# Patient Record
Sex: Female | Born: 1940
Health system: Southern US, Community
[De-identification: ages and names within clinical notes are randomized; demographics above are authoritative.]

## PROBLEM LIST (undated history)

## (undated) DIAGNOSIS — I700 Atherosclerosis of aorta: Secondary | ICD-10-CM

## (undated) DIAGNOSIS — N10 Acute tubulo-interstitial nephritis: Secondary | ICD-10-CM

## (undated) DIAGNOSIS — D649 Anemia, unspecified: Secondary | ICD-10-CM

## (undated) DIAGNOSIS — R161 Splenomegaly, not elsewhere classified: Secondary | ICD-10-CM

## (undated) DIAGNOSIS — I10 Essential (primary) hypertension: Secondary | ICD-10-CM

## (undated) DIAGNOSIS — Z79899 Other long term (current) drug therapy: Secondary | ICD-10-CM

## (undated) DIAGNOSIS — I493 Ventricular premature depolarization: Secondary | ICD-10-CM

## (undated) DIAGNOSIS — C801 Malignant (primary) neoplasm, unspecified: Secondary | ICD-10-CM

## (undated) DIAGNOSIS — K589 Irritable bowel syndrome without diarrhea: Secondary | ICD-10-CM

## (undated) DIAGNOSIS — I2510 Atherosclerotic heart disease of native coronary artery without angina pectoris: Secondary | ICD-10-CM

## (undated) DIAGNOSIS — I7123 Aneurysm of the descending thoracic aorta, without rupture: Secondary | ICD-10-CM

## (undated) DIAGNOSIS — E559 Vitamin D deficiency, unspecified: Secondary | ICD-10-CM

## (undated) DIAGNOSIS — I4891 Unspecified atrial fibrillation: Secondary | ICD-10-CM

## (undated) DIAGNOSIS — F329 Major depressive disorder, single episode, unspecified: Secondary | ICD-10-CM

## (undated) DIAGNOSIS — F32A Depression, unspecified: Secondary | ICD-10-CM

## (undated) DIAGNOSIS — M8580 Other specified disorders of bone density and structure, unspecified site: Secondary | ICD-10-CM

## (undated) DIAGNOSIS — E785 Hyperlipidemia, unspecified: Secondary | ICD-10-CM

## (undated) DIAGNOSIS — N200 Calculus of kidney: Secondary | ICD-10-CM

## (undated) DIAGNOSIS — M353 Polymyalgia rheumatica: Secondary | ICD-10-CM

## (undated) DIAGNOSIS — E039 Hypothyroidism, unspecified: Secondary | ICD-10-CM

## (undated) DIAGNOSIS — E876 Hypokalemia: Secondary | ICD-10-CM

## (undated) DIAGNOSIS — K760 Fatty (change of) liver, not elsewhere classified: Secondary | ICD-10-CM

## (undated) DIAGNOSIS — K219 Gastro-esophageal reflux disease without esophagitis: Secondary | ICD-10-CM

## (undated) DIAGNOSIS — M316 Other giant cell arteritis: Secondary | ICD-10-CM

## (undated) DIAGNOSIS — Z7901 Long term (current) use of anticoagulants: Secondary | ICD-10-CM

## (undated) DIAGNOSIS — C4490 Unspecified malignant neoplasm of skin, unspecified: Secondary | ICD-10-CM

## (undated) HISTORY — PX: COLONOSCOPY: SHX174

## (undated) HISTORY — DX: Essential (primary) hypertension: I10

## (undated) HISTORY — DX: Major depressive disorder, single episode, unspecified: F32.9

## (undated) HISTORY — DX: Hyperlipidemia, unspecified: E78.5

## (undated) HISTORY — PX: EYE SURGERY: SHX253

## (undated) HISTORY — DX: Gastro-esophageal reflux disease without esophagitis: K21.9

## (undated) HISTORY — PX: CATARACT EXTRACTION: SUR2

## (undated) HISTORY — DX: Depression, unspecified: F32.A

## (undated) HISTORY — PX: TONSILLECTOMY: SUR1361

---
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
## 1968-12-27 HISTORY — PX: WISDOM TOOTH EXTRACTION: SHX21

## 1971-03-28 HISTORY — PX: ABDOMINAL HYSTERECTOMY: SHX81

## 2003-08-14 LAB — HM PAP SMEAR: HM Pap smear: NORMAL

## 2006-09-01 ENCOUNTER — Ambulatory Visit: Payer: Self-pay | Admitting: Family Medicine

## 2006-11-02 ENCOUNTER — Ambulatory Visit: Payer: Self-pay | Admitting: Family Medicine

## 2006-11-08 ENCOUNTER — Ambulatory Visit: Payer: Self-pay | Admitting: Family Medicine

## 2008-02-06 ENCOUNTER — Ambulatory Visit: Payer: Self-pay | Admitting: Ophthalmology

## 2008-09-05 ENCOUNTER — Ambulatory Visit: Payer: Self-pay | Admitting: Family Medicine

## 2009-01-15 LAB — HM DEXA SCAN

## 2009-01-23 ENCOUNTER — Ambulatory Visit: Payer: Self-pay | Admitting: Family Medicine

## 2009-02-04 ENCOUNTER — Ambulatory Visit: Payer: Self-pay | Admitting: Gastroenterology

## 2009-02-04 LAB — HM COLONOSCOPY: HM Colonoscopy: NORMAL

## 2009-02-06 ENCOUNTER — Ambulatory Visit: Payer: Self-pay | Admitting: Family Medicine

## 2009-09-25 ENCOUNTER — Ambulatory Visit: Payer: Self-pay | Admitting: Family Medicine

## 2009-09-25 LAB — HM MAMMOGRAPHY

## 2009-11-25 ENCOUNTER — Ambulatory Visit: Payer: Self-pay | Admitting: Ophthalmology

## 2009-12-09 ENCOUNTER — Ambulatory Visit: Payer: Self-pay | Admitting: Ophthalmology

## 2011-02-09 ENCOUNTER — Ambulatory Visit: Payer: Self-pay | Admitting: Family Medicine

## 2012-03-08 ENCOUNTER — Ambulatory Visit: Payer: Self-pay | Admitting: Family Medicine

## 2012-03-08 DIAGNOSIS — E559 Vitamin D deficiency, unspecified: Secondary | ICD-10-CM | POA: Diagnosis not present

## 2012-03-08 DIAGNOSIS — K2100 Gastro-esophageal reflux disease with esophagitis, without bleeding: Secondary | ICD-10-CM | POA: Diagnosis not present

## 2012-03-08 DIAGNOSIS — M5430 Sciatica, unspecified side: Secondary | ICD-10-CM | POA: Diagnosis not present

## 2012-03-08 DIAGNOSIS — M549 Dorsalgia, unspecified: Secondary | ICD-10-CM | POA: Diagnosis not present

## 2012-03-08 DIAGNOSIS — M5137 Other intervertebral disc degeneration, lumbosacral region: Secondary | ICD-10-CM | POA: Diagnosis not present

## 2012-03-16 ENCOUNTER — Ambulatory Visit: Payer: Self-pay | Admitting: Family Medicine

## 2012-03-16 DIAGNOSIS — I4891 Unspecified atrial fibrillation: Secondary | ICD-10-CM | POA: Diagnosis not present

## 2012-03-16 DIAGNOSIS — M25559 Pain in unspecified hip: Secondary | ICD-10-CM | POA: Diagnosis not present

## 2012-03-16 DIAGNOSIS — M549 Dorsalgia, unspecified: Secondary | ICD-10-CM | POA: Diagnosis not present

## 2012-03-16 DIAGNOSIS — K2100 Gastro-esophageal reflux disease with esophagitis, without bleeding: Secondary | ICD-10-CM | POA: Diagnosis not present

## 2012-03-16 DIAGNOSIS — E559 Vitamin D deficiency, unspecified: Secondary | ICD-10-CM | POA: Diagnosis not present

## 2012-03-29 DIAGNOSIS — R1013 Epigastric pain: Secondary | ICD-10-CM | POA: Diagnosis not present

## 2012-03-29 DIAGNOSIS — I059 Rheumatic mitral valve disease, unspecified: Secondary | ICD-10-CM | POA: Diagnosis not present

## 2012-03-29 DIAGNOSIS — I4891 Unspecified atrial fibrillation: Secondary | ICD-10-CM | POA: Diagnosis not present

## 2012-03-29 DIAGNOSIS — K3189 Other diseases of stomach and duodenum: Secondary | ICD-10-CM | POA: Diagnosis not present

## 2012-04-03 DIAGNOSIS — R5383 Other fatigue: Secondary | ICD-10-CM | POA: Diagnosis not present

## 2012-04-03 DIAGNOSIS — R5381 Other malaise: Secondary | ICD-10-CM | POA: Diagnosis not present

## 2012-04-03 DIAGNOSIS — I4891 Unspecified atrial fibrillation: Secondary | ICD-10-CM | POA: Diagnosis not present

## 2012-04-03 DIAGNOSIS — I059 Rheumatic mitral valve disease, unspecified: Secondary | ICD-10-CM | POA: Diagnosis not present

## 2012-05-04 DIAGNOSIS — K3189 Other diseases of stomach and duodenum: Secondary | ICD-10-CM | POA: Diagnosis not present

## 2012-05-04 DIAGNOSIS — R1013 Epigastric pain: Secondary | ICD-10-CM | POA: Diagnosis not present

## 2012-05-04 DIAGNOSIS — R5383 Other fatigue: Secondary | ICD-10-CM | POA: Diagnosis not present

## 2012-05-04 DIAGNOSIS — I4891 Unspecified atrial fibrillation: Secondary | ICD-10-CM | POA: Diagnosis not present

## 2012-05-04 DIAGNOSIS — R5381 Other malaise: Secondary | ICD-10-CM | POA: Diagnosis not present

## 2012-05-04 DIAGNOSIS — E782 Mixed hyperlipidemia: Secondary | ICD-10-CM | POA: Diagnosis not present

## 2012-06-07 DIAGNOSIS — E782 Mixed hyperlipidemia: Secondary | ICD-10-CM | POA: Diagnosis not present

## 2012-06-07 DIAGNOSIS — I4891 Unspecified atrial fibrillation: Secondary | ICD-10-CM | POA: Diagnosis not present

## 2012-06-07 DIAGNOSIS — I059 Rheumatic mitral valve disease, unspecified: Secondary | ICD-10-CM | POA: Diagnosis not present

## 2012-09-07 DIAGNOSIS — D1801 Hemangioma of skin and subcutaneous tissue: Secondary | ICD-10-CM | POA: Diagnosis not present

## 2012-09-07 DIAGNOSIS — L570 Actinic keratosis: Secondary | ICD-10-CM | POA: Diagnosis not present

## 2012-10-04 DIAGNOSIS — E785 Hyperlipidemia, unspecified: Secondary | ICD-10-CM | POA: Diagnosis not present

## 2012-10-04 DIAGNOSIS — I4891 Unspecified atrial fibrillation: Secondary | ICD-10-CM | POA: Diagnosis not present

## 2012-11-16 DIAGNOSIS — M316 Other giant cell arteritis: Secondary | ICD-10-CM | POA: Diagnosis not present

## 2012-11-16 DIAGNOSIS — R5381 Other malaise: Secondary | ICD-10-CM | POA: Diagnosis not present

## 2012-11-16 DIAGNOSIS — Z23 Encounter for immunization: Secondary | ICD-10-CM | POA: Diagnosis not present

## 2012-11-16 DIAGNOSIS — R5383 Other fatigue: Secondary | ICD-10-CM | POA: Diagnosis not present

## 2012-11-16 DIAGNOSIS — M549 Dorsalgia, unspecified: Secondary | ICD-10-CM | POA: Diagnosis not present

## 2012-11-16 DIAGNOSIS — Z79899 Other long term (current) drug therapy: Secondary | ICD-10-CM | POA: Diagnosis not present

## 2012-11-16 DIAGNOSIS — E559 Vitamin D deficiency, unspecified: Secondary | ICD-10-CM | POA: Diagnosis not present

## 2013-01-18 DIAGNOSIS — G4700 Insomnia, unspecified: Secondary | ICD-10-CM | POA: Diagnosis not present

## 2013-01-18 DIAGNOSIS — F411 Generalized anxiety disorder: Secondary | ICD-10-CM | POA: Diagnosis not present

## 2013-01-18 DIAGNOSIS — F329 Major depressive disorder, single episode, unspecified: Secondary | ICD-10-CM | POA: Diagnosis not present

## 2013-01-18 DIAGNOSIS — E559 Vitamin D deficiency, unspecified: Secondary | ICD-10-CM | POA: Diagnosis not present

## 2013-01-18 DIAGNOSIS — F3289 Other specified depressive episodes: Secondary | ICD-10-CM | POA: Diagnosis not present

## 2013-04-04 DIAGNOSIS — R635 Abnormal weight gain: Secondary | ICD-10-CM | POA: Diagnosis not present

## 2013-04-04 DIAGNOSIS — I4891 Unspecified atrial fibrillation: Secondary | ICD-10-CM | POA: Diagnosis not present

## 2013-06-18 DIAGNOSIS — E039 Hypothyroidism, unspecified: Secondary | ICD-10-CM | POA: Diagnosis not present

## 2013-06-18 DIAGNOSIS — K2100 Gastro-esophageal reflux disease with esophagitis, without bleeding: Secondary | ICD-10-CM | POA: Diagnosis not present

## 2013-06-18 DIAGNOSIS — F3289 Other specified depressive episodes: Secondary | ICD-10-CM | POA: Diagnosis not present

## 2013-06-18 DIAGNOSIS — F329 Major depressive disorder, single episode, unspecified: Secondary | ICD-10-CM | POA: Diagnosis not present

## 2013-06-18 DIAGNOSIS — F411 Generalized anxiety disorder: Secondary | ICD-10-CM | POA: Diagnosis not present

## 2013-07-25 IMAGING — CR DG HIP COMPLETE 2+V*L*
1 series · 2 of 2 positions shown · non-contrast
Comparison: none

REASON FOR EXAM: left hip pain
COMMENTS:

PROCEDURE:     KDR - KDXR HIP LEFT COMPLETE  - March 16, 2012  [DATE]
RESULT:     Comparison:  None

[Series 1: ap · 0.17mm/px · 2 of 2 slices shown]
[im 1/2]
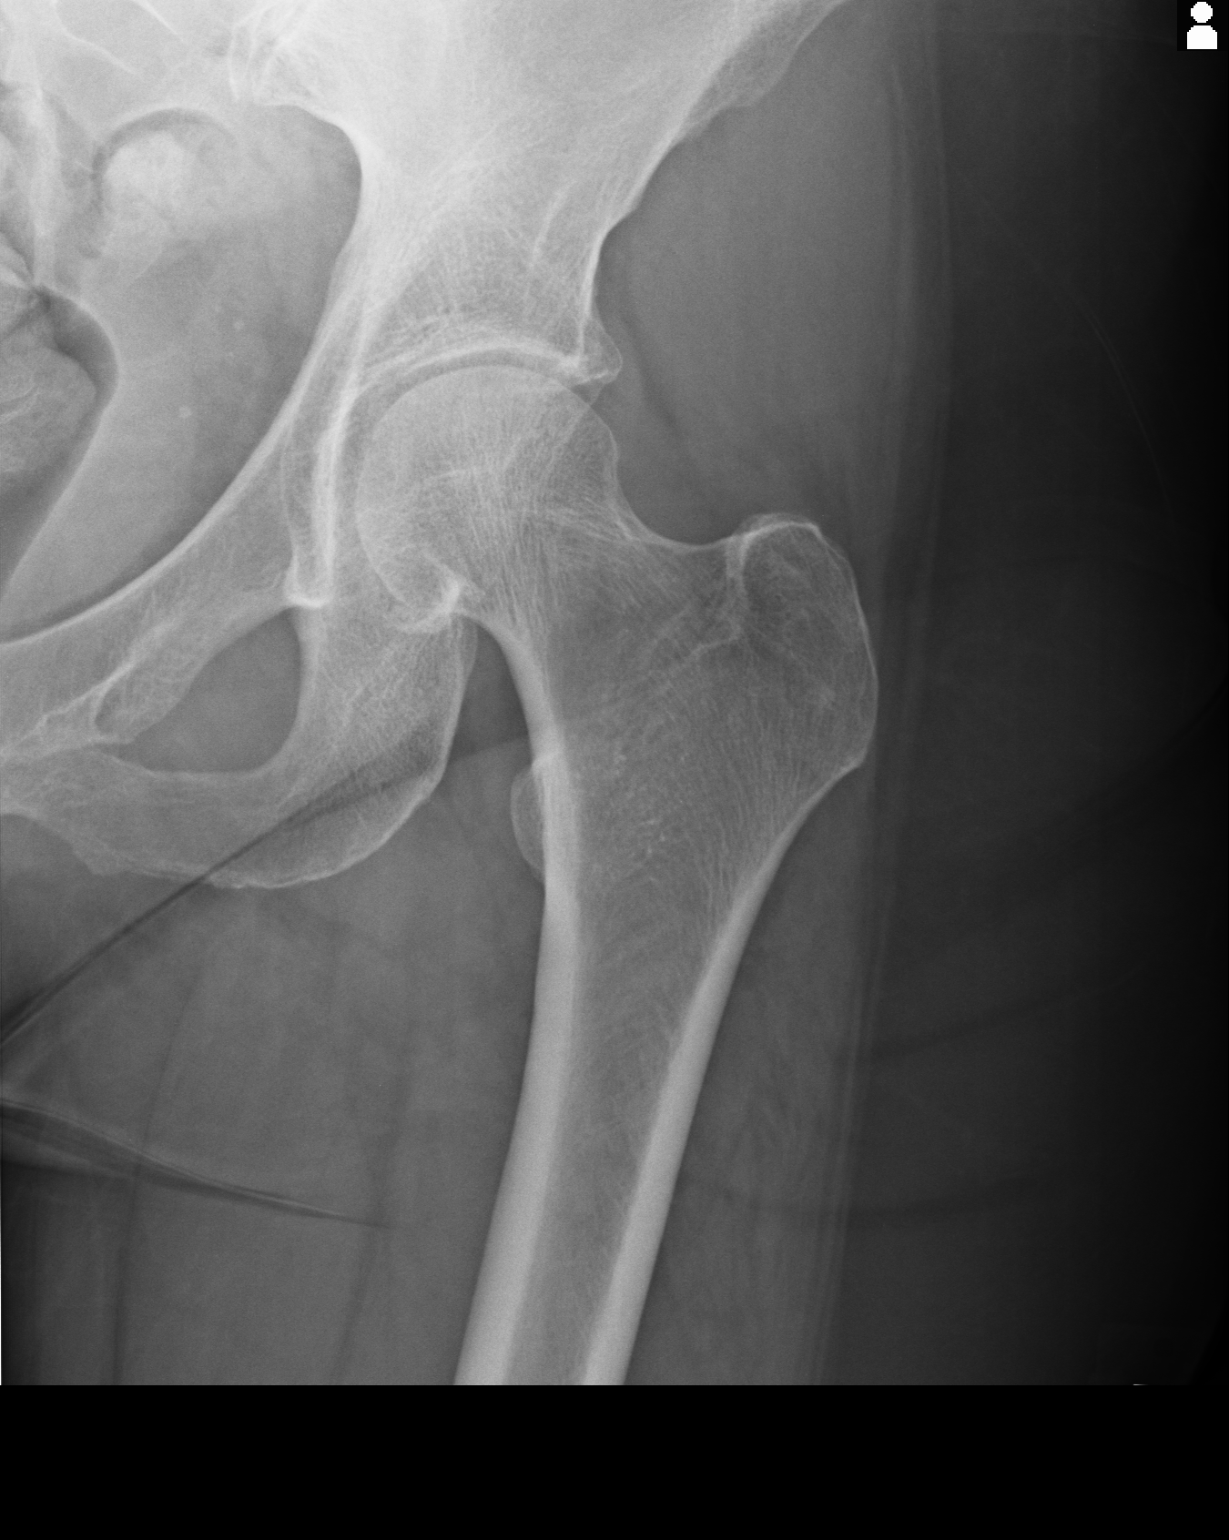
[im 2/2]
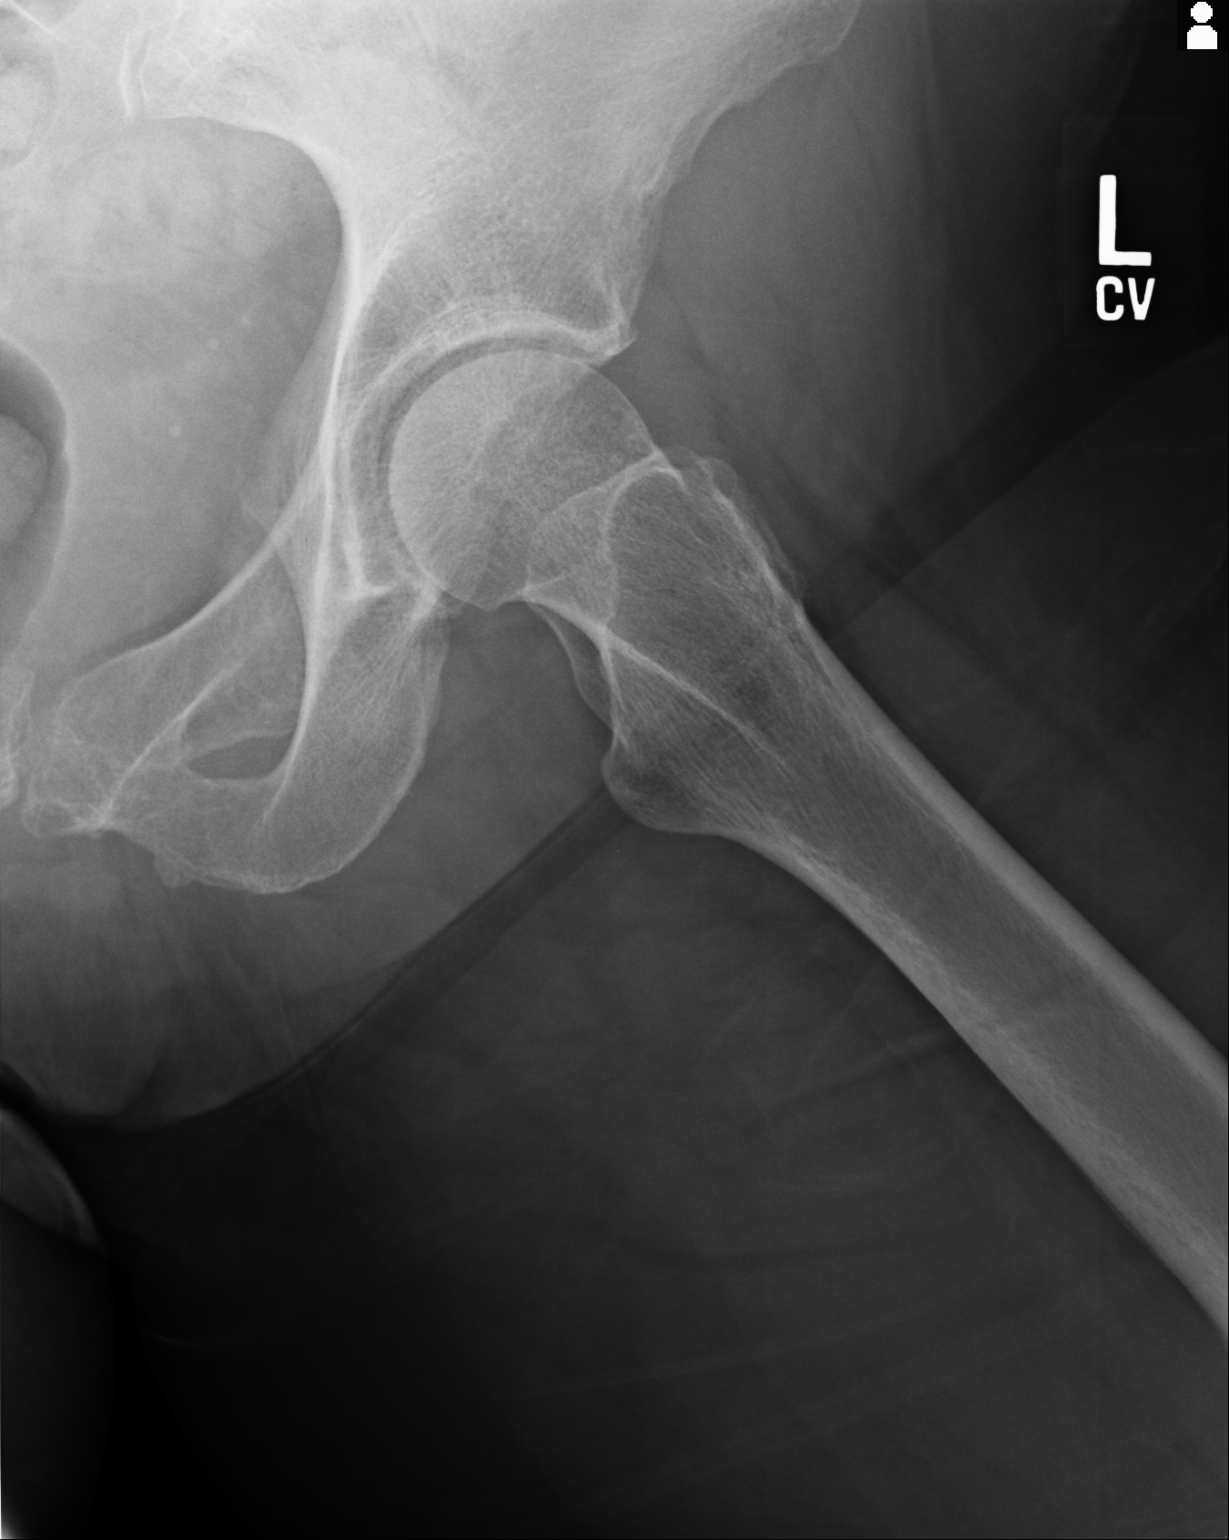

[2 of 2 positions shown; findings below may reference images not displayed]

FINDINGS: AP and frogleg lateral view of the left hip demonstrates no fracture or
dislocation. The joint space is maintained.
IMPRESSION: No acute osseous injury of the left hip.

## 2013-09-07 DIAGNOSIS — Z85828 Personal history of other malignant neoplasm of skin: Secondary | ICD-10-CM | POA: Diagnosis not present

## 2013-09-07 DIAGNOSIS — L570 Actinic keratosis: Secondary | ICD-10-CM | POA: Diagnosis not present

## 2013-09-28 DIAGNOSIS — Z23 Encounter for immunization: Secondary | ICD-10-CM | POA: Diagnosis not present

## 2013-10-30 ENCOUNTER — Ambulatory Visit: Payer: Self-pay | Admitting: Family Medicine

## 2013-10-30 DIAGNOSIS — Z1231 Encounter for screening mammogram for malignant neoplasm of breast: Secondary | ICD-10-CM | POA: Diagnosis not present

## 2013-11-21 DIAGNOSIS — E039 Hypothyroidism, unspecified: Secondary | ICD-10-CM | POA: Diagnosis not present

## 2013-11-21 DIAGNOSIS — Z0000 Encounter for general adult medical examination without abnormal findings: Secondary | ICD-10-CM | POA: Diagnosis not present

## 2013-11-21 DIAGNOSIS — Z1331 Encounter for screening for depression: Secondary | ICD-10-CM | POA: Diagnosis not present

## 2013-11-21 DIAGNOSIS — K2100 Gastro-esophageal reflux disease with esophagitis, without bleeding: Secondary | ICD-10-CM | POA: Diagnosis not present

## 2013-11-21 DIAGNOSIS — F329 Major depressive disorder, single episode, unspecified: Secondary | ICD-10-CM | POA: Diagnosis not present

## 2013-11-21 DIAGNOSIS — Z1339 Encounter for screening examination for other mental health and behavioral disorders: Secondary | ICD-10-CM | POA: Diagnosis not present

## 2013-11-21 DIAGNOSIS — F3289 Other specified depressive episodes: Secondary | ICD-10-CM | POA: Diagnosis not present

## 2014-02-07 DIAGNOSIS — E785 Hyperlipidemia, unspecified: Secondary | ICD-10-CM | POA: Diagnosis not present

## 2014-02-07 DIAGNOSIS — I4891 Unspecified atrial fibrillation: Secondary | ICD-10-CM | POA: Diagnosis not present

## 2014-02-25 DIAGNOSIS — R079 Chest pain, unspecified: Secondary | ICD-10-CM | POA: Diagnosis not present

## 2014-02-25 DIAGNOSIS — I10 Essential (primary) hypertension: Secondary | ICD-10-CM | POA: Diagnosis not present

## 2014-02-25 DIAGNOSIS — F320 Major depressive disorder, single episode, mild: Secondary | ICD-10-CM | POA: Diagnosis not present

## 2014-02-25 DIAGNOSIS — K2100 Gastro-esophageal reflux disease with esophagitis, without bleeding: Secondary | ICD-10-CM | POA: Diagnosis not present

## 2014-02-25 DIAGNOSIS — E039 Hypothyroidism, unspecified: Secondary | ICD-10-CM | POA: Diagnosis not present

## 2014-02-27 DIAGNOSIS — R5381 Other malaise: Secondary | ICD-10-CM | POA: Diagnosis not present

## 2014-02-27 DIAGNOSIS — E785 Hyperlipidemia, unspecified: Secondary | ICD-10-CM | POA: Diagnosis not present

## 2014-02-27 DIAGNOSIS — E559 Vitamin D deficiency, unspecified: Secondary | ICD-10-CM | POA: Diagnosis not present

## 2014-02-27 DIAGNOSIS — E039 Hypothyroidism, unspecified: Secondary | ICD-10-CM | POA: Diagnosis not present

## 2014-02-27 LAB — LIPID PANEL
Cholesterol: 238 mg/dL — AB (ref 0–200)
HDL: 65 mg/dL (ref 35–70)
LDL Cholesterol: 152 mg/dL
Triglycerides: 104 mg/dL (ref 40–160)

## 2014-02-27 LAB — HEPATIC FUNCTION PANEL
ALT: 11 U/L (ref 7–35)
AST: 16 U/L (ref 13–35)

## 2014-02-27 LAB — BASIC METABOLIC PANEL
BUN: 21 mg/dL (ref 4–21)
Creatinine: 1 mg/dL (ref <=1.1)
Glucose: 91 mg/dL
Potassium: 4.7 mmol/L (ref 3.4–5.3)
Sodium: 145 mmol/L (ref 137–147)

## 2014-06-13 DIAGNOSIS — E039 Hypothyroidism, unspecified: Secondary | ICD-10-CM | POA: Diagnosis not present

## 2014-06-13 DIAGNOSIS — I10 Essential (primary) hypertension: Secondary | ICD-10-CM | POA: Diagnosis not present

## 2014-06-13 DIAGNOSIS — R079 Chest pain, unspecified: Secondary | ICD-10-CM | POA: Diagnosis not present

## 2014-06-13 DIAGNOSIS — K2100 Gastro-esophageal reflux disease with esophagitis, without bleeding: Secondary | ICD-10-CM | POA: Diagnosis not present

## 2014-06-13 DIAGNOSIS — N390 Urinary tract infection, site not specified: Secondary | ICD-10-CM | POA: Diagnosis not present

## 2014-08-26 DIAGNOSIS — I4891 Unspecified atrial fibrillation: Secondary | ICD-10-CM | POA: Diagnosis not present

## 2014-08-26 DIAGNOSIS — R5381 Other malaise: Secondary | ICD-10-CM | POA: Diagnosis not present

## 2014-08-26 DIAGNOSIS — E039 Hypothyroidism, unspecified: Secondary | ICD-10-CM | POA: Diagnosis not present

## 2014-08-26 DIAGNOSIS — I10 Essential (primary) hypertension: Secondary | ICD-10-CM | POA: Diagnosis not present

## 2014-08-26 DIAGNOSIS — R5383 Other fatigue: Secondary | ICD-10-CM | POA: Diagnosis not present

## 2014-08-26 DIAGNOSIS — R51 Headache: Secondary | ICD-10-CM | POA: Diagnosis not present

## 2014-08-26 DIAGNOSIS — K2100 Gastro-esophageal reflux disease with esophagitis, without bleeding: Secondary | ICD-10-CM | POA: Diagnosis not present

## 2014-08-26 LAB — CBC AND DIFFERENTIAL
HCT: 41 % (ref 36–46)
Hemoglobin: 13.8 g/dL (ref 12.0–16.0)
Platelets: 188 10*3/uL (ref 150–399)
WBC: 6.4 10^3/mL

## 2014-08-26 LAB — TSH: TSH: 3.14 u[IU]/mL (ref <=5.90)

## 2014-09-06 DIAGNOSIS — C44519 Basal cell carcinoma of skin of other part of trunk: Secondary | ICD-10-CM | POA: Diagnosis not present

## 2014-09-06 DIAGNOSIS — C44611 Basal cell carcinoma of skin of unspecified upper limb, including shoulder: Secondary | ICD-10-CM | POA: Diagnosis not present

## 2014-09-06 DIAGNOSIS — Z85828 Personal history of other malignant neoplasm of skin: Secondary | ICD-10-CM | POA: Diagnosis not present

## 2014-09-06 DIAGNOSIS — D485 Neoplasm of uncertain behavior of skin: Secondary | ICD-10-CM | POA: Diagnosis not present

## 2014-09-06 DIAGNOSIS — C44319 Basal cell carcinoma of skin of other parts of face: Secondary | ICD-10-CM | POA: Diagnosis not present

## 2014-10-01 DIAGNOSIS — Z23 Encounter for immunization: Secondary | ICD-10-CM | POA: Diagnosis not present

## 2014-10-02 DIAGNOSIS — C44619 Basal cell carcinoma of skin of left upper limb, including shoulder: Secondary | ICD-10-CM | POA: Diagnosis not present

## 2014-10-17 DIAGNOSIS — C44519 Basal cell carcinoma of skin of other part of trunk: Secondary | ICD-10-CM | POA: Diagnosis not present

## 2014-11-15 ENCOUNTER — Ambulatory Visit: Payer: Self-pay | Admitting: Family Medicine

## 2014-11-15 DIAGNOSIS — Z1231 Encounter for screening mammogram for malignant neoplasm of breast: Secondary | ICD-10-CM | POA: Diagnosis not present

## 2014-12-05 DIAGNOSIS — C44319 Basal cell carcinoma of skin of other parts of face: Secondary | ICD-10-CM | POA: Diagnosis not present

## 2014-12-25 DIAGNOSIS — I10 Essential (primary) hypertension: Secondary | ICD-10-CM | POA: Diagnosis not present

## 2014-12-25 DIAGNOSIS — I480 Paroxysmal atrial fibrillation: Secondary | ICD-10-CM | POA: Diagnosis not present

## 2015-03-03 ENCOUNTER — Ambulatory Visit: Payer: Self-pay | Admitting: Family Medicine

## 2015-03-03 DIAGNOSIS — R0781 Pleurodynia: Secondary | ICD-10-CM | POA: Diagnosis not present

## 2015-03-03 DIAGNOSIS — E039 Hypothyroidism, unspecified: Secondary | ICD-10-CM | POA: Diagnosis not present

## 2015-03-03 DIAGNOSIS — R0789 Other chest pain: Secondary | ICD-10-CM | POA: Diagnosis not present

## 2015-03-03 DIAGNOSIS — B373 Candidiasis of vulva and vagina: Secondary | ICD-10-CM | POA: Diagnosis not present

## 2015-03-03 DIAGNOSIS — K219 Gastro-esophageal reflux disease without esophagitis: Secondary | ICD-10-CM | POA: Diagnosis not present

## 2015-05-02 DIAGNOSIS — X32XXXA Exposure to sunlight, initial encounter: Secondary | ICD-10-CM | POA: Diagnosis not present

## 2015-05-02 DIAGNOSIS — L821 Other seborrheic keratosis: Secondary | ICD-10-CM | POA: Diagnosis not present

## 2015-05-02 DIAGNOSIS — L570 Actinic keratosis: Secondary | ICD-10-CM | POA: Diagnosis not present

## 2015-05-02 DIAGNOSIS — Z85828 Personal history of other malignant neoplasm of skin: Secondary | ICD-10-CM | POA: Diagnosis not present

## 2015-08-27 DIAGNOSIS — R51 Headache: Secondary | ICD-10-CM

## 2015-08-27 DIAGNOSIS — F329 Major depressive disorder, single episode, unspecified: Secondary | ICD-10-CM | POA: Insufficient documentation

## 2015-08-27 DIAGNOSIS — R519 Headache, unspecified: Secondary | ICD-10-CM | POA: Insufficient documentation

## 2015-08-27 DIAGNOSIS — M316 Other giant cell arteritis: Secondary | ICD-10-CM | POA: Insufficient documentation

## 2015-08-27 DIAGNOSIS — F320 Major depressive disorder, single episode, mild: Secondary | ICD-10-CM | POA: Insufficient documentation

## 2015-08-27 DIAGNOSIS — E559 Vitamin D deficiency, unspecified: Secondary | ICD-10-CM | POA: Insufficient documentation

## 2015-08-27 DIAGNOSIS — E039 Hypothyroidism, unspecified: Secondary | ICD-10-CM | POA: Insufficient documentation

## 2015-08-27 DIAGNOSIS — M353 Polymyalgia rheumatica: Secondary | ICD-10-CM | POA: Insufficient documentation

## 2015-08-27 DIAGNOSIS — K219 Gastro-esophageal reflux disease without esophagitis: Secondary | ICD-10-CM | POA: Insufficient documentation

## 2015-08-27 DIAGNOSIS — R5381 Other malaise: Secondary | ICD-10-CM | POA: Insufficient documentation

## 2015-08-27 DIAGNOSIS — R030 Elevated blood-pressure reading, without diagnosis of hypertension: Secondary | ICD-10-CM

## 2015-08-27 DIAGNOSIS — F32A Depression, unspecified: Secondary | ICD-10-CM | POA: Insufficient documentation

## 2015-08-27 DIAGNOSIS — R5383 Other fatigue: Secondary | ICD-10-CM | POA: Insufficient documentation

## 2015-08-27 DIAGNOSIS — Z79899 Other long term (current) drug therapy: Secondary | ICD-10-CM | POA: Insufficient documentation

## 2015-08-27 DIAGNOSIS — K2100 Gastro-esophageal reflux disease with esophagitis, without bleeding: Secondary | ICD-10-CM | POA: Insufficient documentation

## 2015-08-27 DIAGNOSIS — K589 Irritable bowel syndrome without diarrhea: Secondary | ICD-10-CM | POA: Insufficient documentation

## 2015-08-27 DIAGNOSIS — R0789 Other chest pain: Secondary | ICD-10-CM | POA: Insufficient documentation

## 2015-08-27 DIAGNOSIS — E785 Hyperlipidemia, unspecified: Secondary | ICD-10-CM | POA: Insufficient documentation

## 2015-08-27 DIAGNOSIS — IMO0001 Reserved for inherently not codable concepts without codable children: Secondary | ICD-10-CM | POA: Insufficient documentation

## 2015-08-27 DIAGNOSIS — I4891 Unspecified atrial fibrillation: Secondary | ICD-10-CM | POA: Insufficient documentation

## 2015-09-02 ENCOUNTER — Encounter: Payer: Self-pay | Admitting: Family Medicine

## 2015-09-03 DIAGNOSIS — R002 Palpitations: Secondary | ICD-10-CM | POA: Diagnosis not present

## 2015-09-03 DIAGNOSIS — I4891 Unspecified atrial fibrillation: Secondary | ICD-10-CM | POA: Diagnosis not present

## 2015-09-03 DIAGNOSIS — E782 Mixed hyperlipidemia: Secondary | ICD-10-CM | POA: Diagnosis not present

## 2015-10-04 ENCOUNTER — Ambulatory Visit (INDEPENDENT_AMBULATORY_CARE_PROVIDER_SITE_OTHER): Payer: Medicare Other

## 2015-10-04 DIAGNOSIS — Z23 Encounter for immunization: Secondary | ICD-10-CM

## 2015-10-06 ENCOUNTER — Other Ambulatory Visit: Payer: Self-pay | Admitting: Family Medicine

## 2015-12-04 ENCOUNTER — Other Ambulatory Visit: Payer: Self-pay | Admitting: Family Medicine

## 2016-02-16 ENCOUNTER — Other Ambulatory Visit: Payer: Self-pay | Admitting: Family Medicine

## 2016-07-28 DIAGNOSIS — L570 Actinic keratosis: Secondary | ICD-10-CM | POA: Diagnosis not present

## 2016-07-28 DIAGNOSIS — D485 Neoplasm of uncertain behavior of skin: Secondary | ICD-10-CM | POA: Diagnosis not present

## 2016-07-28 DIAGNOSIS — Z08 Encounter for follow-up examination after completed treatment for malignant neoplasm: Secondary | ICD-10-CM | POA: Diagnosis not present

## 2016-07-28 DIAGNOSIS — X32XXXA Exposure to sunlight, initial encounter: Secondary | ICD-10-CM | POA: Diagnosis not present

## 2016-07-28 DIAGNOSIS — C44212 Basal cell carcinoma of skin of right ear and external auricular canal: Secondary | ICD-10-CM | POA: Diagnosis not present

## 2016-07-28 DIAGNOSIS — Z85828 Personal history of other malignant neoplasm of skin: Secondary | ICD-10-CM | POA: Diagnosis not present

## 2016-09-22 DIAGNOSIS — I480 Paroxysmal atrial fibrillation: Secondary | ICD-10-CM | POA: Diagnosis not present

## 2016-09-22 DIAGNOSIS — I10 Essential (primary) hypertension: Secondary | ICD-10-CM | POA: Diagnosis not present

## 2016-09-22 DIAGNOSIS — K219 Gastro-esophageal reflux disease without esophagitis: Secondary | ICD-10-CM | POA: Diagnosis not present

## 2016-09-22 DIAGNOSIS — E782 Mixed hyperlipidemia: Secondary | ICD-10-CM | POA: Diagnosis not present

## 2016-09-27 DIAGNOSIS — L908 Other atrophic disorders of skin: Secondary | ICD-10-CM | POA: Diagnosis not present

## 2016-09-27 DIAGNOSIS — L814 Other melanin hyperpigmentation: Secondary | ICD-10-CM | POA: Diagnosis not present

## 2016-09-27 DIAGNOSIS — C44311 Basal cell carcinoma of skin of nose: Secondary | ICD-10-CM | POA: Diagnosis not present

## 2016-09-27 DIAGNOSIS — L578 Other skin changes due to chronic exposure to nonionizing radiation: Secondary | ICD-10-CM | POA: Diagnosis not present

## 2016-09-29 ENCOUNTER — Ambulatory Visit (INDEPENDENT_AMBULATORY_CARE_PROVIDER_SITE_OTHER): Payer: Medicare Other | Admitting: Family Medicine

## 2016-09-29 ENCOUNTER — Encounter: Payer: Self-pay | Admitting: Family Medicine

## 2016-09-29 VITALS — BP 124/62 | HR 74 | Temp 97.8°F | Resp 18 | Wt 171.0 lb

## 2016-09-29 DIAGNOSIS — R05 Cough: Secondary | ICD-10-CM | POA: Diagnosis not present

## 2016-09-29 DIAGNOSIS — R059 Cough, unspecified: Secondary | ICD-10-CM

## 2016-09-29 MED ORDER — PROMETHAZINE-DM 6.25-15 MG/5ML PO SYRP: 5.0000 mL | ORAL_SOLUTION | Freq: Four times a day (QID) | ORAL | 0 refills | Status: DC | PRN

## 2016-09-29 MED ORDER — DOXYCYCLINE MONOHYDRATE 100 MG PO CAPS: 100.0000 mg | ORAL_CAPSULE | Freq: Two times a day (BID) | ORAL | 0 refills | Status: DC

## 2016-09-29 NOTE — Progress Notes (Signed)
Tara Marquez  MRN: [id number] DOB: 04/12/1941  Subjective:  HPI  Patient has not felt good since September 28th last week. Symptoms started with sore throat which resolved now, productive cough which is very dry today. She has taking Delsym at bedtime and has been able to sleep at night with this regimen. She is also having some shortness of breath, some joint aches, nausea, runny nose, post nasal drainage, headache and some sinus pressure. No fever. She has not taking anything else for her symptoms.  Patient Active Problem List   Diagnosis Date Noted  . Giant cell arteritis ([place]) 08/27/2015  . A-fib ([place]) 08/27/2015  . Chest wall tenderness 08/27/2015  . Clinical depression 08/27/2015  . Blood pressure elevated 08/27/2015  . Polypharmacy 08/27/2015  . Esophagitis, reflux 08/27/2015  . Fatigue 08/27/2015  . Acid reflux 08/27/2015  . Cephalalgia 08/27/2015  . HLD (hyperlipidemia) 08/27/2015  . Adult hypothyroidism 08/27/2015  . Adaptive colitis 08/27/2015  . Malaise and fatigue 08/27/2015  . Mild major depression ([place]) 08/27/2015  . Polymyalgia rheumatica ([place]) 08/27/2015  . Cranial arteritis ([place]) 08/27/2015  . Avitaminosis D 08/27/2015    Past Medical History:  Diagnosis Date  . Depression    mild major depression  . GERD (gastroesophageal reflux disease)   . Hyperlipidemia     Social History   Social History  . Marital status: Married    Spouse name: N/A  . Number of children: N/A  . Years of education: N/A   Occupational History  . Not on file.   Social History Main Topics  . Smoking status: Never Smoker  . Smokeless tobacco: Never Used  . Alcohol use No  . Drug use: No  . Sexual activity: Not on file   Other Topics Concern  . Not on file   Social History Narrative  . No narrative on file    Outpatient Encounter Prescriptions as of 09/29/2016  Medication Sig Note  . aspirin EC 325 MG tablet Take 1 tablet by mouth daily. 08/27/2015: Per Dr. Nehemiah Massed  Received from: [place]: Take by mouth.  . levothyroxine (SYNTHROID, LEVOTHROID) 50 MCG tablet TAKE 1 TABLET EVERY DAY   . metoprolol tartrate (LOPRESSOR) 25 MG tablet Take 1 tablet by mouth as needed. 08/27/2015: Medication taken as needed.  Received from: [place]: Take by mouth.  . sertraline (ZOLOFT) 100 MG tablet TAKE 1 AND 1/2 TABLETS EVERY DAY   . terconazole (TERAZOL 7) 0.4 % vaginal cream Place vaginally at bedtime. 1(one) Cream every night at bedtime 08/27/2015: Received from: [facility]: Place vaginally.  . [DISCONTINUED] Vitamin D, Cholecalciferol, 1000 UNITS CAPS Take 1 capsule by mouth daily. 08/27/2015: Received from: [facility]: Take by mouth.   No facility-administered encounter medications on file as of 09/29/2016.     Allergies  Allergen Reactions  . Sulfa Antibiotics Hives  . Amoxicillin Rash  . Penicillins Rash    Review of Systems  Constitutional: Positive for malaise/fatigue.  HENT: Positive for congestion.   Respiratory: Positive for cough, sputum production and shortness of breath.   Cardiovascular: Negative.   Gastrointestinal: Positive for nausea.  Musculoskeletal: Positive for joint pain.  Neurological: Positive for headaches.  Endo/Heme/Allergies: Negative.   Psychiatric/Behavioral: Negative.    Objective:  BP 124/62   Pulse 74   Temp 97.8 F (36.6 C)   Resp 18   Wt 171 lb (77.6 kg)   SpO2 98%  Physical Exam  Constitutional: She is oriented to person, place, and time and well-developed, well-nourished, and in no distress.  HENT:  Head: Normocephalic and atraumatic.  Right Ear: External ear normal.  Left Ear: External ear normal.  Nose: Nose normal.  Mouth/Throat: Oropharynx is clear and moist.  Eyes: Conjunctivae are normal. No scleral icterus.  Neck: Neck supple. No thyromegaly present.  Cardiovascular: Normal  rate, regular rhythm, normal heart sounds and intact distal pulses.   No murmur heard. Pulmonary/Chest: Effort normal and breath sounds normal. No respiratory distress. She has no wheezes.  Abdominal: Soft.  Lymphadenopathy:    She has no cervical adenopathy.  Neurological: She is alert and oriented to person, place, and time.  Skin: Skin is warm and dry.  Psychiatric: Mood, memory, affect and judgment normal.    Assessment and Plan :  1. Cough Will provide Promethazine DM cough syrup. Provided RX for Doxy in case symptoms get worse. I think this may be viral. Follow as needed. Increase fluids. 2. URI  HPI, Exam and A&P transcribed under direction and in the presence of Miguel Aschoff, MD. I have done the exam and reviewed the chart and it is accurate to the best of my knowledge. Miguel Aschoff M.D. [facility]

## 2016-10-04 ENCOUNTER — Telehealth: Payer: Self-pay | Admitting: Family Medicine

## 2016-10-04 NOTE — Telephone Encounter (Signed)
LVM to schedule AWV and CPE with Dr. Rosanna Randy 10/17 at 10:30am

## 2016-10-08 ENCOUNTER — Other Ambulatory Visit: Payer: Self-pay | Admitting: Family Medicine

## 2016-10-14 ENCOUNTER — Ambulatory Visit (INDEPENDENT_AMBULATORY_CARE_PROVIDER_SITE_OTHER): Payer: Medicare Other

## 2016-10-14 DIAGNOSIS — Z23 Encounter for immunization: Secondary | ICD-10-CM | POA: Diagnosis not present

## 2016-10-14 NOTE — Addendum Note (Signed)
Addended by: Julieta Bellini on: 10/14/2016 04:27 PM   Modules accepted: Orders

## 2016-10-19 ENCOUNTER — Other Ambulatory Visit: Payer: Self-pay | Admitting: Family Medicine

## 2016-10-20 NOTE — Telephone Encounter (Signed)
Has appointment 12/09/2016. Renaldo Fiddler, CMA

## 2016-12-09 ENCOUNTER — Encounter: Payer: Self-pay | Admitting: Family Medicine

## 2016-12-09 ENCOUNTER — Ambulatory Visit (INDEPENDENT_AMBULATORY_CARE_PROVIDER_SITE_OTHER): Payer: Medicare Other | Admitting: Family Medicine

## 2016-12-09 VITALS — BP 136/60 | HR 72 | Temp 97.5°F | Resp 16 | Ht 64.0 in | Wt 170.0 lb

## 2016-12-09 DIAGNOSIS — E785 Hyperlipidemia, unspecified: Secondary | ICD-10-CM | POA: Diagnosis not present

## 2016-12-09 DIAGNOSIS — N3281 Overactive bladder: Secondary | ICD-10-CM | POA: Diagnosis not present

## 2016-12-09 DIAGNOSIS — E039 Hypothyroidism, unspecified: Secondary | ICD-10-CM | POA: Diagnosis not present

## 2016-12-09 DIAGNOSIS — I4891 Unspecified atrial fibrillation: Secondary | ICD-10-CM | POA: Diagnosis not present

## 2016-12-09 MED ORDER — OXYBUTYNIN CHLORIDE 5 MG PO TABS: 5.0000 mg | ORAL_TABLET | Freq: Two times a day (BID) | ORAL | 12 refills | Status: DC

## 2016-12-09 NOTE — Progress Notes (Signed)
Patient: Tara Marquez, Female    DOB: Oct 18, 1941, 75 y.o.   MRN: [id number] Visit Date: 12/09/2016  Today's Provider: [name], MD   Chief Complaint  Patient presents with  . Annual Exam   Subjective:   Tara Marquez is a 75 y.o. female who presents today for her Subsequent Annual Wellness Visit. She feels well. She reports exercising none. She reports she is sleeping well. Pt ok but she is Stressed as husband has metastatic prostate cancer and has gallbladder surgery scheduled for December 20. Overall everything is okay but she is bothered by the timing of the surgery. She does have chronic urinary issues with mainly urge incontinence.  Immunization History  Administered Date(s) Administered  . Influenza, High Dose Seasonal PF 10/04/2015, 10/14/2016  . Influenza-Unspecified 09/26/2014  . Pneumococcal Conjugate-13 10/14/2016  . Pneumococcal Polysaccharide-23 10/12/2008   11/15/2014 Mammogram 02/04/2009 Colonoscopy-normal, repeat in 10 years 01/15/2009 BMD- 08/14/2003 Pap, S/P hysterectomy  08/26/2014 TSH, CBC, Met C, Lipids  Review of Systems  Constitutional: Negative.   HENT: Negative.   Eyes: Negative.   Respiratory: Negative.   Cardiovascular: Positive for palpitations.  Gastrointestinal: Negative.   Endocrine: Negative.   Genitourinary: Positive for enuresis.  Musculoskeletal: Negative.   Skin: Negative.   Allergic/Immunologic: Negative.   Neurological: Negative.   Hematological: Negative.   Psychiatric/Behavioral: Negative.     Patient Active Problem List   Diagnosis Date Noted  . Giant cell arteritis ([place]) 08/27/2015  . A-fib ([place]) 08/27/2015  . Chest wall tenderness 08/27/2015  . Clinical depression 08/27/2015  . Blood pressure elevated 08/27/2015  . Polypharmacy 08/27/2015  . Esophagitis, reflux 08/27/2015  . Fatigue 08/27/2015  . Acid reflux 08/27/2015  . Cephalalgia 08/27/2015  . HLD (hyperlipidemia) 08/27/2015  . Adult hypothyroidism  08/27/2015  . Adaptive colitis 08/27/2015  . Malaise and fatigue 08/27/2015  . Mild major depression ([place]) 08/27/2015  . Polymyalgia rheumatica ([place]) 08/27/2015  . Cranial arteritis ([place]) 08/27/2015  . Avitaminosis D 08/27/2015    Social History   Social History  . Marital status: Married    Spouse name: N/A  . Number of children: N/A  . Years of education: N/A   Occupational History  . Not on file.   Social History Main Topics  . Smoking status: Never Smoker  . Smokeless tobacco: Never Used  . Alcohol use No  . Drug use: No  . Sexual activity: Not on file   Other Topics Concern  . Not on file   Social History Narrative  . No narrative on file    Past Surgical History:  Procedure Laterality Date  . ABDOMINAL HYSTERECTOMY  03/1971   Status Post Hysterectomy  . APPENDECTOMY  1972  . CATARACT EXTRACTION Left   . TONSILLECTOMY AND ADENOIDECTOMY  1951  . [place]    Her family history includes Cancer in her sister; Congenital heart disease in her son.     Outpatient Encounter Prescriptions as of 12/09/2016  Medication Sig Note  . aspirin EC 325 MG tablet Take 1 tablet by mouth daily. 08/27/2015: Per Dr. Nehemiah Massed Received from: [place]: Take by mouth.  . levothyroxine (SYNTHROID, LEVOTHROID) 50 MCG tablet TAKE 1 TABLET EVERY DAY   . metoprolol tartrate (LOPRESSOR) 25 MG tablet Take 1 tablet by mouth as needed. 08/27/2015: Medication taken as needed.  Received from: [place]: Take by mouth.  . sertraline (ZOLOFT) 100 MG tablet TAKE 1 AND 1/2 TABLETS EVERY  DAY (NEED TO MAKE APPOINTMENT)   . [DISCONTINUED] doxycycline (MONODOX) 100 MG capsule Take 1 capsule (100 mg total) by mouth 2 (two) times daily.   . [DISCONTINUED] promethazine-dextromethorphan (PROMETHAZINE-DM) 6.25-15 MG/5ML syrup TAKE 5 ML 4 TIMES DAILY AS NEEDED FOR COUGH.   . [DISCONTINUED] terconazole (TERAZOL 7) 0.4 %  vaginal cream Place vaginally at bedtime. 1(one) Cream every night at bedtime 08/27/2015: Received from: [facility]: Place vaginally.   No facility-administered encounter medications on file as of 12/09/2016.     Allergies  Allergen Reactions  . Sulfa Antibiotics Hives  . Amoxicillin Rash  . Penicillins Rash    Patient Care Team: Jerrol Banana., MD as PCP - General (Family Medicine)   Objective:   Vitals:  Vitals:   12/09/16 1052  BP: 136/60  Pulse: 72  Resp: 16  Temp: 97.5 F (36.4 C)  TempSrc: Oral  Weight: 170 lb (77.1 kg)  Height: '5\' 4"'$  (1.626 m)    Physical Exam  Constitutional: She is oriented to person, place, and time. She appears well-developed and well-nourished.  HENT:  Head: Normocephalic and atraumatic.  Right Ear: External ear normal.  Left Ear: External ear normal.  Nose: Nose normal.  Mouth/Throat: Oropharynx is clear and moist.  Eyes: Conjunctivae and EOM are normal. Pupils are equal, round, and reactive to light.  Neck: Normal range of motion. Neck supple.  Cardiovascular: Normal rate, regular rhythm, normal heart sounds and intact distal pulses.   Pulmonary/Chest: Effort normal and breath sounds normal.  Abdominal: Soft. Bowel sounds are normal.  Genitourinary:  Genitourinary Comments: Patient declined rectal exam  Musculoskeletal: Normal range of motion.  Neurological: She is alert and oriented to person, place, and time.  Skin: Skin is warm and dry.  Psychiatric: She has a normal mood and affect. Her behavior is normal. Judgment and thought content normal.    Activities of Daily Living In your present state of health, do you have any difficulty performing the following activities: 12/09/2016  Hearing? N  Vision? N  Difficulty concentrating or making decisions? N  Walking or climbing stairs? N  Dressing or bathing? N  Doing errands, shopping? N  Some recent data might be hidden    Fall Risk  Assessment Fall Risk  12/09/2016 12/19/2015  Falls in the past year? Yes Yes  Number falls in past yr: 1 2 or more  Injury with Fall? No No     Depression Screen PHQ 2/9 Scores 12/09/2016  PHQ - 2 Score 1   Current Exercise Habits: The patient does not participate in regular exercise at present    Cognitive Testing - 6-CIT    Year: 0 4 points  Month: 0 3 points  Memorize "Pia Mau, [address], [place]"  Time (within 1 hour:) 0 3 points  Count backwards from 20: 0 2 4 points  Name months of year: 0 2 4 points  Repeat Address: 0 '2 4 6 8 10 '$ points   Total Score: 0/28  Interpretation : Normal (0-7) Abnormal (8-28)    Assessment & Plan:     Annual Wellness Visit  Reviewed patient's Family Medical History Reviewed and updated list of patient's medical providers Assessment of cognitive impairment was done Assessed patient's functional ability Established a written schedule for health screening [place] Completed and Reviewed  Exercise Activities and Dietary recommendations Goals    None      Immunization History  Administered Date(s) Administered  . Influenza, High  Dose Seasonal PF 10/04/2015, 10/14/2016  . Influenza-Unspecified 09/26/2014  . Pneumococcal Conjugate-13 10/14/2016  . Pneumococcal Polysaccharide-23 10/12/2008    Health Maintenance  Topic Date Due  . TETANUS/TDAP  03/03/1961  . ZOSTAVAX  03/03/2002  . MAMMOGRAM  09/26/2011  . COLONOSCOPY  02/04/2019  . INFLUENZA VACCINE  Completed  . DEXA SCAN  Completed  . PNA vac Low Risk Adult  Completed     Discussed health benefits of physical activity, and encouraged her to engage in regular exercise appropriate for her age and condition.    1. Atrial fibrillation, unspecified type ([place])  - CBC with Differential/Platelet - Comprehensive metabolic panel  2. Adult hypothyroidism  - TSH  3. Hyperlipidemia, unspecified hyperlipidemia type  - Lipid Panel With LDL/HDL  Ratio  4. Overactive bladder/OAB  - oxybutynin (DITROPAN) 5 MG tablet; Take 1 tablet (5 mg total) by mouth 2 (two) times daily.  Dispense: 60 tablet; Refill: 12   HPI, Exam and A&P Transcribed under the direction and in the presence of Miguel Aschoff, Brooke Bonito., MD. Electronically Signed: Althea Charon, RMA I have done the exam and reviewed the chart and it is accurate to the best of my knowledge. Development worker, community has been used and  any errors in dictation or transcription are unintentional. Miguel Aschoff M.D. [facility]

## 2016-12-10 LAB — COMPREHENSIVE METABOLIC PANEL
ALT: 19 IU/L (ref 0–32)
AST: 21 IU/L (ref 0–40)
Albumin/Globulin Ratio: 1.7 (ref 1.2–2.2)
Albumin: 4.3 g/dL (ref 3.5–4.8)
Alkaline Phosphatase: 106 IU/L (ref 39–117)
BUN/Creatinine Ratio: 20 (ref 12–28)
BUN: 17 mg/dL (ref 8–27)
Bilirubin Total: 0.6 mg/dL (ref 0.0–1.2)
CO2: 25 mmol/L (ref 18–29)
Calcium: 9.3 mg/dL (ref 8.7–10.3)
Chloride: 102 mmol/L (ref 96–106)
Creatinine, Ser: 0.85 mg/dL (ref 0.57–1.00)
GFR calc Af Amer: 78 mL/min/{1.73_m2} (ref >59)
GFR calc non Af Amer: 68 mL/min/{1.73_m2} (ref >59)
Globulin, Total: 2.6 g/dL (ref 1.5–4.5)
Glucose: 102 mg/dL — ABNORMAL HIGH (ref 65–99)
Potassium: 4.7 mmol/L (ref 3.5–5.2)
Sodium: 142 mmol/L (ref 134–144)
Total Protein: 6.9 g/dL (ref 6.0–8.5)

## 2016-12-10 LAB — CBC WITH DIFFERENTIAL/PLATELET
Basophils Absolute: 0 10*3/uL (ref 0.0–0.2)
Basos: 1 %
EOS (ABSOLUTE): 0.1 10*3/uL (ref 0.0–0.4)
Eos: 2 %
Hematocrit: 42 % (ref 34.0–46.6)
Hemoglobin: 13.6 g/dL (ref 11.1–15.9)
Immature Grans (Abs): 0 10*3/uL (ref 0.0–0.1)
Immature Granulocytes: 0 %
Lymphocytes Absolute: 1.8 10*3/uL (ref 0.7–3.1)
Lymphs: 29 %
MCH: 27.5 pg (ref 26.6–33.0)
MCHC: 32.4 g/dL (ref 31.5–35.7)
MCV: 85 fL (ref 79–97)
Monocytes Absolute: 0.4 10*3/uL (ref 0.1–0.9)
Monocytes: 7 %
Neutrophils Absolute: 3.8 10*3/uL (ref 1.4–7.0)
Neutrophils: 61 %
Platelets: 199 10*3/uL (ref 150–379)
RBC: 4.94 x10E6/uL (ref 3.77–5.28)
RDW: 15.1 % (ref 12.3–15.4)
WBC: 6.1 10*3/uL (ref 3.4–10.8)

## 2016-12-10 LAB — LIPID PANEL WITH LDL/HDL RATIO
Cholesterol, Total: 196 mg/dL (ref 100–199)
HDL: 52 mg/dL (ref >39)
LDL Calculated: 125 mg/dL — ABNORMAL HIGH (ref 0–99)
LDl/HDL Ratio: 2.4 ratio units (ref 0.0–3.2)
Triglycerides: 97 mg/dL (ref 0–149)
VLDL Cholesterol Cal: 19 mg/dL (ref 5–40)

## 2016-12-10 LAB — TSH: TSH: 4.22 u[IU]/mL (ref 0.450–4.500)

## 2016-12-29 ENCOUNTER — Other Ambulatory Visit: Payer: Self-pay | Admitting: Family Medicine

## 2017-01-12 ENCOUNTER — Ambulatory Visit: Payer: Medicare Other | Admitting: Family Medicine

## 2017-04-06 ENCOUNTER — Other Ambulatory Visit: Payer: Self-pay | Admitting: Family Medicine

## 2017-04-06 DIAGNOSIS — Z1231 Encounter for screening mammogram for malignant neoplasm of breast: Secondary | ICD-10-CM

## 2017-05-03 ENCOUNTER — Ambulatory Visit
Admission: RE | Admit: 2017-05-03 | Discharge: 2017-05-03 | Disposition: A | Discharge status: Home / Self Care | Payer: Medicare Other | Source: Ambulatory Visit | Attending: Family Medicine | Admitting: Family Medicine

## 2017-05-03 DIAGNOSIS — Z1231 Encounter for screening mammogram for malignant neoplasm of breast: Secondary | ICD-10-CM | POA: Diagnosis not present

## 2017-05-03 HISTORY — DX: Malignant (primary) neoplasm, unspecified: C80.1

## 2017-07-29 ENCOUNTER — Ambulatory Visit (INDEPENDENT_AMBULATORY_CARE_PROVIDER_SITE_OTHER): Payer: Medicare Other | Admitting: Family Medicine

## 2017-07-29 ENCOUNTER — Encounter: Payer: Self-pay | Admitting: Family Medicine

## 2017-07-29 VITALS — BP 120/60 | HR 68 | Temp 98.0°F | Resp 16 | Wt 170.0 lb

## 2017-07-29 DIAGNOSIS — L237 Allergic contact dermatitis due to plants, except food: Secondary | ICD-10-CM | POA: Diagnosis not present

## 2017-07-29 NOTE — Patient Instructions (Signed)

## 2017-07-29 NOTE — Progress Notes (Signed)
Patient: Tara Marquez Female    DOB: [birth year]   76 y.o.   MRN: [id number] Visit Date: 07/29/2017  Today's Provider: Vernie Murders, PA   Chief Complaint  Patient presents with  . Rash   Subjective:    HPI Pt is here today for a rash on her face. She reports that it started about 3 days ago. It is located on her right side of her face around her eye, on her cheek and on her left cheek. She reports that it itches and burns but is not painful. She thought it may be poison oaks but she does not know where she would have gotten it from. No changes in her medications, detergents, make up or soaps. The rash has not effected her eye sight. She has been using Benadryl cream for the itching and has helped some.     Patient Active Problem List   Diagnosis Date Noted  . Giant cell arteritis ([place]) 08/27/2015  . A-fib ([place]) 08/27/2015  . Chest wall tenderness 08/27/2015  . Clinical depression 08/27/2015  . Blood pressure elevated 08/27/2015  . Polypharmacy 08/27/2015  . Esophagitis, reflux 08/27/2015  . Fatigue 08/27/2015  . Acid reflux 08/27/2015  . Cephalalgia 08/27/2015  . HLD (hyperlipidemia) 08/27/2015  . Adult hypothyroidism 08/27/2015  . Adaptive colitis 08/27/2015  . Malaise and fatigue 08/27/2015  . Mild major depression ([place]) 08/27/2015  . Polymyalgia rheumatica ([place]) 08/27/2015  . Cranial arteritis ([place]) 08/27/2015  . Avitaminosis D 08/27/2015   Past Surgical History:  Procedure Laterality Date  . ABDOMINAL HYSTERECTOMY  03/1971   Status Post Hysterectomy  . APPENDECTOMY  1972  . CATARACT EXTRACTION Left   . TONSILLECTOMY AND ADENOIDECTOMY  1951  . WISDOM TOOTH EXTRACTION  1970   Family History  Problem Relation Age of Onset  . Cancer Sister        Has had Breast Cancer and is in remission  . Breast cancer Sister 62  . Congenital heart disease Son    Allergies  Allergen Reactions  . Sulfa Antibiotics Hives  . Amoxicillin Rash  . Penicillins Rash    Allergies  Allergen Reactions  . Sulfa Antibiotics Hives  . Amoxicillin Rash  . Penicillins Rash     Current Outpatient Prescriptions:  .  aspirin EC 325 MG tablet, Take 1 tablet by mouth daily., Disp: , Rfl:  .  levothyroxine (SYNTHROID, LEVOTHROID) 50 MCG tablet, TAKE 1 TABLET EVERY DAY, Disp: 90 tablet, Rfl: 2 .  metoprolol tartrate (LOPRESSOR) 25 MG tablet, Take 1 tablet by mouth as needed., Disp: , Rfl:  .  oxybutynin (DITROPAN) 5 MG tablet, Take 1 tablet (5 mg total) by mouth 2 (two) times daily., Disp: 60 tablet, Rfl: 12 .  sertraline (ZOLOFT) 100 MG tablet, TAKE 1 AND 1/2 TABLETS EVERY DAY (NEED TO MAKE APPOINTMENT), Disp: 135 tablet, Rfl: 3  Review of Systems  Constitutional: Negative.   HENT: Negative.   Eyes: Negative.   Respiratory: Negative.   Cardiovascular: Negative.   Gastrointestinal: Negative.   Endocrine: Negative.   Genitourinary: Negative.   Musculoskeletal: Negative.   Skin: Positive for rash (itching).  Allergic/Immunologic: Negative.   Neurological: Negative.   Hematological: Negative.   Psychiatric/Behavioral: Negative.     Social History  Substance Use Topics  . Smoking status: Never Smoker  . Smokeless tobacco: Never Used  . Alcohol use No   Objective:   BP 120/60 (BP Location: Left Arm, Patient Position: Sitting, Cuff Size: Normal)  Pulse 68   Temp 98 F (36.7 C) (Oral)   Resp 16   Wt 170 lb (77.1 kg)   BMI 29.18 kg/m  Vitals:   07/29/17 1117  BP: 120/60  Pulse: 68  Resp: 16  Temp: 98 F (36.7 C)  TempSrc: Oral  Weight: 170 lb (77.1 kg)     Physical Exam  Constitutional: She appears well-developed and well-nourished.  HENT:  Head: Normocephalic.  Skin: Rash noted.      Assessment & Plan:     1. Allergic contact dermatitis due to plants, except food Onset of a pruritic erythematous rash on face over the past 3 days. Has been working in her yard. Suspect rhus dermatitis on the right and left cheek with few faint  lesions on the right forehead and left upper eyelid. May use OTC Hydrocortisone 1% with Benadryl Cream prn. Recheck in 5-7 days if no better.        Vernie Murders, PA  [facility]

## 2017-09-08 DIAGNOSIS — I493 Ventricular premature depolarization: Secondary | ICD-10-CM | POA: Diagnosis not present

## 2017-09-08 DIAGNOSIS — E782 Mixed hyperlipidemia: Secondary | ICD-10-CM | POA: Diagnosis not present

## 2017-09-08 DIAGNOSIS — I480 Paroxysmal atrial fibrillation: Secondary | ICD-10-CM | POA: Diagnosis not present

## 2017-09-08 DIAGNOSIS — I10 Essential (primary) hypertension: Secondary | ICD-10-CM | POA: Diagnosis not present

## 2017-10-13 ENCOUNTER — Ambulatory Visit: Payer: Medicare Other

## 2017-10-14 ENCOUNTER — Ambulatory Visit (INDEPENDENT_AMBULATORY_CARE_PROVIDER_SITE_OTHER): Payer: Medicare Other | Admitting: Family Medicine

## 2017-10-14 ENCOUNTER — Encounter: Payer: Self-pay | Admitting: Family Medicine

## 2017-10-14 VITALS — BP 132/70 | HR 70 | Temp 97.8°F | Resp 16 | Wt 172.4 lb

## 2017-10-14 DIAGNOSIS — J208 Acute bronchitis due to other specified organisms: Secondary | ICD-10-CM | POA: Diagnosis not present

## 2017-10-14 MED ORDER — AZITHROMYCIN 250 MG PO TABS: ORAL_TABLET | ORAL | 0 refills | Status: DC

## 2017-10-14 MED ORDER — HYDROCOD POLST-CPM POLST ER 10-8 MG/5ML PO SUER: ORAL | 0 refills | Status: DC

## 2017-10-14 NOTE — Progress Notes (Signed)
Subjective:     Patient ID: Tara Marquez, female   DOB: Oct 20, 1941, 76 y.o.   MRN: [id number]  HPI  Chief Complaint  Patient presents with  . Cough    Patient comes in office today with conerns of cough for one week, patient states that it is gradually getting worse and moving into her chest. Patient states that she has wheezing but is not short of breath, congested or having symptoms such as runny nose, ear pain or sinus pain/pressure.   States she developed transient sore throat but no significant sinus congestion. Reports cough productive of yellowish sputum.   Review of Systems  Constitutional: Negative for chills and fever.       Objective:   Physical Exam  Constitutional: She appears well-developed and well-nourished. No distress.  Ears: T.M's intact without inflammation Throat: tonsils absent; no erythema Neck: no cervical adenopathy Lungs: clear     Assessment:    1. Acute viral bronchitis - chlorpheniramine-HYDROcodone (TUSSIONEX PENNKINETIC ER) 10-8 MG/5ML SUER; 2.5 ml. To 5 ml. Every 12 hours as needed for cough  Dispense: 60 mL; Refill: 0 - azithromycin (ZITHROMAX Z-PAK) 250 MG tablet; 2 pills the first day then one pill daily  Dispense: 6 each; Refill: 0    Plan:    Add expectorant. Start abx if not improving over the next week.

## 2017-10-14 NOTE — Patient Instructions (Signed)
Add an expectorant like Mucinex. Try the lower dose of cough syrup that I prescribed. If sputum not improving over the next week or not feeling better, start the antibiotic.

## 2017-10-27 ENCOUNTER — Ambulatory Visit (INDEPENDENT_AMBULATORY_CARE_PROVIDER_SITE_OTHER): Payer: Medicare Other

## 2017-10-27 DIAGNOSIS — Z23 Encounter for immunization: Secondary | ICD-10-CM

## 2017-11-16 DIAGNOSIS — L821 Other seborrheic keratosis: Secondary | ICD-10-CM | POA: Diagnosis not present

## 2017-11-16 DIAGNOSIS — D1801 Hemangioma of skin and subcutaneous tissue: Secondary | ICD-10-CM | POA: Diagnosis not present

## 2017-11-16 DIAGNOSIS — Z08 Encounter for follow-up examination after completed treatment for malignant neoplasm: Secondary | ICD-10-CM | POA: Diagnosis not present

## 2017-11-16 DIAGNOSIS — D485 Neoplasm of uncertain behavior of skin: Secondary | ICD-10-CM | POA: Diagnosis not present

## 2017-11-16 DIAGNOSIS — C44619 Basal cell carcinoma of skin of left upper limb, including shoulder: Secondary | ICD-10-CM | POA: Diagnosis not present

## 2017-11-16 DIAGNOSIS — Z85828 Personal history of other malignant neoplasm of skin: Secondary | ICD-10-CM | POA: Diagnosis not present

## 2017-11-25 ENCOUNTER — Telehealth: Payer: Self-pay | Admitting: Family Medicine

## 2017-12-06 ENCOUNTER — Other Ambulatory Visit: Payer: Self-pay | Admitting: Family Medicine

## 2017-12-16 DIAGNOSIS — L905 Scar conditions and fibrosis of skin: Secondary | ICD-10-CM | POA: Diagnosis not present

## 2017-12-16 DIAGNOSIS — C44619 Basal cell carcinoma of skin of left upper limb, including shoulder: Secondary | ICD-10-CM | POA: Diagnosis not present

## 2017-12-28 ENCOUNTER — Telehealth: Payer: Self-pay | Admitting: Family Medicine

## 2018-02-07 ENCOUNTER — Other Ambulatory Visit: Payer: Self-pay | Admitting: Family Medicine

## 2018-02-21 ENCOUNTER — Ambulatory Visit (INDEPENDENT_AMBULATORY_CARE_PROVIDER_SITE_OTHER): Payer: Medicare Other

## 2018-02-21 ENCOUNTER — Ambulatory Visit (INDEPENDENT_AMBULATORY_CARE_PROVIDER_SITE_OTHER): Payer: Medicare Other | Admitting: Family Medicine

## 2018-02-21 VITALS — BP 120/62 | HR 60 | Temp 97.5°F | Ht 64.0 in | Wt 172.8 lb

## 2018-02-21 DIAGNOSIS — I10 Essential (primary) hypertension: Secondary | ICD-10-CM | POA: Diagnosis not present

## 2018-02-21 DIAGNOSIS — Z0000 Encounter for general adult medical examination without abnormal findings: Secondary | ICD-10-CM | POA: Diagnosis not present

## 2018-02-21 DIAGNOSIS — E785 Hyperlipidemia, unspecified: Secondary | ICD-10-CM | POA: Diagnosis not present

## 2018-02-21 DIAGNOSIS — Z1211 Encounter for screening for malignant neoplasm of colon: Secondary | ICD-10-CM | POA: Diagnosis not present

## 2018-02-21 DIAGNOSIS — Z6829 Body mass index (BMI) 29.0-29.9, adult: Secondary | ICD-10-CM | POA: Diagnosis not present

## 2018-02-21 DIAGNOSIS — E039 Hypothyroidism, unspecified: Secondary | ICD-10-CM | POA: Diagnosis not present

## 2018-02-21 NOTE — Progress Notes (Signed)
Subjective:   Tara Marquez is a 77 y.o. female who presents for Medicare Annual (Subsequent) preventive examination.  Review of Systems:  N/A  Cardiac Risk Factors include: advanced age (>38men, >59 women);dyslipidemia     Objective:     Vitals: BP 120/62 (BP Location: Left Arm)   Pulse 60   Temp (!) 97.5 F (36.4 C) (Oral)   Ht 5\' 4"  (1.626 m)   Wt 172 lb 12.8 oz (78.4 kg)   BMI 29.66 kg/m   Body mass index is 29.66 kg/m.  Advanced Directives 02/21/2018 12/09/2016  Does Patient Have a Medical Advance Directive? Yes No  Type of Paramedic of [place];Living will -  Copy of [place] in Chart? No - copy requested -    Tobacco Social History   Tobacco Use  Smoking Status Never Smoker  Smokeless Tobacco Never Used     Counseling given: Not Answered   Clinical Intake:  Pre-visit preparation completed: Yes  Pain : No/denies pain Pain Score: 0-No pain     Nutritional Status: BMI 25 -29 Overweight Nutritional Risks: None Diabetes: No  How often do you need to have someone help you when you read instructions, pamphlets, or other written materials from your doctor or pharmacy?: 1 - Never  Interpreter Needed?: No  Information entered by :: [facility], LPN  Past Medical History:  Diagnosis Date  . Cancer ([place])    skin ca  . Depression    mild major depression  . GERD (gastroesophageal reflux disease)   . Hyperlipidemia    Past Surgical History:  Procedure Laterality Date  . ABDOMINAL HYSTERECTOMY  03/1971   Status Post Hysterectomy  . APPENDECTOMY  1972  . CATARACT EXTRACTION Left   . TONSILLECTOMY AND ADENOIDECTOMY  1951  . WISDOM TOOTH EXTRACTION  1970   Family History  Problem Relation Age of Onset  . Cancer Sister        Has had Breast Cancer and is in remission  . Breast cancer Sister 55  . Congenital heart disease Son    Social History   Socioeconomic History  . Marital status: Married   Spouse name: None  . Number of children: 2  . Years of education: None  . Highest education level: Some college, no degree  Social Needs  . Financial resource strain: Not hard at all  . Food insecurity - worry: Never true  . Food insecurity - inability: Never true  . Transportation needs - medical: No  . Transportation needs - non-medical: No  Occupational History  . None  Tobacco Use  . Smoking status: Never Smoker  . Smokeless tobacco: Never Used  Substance and Sexual Activity  . Alcohol use: No  . Drug use: No  . Sexual activity: None  Other Topics Concern  . None  Social History Narrative  . None    Outpatient Encounter Medications as of 02/21/2018  Medication Sig  . aspirin EC 325 MG tablet Take 1 tablet by mouth daily.  Marland Kitchen levothyroxine (SYNTHROID, LEVOTHROID) 50 MCG tablet TAKE 1 TABLET EVERY DAY  . metoprolol tartrate (LOPRESSOR) 25 MG tablet Take 1 tablet by mouth as needed.  . sertraline (ZOLOFT) 100 MG tablet TAKE 1 AND 1/2 TABLETS EVERY DAY (NEED TO MAKE APPOINTMENT)  . oxybutynin (DITROPAN) 5 MG tablet Take 1 tablet (5 mg total) by mouth 2 (two) times daily. (Patient not taking: Reported on 02/21/2018)  . [DISCONTINUED] azithromycin (ZITHROMAX Z-PAK) 250 MG tablet 2 pills  the first day then one pill daily  . [DISCONTINUED] chlorpheniramine-HYDROcodone (TUSSIONEX PENNKINETIC ER) 10-8 MG/5ML SUER 2.5 ml. To 5 ml. Every 12 hours as needed for cough   No facility-administered encounter medications on file as of 02/21/2018.     Activities of Daily Living In your present state of health, do you have any difficulty performing the following activities: 02/21/2018  Hearing? N  Vision? N  Difficulty concentrating or making decisions? N  Walking or climbing stairs? N  Dressing or bathing? N  Doing errands, shopping? N  Preparing Food and eating ? N  Using the Toilet? N  In the past six months, have you accidently leaked urine? Y  Comment Occasionally wears protection.    Do you have problems with loss of bowel control? Y  Comment Occasionally wears protection.  Managing your Medications? N  Managing your Finances? N  Housekeeping or managing your Housekeeping? N  Some recent data might be hidden    Patient Care Team: Jerrol Banana., MD as PCP - General (Family Medicine) Birder Robson, MD as Referring Physician (Ophthalmology) Corey Skains, MD as Consulting Physician (Cardiology) Oneta Rack, MD as Consulting Physician (Dermatology)    Assessment:   This is a routine wellness examination for Tara Marquez.  Exercise Activities and Dietary recommendations Current Exercise Habits: The patient does not participate in regular exercise at present, Exercise limited by: None identified  Goals    . DIET - INCREASE WATER INTAKE     Recommend increasing water intake to 4-6 glasses a day.        Fall Risk Fall Risk  02/21/2018 12/09/2016 12/19/2015  Falls in the past year? Yes Yes Yes  Number falls in past yr: 1 1 2  or more  Comment or 2 - -  Injury with Fall? No No No  Follow up Falls prevention discussed - -   Is the patient's home free of loose throw rugs in walkways, pet beds, electrical cords, etc?   yes      Grab bars in the bathroom? yes      Handrails on the stairs?   yes      Adequate lighting?   yes  Timed Get Up and Go performed: N/A  Depression Screen PHQ 2/9 Scores 02/21/2018 12/09/2016  PHQ - 2 Score 0 1     Cognitive Function: Pt declined screening today.     6CIT Screen 12/09/2016  What Year? 0 points  What month? 0 points  What time? 0 points  Count back from 20 0 points  Months in reverse 0 points  Repeat phrase 0 points  Total Score 0    Immunization History  Administered Date(s) Administered  . Influenza Split 11/16/2012  . Influenza, High Dose Seasonal PF 10/01/2014, 10/04/2015, 10/14/2016, 10/27/2017  . Influenza-Unspecified 09/26/2014  . Pneumococcal Conjugate-13 10/14/2016  . Pneumococcal  Polysaccharide-23 10/12/2008    Qualifies for Shingles Vaccine? Due for Shingles vaccine. Declined my offer to administer today. Education has been provided regarding the importance of this vaccine. Pt has been advised to call her insurance company to determine her out of pocket expense. Advised she may also receive this vaccine at her local pharmacy or Health Dept. Verbalized acceptance and understanding.  Screening Tests Health Maintenance  Topic Date Due  . TETANUS/TDAP  03/03/1961  . COLONOSCOPY  02/04/2019  . INFLUENZA VACCINE  Completed  . DEXA SCAN  Completed  . PNA vac Low Risk Adult  Completed    Cancer Screenings: Lung:  Low Dose CT Chest recommended if Age 68-80 years, 30 pack-year currently smoking OR have quit w/in 15years. Patient does not qualify. Breast:  Up to date on Mammogram? Yes   Up to date of Bone Density/Dexa? Yes Colorectal: Up to date  Additional Screenings:  Hepatitis C Screening: N/A     Plan:  I have personally reviewed and addressed the Medicare Annual Wellness questionnaire and have noted the following in the patient's chart:  A. Medical and social history B. Use of alcohol, tobacco or illicit drugs  C. Current medications and supplements D. Functional ability and status E.  Nutritional status F.  Physical activity G. Advance directives H. List of other physicians I.  Hospitalizations, surgeries, and ER visits in previous 12 months J.  [place] such as hearing and vision if needed, cognitive and depression L. Referrals and appointments - none  In addition, I have reviewed and discussed with patient certain preventive protocols, quality metrics, and best practice recommendations. A written personalized care plan for preventive services as well as general preventive health recommendations were provided to patient.  See attached scanned questionnaire for additional information.   Signed,  Fabio Neighbors, LPN Nurse Health  Advisor   Nurse Recommendations: None.

## 2018-02-21 NOTE — Patient Instructions (Addendum)
Hyperlipidemia AFIB Adult Hypothyroidism OAB   Labs ordered Lipid panel  CBC CMET TSH Cologuard

## 2018-02-21 NOTE — Patient Instructions (Signed)
Tara Marquez , Thank you for taking time to come for your Medicare Wellness Visit. I appreciate your ongoing commitment to your health goals. Please review the following plan we discussed and let me know if I can assist you in the future.   Screening recommendations/referrals: Colonoscopy: Up to date Mammogram: Up to date Bone Density: Up to date Recommended yearly ophthalmology/optometry visit for glaucoma screening and checkup Recommended yearly dental visit for hygiene and checkup  Vaccinations: Influenza vaccine: Up to date Pneumococcal vaccine: Up to date Tdap vaccine: Pt declines today.  Shingles vaccine: Pt declines today.     Advanced directives: Please bring a copy of your POA (Power of Attorney) and/or Living Will to your next appointment.   Conditions/risks identified: Fall risk prevention; Recommend increasing water intake to 4-6 glasses a day.   Next appointment: 10:30 AM today with Dr Rosanna Randy.    Preventive Care 22 Years and Older, Female Preventive care refers to lifestyle choices and visits with your health care provider that can promote health and wellness. What does preventive care include?  A yearly physical exam. This is also called an annual well check.  Dental exams once or twice a year.  Routine eye exams. Ask your health care provider how often you should have your eyes checked.  Personal lifestyle choices, including:  Daily care of your teeth and gums.  Regular physical activity.  Eating a healthy diet.  Avoiding tobacco and drug use.  Limiting alcohol use.  Practicing safe sex.  Taking low-dose aspirin every day.  Taking vitamin and mineral supplements as recommended by your health care provider. What happens during an annual well check? The services and screenings done by your health care provider during your annual well check will depend on your age, overall health, lifestyle risk factors, and family history of disease. Counseling  Your  health care provider may ask you questions about your:  Alcohol use.  Tobacco use.  Drug use.  Emotional well-being.  Home and relationship well-being.  Sexual activity.  Eating habits.  History of falls.  Memory and ability to understand (cognition).  Work and work Statistician.  Reproductive health. Screening  You may have the following tests or measurements:  Height, weight, and BMI.  Blood pressure.  Lipid and cholesterol levels. These may be checked every 5 years, or more frequently if you are over 97 years old.  Skin check.  Lung cancer screening. You may have this screening every year starting at age 80 if you have a 30-pack-year history of smoking and currently smoke or have quit within the past 15 years.  Fecal occult blood test (FOBT) of the stool. You may have this test every year starting at age 1.  Flexible sigmoidoscopy or colonoscopy. You may have a sigmoidoscopy every 5 years or a colonoscopy every 10 years starting at age 23.  Hepatitis C blood test.  Hepatitis B blood test.  Sexually transmitted disease (STD) testing.  Diabetes screening. This is done by checking your blood sugar (glucose) after you have not eaten for a while (fasting). You may have this done every 1-3 years.  Bone density scan. This is done to screen for osteoporosis. You may have this done starting at age 76.  Mammogram. This may be done every 1-2 years. Talk to your health care provider about how often you should have regular mammograms. Talk with your health care provider about your test results, treatment options, and if necessary, the need for more tests. Vaccines  Your health  care provider may recommend certain vaccines, such as:  Influenza vaccine. This is recommended every year.  Tetanus, diphtheria, and acellular pertussis (Tdap, Td) vaccine. You may need a Td booster every 10 years.  Zoster vaccine. You may need this after age 23.  Pneumococcal 13-valent  conjugate (PCV13) vaccine. One dose is recommended after age 79.  Pneumococcal polysaccharide (PPSV23) vaccine. One dose is recommended after age 70. Talk to your health care provider about which screenings and vaccines you need and how often you need them. This information is not intended to replace advice given to you by your health care provider. Make sure you discuss any questions you have with your health care provider. Document Released: 01/09/2016 Document Revised: 09/01/2016 Document Reviewed: 10/14/2015 Elsevier Interactive Patient Education  2017 [place] Prevention in the Home Falls can cause injuries. They can happen to people of all ages. There are many things you can do to make your home safe and to help prevent falls. What can I do on the outside of my home?  Regularly fix the edges of walkways and driveways and fix any cracks.  Remove anything that might make you trip as you walk through a door, such as a raised step or threshold.  Trim any bushes or trees on the path to your home.  Use bright outdoor lighting.  Clear any walking paths of anything that might make someone trip, such as rocks or tools.  Regularly check to see if handrails are loose or broken. Make sure that both sides of any steps have handrails.  Any raised decks and porches should have guardrails on the edges.  Have any leaves, snow, or ice cleared regularly.  Use sand or salt on walking paths during winter.  Clean up any spills in your garage right away. This includes oil or grease spills. What can I do in the bathroom?  Use night lights.  Install grab bars by the toilet and in the tub and shower. Do not use towel bars as grab bars.  Use non-skid mats or decals in the tub or shower.  If you need to sit down in the shower, use a plastic, non-slip stool.  Keep the floor dry. Clean up any water that spills on the floor as soon as it happens.  Remove soap buildup in the tub or  shower regularly.  Attach bath mats securely with double-sided non-slip rug tape.  Do not have throw rugs and other things on the floor that can make you trip. What can I do in the bedroom?  Use night lights.  Make sure that you have a light by your bed that is easy to reach.  Do not use any sheets or blankets that are too big for your bed. They should not hang down onto the floor.  Have a firm chair that has side arms. You can use this for support while you get dressed.  Do not have throw rugs and other things on the floor that can make you trip. What can I do in the kitchen?  Clean up any spills right away.  Avoid walking on wet floors.  Keep items that you use a lot in easy-to-reach places.  If you need to reach something above you, use a strong step stool that has a grab bar.  Keep electrical cords out of the way.  Do not use floor polish or wax that makes floors slippery. If you must use wax, use non-skid floor wax.  Do not have  throw rugs and other things on the floor that can make you trip. What can I do with my stairs?  Do not leave any items on the stairs.  Make sure that there are handrails on both sides of the stairs and use them. Fix handrails that are broken or loose. Make sure that handrails are as long as the stairways.  Check any carpeting to make sure that it is firmly attached to the stairs. Fix any carpet that is loose or worn.  Avoid having throw rugs at the top or bottom of the stairs. If you do have throw rugs, attach them to the floor with carpet tape.  Make sure that you have a light switch at the top of the stairs and the bottom of the stairs. If you do not have them, ask someone to add them for you. What else can I do to help prevent falls?  Wear shoes that:  Do not have high heels.  Have rubber bottoms.  Are comfortable and fit you well.  Are closed at the toe. Do not wear sandals.  If you use a stepladder:  Make sure that it is fully  opened. Do not climb a closed stepladder.  Make sure that both sides of the stepladder are locked into place.  Ask someone to hold it for you, if possible.  Clearly mark and make sure that you can see:  Any grab bars or handrails.  First and last steps.  Where the edge of each step is.  Use tools that help you move around (mobility aids) if they are needed. These include:  Canes.  Walkers.  Scooters.  Crutches.  Turn on the lights when you go into a dark area. Replace any light bulbs as soon as they burn out.  Set up your furniture so you have a clear path. Avoid moving your furniture around.  If any of your floors are uneven, fix them.  If there are any pets around you, be aware of where they are.  Review your medicines with your doctor. Some medicines can make you feel dizzy. This can increase your chance of falling. Ask your doctor what other things that you can do to help prevent falls. This information is not intended to replace advice given to you by your health care provider. Make sure you discuss any questions you have with your health care provider. Document Released: 10/09/2009 Document Revised: 05/20/2016 Document Reviewed: 01/17/2015 Elsevier Interactive Patient Education  2017 [employer].

## 2018-02-21 NOTE — Progress Notes (Signed)
Patient: Tara Marquez Female    DOB: April 24, 1941   77 y.o.   MRN: [id number] Visit Date: 02/21/2018  Today's Provider: [name], MD   No chief complaint on file.  Subjective:   Patient saw McKenzie today at 10:00 am for AWV.  HPI No complaints today.  Lipid/Cholesterol, Follow-up:   Last seen for this 12/09/2016.  Management since that visit includes; labs checked, no changes.  Last Lipid Panel:    Component Value Date/Time   CHOL 196 12/09/2016 1131   TRIG 97 12/09/2016 1131   HDL 52 12/09/2016 1131   LDLCALC 125 (H) 12/09/2016 1131    She reports good compliance with treatment. She is not having side effects. none  Wt Readings from Last 3 Encounters:  02/21/18 172 lb 12.8 oz (78.4 kg)  10/14/17 172 lb 6.4 oz (78.2 kg)  07/29/17 170 lb (77.1 kg)    ----------------------------------------------------------------  Atrial fibrillation, unspecified type ([place]) From 12/09/2016-labs ordered, no changes.   Adult hypothyroidism From 12/09/2016-labs ordered, no changes.   Overactive bladder/OAB From 12/09/2016-given oxybutynin (DITROPAN) 5 MG tablet.    Allergies  Allergen Reactions  . Sulfa Antibiotics Hives  . Amoxicillin Rash  . Penicillins Rash     Current Outpatient Medications:  .  aspirin EC 325 MG tablet, Take 1 tablet by mouth daily., Disp: , Rfl:  .  levothyroxine (SYNTHROID, LEVOTHROID) 50 MCG tablet, TAKE 1 TABLET EVERY DAY, Disp: 90 tablet, Rfl: 0 .  metoprolol tartrate (LOPRESSOR) 25 MG tablet, Take 1 tablet by mouth as needed., Disp: , Rfl:  .  oxybutynin (DITROPAN) 5 MG tablet, Take 1 tablet (5 mg total) by mouth 2 (two) times daily. (Patient not taking: Reported on 02/21/2018), Disp: 60 tablet, Rfl: 12 .  sertraline (ZOLOFT) 100 MG tablet, TAKE 1 AND 1/2 TABLETS EVERY DAY (NEED TO MAKE APPOINTMENT), Disp: 135 tablet, Rfl: 3  Review of Systems  Constitutional: Positive for fatigue.  HENT: Negative.   Eyes: Negative.     Respiratory: Negative.   Cardiovascular: Positive for palpitations.  Gastrointestinal: Negative.   Endocrine: Negative.   Genitourinary: Positive for frequency and genital sores.  Musculoskeletal: Negative.   Skin: Negative.   Allergic/Immunologic: Negative.   Neurological: Negative.   Hematological: Negative.   Psychiatric/Behavioral: Negative.     Social History   Tobacco Use  . Smoking status: Never Smoker  . Smokeless tobacco: Never Used  Substance Use Topics  . Alcohol use: No   Objective:   BP  120/62 (BP Location: Left Arm)     Pulse  60     Temp  97.5 F (36.4 C) Abnormal   (Oral)     Ht  5\' 4"  (1.626 m)     Wt  172 lb 12.8 oz (78.4 kg)      BMI  29.66 kg/m        Physical Exam  Constitutional: She is oriented to person, place, and time. She appears well-developed and well-nourished.  HENT:  Head: Normocephalic and atraumatic.  Right Ear: External ear normal.  Left Ear: External ear normal.  Nose: Nose normal.  Mouth/Throat: Oropharynx is clear and moist.  Eyes: Conjunctivae are normal.  Neck: No thyromegaly present.  Cardiovascular: Normal rate, regular rhythm, normal heart sounds and intact distal pulses.  Pulmonary/Chest: Effort normal and breath sounds normal.  Abdominal: Soft.  Musculoskeletal: She exhibits no edema.  Neurological: She is oriented to person, place, and time.  Skin: Skin is warm and  dry.  Fair complexion.  Psychiatric: She has a normal mood and affect. Her behavior is normal. Thought content normal.        Assessment & Plan:     1. BMI 29.0-29.9,adult   2. Hyperlipidemia, unspecified hyperlipidemia type  - Lipid panel - CBC w/Diff/Platelet - Comprehensive metabolic panel - TSH  3. Adult hypothyroidism  - Lipid panel - CBC w/Diff/Platelet - Comprehensive metabolic panel - TSH  4. Hypertension, unspecified type  - Lipid panel - CBC w/Diff/Platelet - Comprehensive metabolic panel - TSH  5.  Colon cancer screening  - Cologuard  I have done the exam and reviewed the chart and it is accurate to the best of my knowledge. Development worker, community has been used and  any errors in dictation or transcription are unintentional. Miguel Aschoff M.D. [place], MD  [facility]

## 2018-02-22 ENCOUNTER — Telehealth: Payer: Self-pay | Admitting: Emergency Medicine

## 2018-02-22 LAB — CBC WITH DIFFERENTIAL/PLATELET
Basophils Absolute: 0.1 10*3/uL (ref 0.0–0.2)
Basos: 1 %
EOS (ABSOLUTE): 0.1 10*3/uL (ref 0.0–0.4)
Eos: 2 %
Hematocrit: 43.1 % (ref 34.0–46.6)
Hemoglobin: 14.3 g/dL (ref 11.1–15.9)
Immature Grans (Abs): 0 10*3/uL (ref 0.0–0.1)
Immature Granulocytes: 0 %
Lymphocytes Absolute: 2 10*3/uL (ref 0.7–3.1)
Lymphs: 27 %
MCH: 28.3 pg (ref 26.6–33.0)
MCHC: 33.2 g/dL (ref 31.5–35.7)
MCV: 85 fL (ref 79–97)
Monocytes Absolute: 0.5 10*3/uL (ref 0.1–0.9)
Monocytes: 7 %
Neutrophils Absolute: 4.7 10*3/uL (ref 1.4–7.0)
Neutrophils: 63 %
Platelets: 243 10*3/uL (ref 150–379)
RBC: 5.05 x10E6/uL (ref 3.77–5.28)
RDW: 14.1 % (ref 12.3–15.4)
WBC: 7.5 10*3/uL (ref 3.4–10.8)

## 2018-02-22 LAB — COMPREHENSIVE METABOLIC PANEL
ALT: 18 IU/L (ref 0–32)
AST: 24 IU/L (ref 0–40)
Albumin/Globulin Ratio: 1.7 (ref 1.2–2.2)
Albumin: 4.3 g/dL (ref 3.5–4.8)
Alkaline Phosphatase: 99 IU/L (ref 39–117)
BUN/Creatinine Ratio: 15 (ref 12–28)
BUN: 17 mg/dL (ref 8–27)
Bilirubin Total: 0.5 mg/dL (ref 0.0–1.2)
CO2: 22 mmol/L (ref 20–29)
Calcium: 9.4 mg/dL (ref 8.7–10.3)
Chloride: 103 mmol/L (ref 96–106)
Creatinine, Ser: 1.11 mg/dL — ABNORMAL HIGH (ref 0.57–1.00)
GFR calc Af Amer: 56 mL/min/{1.73_m2} — ABNORMAL LOW (ref >59)
GFR calc non Af Amer: 49 mL/min/{1.73_m2} — ABNORMAL LOW (ref >59)
Globulin, Total: 2.6 g/dL (ref 1.5–4.5)
Glucose: 105 mg/dL — ABNORMAL HIGH (ref 65–99)
Potassium: 4.8 mmol/L (ref 3.5–5.2)
Sodium: 142 mmol/L (ref 134–144)
Total Protein: 6.9 g/dL (ref 6.0–8.5)

## 2018-02-22 LAB — TSH: TSH: 4.61 u[IU]/mL — ABNORMAL HIGH (ref 0.450–4.500)

## 2018-02-22 LAB — LIPID PANEL
Chol/HDL Ratio: 3.8 ratio (ref 0.0–4.4)
Cholesterol, Total: 208 mg/dL — ABNORMAL HIGH (ref 100–199)
HDL: 55 mg/dL (ref >39)
LDL Calculated: 129 mg/dL — ABNORMAL HIGH (ref 0–99)
Triglycerides: 122 mg/dL (ref 0–149)
VLDL Cholesterol Cal: 24 mg/dL (ref 5–40)

## 2018-02-22 NOTE — Telephone Encounter (Signed)
LMTCB

## 2018-02-22 NOTE — Telephone Encounter (Signed)
-----   Message from Jerrol Banana., MD sent at 02/22/2018  9:35 AM EST ----- Some decrease in kidney function ,push fluids a little--no NSAIDs,

## 2018-02-23 NOTE — Telephone Encounter (Signed)
Pt advised.

## 2018-02-24 ENCOUNTER — Telehealth: Payer: Self-pay | Admitting: Family Medicine

## 2018-02-24 NOTE — Telephone Encounter (Signed)
Order for cologuard faxed to [facility] °

## 2018-03-06 DIAGNOSIS — Z1211 Encounter for screening for malignant neoplasm of colon: Secondary | ICD-10-CM | POA: Diagnosis not present

## 2018-03-06 NOTE — Telephone Encounter (Signed)
Visit complete.

## 2018-03-16 DIAGNOSIS — H3589 Other specified retinal disorders: Secondary | ICD-10-CM | POA: Diagnosis not present

## 2018-03-16 LAB — COLOGUARD: Cologuard: NEGATIVE

## 2018-07-28 ENCOUNTER — Encounter: Payer: Self-pay | Admitting: Physician Assistant

## 2018-07-28 ENCOUNTER — Ambulatory Visit (INDEPENDENT_AMBULATORY_CARE_PROVIDER_SITE_OTHER): Payer: Medicare Other | Admitting: Physician Assistant

## 2018-07-28 VITALS — BP 122/64 | HR 78 | Temp 97.5°F | Wt 163.0 lb

## 2018-07-28 DIAGNOSIS — N3090 Cystitis, unspecified without hematuria: Secondary | ICD-10-CM

## 2018-07-28 LAB — POCT URINALYSIS DIPSTICK
Bilirubin, UA: NEGATIVE
Glucose, UA: NEGATIVE
Ketones, UA: NEGATIVE
Nitrite, UA: NEGATIVE
Protein, UA: POSITIVE — AB
Spec Grav, UA: 1.015 (ref 1.010–1.025)
Urobilinogen, UA: 0.2 E.U./dL
pH, UA: 6 (ref 5.0–8.0)

## 2018-07-28 MED ORDER — NITROFURANTOIN MONOHYD MACRO 100 MG PO CAPS: 100.0000 mg | ORAL_CAPSULE | Freq: Two times a day (BID) | ORAL | 0 refills | Status: AC

## 2018-07-28 NOTE — Progress Notes (Signed)
[facility]  Chief Complaint  Patient presents with  . Urinary Tract Infection    Subjective:    Patient ID: Tara Marquez, female    DOB: [birth year], 77 y.o.   MRN: [id number]   Urinary Tract Infection: Patient complains of diarrhea, frequency and vomiting She has had symptoms for several days. Patient also complains of back pain, fever and stomach ache. Patient denies vaginal discharge. Patient does have a history of recurrent UTI.  Patient does not have a history of pyelonephritis or other renal issues. Patient denies vaginal discharge and denies new sexual partners. The patient denies recent travel outside of the [place].  Review of Systems  Constitutional: Positive for chills, fever and malaise/fatigue. Negative for diaphoresis and weight loss.  Gastrointestinal: Positive for abdominal pain, diarrhea, nausea and vomiting. Negative for blood in stool, constipation, heartburn and melena.  Genitourinary: Positive for flank pain and frequency. Negative for dysuria, hematuria and urgency.  Musculoskeletal: Positive for back pain.       Objective:   BP 122/64 (BP Location: Right Arm, Patient Position: Sitting, Cuff Size: Normal)   Pulse 78   Temp (!) 97.5 F (36.4 C) (Oral)   Wt 163 lb (73.9 kg)   SpO2 97%   BMI 27.98 kg/m   Patient Active Problem List   Diagnosis Date Noted  . Giant cell arteritis ([place]) 08/27/2015  . A-fib ([place]) 08/27/2015  . Chest wall tenderness 08/27/2015  . Clinical depression 08/27/2015  . Blood pressure elevated 08/27/2015  . Polypharmacy 08/27/2015  . Esophagitis, reflux 08/27/2015  . Fatigue 08/27/2015  . Acid reflux 08/27/2015  . Cephalalgia 08/27/2015  . HLD (hyperlipidemia) 08/27/2015  . Adult hypothyroidism 08/27/2015  . Adaptive colitis 08/27/2015  . Malaise and fatigue 08/27/2015  . Mild major depression ([place]) 08/27/2015  . Polymyalgia rheumatica ([place]) 08/27/2015  . Cranial arteritis ([place])  08/27/2015  . Avitaminosis D 08/27/2015    Outpatient Encounter Medications as of 07/28/2018  Medication Sig Note  . aspirin EC 325 MG tablet Take 1 tablet by mouth daily. 08/27/2015: Per Dr. Nehemiah Massed Received from: [place]: Take by mouth.  . metoprolol tartrate (LOPRESSOR) 25 MG tablet Take 1 tablet by mouth as needed. 08/27/2015: Medication taken as needed.  Received from: [place]: Take by mouth.  . sertraline (ZOLOFT) 100 MG tablet TAKE 1 AND 1/2 TABLETS EVERY DAY (NEED TO MAKE APPOINTMENT)   . [DISCONTINUED] levothyroxine (SYNTHROID, LEVOTHROID) 50 MCG tablet TAKE 1 TABLET EVERY DAY   . [EXPIRED] nitrofurantoin, macrocrystal-monohydrate, (MACROBID) 100 MG capsule Take 1 capsule (100 mg total) by mouth 2 (two) times daily for 5 days.   . [DISCONTINUED] oxybutynin (DITROPAN) 5 MG tablet Take 1 tablet (5 mg total) by mouth 2 (two) times daily. (Patient not taking: Reported on 02/21/2018)    No facility-administered encounter medications on file as of 07/28/2018.     Allergies  Allergen Reactions  . Sulfa Antibiotics Hives  . Amoxicillin Rash  . Penicillins Rash       Physical Exam  Constitutional: She is oriented to person, place, and time. She appears well-developed and well-nourished. No distress.  Cardiovascular: Normal rate and regular rhythm.  Pulmonary/Chest: Effort normal and breath sounds normal.  Abdominal: Soft. Bowel sounds are normal. She exhibits no distension. There is tenderness in the suprapubic area. There is no rebound, no guarding and no CVA tenderness.  Neurological: She is alert and oriented to person, place, and time.  Skin: Skin is warm and dry. She is not diaphoretic.  Psychiatric: She has a normal mood and affect. Her behavior is normal.       Assessment & Plan:   1. Cystitis  Has multiple allergies to abx including sulfa and PCN. Has tolerated macrobid in the past per 2015 encounter in  health data archiver. Her CrCl > 50, will give as below. Worried about risk of tendinopathy with cipro.   - POCT urinalysis dipstick - CULTURE, URINE COMPREHENSIVE - nitrofurantoin, macrocrystal-monohydrate, (MACROBID) 100 MG capsule; Take 1 capsule (100 mg total) by mouth 2 (two) times daily for 5 days.  Dispense: 10 capsule; Refill: 0  Return if symptoms worsen or fail to improve.  The entirety of the information documented in the History of Present Illness, Review of Systems and Physical Exam were personally obtained by me. Portions of this information were initially documented by Ashley Royalty, CMA and reviewed by me for thoroughness and accuracy.     Patient Instructions  Urinary Tract Infection, Adult A urinary tract infection (UTI) is an infection of any part of the urinary tract, which includes the kidneys, ureters, bladder, and urethra. These organs make, store, and get rid of urine in the body. UTI can be a bladder infection (cystitis) or kidney infection (pyelonephritis). What are the causes? This infection may be caused by fungi, viruses, or bacteria. Bacteria are the most common cause of UTIs. This condition can also be caused by repeated incomplete emptying of the bladder during urination. What increases the risk? This condition is more likely to develop if:  You ignore your need to urinate or hold urine for long periods of time.  You do not empty your bladder completely during urination.  You wipe back to front after urinating or having a bowel movement, if you are female.  You are uncircumcised, if you are female.  You are constipated.  You have a urinary catheter that stays in place (indwelling).  You have a weak defense (immune) system.  You have a medical condition that affects your bowels, kidneys, or bladder.  You have diabetes.  You take antibiotic medicines frequently or for long periods of time, and the antibiotics no longer work well against certain types of  infections (antibiotic resistance).  You take medicines that irritate your urinary tract.  You are exposed to chemicals that irritate your urinary tract.  You are female.  What are the signs or symptoms? Symptoms of this condition include:  Fever.  Frequent urination or passing small amounts of urine frequently.  Needing to urinate urgently.  Pain or burning with urination.  Urine that smells bad or unusual.  Cloudy urine.  Pain in the lower abdomen or back.  Trouble urinating.  Blood in the urine.  Vomiting or being less hungry than normal.  Diarrhea or abdominal pain.  Vaginal discharge, if you are female.  How is this diagnosed? This condition is diagnosed with a medical history and physical exam. You will also need to provide a urine sample to test your urine. Other tests may be done, including:  Blood tests.  Sexually transmitted disease (STD) testing.  If you have had more than one UTI, a cystoscopy or imaging studies may be done to determine the cause of the infections. How is this treated? Treatment for this condition often includes a combination of two or more of the following:  Antibiotic medicine.  Other medicines to treat less common causes of UTI.  Over-the-counter medicines to treat pain.  Drinking enough water to stay hydrated.  Follow these instructions at home:  Take over-the-counter and prescription medicines only as told by your health care provider.  If you were prescribed an antibiotic, take it as told by your health care provider. Do not stop taking the antibiotic even if you start to feel better.  Avoid alcohol, caffeine, tea, and carbonated beverages. They can irritate your bladder.  Drink enough fluid to keep your urine clear or pale yellow.  Keep all follow-up visits as told by your health care provider. This is important.  Make sure to: ? Empty your bladder often and completely. Do not hold urine for long periods of  time. ? Empty your bladder before and after sex. ? Wipe from front to back after a bowel movement if you are female. Use each tissue one time when you wipe. Contact a health care provider if:  You have back pain.  You have a fever.  You feel nauseous or vomit.  Your symptoms do not get better after 3 days.  Your symptoms go away and then return. Get help right away if:  You have severe back pain or lower abdominal pain.  You are vomiting and cannot keep down any medicines or water. This information is not intended to replace advice given to you by your health care provider. Make sure you discuss any questions you have with your health care provider. Document Released: 09/22/2005 Document Revised: 05/26/2016 Document Reviewed: 11/03/2015 Elsevier Interactive Patient Education  [employer].

## 2018-07-28 NOTE — Patient Instructions (Signed)

## 2018-07-30 LAB — CULTURE, URINE COMPREHENSIVE

## 2018-07-31 ENCOUNTER — Telehealth: Payer: Self-pay

## 2018-07-31 NOTE — Telephone Encounter (Signed)
-----   Message from Trinna Post, [place] sent at 07/31/2018  3:36 PM EDT ----- UTi showed klebsiella that was sensitive to Macrobid. How is she feeling?

## 2018-07-31 NOTE — Telephone Encounter (Signed)
Advised patient of results.  

## 2018-07-31 NOTE — Telephone Encounter (Signed)
LMTCB 07/31/2018  Thanks,   -Mickel Baas

## 2018-08-21 ENCOUNTER — Ambulatory Visit (INDEPENDENT_AMBULATORY_CARE_PROVIDER_SITE_OTHER): Payer: Medicare Other | Admitting: Family Medicine

## 2018-08-21 VITALS — BP 134/81 | HR 80 | Temp 97.9°F | Resp 16 | Wt 162.0 lb

## 2018-08-21 DIAGNOSIS — E039 Hypothyroidism, unspecified: Secondary | ICD-10-CM | POA: Diagnosis not present

## 2018-08-21 DIAGNOSIS — R399 Unspecified symptoms and signs involving the genitourinary system: Secondary | ICD-10-CM

## 2018-08-21 DIAGNOSIS — N189 Chronic kidney disease, unspecified: Secondary | ICD-10-CM

## 2018-08-21 NOTE — Progress Notes (Signed)
Tara Marquez  MRN: [id number] DOB: 1941-01-29  Subjective:  HPI   The patient is a 77 year old female who presents for 6 month follow up of chronic health.  She was last seen for that in February.  Since that time she was seen and treated on 07/28/18 for a UTI.  She states that she still has symptoms and would like to have it tested again.  Patient Active Problem List   Diagnosis Date Noted  . Giant cell arteritis ([place]) 08/27/2015  . A-fib ([place]) 08/27/2015  . Chest wall tenderness 08/27/2015  . Clinical depression 08/27/2015  . Blood pressure elevated 08/27/2015  . Polypharmacy 08/27/2015  . Esophagitis, reflux 08/27/2015  . Fatigue 08/27/2015  . Acid reflux 08/27/2015  . Cephalalgia 08/27/2015  . HLD (hyperlipidemia) 08/27/2015  . Adult hypothyroidism 08/27/2015  . Adaptive colitis 08/27/2015  . Malaise and fatigue 08/27/2015  . Mild major depression ([place]) 08/27/2015  . Polymyalgia rheumatica ([place]) 08/27/2015  . Cranial arteritis ([place]) 08/27/2015  . Avitaminosis D 08/27/2015    Past Medical History:  Diagnosis Date  . Cancer ([place])    skin ca  . Depression    mild major depression  . GERD (gastroesophageal reflux disease)   . Hyperlipidemia     Social History   Socioeconomic History  . Marital status: Married    Spouse name: Not on file  . Number of children: 2  . Years of education: Not on file  . Highest education level: Some college, no degree  Occupational History  . Not on file  Social Needs  . Financial resource strain: Not hard at all  . Food insecurity:    Worry: Never true    Inability: Never true  . Transportation needs:    Medical: No    Non-medical: No  Tobacco Use  . Smoking status: Never Smoker  . Smokeless tobacco: Never Used  Substance and Sexual Activity  . Alcohol use: No  . Drug use: No  . Sexual activity: Not on file  Lifestyle  . Physical activity:    Days per week: Not on file    Minutes per session: Not on file  . Stress: To  some extent  Relationships  . Social connections:    Talks on phone: Not on file    Gets together: Not on file    Attends religious service: Not on file    Active member of club or organization: Not on file    Attends meetings of clubs or organizations: Not on file    Relationship status: Not on file  . Intimate partner violence:    Fear of current or ex partner: Not on file    Emotionally abused: Not on file    Physically abused: Not on file    Forced sexual activity: Not on file  Other Topics Concern  . Not on file  Social History Narrative  . Not on file    Outpatient Encounter Medications as of 08/21/2018  Medication Sig Note  . aspirin EC 325 MG tablet Take 1 tablet by mouth daily. 08/27/2015: Per Dr. Nehemiah Massed Received from: [place]: Take by mouth.  . levothyroxine (SYNTHROID, LEVOTHROID) 50 MCG tablet TAKE 1 TABLET EVERY DAY   . metoprolol tartrate (LOPRESSOR) 25 MG tablet Take 1 tablet by mouth as needed. 08/27/2015: Medication taken as needed.  Received from: [facility]: Take by mouth.  . sertraline (ZOLOFT) 100 MG tablet TAKE 1 AND 1/2 TABLETS  EVERY DAY (NEED TO MAKE APPOINTMENT)   . [DISCONTINUED] oxybutynin (DITROPAN) 5 MG tablet Take 1 tablet (5 mg total) by mouth 2 (two) times daily. (Patient not taking: Reported on 02/21/2018)    No facility-administered encounter medications on file as of 08/21/2018.     Allergies  Allergen Reactions  . Sulfa Antibiotics Hives  . Amoxicillin Rash  . Penicillins Rash    Review of Systems  Constitutional: Negative for fever and malaise/fatigue.  Respiratory: Negative for cough and shortness of breath.   Cardiovascular: Positive for palpitations (on and off chronically, no change). Negative for chest pain, orthopnea, claudication and leg swelling.  Gastrointestinal: Negative.   Genitourinary: Positive for frequency and urgency. Negative for dysuria, flank pain and  hematuria.  Musculoskeletal: Negative.   Neurological: Negative.   Endo/Heme/Allergies: Negative.   Psychiatric/Behavioral: Negative.     Objective:  BP 134/81 (BP Location: Right Arm, Patient Position: Sitting, Cuff Size: Normal)   Pulse 80   Temp 97.9 F (36.6 C) (Oral)   Resp 16   Wt 162 lb (73.5 kg)   BMI 27.81 kg/m   Physical Exam  Constitutional: She is oriented to person, place, and time and well-developed, well-nourished, and in no distress.  HENT:  Head: Normocephalic and atraumatic.  Eyes: Conjunctivae are normal. No scleral icterus.  Neck: No thyromegaly present.  Cardiovascular: Normal rate, regular rhythm and normal heart sounds.  Pulmonary/Chest: Effort normal.  Abdominal: Soft.  No tenderness or CVAT.  Musculoskeletal: She exhibits no edema.  Neurological: She is alert and oriented to person, place, and time. Gait normal. GCS score is 15.  Skin: Skin is warm and dry.  Psychiatric: Mood, memory, affect and judgment normal.    Assessment and Plan :  1. Urinary tract infection symptoms Increase fluids--try crandberry juice and AZO. - Urine Culture  2. Chronic kidney disease, unspecified CKD stage  - Renal function panel  3. Adult hypothyroidism  - TSH  I have done the exam and reviewed the chart and it is accurate to the best of my knowledge. Development worker, community has been used and  any errors in dictation or transcription are unintentional. Miguel Aschoff M.D. [facility]

## 2018-08-22 LAB — RENAL FUNCTION PANEL
Albumin: 4.1 g/dL (ref 3.5–4.8)
BUN/Creatinine Ratio: 18 (ref 12–28)
BUN: 16 mg/dL (ref 8–27)
CO2: 23 mmol/L (ref 20–29)
Calcium: 9.4 mg/dL (ref 8.7–10.3)
Chloride: 101 mmol/L (ref 96–106)
Creatinine, Ser: 0.91 mg/dL (ref 0.57–1.00)
GFR calc Af Amer: 71 mL/min/{1.73_m2} (ref >59)
GFR calc non Af Amer: 61 mL/min/{1.73_m2} (ref >59)
Glucose: 101 mg/dL — ABNORMAL HIGH (ref 65–99)
Phosphorus: 2.7 mg/dL (ref 2.5–4.5)
Potassium: 4.6 mmol/L (ref 3.5–5.2)
Sodium: 141 mmol/L (ref 134–144)

## 2018-08-22 LAB — TSH: TSH: 6.26 u[IU]/mL — ABNORMAL HIGH (ref 0.450–4.500)

## 2018-08-23 ENCOUNTER — Telehealth: Payer: Self-pay

## 2018-08-23 DIAGNOSIS — E039 Hypothyroidism, unspecified: Secondary | ICD-10-CM

## 2018-08-23 MED ORDER — LEVOTHYROXINE SODIUM 75 MCG PO TABS: 75.0000 ug | ORAL_TABLET | Freq: Every day | ORAL | 5 refills | Status: DC

## 2018-08-23 NOTE — Telephone Encounter (Signed)
Advised patient. Medication was sent into the pharmacy.  

## 2018-08-23 NOTE — Telephone Encounter (Signed)
-----   Message from Jerrol Banana., MD sent at 08/22/2018  8:27 AM EDT ----- Increase synthroid to 75 mcg daily.

## 2018-08-25 LAB — URINE CULTURE

## 2018-08-25 LAB — SPECIMEN STATUS REPORT

## 2018-08-30 ENCOUNTER — Telehealth: Payer: Self-pay

## 2018-08-30 NOTE — Telephone Encounter (Signed)
Left message to call back  

## 2018-08-30 NOTE — Telephone Encounter (Signed)
Patient returning a call to [place]. Call patient on cell phone.

## 2018-08-30 NOTE — Telephone Encounter (Signed)
-----   Message from Jerrol Banana., MD sent at 08/29/2018  4:23 PM EDT ----- UTI--Rx regular dose septra BID for 5 days

## 2018-08-31 DIAGNOSIS — S20461A Insect bite (nonvenomous) of right back wall of thorax, initial encounter: Secondary | ICD-10-CM | POA: Diagnosis not present

## 2018-08-31 DIAGNOSIS — C44519 Basal cell carcinoma of skin of other part of trunk: Secondary | ICD-10-CM | POA: Diagnosis not present

## 2018-08-31 DIAGNOSIS — D485 Neoplasm of uncertain behavior of skin: Secondary | ICD-10-CM | POA: Diagnosis not present

## 2018-08-31 DIAGNOSIS — L570 Actinic keratosis: Secondary | ICD-10-CM | POA: Diagnosis not present

## 2018-08-31 DIAGNOSIS — C44619 Basal cell carcinoma of skin of left upper limb, including shoulder: Secondary | ICD-10-CM | POA: Diagnosis not present

## 2018-08-31 DIAGNOSIS — X32XXXA Exposure to sunlight, initial encounter: Secondary | ICD-10-CM | POA: Diagnosis not present

## 2018-08-31 DIAGNOSIS — Z08 Encounter for follow-up examination after completed treatment for malignant neoplasm: Secondary | ICD-10-CM | POA: Diagnosis not present

## 2018-08-31 DIAGNOSIS — S40861A Insect bite (nonvenomous) of right upper arm, initial encounter: Secondary | ICD-10-CM | POA: Diagnosis not present

## 2018-08-31 DIAGNOSIS — Z85828 Personal history of other malignant neoplasm of skin: Secondary | ICD-10-CM | POA: Diagnosis not present

## 2018-08-31 MED ORDER — CIPROFLOXACIN HCL 250 MG PO TABS: 250.0000 mg | ORAL_TABLET | Freq: Two times a day (BID) | ORAL | 0 refills | Status: DC

## 2018-08-31 NOTE — Telephone Encounter (Signed)
Patient advised as below. Patient is allergic to sulfa antibiotics

## 2018-08-31 NOTE — Telephone Encounter (Signed)
cipro 250 bid for 3 days 

## 2018-09-13 DIAGNOSIS — I493 Ventricular premature depolarization: Secondary | ICD-10-CM | POA: Diagnosis not present

## 2018-09-13 DIAGNOSIS — E782 Mixed hyperlipidemia: Secondary | ICD-10-CM | POA: Diagnosis not present

## 2018-09-13 DIAGNOSIS — I480 Paroxysmal atrial fibrillation: Secondary | ICD-10-CM | POA: Diagnosis not present

## 2018-10-04 ENCOUNTER — Ambulatory Visit (INDEPENDENT_AMBULATORY_CARE_PROVIDER_SITE_OTHER): Payer: Medicare Other

## 2018-10-04 DIAGNOSIS — Z23 Encounter for immunization: Secondary | ICD-10-CM

## 2018-10-11 DIAGNOSIS — C44519 Basal cell carcinoma of skin of other part of trunk: Secondary | ICD-10-CM | POA: Diagnosis not present

## 2018-10-13 DIAGNOSIS — C44619 Basal cell carcinoma of skin of left upper limb, including shoulder: Secondary | ICD-10-CM | POA: Diagnosis not present

## 2018-10-20 ENCOUNTER — Telehealth: Payer: Self-pay | Admitting: Family Medicine

## 2018-10-20 ENCOUNTER — Inpatient Hospital Stay
Admission: EM | Admit: 2018-10-20 | Discharge: 2018-10-22 | DRG: 313 | Disposition: A | Discharge status: Home / Self Care | Payer: Medicare Other | Attending: Internal Medicine | Admitting: Internal Medicine

## 2018-10-20 ENCOUNTER — Other Ambulatory Visit: Payer: Self-pay

## 2018-10-20 ENCOUNTER — Emergency Department: Payer: Medicare Other

## 2018-10-20 DIAGNOSIS — R079 Chest pain, unspecified: Secondary | ICD-10-CM | POA: Diagnosis not present

## 2018-10-20 DIAGNOSIS — R0902 Hypoxemia: Secondary | ICD-10-CM

## 2018-10-20 DIAGNOSIS — Z882 Allergy status to sulfonamides status: Secondary | ICD-10-CM

## 2018-10-20 DIAGNOSIS — Z85828 Personal history of other malignant neoplasm of skin: Secondary | ICD-10-CM

## 2018-10-20 DIAGNOSIS — I480 Paroxysmal atrial fibrillation: Secondary | ICD-10-CM | POA: Diagnosis not present

## 2018-10-20 DIAGNOSIS — E039 Hypothyroidism, unspecified: Secondary | ICD-10-CM | POA: Diagnosis not present

## 2018-10-20 DIAGNOSIS — F320 Major depressive disorder, single episode, mild: Secondary | ICD-10-CM | POA: Diagnosis not present

## 2018-10-20 DIAGNOSIS — Z880 Allergy status to penicillin: Secondary | ICD-10-CM

## 2018-10-20 DIAGNOSIS — Z7982 Long term (current) use of aspirin: Secondary | ICD-10-CM | POA: Diagnosis not present

## 2018-10-20 DIAGNOSIS — I200 Unstable angina: Secondary | ICD-10-CM | POA: Diagnosis not present

## 2018-10-20 DIAGNOSIS — R739 Hyperglycemia, unspecified: Secondary | ICD-10-CM | POA: Diagnosis not present

## 2018-10-20 DIAGNOSIS — K219 Gastro-esophageal reflux disease without esophagitis: Secondary | ICD-10-CM | POA: Diagnosis present

## 2018-10-20 DIAGNOSIS — I4891 Unspecified atrial fibrillation: Secondary | ICD-10-CM | POA: Diagnosis present

## 2018-10-20 DIAGNOSIS — Z79890 Hormone replacement therapy (postmenopausal): Secondary | ICD-10-CM

## 2018-10-20 HISTORY — DX: Unspecified atrial fibrillation: I48.91

## 2018-10-20 LAB — CBC
HCT: 39.6 % (ref 36.0–46.0)
Hemoglobin: 12.9 g/dL (ref 12.0–15.0)
MCH: 27.4 pg (ref 26.0–34.0)
MCHC: 32.6 g/dL (ref 30.0–36.0)
MCV: 84.1 fL (ref 80.0–100.0)
Platelets: 196 10*3/uL (ref 150–400)
RBC: 4.71 MIL/uL (ref 3.87–5.11)
RDW: 14.3 % (ref 11.5–15.5)
WBC: 10.2 10*3/uL (ref 4.0–10.5)
nRBC: 0 % (ref 0.0–0.2)

## 2018-10-20 LAB — HEPATIC FUNCTION PANEL
ALT: 15 U/L (ref 0–44)
AST: 22 U/L (ref 15–41)
Albumin: 4.1 g/dL (ref 3.5–5.0)
Alkaline Phosphatase: 82 U/L (ref 38–126)
Bilirubin, Direct: 0.2 mg/dL (ref 0.0–0.2)
Indirect Bilirubin: 0.6 mg/dL (ref 0.3–0.9)
Total Bilirubin: 0.8 mg/dL (ref 0.3–1.2)
Total Protein: 7 g/dL (ref 6.5–8.1)

## 2018-10-20 LAB — BASIC METABOLIC PANEL
Anion gap: 9 (ref 5–15)
BUN: 18 mg/dL (ref 8–23)
CO2: 23 mmol/L (ref 22–32)
Calcium: 9.3 mg/dL (ref 8.9–10.3)
Chloride: 107 mmol/L (ref 98–111)
Creatinine, Ser: 0.89 mg/dL (ref 0.44–1.00)
GFR calc Af Amer: >60 mL/min (ref >60)
GFR calc non Af Amer: >60 mL/min (ref >60)
Glucose, Bld: 158 mg/dL — ABNORMAL HIGH (ref 70–99)
Potassium: 3.8 mmol/L (ref 3.5–5.1)
Sodium: 139 mmol/L (ref 135–145)

## 2018-10-20 LAB — TROPONIN I
Troponin I: <0.03 ng/mL (ref <0.03)
Troponin I: <0.03 ng/mL (ref <0.03)
Troponin I: <0.03 ng/mL (ref <0.03)

## 2018-10-20 LAB — LIPASE, BLOOD: Lipase: 24 U/L (ref 11–51)

## 2018-10-20 MED ORDER — ONDANSETRON HCL 4 MG/2ML IJ SOLN: 4.0000 mg | Freq: Four times a day (QID) | INTRAMUSCULAR | Status: DC | PRN

## 2018-10-20 MED ORDER — SENNOSIDES-DOCUSATE SODIUM 8.6-50 MG PO TABS: 1.0000 | ORAL_TABLET | Freq: Every evening | ORAL | Status: DC | PRN

## 2018-10-20 MED ORDER — ACETAMINOPHEN 325 MG PO TABS: 650.0000 mg | ORAL_TABLET | ORAL | Status: DC | PRN

## 2018-10-20 MED ORDER — LEVOTHYROXINE SODIUM 75 MCG PO TABS
75.0000 ug | ORAL_TABLET | Freq: Every day | ORAL | Status: DC
  Administered 2018-10-20 – 2018-10-22 (×3): 75 ug via ORAL
  Filled 2018-10-20: qty 1
  Filled 2018-10-20: qty 3
  Filled 2018-10-20: qty 1
  Filled 2018-10-20: qty 3
  Filled 2018-10-20: qty 1
  Filled 2018-10-20: qty 3

## 2018-10-20 MED ORDER — ASPIRIN 81 MG PO CHEW: 81.0000 mg | CHEWABLE_TABLET | Freq: Every day | ORAL | Status: DC

## 2018-10-20 MED ORDER — ATORVASTATIN CALCIUM 20 MG PO TABS
40.0000 mg | ORAL_TABLET | Freq: Every day | ORAL | Status: DC
  Administered 2018-10-20 – 2018-10-21 (×2): 40 mg via ORAL
  Filled 2018-10-20 (×2): qty 2

## 2018-10-20 MED ORDER — ASPIRIN 81 MG PO CHEW
325.0000 mg | CHEWABLE_TABLET | Freq: Every day | ORAL | Status: DC
  Administered 2018-10-20 – 2018-10-22 (×3): 325 mg via ORAL
  Filled 2018-10-20 (×4): qty 5

## 2018-10-20 MED ORDER — ALUM & MAG HYDROXIDE-SIMETH 200-200-20 MG/5ML PO SUSP
30.0000 mL | Freq: Four times a day (QID) | ORAL | Status: DC | PRN
  Administered 2018-10-20: 30 mL via ORAL
  Filled 2018-10-20: qty 30

## 2018-10-20 MED ORDER — NITROGLYCERIN 0.4 MG SL SUBL: 0.4000 mg | SUBLINGUAL_TABLET | SUBLINGUAL | Status: DC | PRN

## 2018-10-20 MED ORDER — MORPHINE SULFATE (PF) 2 MG/ML IV SOLN
2.0000 mg | Freq: Once | INTRAVENOUS | Status: AC
  Administered 2018-10-20: 2 mg via INTRAVENOUS
  Filled 2018-10-20: qty 1

## 2018-10-20 MED ORDER — OXYCODONE-ACETAMINOPHEN 5-325 MG PO TABS
1.0000 | ORAL_TABLET | Freq: Four times a day (QID) | ORAL | Status: DC | PRN
  Administered 2018-10-20 (×2): 1 via ORAL
  Filled 2018-10-20 (×2): qty 1

## 2018-10-20 MED ORDER — ENOXAPARIN SODIUM 40 MG/0.4ML ~~LOC~~ SOLN
40.0000 mg | SUBCUTANEOUS | Status: DC
  Administered 2018-10-20 – 2018-10-22 (×2): 40 mg via SUBCUTANEOUS
  Filled 2018-10-20 (×2): qty 0.4

## 2018-10-20 MED ORDER — ASPIRIN 81 MG PO CHEW
324.0000 mg | CHEWABLE_TABLET | Freq: Once | ORAL | Status: AC
  Administered 2018-10-20: 324 mg via ORAL
  Filled 2018-10-20: qty 4

## 2018-10-20 MED ORDER — BISACODYL 5 MG PO TBEC
5.0000 mg | DELAYED_RELEASE_TABLET | Freq: Every day | ORAL | Status: DC | PRN
  Filled 2018-10-20: qty 1

## 2018-10-20 MED ORDER — MORPHINE SULFATE (PF) 2 MG/ML IV SOLN
2.0000 mg | INTRAVENOUS | Status: DC | PRN
  Administered 2018-10-20: 4 mg via INTRAVENOUS
  Filled 2018-10-20: qty 2

## 2018-10-20 MED ORDER — ONDANSETRON HCL 4 MG/2ML IJ SOLN
4.0000 mg | Freq: Once | INTRAMUSCULAR | Status: AC
  Administered 2018-10-20: 4 mg via INTRAVENOUS
  Filled 2018-10-20: qty 2

## 2018-10-20 MED ORDER — FAMOTIDINE IN NACL 20-0.9 MG/50ML-% IV SOLN
20.0000 mg | Freq: Once | INTRAVENOUS | Status: AC
  Administered 2018-10-20: 20 mg via INTRAVENOUS
  Filled 2018-10-20: qty 50

## 2018-10-20 MED ORDER — NITROGLYCERIN 2 % TD OINT
1.0000 [in_us] | TOPICAL_OINTMENT | Freq: Once | TRANSDERMAL | Status: AC
  Administered 2018-10-20: 1 [in_us] via TOPICAL
  Filled 2018-10-20: qty 1

## 2018-10-20 MED ORDER — NITROGLYCERIN 0.4 MG SL SUBL: 0.4000 mg | SUBLINGUAL_TABLET | SUBLINGUAL | 2 refills | Status: AC | PRN

## 2018-10-20 NOTE — Progress Notes (Signed)
Advanced Care Plan.  Purpose of Encounter: CODE STATUS. Parties in Attendance: Patient and me. Patient's Decisional Capacity: Yes. Medical Story: Tara Marquez  is a 77 y.o. female with a known history of paroxysmal Afib, skin cancer, depression, GERD and hyperlipidemia.  Patient is admitted for chest pain, rule out ACS.  I discussed with patient about her current condition, prognosis and CODE STATUS.  The patient wants to be resuscitated and intubated if she has cardiopulmonary arrest. Plan:  Code Status: Full code. Time spent discussing advance care planning: 17 minutes.

## 2018-10-20 NOTE — ED Provider Notes (Signed)
[facility] Emergency Department Provider Note   ____________________________________________   First MD Initiated Contact with Patient 10/20/18 0354     (approximate)  I have reviewed the triage vital signs and the nursing notes.   HISTORY  Chief Complaint Chest Pain    HPI Tara Marquez is a 77 y.o. female who presents to the ED from home with a chief complaint of chest pain.  Patient was at her grandson's sports game and had just walked down the bleachers when she experienced onset of chest pain approximately 9 PM.  Complains of waxing/waning pain since.  Uncomfortable and unable to sleep.  Describes cramping like sensation in her chest.  Symptoms not associated with diaphoresis, shortness of breath, nausea/vomiting, palpitations or dizziness.  History of atrial fibrillation for which she takes a full-strength aspirin daily.  Denies recent travel or trauma.   Past Medical History:  Diagnosis Date  . A-fib ([place])   . Cancer ([place])    skin ca  . Depression    mild major depression  . GERD (gastroesophageal reflux disease)   . Hyperlipidemia     Patient Active Problem List   Diagnosis Date Noted  . Chest pain 10/20/2018  . Giant cell arteritis ([place]) 08/27/2015  . A-fib ([place]) 08/27/2015  . Chest wall tenderness 08/27/2015  . Clinical depression 08/27/2015  . Blood pressure elevated 08/27/2015  . Polypharmacy 08/27/2015  . Esophagitis, reflux 08/27/2015  . Fatigue 08/27/2015  . Acid reflux 08/27/2015  . Cephalalgia 08/27/2015  . HLD (hyperlipidemia) 08/27/2015  . Adult hypothyroidism 08/27/2015  . Adaptive colitis 08/27/2015  . Malaise and fatigue 08/27/2015  . Mild major depression ([place]) 08/27/2015  . Polymyalgia rheumatica ([place]) 08/27/2015  . Cranial arteritis ([place]) 08/27/2015  . Avitaminosis D 08/27/2015    Past Surgical History:  Procedure Laterality Date  . ABDOMINAL HYSTERECTOMY  03/1971   Status Post Hysterectomy  . CATARACT  EXTRACTION Left   . TONSILLECTOMY AND ADENOIDECTOMY  1951  . [place]    Prior to Admission medications   Medication Sig Start Date End Date Taking? Authorizing Provider  aspirin EC 325 MG tablet Take 1 tablet by mouth daily.   Yes [provider]  levothyroxine (SYNTHROID, LEVOTHROID) 75 MCG tablet Take 1 tablet (75 mcg total) by mouth daily. 08/23/18  Yes Jerrol Banana., MD  metoprolol tartrate (LOPRESSOR) 25 MG tablet Take 1 tablet by mouth as needed.   Yes [provider]  sertraline (ZOLOFT) 100 MG tablet TAKE 1 AND 1/2 TABLETS EVERY DAY (NEED TO MAKE APPOINTMENT) Patient not taking: Reported on 10/20/2018 02/07/18   Jerrol Banana., MD    Allergies Sulfa antibiotics; Amoxicillin; and Penicillins  Family History  Problem Relation Age of Onset  . Cancer Sister        Has had Breast Cancer and is in remission  . Breast cancer Sister 62  . Congenital heart disease Son     Social History Social History   Tobacco Use  . Smoking status: Never Smoker  . Smokeless tobacco: Never Used  Substance Use Topics  . Alcohol use: No  . Drug use: No    Review of Systems  Constitutional: No fever/chills Eyes: No visual changes. ENT: No sore throat. Cardiovascular: Positive for chest pain. Respiratory: Denies shortness of breath. Gastrointestinal: No abdominal pain.  No nausea, no vomiting.  No diarrhea.  No constipation. Genitourinary: Negative for dysuria. Musculoskeletal: Negative for back pain. Skin: Negative for  rash. Neurological: Negative for headaches, focal weakness or numbness.   ____________________________________________   PHYSICAL EXAM:  VITAL SIGNS: ED Triage Vitals  Enc Vitals Group     BP 10/20/18 0204 (!) 154/56     Pulse Rate 10/20/18 0204 68     Resp 10/20/18 0204 18     Temp 10/20/18 0204 98.7 F (37.1 C)     Temp Source 10/20/18 0204 Oral     SpO2 10/20/18 0204 96 %     Weight 10/20/18 0202 162  lb (73.5 kg)     Height 10/20/18 0202 5\' 5"  (1.651 m)     Head Circumference --      Peak Flow --      Pain Score 10/20/18 0202 8     Pain Loc --      Pain Edu? --      Excl. in [place]? --     Constitutional: Alert and oriented. Well appearing and in mild acute distress. Eyes: Conjunctivae are normal. PERRL. EOMI. Head: Atraumatic. Nose: No congestion/rhinnorhea. Mouth/Throat: Mucous membranes are moist.  Oropharynx non-erythematous. Neck: No stridor.   Cardiovascular: Normal rate, regular rhythm. Grossly normal heart sounds.  Good peripheral circulation. Respiratory: Normal respiratory effort.  No retractions. Lungs CTAB. Gastrointestinal: Soft and nontender to light or deep palpation. No distention. No abdominal bruits. No CVA tenderness. Musculoskeletal: No lower extremity tenderness nor edema.  No joint effusions. Neurologic:  Normal speech and language. No gross focal neurologic deficits are appreciated. No gait instability. Skin:  Skin is warm, dry and intact. No rash noted. Psychiatric: Mood and affect are normal. Speech and behavior are normal.  ____________________________________________   LABS (all labs ordered are listed, but only abnormal results are displayed)  Labs Reviewed  BASIC METABOLIC PANEL - Abnormal; Notable for the following components:      Result Value   Glucose, Bld 158 (*)    All other components within normal limits  CBC  TROPONIN I  HEPATIC FUNCTION PANEL  LIPASE, BLOOD  TROPONIN I  TROPONIN I   ____________________________________________  EKG  ED ECG REPORT I, Shamika Pedregon J, the attending physician, personally viewed and interpreted this ECG.   Date: 10/20/2018  EKG Time: 0205  Rate: 71  Rhythm: normal EKG, normal sinus rhythm  Axis: Normal  Intervals:none  ST&T Change: Nonspecific  ____________________________________________  RADIOLOGY  ED MD interpretation: No acute cardiopulmonary process  Official radiology report(s): Dg  Chest 2 View  Result Date: 10/20/2018 CLINICAL DATA:  Medial chest pain EXAM: CHEST - 2 VIEW COMPARISON:  03/03/2015 FINDINGS: Linear left base atelectasis. Right lung clear. Heart is normal size. No effusions or acute bony abnormality. IMPRESSION: Left base atelectasis.  No active disease. Electronically Signed   By: Rolm Baptise M.D.   On: 10/20/2018 02:50    ____________________________________________   PROCEDURES  Procedure(s) performed: None  Procedures  Critical Care performed:   CRITICAL CARE Performed by: Paulette Blanch   Total critical care time: 30 minutes  Critical care time was exclusive of separately billable procedures and treating other patients.  Critical care was necessary to treat or prevent imminent or life-threatening deterioration.  Critical care was time spent personally by me on the following activities: development of treatment plan with patient and/or surrogate as well as nursing, discussions with consultants, evaluation of patient's response to treatment, examination of patient, obtaining history from patient or surrogate, ordering and performing treatments and interventions, ordering and review of laboratory studies, ordering and review of radiographic studies, pulse oximetry  and re-evaluation of patient's condition.  ____________________________________________   INITIAL IMPRESSION / ASSESSMENT AND PLAN / ED COURSE  As part of my medical decision making, I reviewed the following data within the [place] History obtained from family, Nursing notes reviewed and incorporated, Labs reviewed, EKG interpreted, Old chart reviewed, Radiograph reviewed and Notes from prior ED visits   77 year old female with atrial fibrillation on aspirin therapy who presents with chest pain. Differential diagnosis includes, but is not limited to, ACS, aortic dissection, pulmonary embolism, cardiac tamponade, pneumothorax, pneumonia, pericarditis, myocarditis,  GI-related causes including esophagitis/gastritis, and musculoskeletal chest wall pain.      Clinical Course as of Oct 20 646  Fri Oct 20, 2018  0607 Updated patient and spouse on all test results.  Discussed with hospitalist who will evaluate patient in the emergency department for admission.   [JS]    Clinical Course User Index [JS] Paulette Blanch, MD     ____________________________________________   FINAL CLINICAL IMPRESSION(S) / ED DIAGNOSES  Final diagnoses:  Nonspecific chest pain     ED Discharge Orders    None       Note:  This document was prepared using Dragon voice recognition software and may include unintentional dictation errors.    Paulette Blanch, MD 10/20/18 [phone]

## 2018-10-20 NOTE — H&P (Signed)
[facility] [place] NAME: Tara Marquez    MR#:  [id number]  DATE OF BIRTH:  [birth year]  DATE OF ADMISSION:  10/20/2018  PRIMARY CARE PHYSICIAN: Jerrol Banana., MD   REQUESTING/REFERRING PHYSICIAN: Paulette Blanch, MD  CHIEF COMPLAINT:   Chief Complaint  Patient presents with  . Chest Pain    HISTORY OF PRESENT ILLNESS:  Tara Marquez  is a 77 y.o. female with a known history of paroxysmal Afib (ASA325, no AC) p/w CP. AAOx3, excellent historian. Pt tells me she was at her grandson's football game on 10/19/2018 evening. @~2100PM, she states she was descending the bleachers, when she developed midchest tightness, characterized as a squeezing sensation. She denies associated SOB, palpitations, diaphoresis, N/V, LH/LOC. She states she stopped walking and stood still. The pain did not change/improve. She states the pain began to localize to the epigastrium. She thought she might be suffering from indigestion. She  went home and took some Tums, w/o improvement. Pain was persistent throughout the night, though she notes fluctuating severity of pain that does not correlate w/ activity or intake. She ultimately asked her husband to bring her to the ED for evaluation. Pt tells me she sees Dr. Nehemiah Massed of Cardiology annually, last seen 09/13/2018. She has not had this sort of chest pain the past. She has not had a stress test in the past. She tells me she would like to see Dr. Nehemiah Massed or a Cardiologist in the same group while she is hospitalized. She endorses continued chest discomfort at the time of my assessment, but is not in acute distress.  PAST MEDICAL HISTORY:   Past Medical History:  Diagnosis Date  . A-fib ([place])   . Cancer ([place])    skin ca  . Depression    mild major depression  . GERD (gastroesophageal reflux disease)   . Hyperlipidemia     PAST SURGICAL HISTORY:   Past Surgical History:  Procedure Laterality Date  . ABDOMINAL  HYSTERECTOMY  03/1971   Status Post Hysterectomy  . CATARACT EXTRACTION Left   . TONSILLECTOMY AND ADENOIDECTOMY  1951  . WISDOM TOOTH EXTRACTION  1970    SOCIAL HISTORY:   Social History   Tobacco Use  . Smoking status: Never Smoker  . Smokeless tobacco: Never Used  Substance Use Topics  . Alcohol use: No    FAMILY HISTORY:   Family History  Problem Relation Age of Onset  . Cancer Sister        Has had Breast Cancer and is in remission  . Breast cancer Sister 73  . Congenital heart disease Son     DRUG ALLERGIES:   Allergies  Allergen Reactions  . Sulfa Antibiotics Hives  . Amoxicillin Rash  . Penicillins Rash    REVIEW OF SYSTEMS:   Review of Systems  Constitutional: Negative for chills, diaphoresis, fever, malaise/fatigue and weight loss.  HENT: Negative for congestion, ear pain, hearing loss, nosebleeds, sinus pain, sore throat and tinnitus.   Eyes: Negative for blurred vision, double vision and photophobia.  Respiratory: Negative for cough, hemoptysis, sputum production, shortness of breath and wheezing.   Cardiovascular: Positive for chest pain. Negative for palpitations, orthopnea, claudication, leg swelling and PND.  Gastrointestinal: Negative for abdominal pain, blood in stool, constipation, diarrhea, heartburn, melena, nausea and vomiting.  Genitourinary: Negative for dysuria, frequency, hematuria and urgency.  Musculoskeletal: Negative for back pain, joint pain, myalgias and neck pain.  Skin: Negative for itching  and rash.  Neurological: Negative for dizziness, tingling, tremors, sensory change, speech change, focal weakness, seizures, loss of consciousness, weakness and headaches.  Psychiatric/Behavioral: Negative for memory loss. The patient does not have insomnia.    MEDICATIONS AT HOME:   Prior to Admission medications   Medication Sig Start Date End Date Taking? Authorizing Provider  aspirin EC 325 MG tablet Take 1 tablet by mouth daily.   Yes  [provider]  levothyroxine (SYNTHROID, LEVOTHROID) 75 MCG tablet Take 1 tablet (75 mcg total) by mouth daily. 08/23/18  Yes Jerrol Banana., MD  metoprolol tartrate (LOPRESSOR) 25 MG tablet Take 1 tablet by mouth as needed.   Yes [provider]  sertraline (ZOLOFT) 100 MG tablet TAKE 1 AND 1/2 TABLETS EVERY DAY (NEED TO MAKE APPOINTMENT) Patient not taking: Reported on 10/20/2018 02/07/18   Jerrol Banana., MD      VITAL SIGNS:  Blood pressure (!) 126/58, pulse 73, temperature 98.7 F (37.1 C), temperature source Oral, resp. rate 20, height 5\' 5"  (1.651 m), weight 73.5 kg, SpO2 94 %.  PHYSICAL EXAMINATION:  Physical Exam  Constitutional: She is oriented to person, place, and time. She appears well-developed and well-nourished. She is active and cooperative.  Non-toxic appearance. She does not have a sickly appearance. She does not appear ill. No distress. She is not intubated.  HENT:  Head: Normocephalic and atraumatic.  Mouth/Throat: Oropharynx is clear and moist. No oropharyngeal exudate.  Eyes: Conjunctivae, EOM and lids are normal. No scleral icterus.  Neck: Neck supple. No JVD present. No thyromegaly present.  Cardiovascular: Normal rate, regular rhythm, S1 normal and S2 normal.  No extrasystoles are present. Exam reveals no gallop, no S3, no S4, no distant heart sounds and no friction rub.  No murmur heard. Pulmonary/Chest: Effort normal and breath sounds normal. No accessory muscle usage or stridor. No apnea, no tachypnea and no bradypnea. She is not intubated. No respiratory distress. She has no decreased breath sounds. She has no wheezes. She has no rhonchi. She has no rales.  Abdominal: Soft. Bowel sounds are normal. She exhibits no distension. There is no tenderness. There is no rigidity, no rebound and no guarding.  Musculoskeletal: Normal range of motion. She exhibits no edema or tenderness.       Right lower leg: Normal. She exhibits no  tenderness and no edema.       Left lower leg: Normal. She exhibits no tenderness and no edema.  Lymphadenopathy:    She has no cervical adenopathy.  Neurological: She is alert and oriented to person, place, and time. She is not disoriented.  Skin: Skin is warm, dry and intact. No rash noted. She is not diaphoretic. No erythema. No pallor.  Psychiatric: She has a normal mood and affect. Her speech is normal and behavior is normal. Judgment and thought content normal. Her mood appears not anxious. She is not agitated. Cognition and memory are normal.   LABORATORY PANEL:   CBC Recent Labs  Lab 10/20/18 0211  WBC 10.2  HGB 12.9  HCT 39.6  PLT 196   ------------------------------------------------------------------------------------------------------------------  Chemistries  Recent Labs  Lab 10/20/18 0211  NA 139  K 3.8  CL 107  CO2 23  GLUCOSE 158*  BUN 18  CREATININE 0.89  CALCIUM 9.3  AST 22  ALT 15  ALKPHOS 82  BILITOT 0.8   ------------------------------------------------------------------------------------------------------------------  Cardiac Enzymes Recent Labs  Lab 10/20/18 0617  TROPONINI <0.03   ------------------------------------------------------------------------------------------------------------------  RADIOLOGY:  Dg  Chest 2 View  Result Date: 10/20/2018 CLINICAL DATA:  Medial chest pain EXAM: CHEST - 2 VIEW COMPARISON:  03/03/2015 FINDINGS: Linear left base atelectasis. Right lung clear. Heart is normal size. No effusions or acute bony abnormality. IMPRESSION: Left base atelectasis.  No active disease. Electronically Signed   By: Rolm Baptise M.D.   On: 10/20/2018 02:50   IMPRESSION AND PLAN:   A/P: 25F CP. Hyperglycemia. -CP: No Hx ASCVD. No Hx CP. Denies having had cardiodiagnostics (specifically stress testing) in the past. EKG (-) STEMI. Trop-I (-), rpt pending. CXR (-). Tele, cardiac monitoring. ASA, morphine, NTG, O2 PRN. NPO for  non-ambulating pharmacological nuclear stress test. Not on Statin, beta blocker, ACE-I/ARB at home. Cardiology consult at patient's request. -PAF: In sinus. c/w ASA. Not on systemic AC. -c/w home meds. -FEN/GI: NPO for now. -DVT PPx: Lovenox. -Code status: Full code. -Disposition: Observation, < 2 midnights.   All the records are reviewed and case discussed with ED provider. Management plans discussed with the patient, family and they are in agreement.  CODE STATUS: Full code.  TOTAL TIME TAKING CARE OF THIS PATIENT: 75 minutes.    Arta Silence M.D on 10/20/2018 at 9:36 AM  Between 7am to 6pm - Pager - [phone]  After 6pm go to [email or web address] - password [email or web address] [facility]  [facility]  Office  [phone]  CC: Primary care physician; Jerrol Banana., MD   Note: This dictation was prepared with Dragon dictation along with smaller phrase technology. Any transcriptional errors that result from this process are unintentional.

## 2018-10-20 NOTE — Care Management Note (Signed)
Case Management Note  Patient Details  Name: Tara Marquez MRN: [id number] Date of Birth: 11-28-1941  Subjective/Objective:    Patient from home; independent with husband.  Placed in observation for chest pain.  Troponin's negative; stress test was supposed to be done today but has been pushed to tomorrow due to scheduling.  Avoidable day entered.  Presented MOON letter to patient.  She verbalizes understanding.  Signature did not appear, but patient did sign.  Copy given to her.  Independent in all adls, denies issues accessing medical care, obtaining medications or with transportation.  Current with PCP.  No discharge needs identified at present by care manager or members of care team                    Action/Plan:   Expected Discharge Date:  10/20/18               Expected Discharge Plan:  Home/Self Care  In-House Referral:     Discharge planning Services  CM Consult  Post Acute Care Choice:    Choice offered to:     DME Arranged:    DME Agency:     HH Arranged:    [place] Agency:     Status of Service:  Completed, signed off  If discussed at [employer] of Stay Meetings, dates discussed:    Additional Comments:  Elza Rafter, RN 10/20/2018, 3:01 PM

## 2018-10-20 NOTE — ED Notes (Signed)
Patient transported to X-ray 

## 2018-10-20 NOTE — ED Triage Notes (Signed)
Patient c/o medial chest pain rated of 8 of 10 described as cramping. Patient c/o weakness. Patient denies radiation. Patient reports hx of afib.

## 2018-10-20 NOTE — Consult Note (Signed)
[facility] Cardiology Consultation Note  Patient ID: Tara Marquez, MRN: [id number], DOB/AGE: Jul 12, 1941 77 y.o. Admit date: 10/20/2018   Date of Consult: 10/20/2018 Primary Physician: Jerrol Banana., MD Primary Cardiologist: Nehemiah Massed  Chief Complaint:  Chief Complaint  Patient presents with  . Chest Pain   Reason for Consult: Chest pain  HPI: 77 y.o. female with paroxysmal nonvalvular atrial fibrillation essential hypertension mixed hyperlipidemia on appropriate previous medication management including high intensity cholesterol therapy.  Patient has had new onset of substernal chest discomfort radiating into the back with some shortness of breath lasting for several hours until she was seen in the emergency room many hours later.  At that time she had an EKG showing normal sinus rhythm with preventricular contractions.  Troponin has been normal without evidence of myocardial infarction and the patient has had relief of chest pain with morphine and other narcotics.  The patient has had no relief at this time.  Telemetry shows normal sinus rhythm she is hemodynamically stable  Past Medical History:  Diagnosis Date  . A-fib ([place])   . Cancer ([place])    skin ca  . Depression    mild major depression  . GERD (gastroesophageal reflux disease)   . Hyperlipidemia       Surgical History:  Past Surgical History:  Procedure Laterality Date  . ABDOMINAL HYSTERECTOMY  03/1971   Status Post Hysterectomy  . CATARACT EXTRACTION Left   . TONSILLECTOMY AND ADENOIDECTOMY  1951  . WISDOM TOOTH EXTRACTION  1970     Home Meds: Prior to Admission medications   Medication Sig Start Date End Date Taking? Authorizing Provider  aspirin EC 325 MG tablet Take 1 tablet by mouth daily.   Yes [provider]  levothyroxine (SYNTHROID, LEVOTHROID) 75 MCG tablet Take 1 tablet (75 mcg total) by mouth daily. 08/23/18  Yes Jerrol Banana., MD  metoprolol tartrate (LOPRESSOR) 25 MG  tablet Take 1 tablet by mouth as needed.   Yes [provider]  nitroGLYCERIN (NITROSTAT) 0.4 MG SL tablet Place 1 tablet (0.4 mg total) under the tongue every 5 (five) minutes as needed for chest pain. 10/20/18   Demetrios Loll, MD  sertraline (ZOLOFT) 100 MG tablet TAKE 1 AND 1/2 TABLETS EVERY DAY (NEED TO MAKE APPOINTMENT) Patient not taking: Reported on 10/20/2018 02/07/18   Jerrol Banana., MD    Inpatient Medications:  . aspirin  325 mg Oral Daily  . atorvastatin  40 mg Oral q1800  . enoxaparin (LOVENOX) injection  40 mg Subcutaneous Q24H  . levothyroxine  75 mcg Oral Daily     Allergies:  Allergies  Allergen Reactions  . Sulfa Antibiotics Hives  . Amoxicillin Rash  . Penicillins Rash    Social History   Socioeconomic History  . Marital status: Married    Spouse name: Not on file  . Number of children: 2  . Years of education: Not on file  . Highest education level: Some college, no degree  Occupational History  . Not on file  Social Needs  . Financial resource strain: Not hard at all  . Food insecurity:    Worry: Never true    Inability: Never true  . Transportation needs:    Medical: No    Non-medical: No  Tobacco Use  . Smoking status: Never Smoker  . Smokeless tobacco: Never Used  Substance and Sexual Activity  . Alcohol use: No  . Drug use: No  . Sexual activity: Yes  Lifestyle  . Physical activity:    Days per week: Not on file    Minutes per session: Not on file  . Stress: To some extent  Relationships  . Social connections:    Talks on phone: Not on file    Gets together: Not on file    Attends religious service: Not on file    Active member of club or organization: Not on file    Attends meetings of clubs or organizations: Not on file    Relationship status: Not on file  . Intimate partner violence:    Fear of current or ex partner: Not on file    Emotionally abused: Not on file    Physically abused: Not on file    Forced  sexual activity: Not on file  Other Topics Concern  . Not on file  Social History Narrative  . Not on file     Family History  Problem Relation Age of Onset  . Cancer Sister        Has had Breast Cancer and is in remission  . Breast cancer Sister 75  . Congenital heart disease Son      Review of Systems Positive for chest pain Negative for: General:  chills, fever, night sweats or weight changes.  Cardiovascular: PND orthopnea syncope dizziness  Dermatological skin lesions rashes Respiratory: Cough congestion Urologic: Frequent urination urination at night and hematuria Abdominal: negative for nausea, vomiting, diarrhea, bright red blood per rectum, melena, or hematemesis Neurologic: negative for visual changes, and/or hearing changes  All other systems reviewed and are otherwise negative except as noted above.  Labs: Recent Labs    10/20/18 0211 10/20/18 0617 10/20/18 1036  TROPONINI <0.03 <0.03 <0.03   Lab Results  Component Value Date   WBC 10.2 10/20/2018   HGB 12.9 10/20/2018   HCT 39.6 10/20/2018   MCV 84.1 10/20/2018   PLT 196 10/20/2018    Recent Labs  Lab 10/20/18 0211  NA 139  K 3.8  CL 107  CO2 23  BUN 18  CREATININE 0.89  CALCIUM 9.3  PROT 7.0  BILITOT 0.8  ALKPHOS 82  ALT 15  AST 22  GLUCOSE 158*   Lab Results  Component Value Date   CHOL 208 (H) 02/21/2018   HDL 55 02/21/2018   LDLCALC 129 (H) 02/21/2018   TRIG 122 02/21/2018   No results found for: DDIMER  Radiology/Studies:  Dg Chest 2 View  Result Date: 10/20/2018 CLINICAL DATA:  Medial chest pain EXAM: CHEST - 2 VIEW COMPARISON:  03/03/2015 FINDINGS: Linear left base atelectasis. Right lung clear. Heart is normal size. No effusions or acute bony abnormality. IMPRESSION: Left base atelectasis.  No active disease. Electronically Signed   By: Rolm Baptise M.D.   On: 10/20/2018 02:50    EKG: Normal sinus rhythm  Weights: Filed Weights   10/20/18 0202  Weight: 73.5 kg      Physical Exam: Blood pressure (!) 126/58, pulse 73, temperature 98.7 F (37.1 C), temperature source Oral, resp. rate 20, height 5\' 5"  (1.651 m), weight 73.5 kg, SpO2 94 %. Body mass index is 26.96 kg/m. General: Well developed, well nourished, in no acute distress. Head eyes ears nose throat: Normocephalic, atraumatic, sclera non-icteric, no xanthomas, nares are without discharge. No apparent thyromegaly and/or mass  Lungs: Normal respiratory effort.  no wheezes, no rales, no rhonchi.  Heart: RRR with normal S1 S2. no murmur gallop, no rub, PMI is normal size and placement, carotid upstroke normal  without bruit, jugular venous pressure is normal Abdomen: Soft, non-tender, non-distended with normoactive bowel sounds. No hepatomegaly. No rebound/guarding. No obvious abdominal masses. Abdominal aorta is normal size without bruit Extremities: No edema. no cyanosis, no clubbing, no ulcers  Peripheral : 2+ bilateral upper extremity pulses, 2+ bilateral femoral pulses, 2+ bilateral dorsal pedal pulse Neuro: Alert and oriented. No facial asymmetry. No focal deficit. Moves all extremities spontaneously. Musculoskeletal: Normal muscle tone without kyphosis Psych:  Responds to questions appropriately with a normal affect.    Assessment: 77 year old female with essential hypertension mixed hyperlipidemia with atypical chest discomfort without evidence of myocardial infarction EKG changes and/or congestive heart failure  Plan: 1.  Continue high intensity cholesterol therapy 2.  Aspirin for further risk reduction of symptoms 3.  Nitrates if able which may help possibly if caused by cardiovascular disease 4.  Narcotics for probable inflammatory symptoms of chest 5.  Begin ambulation and follow for improvements of symptoms and possible discharged home with follow-up next week for further treatment options no further symptoms occur  Signed, Corey Skains M.D. [facility]  Cardiology 10/20/2018, 4:04 PM

## 2018-10-20 NOTE — Care Management Important Message (Signed)
Important Message  Patient Details  Name: TAJANA CROTTEAU MRN: [id number] Date of Birth: 1941/04/26   Medicare Important Message Given:  Yes    Elza Rafter, RN 10/20/2018, 2:51 PM

## 2018-10-20 NOTE — Progress Notes (Signed)
The patient complains of chest pain in substernal area and epigastric pain. Vital signs and lab reviewed Physical examinations is unremarkable except tenderness in substernal area on palpation. Troponins are normal.  Waiting for stress test. Continue current treatment, follow-up stress test in a.m. and cardiology consult.  I discussed with the patient and RN.  Time spent about 20 minutes.

## 2018-10-20 NOTE — Telephone Encounter (Signed)
Pt is scheduled for hospital f/u on 10/26/18 and being discharged today. Thanks TNP

## 2018-10-21 ENCOUNTER — Inpatient Hospital Stay: Payer: Medicare Other

## 2018-10-21 ENCOUNTER — Observation Stay
Admit: 2018-10-21 | Discharge: 2018-10-21 | Disposition: A | Discharge status: Home / Self Care | Payer: Medicare Other | Attending: Internal Medicine | Admitting: Internal Medicine

## 2018-10-21 DIAGNOSIS — R079 Chest pain, unspecified: Secondary | ICD-10-CM | POA: Diagnosis not present

## 2018-10-21 DIAGNOSIS — Z85828 Personal history of other malignant neoplasm of skin: Secondary | ICD-10-CM | POA: Diagnosis not present

## 2018-10-21 DIAGNOSIS — I480 Paroxysmal atrial fibrillation: Secondary | ICD-10-CM | POA: Diagnosis present

## 2018-10-21 DIAGNOSIS — Z79890 Hormone replacement therapy (postmenopausal): Secondary | ICD-10-CM | POA: Diagnosis not present

## 2018-10-21 DIAGNOSIS — J90 Pleural effusion, not elsewhere classified: Secondary | ICD-10-CM | POA: Diagnosis not present

## 2018-10-21 DIAGNOSIS — Z7982 Long term (current) use of aspirin: Secondary | ICD-10-CM | POA: Diagnosis not present

## 2018-10-21 DIAGNOSIS — F320 Major depressive disorder, single episode, mild: Secondary | ICD-10-CM | POA: Diagnosis present

## 2018-10-21 DIAGNOSIS — Z882 Allergy status to sulfonamides status: Secondary | ICD-10-CM | POA: Diagnosis not present

## 2018-10-21 DIAGNOSIS — I4891 Unspecified atrial fibrillation: Secondary | ICD-10-CM | POA: Diagnosis not present

## 2018-10-21 DIAGNOSIS — Z880 Allergy status to penicillin: Secondary | ICD-10-CM | POA: Diagnosis not present

## 2018-10-21 DIAGNOSIS — E039 Hypothyroidism, unspecified: Secondary | ICD-10-CM | POA: Diagnosis present

## 2018-10-21 DIAGNOSIS — R739 Hyperglycemia, unspecified: Secondary | ICD-10-CM | POA: Diagnosis not present

## 2018-10-21 DIAGNOSIS — K219 Gastro-esophageal reflux disease without esophagitis: Secondary | ICD-10-CM | POA: Diagnosis not present

## 2018-10-21 LAB — NM MYOCAR MULTI W/SPECT W/WALL MOTION / EF
Estimated workload: 1 METS
Exercise duration (min): 1 min
Exercise duration (sec): 1 s
LV dias vol: 21 mL (ref 46–106)
LV sys vol: 7 mL
MPHR: 144 {beats}/min
Peak HR: 179 {beats}/min
Percent HR: 124 %
Rest HR: 144 {beats}/min
SDS: 2
SRS: 4
SSS: 6
TID: 1

## 2018-10-21 LAB — TSH: TSH: 0.622 u[IU]/mL (ref 0.350–4.500)

## 2018-10-21 LAB — ECHOCARDIOGRAM COMPLETE
Height: 65 in
Weight: 2592 oz

## 2018-10-21 LAB — TROPONIN I
Troponin I: <0.03 ng/mL (ref <0.03)
Troponin I: <0.03 ng/mL (ref <0.03)
Troponin I: <0.03 ng/mL (ref <0.03)

## 2018-10-21 MED ORDER — AMIODARONE HCL IN DEXTROSE 360-4.14 MG/200ML-% IV SOLN
30.0000 mg/h | INTRAVENOUS | Status: DC
  Administered 2018-10-21 – 2018-10-22 (×2): 30 mg/h via INTRAVENOUS
  Filled 2018-10-21: qty 200

## 2018-10-21 MED ORDER — AMIODARONE HCL IN DEXTROSE 360-4.14 MG/200ML-% IV SOLN
60.0000 mg/h | INTRAVENOUS | Status: AC
  Administered 2018-10-21 (×2): 60 mg/h via INTRAVENOUS
  Filled 2018-10-21: qty 200

## 2018-10-21 MED ORDER — REGADENOSON 0.4 MG/5ML IV SOLN
0.4000 mg | Freq: Once | INTRAVENOUS | Status: AC
  Administered 2018-10-21: 0.4 mg via INTRAVENOUS

## 2018-10-21 MED ORDER — TECHNETIUM TC 99M TETROFOSMIN IV KIT
10.5800 | PACK | Freq: Once | INTRAVENOUS | Status: AC | PRN
  Administered 2018-10-21: 10.58 via INTRAVENOUS

## 2018-10-21 MED ORDER — TECHNETIUM TC 99M TETROFOSMIN IV KIT
31.4500 | PACK | Freq: Once | INTRAVENOUS | Status: AC | PRN
  Administered 2018-10-21: 31.45 via INTRAVENOUS

## 2018-10-21 MED ORDER — DILTIAZEM HCL-DEXTROSE 100-5 MG/100ML-% IV SOLN (PREMIX)
5.0000 mg/h | INTRAVENOUS | Status: DC
  Administered 2018-10-21: 5 mg/h via INTRAVENOUS
  Filled 2018-10-21: qty 100

## 2018-10-21 NOTE — Progress Notes (Signed)
MD paged at 12:00 noon CCMD called patient is in Afib with heart rate sustained in the 160s.  MD notified and charge nurse.  Phillis Knack, RN

## 2018-10-21 NOTE — Progress Notes (Signed)
Pt HR at this time NSR in the 70's. Amiodarone drip at 16.7 ml/h.

## 2018-10-21 NOTE — Progress Notes (Signed)
Cardizem was ordered by MD; then cardiologist ordered Amiodarone.  Cardizem was stooped and Amiodarone was begun at 33.3 mL/hr as ordered by cardiology. MD said to do as ordered by cardiology.  Heart rate and BP are being monitored.  Phillis Knack, RN

## 2018-10-21 NOTE — Progress Notes (Signed)
[facility] [place] NAME: Tara Marquez    MR#:  [id number]  DATE OF BIRTH:  Dec 04, 1941  SUBJECTIVE:  CHIEF COMPLAINT:   Chief Complaint  Patient presents with  . Chest Pain   Came with chest pain, seen by cardiologist and stress test was done today. After stress test she is noted to have A. fib with RVR.  Patient was asymptomatic.  Blood pressure stable.  REVIEW OF SYSTEMS:  CONSTITUTIONAL: No fever, fatigue or weakness.  EYES: No blurred or double vision.  EARS, NOSE, AND THROAT: No tinnitus or ear pain.  RESPIRATORY: No cough, shortness of breath, wheezing or hemoptysis.  CARDIOVASCULAR: No chest pain, orthopnea, edema.  GASTROINTESTINAL: No nausea, vomiting, diarrhea or abdominal pain.  GENITOURINARY: No dysuria, hematuria.  ENDOCRINE: No polyuria, nocturia,  HEMATOLOGY: No anemia, easy bruising or bleeding SKIN: No rash or lesion. MUSCULOSKELETAL: No joint pain or arthritis.   NEUROLOGIC: No tingling, numbness, weakness.  PSYCHIATRY: No anxiety or depression.   ROS  DRUG ALLERGIES:   Allergies  Allergen Reactions  . Sulfa Antibiotics Hives  . Amoxicillin Rash  . Penicillins Rash    VITALS:  Blood pressure (!) 134/107, pulse (!) 145, temperature 99.8 F (37.7 C), temperature source Oral, resp. rate 16, height 5\' 5"  (1.651 m), weight 73.5 kg, SpO2 95 %.  PHYSICAL EXAMINATION:  GENERAL:  77 y.o.-year-old patient lying in the bed with no acute distress.  EYES: Pupils equal, round, reactive to light and accommodation. No scleral icterus. Extraocular muscles intact.  HEENT: Head atraumatic, normocephalic. Oropharynx and nasopharynx clear.  NECK:  Supple, no jugular venous distention. No thyroid enlargement, no tenderness.  LUNGS: Normal breath sounds bilaterally, no wheezing, rales,rhonchi or crepitation. No use of accessory muscles of respiration.  CARDIOVASCULAR: S1, S2 fast and regular. No murmurs, rubs, or gallops.   ABDOMEN: Soft, nontender, nondistended. Bowel sounds present. No organomegaly or mass.  EXTREMITIES: No pedal edema, cyanosis, or clubbing.  NEUROLOGIC: Cranial nerves II through XII are intact. Muscle strength 5/5 in all extremities. Sensation intact. Gait not checked.  PSYCHIATRIC: The patient is alert and oriented x 3.  SKIN: No obvious rash, lesion, or ulcer.   Physical Exam LABORATORY PANEL:   CBC Recent Labs  Lab 10/20/18 0211  WBC 10.2  HGB 12.9  HCT 39.6  PLT 196   ------------------------------------------------------------------------------------------------------------------  Chemistries  Recent Labs  Lab 10/20/18 0211  NA 139  K 3.8  CL 107  CO2 23  GLUCOSE 158*  BUN 18  CREATININE 0.89  CALCIUM 9.3  AST 22  ALT 15  ALKPHOS 82  BILITOT 0.8   ------------------------------------------------------------------------------------------------------------------  Cardiac Enzymes Recent Labs  Lab 10/20/18 0617 10/20/18 1036  TROPONINI <0.03 <0.03   ------------------------------------------------------------------------------------------------------------------  RADIOLOGY:  Dg Chest 2 View  Result Date: 10/20/2018 CLINICAL DATA:  Medial chest pain EXAM: CHEST - 2 VIEW COMPARISON:  03/03/2015 FINDINGS: Linear left base atelectasis. Right lung clear. Heart is normal size. No effusions or acute bony abnormality. IMPRESSION: Left base atelectasis.  No active disease. Electronically Signed   By: Rolm Baptise M.D.   On: 10/20/2018 02:50    ASSESSMENT AND PLAN:   Active Problems:   Chest pain  *Chest pain Seen by cardiologist and requested to have a stress test which is done. 3 troponins are negative. Continue aspirin, atorvastatin. She can not tolerate beta-blocker due to borderline blood pressure and dizziness. Awaited results of stress test.  *A. fib with RVR I had initially  ordered to start on IV Cardizem drip. Cardiology suggested amiodarone IV  drip, stop Cardizem and start on amiodarone drip. Echocardiogram.  *Hypothyroidism Continue levothyroxine and check for TSH level.    All the records are reviewed and case discussed with Care Management/Social Workerr. Management plans discussed with the patient, family and they are in agreement.  CODE STATUS: Full code.  TOTAL TIME TAKING CARE OF THIS PATIENT: 35 minutes.     POSSIBLE D/C IN 1-2 DAYS, DEPENDING ON CLINICAL CONDITION.   Vaughan Basta M.D on 10/21/2018   Between 7am to 6pm - Pager - [phone]  After 6pm go to [email or web address] - password [email or web address] [place]  Office  [phone]  CC: Primary care physician; Jerrol Banana., MD  Note: This dictation was prepared with Dragon dictation along with smaller phrase technology. Any transcriptional errors that result from this process are unintentional.

## 2018-10-21 NOTE — Progress Notes (Signed)
SUBJECTIVE: Patient was admitted yesterday with chest pain and was in sinus rhythm.  Today he is not having chest pain but during [place] patient was in atrial fibrillation with rapid ventricular response rate about 146 bpm.   Vitals:   10/20/18 1649 10/20/18 2102 10/21/18 0520 10/21/18 1306  BP: (!) 129/52 117/77 (!) 146/69 (!) 134/107  Pulse: 74 79 90 (!) 145  Resp:   16   Temp: 99 F (37.2 C) 99.1 F (37.3 C) 99.8 F (37.7 C)   TempSrc: Oral Oral Oral   SpO2: 92% 92% (!) 88% 95%  Weight:      Height:        Intake/Output Summary (Last 24 hours) at 10/21/2018 1307 Last data filed at 10/20/2018 1816 Gross per 24 hour  Intake 240 ml  Output -  Net 240 ml    LABS: Basic Metabolic Panel: Recent Labs    10/20/18 0211  NA 139  K 3.8  CL 107  CO2 23  GLUCOSE 158*  BUN 18  CREATININE 0.89  CALCIUM 9.3   Liver Function Tests: Recent Labs    10/20/18 0211  AST 22  ALT 15  ALKPHOS 82  BILITOT 0.8  PROT 7.0  ALBUMIN 4.1   Recent Labs    10/20/18 0211  LIPASE 24   CBC: Recent Labs    10/20/18 0211  WBC 10.2  HGB 12.9  HCT 39.6  MCV 84.1  PLT 196   Cardiac Enzymes: Recent Labs    10/20/18 0211 10/20/18 0617 10/20/18 1036  TROPONINI <0.03 <0.03 <0.03   BNP: Invalid input(s): POCBNP D-Dimer: No results for input(s): DDIMER in the last 72 hours. Hemoglobin A1C: No results for input(s): HGBA1C in the last 72 hours. Fasting Lipid Panel: No results for input(s): CHOL, HDL, LDLCALC, TRIG, CHOLHDL, LDLDIRECT in the last 72 hours. Thyroid Function Tests: No results for input(s): TSH, T4TOTAL, T3FREE, THYROIDAB in the last 72 hours.  Invalid input(s): FREET3 Anemia Panel: No results for input(s): VITAMINB12, FOLATE, FERRITIN, TIBC, IRON, RETICCTPCT in the last 72 hours.   PHYSICAL EXAM General: Well developed, well nourished, in no acute distress HEENT:  Normocephalic and atramatic Neck:  No JVD.  Lungs: Clear bilaterally to  auscultation and percussion. Heart: HRRR . Normal S1 and S2 without gallops or murmurs.  Abdomen: Bowel sounds are positive, abdomen soft and non-tender  Msk:  Back normal, normal gait. Normal strength and tone for age. Extremities: No clubbing, cyanosis or edema.   Neuro: Alert and oriented X 3. Psych:  Good affect, responds appropriately  TELEMETRY: Atrial fibrillation with rapid ventricular response rate  ASSESSMENT AND PLAN: Chest pain with new onset atrial fibrillation with rapid ventricular response rate.  It appears patient was in sinus rhythm yesterday when she was admitted with chest pain.  During Lexiscan Myoview was found to be in atrial fibrillation.  Patient underwent Lexiscan Myoview and results are pending.  Echocardiogram is also pending.  But due to new onset atrial fibrillation will start amiodarone drip.  Patient has problem taking metoprolol as feels dizzy.  Active Problems:   Chest pain    KHAN,SHAUKAT A, MD, [facility] 10/21/2018 1:07 PM

## 2018-10-21 NOTE — Care Management Obs Status (Signed)
[place] NOTIFICATION   Patient Details  Name: Tara Marquez MRN: [id number] Date of Birth: May 23, 1941   Medicare Observation Status Notification Given:  Yes  Given on 10/25 by Geoffry Paradise, she charted IM given instead of MOON, MOON at bedside.  Latanya Maudlin, RN 10/21/2018, 2:04 PM

## 2018-10-21 NOTE — Consult Note (Signed)
  Amiodarone Drug - Drug Interaction Consult Note  Recommendations:  Amiodarone is metabolized by the cytochrome P450 system and therefore has the potential to cause many drug interactions. Amiodarone has an average plasma half-life of 50 days (range 20 to 100 days).   There is potential for drug interactions to occur several weeks or months after stopping treatment and the onset of drug interactions may be slow after initiating amiodarone.   [x]  Statins: Increased risk of myopathy. Simvastatin- restrict dose to 20mg  daily. Other statins: counsel patients to report any muscle pain or weakness immediately.  []  Beta blockers: increased risk of bradycardia, AV block and myocardial depression. Sotalol - avoid concomitant use.  Thank You,  Forrest Moron, PharmD 10/21/2018 1:35 PM

## 2018-10-22 ENCOUNTER — Inpatient Hospital Stay: Payer: Medicare Other

## 2018-10-22 LAB — CBC
HCT: 36.8 % (ref 36.0–46.0)
Hemoglobin: 12.2 g/dL (ref 12.0–15.0)
MCH: 27.9 pg (ref 26.0–34.0)
MCHC: 33.2 g/dL (ref 30.0–36.0)
MCV: 84 fL (ref 80.0–100.0)
Platelets: 156 K/uL (ref 150–400)
RBC: 4.38 MIL/uL (ref 3.87–5.11)
RDW: 14.3 % (ref 11.5–15.5)
WBC: 8.3 K/uL (ref 4.0–10.5)
nRBC: 0 % (ref 0.0–0.2)

## 2018-10-22 MED ORDER — AMIODARONE HCL 200 MG PO TABS
400.0000 mg | ORAL_TABLET | Freq: Two times a day (BID) | ORAL | Status: DC
  Administered 2018-10-22: 400 mg via ORAL
  Filled 2018-10-22: qty 2

## 2018-10-22 MED ORDER — ATORVASTATIN CALCIUM 40 MG PO TABS: 40.0000 mg | ORAL_TABLET | Freq: Every day | ORAL | 0 refills | Status: AC

## 2018-10-22 MED ORDER — AMIODARONE HCL 200 MG PO TABS: 200.0000 mg | ORAL_TABLET | Freq: Two times a day (BID) | ORAL | 0 refills | Status: DC

## 2018-10-22 MED ORDER — AMIODARONE HCL 400 MG PO TABS: 400.0000 mg | ORAL_TABLET | Freq: Two times a day (BID) | ORAL | 0 refills | Status: DC

## 2018-10-22 MED ORDER — RIVAROXABAN 20 MG PO TABS
20.0000 mg | ORAL_TABLET | Freq: Every day | ORAL | Status: DC
  Administered 2018-10-22: 20 mg via ORAL
  Filled 2018-10-22: qty 1

## 2018-10-22 MED ORDER — RIVAROXABAN 20 MG PO TABS: 20.0000 mg | ORAL_TABLET | Freq: Every day | ORAL | 0 refills | Status: DC

## 2018-10-22 NOTE — Discharge Summary (Signed)
[facility] [place] NAME: Tara Marquez    MR#:  [id number]  DATE OF BIRTH:  December 12, 1941  DATE OF ADMISSION:  10/20/2018 ADMITTING PHYSICIAN: Arta Silence, MD  DATE OF DISCHARGE: 10/22/2018   PRIMARY CARE PHYSICIAN: Jerrol Banana., MD    ADMISSION DIAGNOSIS:  Nonspecific chest pain [R07.9]  DISCHARGE DIAGNOSIS:  Active Problems:   A-fib (HCC)   Chest pain   SECONDARY DIAGNOSIS:   Past Medical History:  Diagnosis Date  . A-fib ([place])   . Cancer ([place])    skin ca  . Depression    mild major depression  . GERD (gastroesophageal reflux disease)   . Hyperlipidemia     HOSPITAL COURSE:    *Chest pain Seen by cardiologist and requested to have a stress test which is done- negative. 3 troponins are negative. Continue  atorvastatin. She can not tolerate beta-blocker due to borderline blood pressure and dizziness. Advised to follow in cardio clinic in next week, no pain now.  *A. fib with [facility] Cardiology suggested amiodarone IV drip, next day - she is converted to sinus rhythm- switched to oral amiodarone- d/c with amio 400 mg bid dose and then change to 200 mg BID as per cardio, follow in clinic in 1 week. Echocardiogram done, TSH normal.  *Hypothyroidism Continue levothyroxine and checked for TSH level.  * She had atelactesis and some effusion on left lung, no complains of cough/ SOB/ fever. No further needs, advised to use incentive spirometer.  Discharge today.   DISCHARGE CONDITIONS:   Stable.  CONSULTS OBTAINED:  Treatment Team:  Arta Silence, MD Corey Skains, MD Dionisio David, MD  DRUG ALLERGIES:   Allergies  Allergen Reactions  . Sulfa Antibiotics Hives  . Amoxicillin Rash  . Penicillins Rash    DISCHARGE MEDICATIONS:   Allergies as of 10/22/2018      Reactions   Sulfa Antibiotics Hives   Amoxicillin Rash   Penicillins Rash      Medication List    STOP  taking these medications   aspirin EC 325 MG tablet     TAKE these medications   amiodarone 400 MG tablet Commonly known as:  PACERONE Take 1 tablet (400 mg total) by mouth 2 (two) times daily for 3 days.   amiodarone 200 MG tablet Commonly known as:  PACERONE Take 1 tablet (200 mg total) by mouth 2 (two) times daily. Start taking on:  10/26/2018   atorvastatin 40 MG tablet Commonly known as:  LIPITOR Take 1 tablet (40 mg total) by mouth daily at 6 PM.   levothyroxine 75 MCG tablet Commonly known as:  SYNTHROID, LEVOTHROID Take 1 tablet (75 mcg total) by mouth daily.   metoprolol tartrate 25 MG tablet Commonly known as:  LOPRESSOR Take 1 tablet by mouth as needed.   nitroGLYCERIN 0.4 MG SL tablet Commonly known as:  NITROSTAT Place 1 tablet (0.4 mg total) under the tongue every 5 (five) minutes as needed for chest pain.   rivaroxaban 20 MG Tabs tablet Commonly known as:  XARELTO Take 1 tablet (20 mg total) by mouth daily with supper.   sertraline 100 MG tablet Commonly known as:  ZOLOFT TAKE 1 AND 1/2 TABLETS EVERY DAY (NEED TO MAKE APPOINTMENT)        DISCHARGE INSTRUCTIONS:    Follow with cardio clinic in 1 week.  If you experience worsening of your admission symptoms, develop shortness of breath, life threatening emergency, suicidal or  homicidal thoughts you must seek medical attention immediately by calling 911 or calling your MD immediately  if symptoms less severe.  You Must read complete instructions/literature along with all the possible adverse reactions/side effects for all the Medicines you take and that have been prescribed to you. Take any new Medicines after you have completely understood and accept all the possible adverse reactions/side effects.   Please note  You were cared for by a hospitalist during your hospital stay. If you have any questions about your discharge medications or the care you received while you were in the hospital after you are  discharged, you can call the unit and asked to speak with the hospitalist on call if the hospitalist that took care of you is not available. Once you are discharged, your primary care physician will handle any further medical issues. Please note that NO REFILLS for any discharge medications will be authorized once you are discharged, as it is imperative that you return to your primary care physician (or establish a relationship with a primary care physician if you do not have one) for your aftercare needs so that they can reassess your need for medications and monitor your lab values.    Today   CHIEF COMPLAINT:   Chief Complaint  Patient presents with  . Chest Pain    HISTORY OF PRESENT ILLNESS:  Tara Marquez  is a 77 y.o. female with a known history of paroxysmal Afib (ASA325, no AC) p/w CP. AAOx3, excellent historian. Pt tells me she was at her grandson's football game on 10/19/2018 evening. @~2100PM, she states she was descending the bleachers, when she developed midchest tightness, characterized as a squeezing sensation. She denies associated SOB, palpitations, diaphoresis, N/V, LH/LOC. She states she stopped walking and stood still. The pain did not change/improve. She states the pain began to localize to the epigastrium. She thought she might be suffering from indigestion. She  went home and took some Tums, w/o improvement. Pain was persistent throughout the night, though she notes fluctuating severity of pain that does not correlate w/ activity or intake. She ultimately asked her husband to bring her to the ED for evaluation. Pt tells me she sees Dr. Nehemiah Massed of Cardiology annually, last seen 09/13/2018. She has not had this sort of chest pain the past. She has not had a stress test in the past. She tells me she would like to see Dr. Nehemiah Massed or a Cardiologist in the same group while she is hospitalized. She endorses continued chest discomfort at the time of my assessment, but is not in acute  distress.   VITAL SIGNS:  Blood pressure (!) 121/53, pulse 73, temperature 98.6 F (37 C), resp. rate 18, height 5\' 5"  (1.651 m), weight 73.5 kg, SpO2 96 %.  I/O:    Intake/Output Summary (Last 24 hours) at 10/22/2018 1247 Last data filed at 10/22/2018 1032 Gross per 24 hour  Intake 1200 ml  Output 800 ml  Net 400 ml    PHYSICAL EXAMINATION:  GENERAL:  77 y.o.-year-old patient lying in the bed with no acute distress.  EYES: Pupils equal, round, reactive to light and accommodation. No scleral icterus. Extraocular muscles intact.  HEENT: Head atraumatic, normocephalic. Oropharynx and nasopharynx clear.  NECK:  Supple, no jugular venous distention. No thyroid enlargement, no tenderness.  LUNGS: decreased breath sound on left lower side, no wheezing, rales,rhonchi or crepitation. No use of accessory muscles of respiration.  CARDIOVASCULAR: S1, S2 normal. No murmurs, rubs, or gallops.  ABDOMEN: Soft,  non-tender, non-distended. Bowel sounds present. No organomegaly or mass.  EXTREMITIES: No pedal edema, cyanosis, or clubbing.  NEUROLOGIC: Cranial nerves II through XII are intact. Muscle strength 5/5 in all extremities. Sensation intact. Gait not checked.  PSYCHIATRIC: The patient is alert and oriented x 3.  SKIN: No obvious rash, lesion, or ulcer.   DATA REVIEW:   CBC Recent Labs  Lab 10/22/18 1136  WBC 8.3  HGB 12.2  HCT 36.8  PLT 156    Chemistries  Recent Labs  Lab 10/20/18 0211  NA 139  K 3.8  CL 107  CO2 23  GLUCOSE 158*  BUN 18  CREATININE 0.89  CALCIUM 9.3  AST 22  ALT 15  ALKPHOS 82  BILITOT 0.8    Cardiac Enzymes Recent Labs  Lab 10/21/18 2010  TROPONINI <0.03    Microbiology Results  Results for orders placed or performed in visit on 08/21/18  Urine Culture     Status: Abnormal   Collection Time: 08/21/18 12:00 AM  Result Value Ref Range Status   Urine Culture, Routine Final report (A)  Final   Organism ID, Bacteria Klebsiella pneumoniae  (A)  Final    Comment: Greater than 100,000 colony forming units per mL Cefazolin <=4 ug/mL Cefazolin with an MIC <=16 predicts susceptibility to the oral agents cefaclor, cefdinir, cefpodoxime, cefprozil, cefuroxime, cephalexin, and loracarbef when used for therapy of uncomplicated urinary tract infections due to E. coli, Klebsiella pneumoniae, and Proteus mirabilis.    Antimicrobial Susceptibility Comment  Final    Comment:       ** S = Susceptible; I = Intermediate; R = Resistant **                    P = Positive; N = Negative             MICS are expressed in micrograms per mL    Antibiotic                 RSLT#1    RSLT#2    RSLT#3    RSLT#4 Amoxicillin/Clavulanic Acid    S Ampicillin                     R Cefepime                       S Ceftriaxone                    S Cefuroxime                     S Ciprofloxacin                  S Ertapenem                      S Gentamicin                     S Imipenem                       S Levofloxacin                   S Meropenem                      S Nitrofurantoin  I Piperacillin/Tazobactam        S Tetracycline                   S Tobramycin                     S Trimethoprim/Sulfa             S     RADIOLOGY:  Dg Chest 2 View  Result Date: 10/22/2018 CLINICAL DATA:  Hypoxia. Atrial fibrillation. EXAM: CHEST - 2 VIEW COMPARISON:  10/20/2018. FINDINGS: Normal sized heart. Minimal pleural fluid or atelectasis at the posterior right lung base. Increased patchy and linear density at the left lung base with an interval small to moderate-sized left pleural effusion. Minimal scoliosis. Right humeral neck bone island without significant change. The lungs remain mildly hyperexpanded with mild diffuse peribronchial thickening. IMPRESSION: 1. Interval small to moderate-sized left pleural effusion. 2. Increased left basilar atelectasis and possible pneumonia. 3. Minimal pleural fluid or atelectasis at the posterior right  lung base. 4. Stable mild changes of COPD and chronic bronchitis. Electronically Signed   By: Claudie Revering M.D.   On: 10/22/2018 09:38   Nm Myocar Multi W/spect W/wall Motion / Ef  Result Date: 10/21/2018  The study is normal.  This is a low risk study.  The left ventricular ejection fraction is normal (55-65%).     EKG:   Orders placed or performed during the hospital encounter of 10/20/18  . ED EKG within 10 minutes  . ED EKG within 10 minutes  . EKG 12-Lead  . EKG 12-Lead      Management plans discussed with the patient, family and they are in agreement.  CODE STATUS:     Code Status Orders  (From admission, onward)         Start     Ordered   10/20/18 0801  Full code  Continuous     10/20/18 0801        Code Status History    This patient has a current code status but no historical code status.    Advance Directive Documentation     Most Recent Value  Type of Advance Directive  Healthcare Power of Attorney, Living will  Pre-existing out of facility DNR order (yellow form or pink MOST form)  -  "MOST" Form in Place?  -      TOTAL TIME TAKING CARE OF THIS PATIENT: 35 minutes.    Vaughan Basta M.D on 10/22/2018 at 12:47 PM  Between 7am to 6pm - Pager - [phone]  After 6pm go to [email or web address] - password [email or web address]  Office  [phone]  CC: Primary care physician; Jerrol Banana., MD   Note: This dictation was prepared with Dragon dictation along with smaller phrase technology. Any transcriptional errors that result from this process are unintentional.

## 2018-10-22 NOTE — Progress Notes (Signed)
Patient will leave in a private vehicle with her husband.  RN removed her IV.  Phillis Knack, RN

## 2018-10-22 NOTE — Progress Notes (Signed)
SUBJECTIVE: Patient had an episode of chest pain this morning   Vitals:   10/21/18 1535 10/21/18 1723 10/21/18 2020 10/22/18 0602  BP: 136/68 (!) 138/94 (!) 111/54 (!) 121/53  Pulse: (!) 130 (!) 128 72 73  Resp: 17   18  Temp: 99 F (37.2 C)  98.2 F (36.8 C) 98.6 F (37 C)  TempSrc: Oral  Oral   SpO2: 94% 95% 94% 96%  Weight:      Height:        Intake/Output Summary (Last 24 hours) at 10/22/2018 1141 Last data filed at 10/22/2018 1032 Gross per 24 hour  Intake 1200 ml  Output 800 ml  Net 400 ml    LABS: Basic Metabolic Panel: Recent Labs    10/20/18 0211  NA 139  K 3.8  CL 107  CO2 23  GLUCOSE 158*  BUN 18  CREATININE 0.89  CALCIUM 9.3   Liver Function Tests: Recent Labs    10/20/18 0211  AST 22  ALT 15  ALKPHOS 82  BILITOT 0.8  PROT 7.0  ALBUMIN 4.1   Recent Labs    10/20/18 0211  LIPASE 24   CBC: Recent Labs    10/20/18 0211  WBC 10.2  HGB 12.9  HCT 39.6  MCV 84.1  PLT 196   Cardiac Enzymes: Recent Labs    10/21/18 1327 10/21/18 1629 10/21/18 2010  TROPONINI <0.03 <0.03 <0.03   BNP: Invalid input(s): POCBNP D-Dimer: No results for input(s): DDIMER in the last 72 hours. Hemoglobin A1C: No results for input(s): HGBA1C in the last 72 hours. Fasting Lipid Panel: No results for input(s): CHOL, HDL, LDLCALC, TRIG, CHOLHDL, LDLDIRECT in the last 72 hours. Thyroid Function Tests: Recent Labs    10/21/18 1629  TSH 0.622   Anemia Panel: No results for input(s): VITAMINB12, FOLATE, FERRITIN, TIBC, IRON, RETICCTPCT in the last 72 hours.   PHYSICAL EXAM General: Well developed, well nourished, in no acute distress HEENT:  Normocephalic and atramatic Neck:  No JVD.  Lungs: Clear bilaterally to auscultation and percussion. Heart: HRRR . Normal S1 and S2 without gallops or murmurs.  Abdomen: Bowel sounds are positive, abdomen soft and non-tender  Msk:  Back normal, normal gait. Normal strength and tone for age. Extremities: No  clubbing, cyanosis or edema.   Neuro: Alert and oriented X 3. Psych:  Good affect, responds appropriately  TELEMETRY: Sinus rhythm  ASSESSMENT AND PLAN: Atypical chest pain most likely due to paroxysmal atrial fibrillation with rapid ventricular response rate.  Prior to admission patient probably had atrial fibrillation and took metoprolol and by the time she came to the emergency room she was in sinus rhythm.  During stress test patient was in atrial fibrillation with ventricular rate 146.  IV amiodarone was started and right now is in sinus rhythm.  She recalls that she was taking metoprolol periodically for paroxysmal atrial fibrillation which was first diagnosed about 5 to 6 years ago.  She was having problem with metoprolol added as it would lead to dizziness and hypotension.  Advise discontinuation of metoprolol and start p.o. amiodarone 400 p.o. twice daily for 3 days and then switch to 400 daily for 3 days and then 200 mg daily.  Advise starting the patient on Xarelto and discontinue Lovenox and aspirin.  Patient stress test was normal with no evidence of ischemia and normal left ventricular systolic function.  Reassured the patient and husband and they can be discharged at the end of the day with follow-up next  week with Dr.Kowalski.  I talked to the patient and she will make an appointment at [facility] cardiology tomorrow.  Active Problems:   A-fib [facility])   Chest pain    Tara Marquez A, MD, [facility] 10/22/2018 11:41 AM

## 2018-10-22 NOTE — Consult Note (Signed)
ANTICOAGULATION CONSULT NOTE - Initial Consult  Pharmacy Consult for Xarelto Dosing  Indication: atrial fibrillation  Allergies  Allergen Reactions  . Sulfa Antibiotics Hives  . Amoxicillin Rash  . Penicillins Rash   Patient Measurements: Height: 5\' 5"  (165.1 cm) Weight: 162 lb (73.5 kg) IBW/kg (Calculated) : 57  Vital Signs: Temp: 98.6 F (37 C) (10/27 0602) BP: 121/53 (10/27 0602) Pulse Rate: 73 (10/27 0602)  Labs: Recent Labs    10/20/18 0211  10/21/18 1327 10/21/18 1629 10/21/18 2010 10/22/18 1136  HGB 12.9  --   --   --   --  12.2  HCT 39.6  --   --   --   --  36.8  PLT 196  --   --   --   --  156  CREATININE 0.89  --   --   --   --   --   TROPONINI <0.03   < > <0.03 <0.03 <0.03  --    < > = values in this interval not displayed.    Estimated Creatinine Clearance: 54 mL/min (by C-G formula based on SCr of 0.89 mg/dL).   Medical History: Past Medical History:  Diagnosis Date  . A-fib ([place])   . Cancer ([place])    skin ca  . Depression    mild major depression  . GERD (gastroesophageal reflux disease)   . Hyperlipidemia    Assessment: Pharmacy consulted for Xarelto dosing in 77 yo female admitted with A. Fib.    Plan:  Start Xarelto 20mg  PO daily with supper (CrCl greater than 50 mL/min). Will give first dose now.  Pernell Dupre, PharmD, BCPS Clinical Pharmacist 10/22/2018 12:06 PM

## 2018-10-23 NOTE — Telephone Encounter (Signed)
McKenzie, I didn't see your note so I just wanted to make sure you knew about this one. Thanks

## 2018-10-23 NOTE — Telephone Encounter (Signed)
Transition Care Management Follow-up Telephone Call  Date of discharge and from where: [facility] on 10/22/18.  How have you been since you were released from the hospital? Still having some chest pain but it is not as bad as when she was in the hospital. Pt states the pain level is about a 3 or 4. Pt is still weak and has had some nausea but declines fever, SOB or v/d.  Any questions or concerns? No   Items Reviewed:  Did the pt receive and understand the discharge instructions provided? Yes   Medications obtained and verified? No, pt to review at [place] apt.  Any new allergies since your discharge? No   Dietary orders reviewed? Yes  Do you have support at home? Yes   Other (ie: DME, Home Health, etc) N/A  Functional Questionnaire: (I = Independent and D = Dependent)  Bathing/Dressing- I   Meal Prep- Has assistance with cooking.   Eating- I  Maintaining continence- I  Transferring/Ambulation- I  Managing Meds- I   Follow up appointments reviewed:    PCP Hospital f/u appt confirmed? Yes  Scheduled to see Dr Rosanna Randy on 10/26/18 @ 10:40 AM.  [facility] f/u appt confirmed? Yes    Are transportation arrangements needed? No   If their condition worsens, is the pt aware to call  their PCP or go to the ED? Yes  Was the patient provided with contact information for the PCP's office or ED? Yes  Was the pt encouraged to call back with questions or concerns? Yes

## 2018-10-26 ENCOUNTER — Ambulatory Visit (INDEPENDENT_AMBULATORY_CARE_PROVIDER_SITE_OTHER): Payer: Medicare Other | Admitting: Family Medicine

## 2018-10-26 VITALS — BP 142/70 | HR 65 | Temp 97.6°F | Wt 161.0 lb

## 2018-10-26 DIAGNOSIS — K2100 Gastro-esophageal reflux disease with esophagitis, without bleeding: Secondary | ICD-10-CM

## 2018-10-26 DIAGNOSIS — R079 Chest pain, unspecified: Secondary | ICD-10-CM | POA: Diagnosis not present

## 2018-10-26 DIAGNOSIS — R1013 Epigastric pain: Secondary | ICD-10-CM | POA: Diagnosis not present

## 2018-10-26 DIAGNOSIS — I4891 Unspecified atrial fibrillation: Secondary | ICD-10-CM

## 2018-10-26 MED ORDER — PANTOPRAZOLE SODIUM 40 MG PO TBEC: 40.0000 mg | DELAYED_RELEASE_TABLET | Freq: Every day | ORAL | 3 refills | Status: DC

## 2018-10-26 NOTE — Progress Notes (Signed)
Tara Marquez  MRN: [id number] DOB: [birth year]  Subjective:  HPI  The patient is a 77 year old female who presents for follow up of hospitalization.  She was admitted to [facility] on 10/20/18 with the diagnosis of non specific chest pain.  She was discharged on 10/22/18 with the diagnosis of Atrial Fibrillation and chest pain.   Her discharge instructions included that she discontinue Aspirin 325 mg.  While in the hospital she was started on Atorvastatin 40 mg daily and Xarelto 20 mg daily.  She was instructed to continue these after discharge.  She was also given NTS SL to use if needed. Patient was seen by Dr Nehemiah Massed, Cardiology and is to be followed by him tomorrow. The patient reports that she continues to have some chest pain since the hospitalization but it is much improved from when she went in.  She does complain of being very weak and wanted to know what her activity level should be.  The patient takes Metoprolol as needed and in the hospital notes it was mentioned that she did not tolerate this due to borderline BP and dizziness.  The patient is not currently taking it but would like clarification if she needs to be taking it.   Patient Active Problem List   Diagnosis Date Noted  . Chest pain 10/20/2018  . Giant cell arteritis ([place]) 08/27/2015  . A-fib ([place]) 08/27/2015  . Chest wall tenderness 08/27/2015  . Clinical depression 08/27/2015  . Blood pressure elevated 08/27/2015  . Polypharmacy 08/27/2015  . Esophagitis, reflux 08/27/2015  . Fatigue 08/27/2015  . Acid reflux 08/27/2015  . Cephalalgia 08/27/2015  . HLD (hyperlipidemia) 08/27/2015  . Adult hypothyroidism 08/27/2015  . Adaptive colitis 08/27/2015  . Malaise and fatigue 08/27/2015  . Mild major depression ([place]) 08/27/2015  . Polymyalgia rheumatica ([place]) 08/27/2015  . Cranial arteritis ([place]) 08/27/2015  . Avitaminosis D 08/27/2015    Past Medical History:  Diagnosis Date  . A-fib ([place])   . Cancer ([place])    skin  ca  . Depression    mild major depression  . GERD (gastroesophageal reflux disease)   . Hyperlipidemia     Social History   Socioeconomic History  . Marital status: Married    Spouse name: Not on file  . Number of children: 2  . Years of education: Not on file  . Highest education level: Some college, no degree  Occupational History  . Not on file  Social Needs  . Financial resource strain: Not hard at all  . Food insecurity:    Worry: Never true    Inability: Never true  . Transportation needs:    Medical: No    Non-medical: No  Tobacco Use  . Smoking status: Never Smoker  . Smokeless tobacco: Never Used  Substance and Sexual Activity  . Alcohol use: No  . Drug use: No  . Sexual activity: Yes  Lifestyle  . Physical activity:    Days per week: Not on file    Minutes per session: Not on file  . Stress: To some extent  Relationships  . Social connections:    Talks on phone: Not on file    Gets together: Not on file    Attends religious service: Not on file    Active member of club or organization: Not on file    Attends meetings of clubs or organizations: Not on file    Relationship status: Not on file  . Intimate partner violence:  Fear of current or ex partner: Not on file    Emotionally abused: Not on file    Physically abused: Not on file    Forced sexual activity: Not on file  Other Topics Concern  . Not on file  Social History Narrative  . Not on file    Outpatient Encounter Medications as of 10/26/2018  Medication Sig Note  . amiodarone (PACERONE) 200 MG tablet Take 1 tablet (200 mg total) by mouth 2 (two) times daily.   Marland Kitchen atorvastatin (LIPITOR) 40 MG tablet Take 1 tablet (40 mg total) by mouth daily at 6 PM.   . levothyroxine (SYNTHROID, LEVOTHROID) 75 MCG tablet Take 1 tablet (75 mcg total) by mouth daily.   . nitroGLYCERIN (NITROSTAT) 0.4 MG SL tablet Place 1 tablet (0.4 mg total) under the tongue every 5 (five) minutes as needed for chest pain.    . rivaroxaban (XARELTO) 20 MG TABS tablet Take 1 tablet (20 mg total) by mouth daily with supper.   . sertraline (ZOLOFT) 100 MG tablet TAKE 1 AND 1/2 TABLETS EVERY DAY (NEED TO MAKE APPOINTMENT) 10/20/2018: No recent fills. Per Pharmacy record pt needs an appt for refills.   Marland Kitchen amiodarone (PACERONE) 400 MG tablet Take 1 tablet (400 mg total) by mouth 2 (two) times daily for 3 days.   . metoprolol tartrate (LOPRESSOR) 25 MG tablet Take 1 tablet by mouth as needed.   . pantoprazole (PROTONIX) 40 MG tablet Take 1 tablet (40 mg total) by mouth daily.    No facility-administered encounter medications on file as of 10/26/2018.     Allergies  Allergen Reactions  . Sulfa Antibiotics Hives  . Amoxicillin Rash  . Penicillins Rash    Review of Systems  Constitutional: Positive for malaise/fatigue. Negative for fever.  Eyes: Negative.   Respiratory: Positive for shortness of breath. Negative for cough and wheezing.   Cardiovascular: Positive for chest pain. Negative for palpitations, orthopnea, claudication and leg swelling.  Gastrointestinal: Negative.   Skin: Negative.   Neurological: Positive for dizziness (when sitting to standing).  Endo/Heme/Allergies: Negative.   Psychiatric/Behavioral: Negative.     Objective:  BP (!) 142/70 (BP Location: Right Arm, Patient Position: Sitting, Cuff Size: Normal)   Pulse 65   Temp 97.6 F (36.4 C) (Oral)   Wt 161 lb (73 kg)   SpO2 94%   BMI 26.79 kg/m   Physical Exam  Constitutional: She is oriented to person, place, and time and well-developed, well-nourished, and in no distress.  HENT:  Head: Normocephalic and atraumatic.  Eyes: Conjunctivae are normal.  Neck: No thyromegaly present.  Cardiovascular: Normal rate, regular rhythm and normal heart sounds.  Pulmonary/Chest: Effort normal and breath sounds normal.  Abdominal: Soft.  Musculoskeletal: She exhibits no edema.  Neurological: She is alert and oriented to person, place, and time.  Gait normal. GCS score is 15.  Skin: Skin is warm and dry.  Psychiatric: Mood, memory, affect and judgment normal.    Assessment and Plan :     1. Epigastric pain  - pantoprazole (PROTONIX) 40 MG tablet; Take 1 tablet (40 mg total) by mouth daily.  Dispense: 30 tablet; Refill: 3  2. Chest pain, unspecified type Cardiology appt tomorrow.  3. Atrial fibrillation, unspecified type ([place]) On Xarelto.  4. Esophagitis, reflux 5.HLD  I have done the exam and reviewed the chart and it is accurate to the best of my knowledge. Development worker, community has been used and  any errors in dictation or transcription are  unintentional. Miguel Aschoff M.D. [facility]

## 2018-10-30 ENCOUNTER — Other Ambulatory Visit: Payer: Self-pay | Admitting: *Deleted

## 2018-10-30 NOTE — Patient Outreach (Signed)
[facility]) Care Management  10/30/2018  Tara Marquez 11/17/1941 [id number]   EMMI-general discharge ([facility]-non specific chest pain-atrial fibrillation and Chest pain ) RED ON EMMI ALERT Day # 4  Date: Friday 10/27/18 1610  Red Alert Reason: sad/hopeless/empty? yes   Outreach attempt # 1 Patient is able to verify HIPAA [facility] Care Management RN reviewed and addressed red alert with patient Tara Marquez states the answer to the EMMI question is incorrect She denies feeling sad/hopeless/empty. CM discussed [facility] SW services availability to assist [\prn She has reported some chest pain, dizziness but has followed up with her MD. She was encouraged to call the MD to report these s/s prn, [facility] Cm or 24 hr nurse line CM discussed checking her BP prior to taking medication, holding medicine and calling MD if VS abnormal She voiced appreciation and understanding    Social: Lives with husband. Independent with ADLs/iADLs Has transportation to medical appointments   Conditions: giant cell arteritis, a fib, reflux esophagitis, hypothyroidism depression, HLD, chest pain    Medications:denies concerns with taking medications as prescribed, affording medications, side effects of medications and questions about medications   Appointments: Saw Dr Rosanna Randy, primary MD  f/u with primary 11/15/18 1520 3 week f/u    Advance Directives: She has a POA ans living will   Consent: [facility] RN CM reviewed [facility] services with patient. Patient gave verbal consent for services. Advised patient that there will be further automated EMMI- post discharge calls to assess how the patient is doing following the recent hospitalization Advised the patient that another call may be received from a nurse if any of their responses were abnormal. Patient voiced understanding and was appreciative of f/u call.   Plan: [facility] RN CM will close case at this time as patient has been assessed and no needs identified.   [facility]  RN CM sent a successful outreach letter as discussed with [facility] brochure enclosed for review  Pt encouraged to return a call to [facility] RN CM prn   L. Lavina Hamman, RN, BSN, [place] Coordinator Office number [phone] Mobile number [phone]  Main [facility] number [phone] Fax number [phone]

## 2018-10-31 DIAGNOSIS — I480 Paroxysmal atrial fibrillation: Secondary | ICD-10-CM | POA: Diagnosis not present

## 2018-10-31 DIAGNOSIS — I10 Essential (primary) hypertension: Secondary | ICD-10-CM | POA: Diagnosis not present

## 2018-10-31 DIAGNOSIS — E782 Mixed hyperlipidemia: Secondary | ICD-10-CM | POA: Diagnosis not present

## 2018-11-02 ENCOUNTER — Telehealth: Payer: Self-pay

## 2018-11-02 NOTE — Telephone Encounter (Signed)
Patient requesting advise. Patient reports that pantoprazole 40 mg has not helped with stomach pain. Patient wants to know if medication can be changed.

## 2018-11-02 NOTE — Telephone Encounter (Signed)
Can we change or add to this?

## 2018-11-09 NOTE — Telephone Encounter (Signed)
Add carafate 1 gram before meals and at bedtime.

## 2018-11-13 ENCOUNTER — Other Ambulatory Visit: Payer: Self-pay

## 2018-11-13 MED ORDER — SUCRALFATE 1 G PO TABS: 1.0000 g | ORAL_TABLET | Freq: Three times a day (TID) | ORAL | 12 refills | Status: DC

## 2018-11-13 NOTE — Telephone Encounter (Signed)
Pt advised and Rx sent to pharmacy

## 2018-11-15 ENCOUNTER — Ambulatory Visit (INDEPENDENT_AMBULATORY_CARE_PROVIDER_SITE_OTHER): Payer: Medicare Other | Admitting: Family Medicine

## 2018-11-15 ENCOUNTER — Encounter: Payer: Self-pay | Admitting: Family Medicine

## 2018-11-15 VITALS — BP 120/64 | HR 64 | Temp 98.2°F | Resp 16 | Wt 163.0 lb

## 2018-11-15 DIAGNOSIS — R1084 Generalized abdominal pain: Secondary | ICD-10-CM

## 2018-11-15 DIAGNOSIS — R1013 Epigastric pain: Secondary | ICD-10-CM

## 2018-11-15 NOTE — Progress Notes (Signed)
Patient: Tara Marquez Female    DOB: Nov 15, 1941   77 y.o.   MRN: [id number] Visit Date: 11/15/2018  Today's Provider: [name], MD   Chief Complaint  Patient presents with  . Follow-up  . Abdominal Pain   Subjective:    HPI Patient comes in today for a follow up. She was last seen in the office 3 weeks ago c/o stomach pain. She was advised to start pantoprazole 40mg  daily. On 11/02/18, patient called the office saying that the pantoprazole has not helped with her pain. We added Carafate 1GM before meals and at bedtime. Patient reports that she has not started the medication yet.  The pain is not associated with food.  She has no associated symptoms.  It does not radiate.  He has had no weight loss.S  Allergies  Allergen Reactions  . Sulfa Antibiotics Hives  . Amoxicillin Rash  . Penicillins Rash     Current Outpatient Medications:  .  amiodarone (PACERONE) 200 MG tablet, Take 1 tablet (200 mg total) by mouth 2 (two) times daily., Disp: 50 tablet, Rfl: 0 .  amiodarone (PACERONE) 400 MG tablet, Take 1 tablet (400 mg total) by mouth 2 (two) times daily for 3 days., Disp: 6 tablet, Rfl: 0 .  atorvastatin (LIPITOR) 40 MG tablet, Take 1 tablet (40 mg total) by mouth daily at 6 PM., Disp: 30 tablet, Rfl: 0 .  levothyroxine (SYNTHROID, LEVOTHROID) 75 MCG tablet, Take 1 tablet (75 mcg total) by mouth daily., Disp: 30 tablet, Rfl: 5 .  metoprolol tartrate (LOPRESSOR) 25 MG tablet, Take 1 tablet by mouth as needed., Disp: , Rfl:  .  nitroGLYCERIN (NITROSTAT) 0.4 MG SL tablet, Place 1 tablet (0.4 mg total) under the tongue every 5 (five) minutes as needed for chest pain., Disp: 30 tablet, Rfl: 2 .  pantoprazole (PROTONIX) 40 MG tablet, Take 1 tablet (40 mg total) by mouth daily., Disp: 30 tablet, Rfl: 3 .  rivaroxaban (XARELTO) 20 MG TABS tablet, Take 1 tablet (20 mg total) by mouth daily with supper., Disp: 30 tablet, Rfl: 0 .  sertraline (ZOLOFT) 100 MG tablet, TAKE 1 AND  1/2 TABLETS EVERY DAY (NEED TO MAKE APPOINTMENT), Disp: 135 tablet, Rfl: 3 .  sucralfate (CARAFATE) 1 g tablet, Take 1 tablet (1 g total) by mouth 4 (four) times daily -  with meals and at bedtime., Disp: 120 tablet, Rfl: 12  Review of Systems  Constitutional: Negative for activity change, appetite change, chills, diaphoresis, fatigue, fever and unexpected weight change.  HENT: Negative.   Eyes: Negative.   Respiratory: Negative.   Cardiovascular: Negative.   Gastrointestinal: Positive for abdominal pain. Negative for abdominal distention, blood in stool, constipation, diarrhea, nausea, rectal pain and vomiting.  Endocrine: Negative.   Genitourinary: Negative for difficulty urinating, flank pain, frequency and hematuria.  Allergic/Immunologic: Negative.   Neurological: Negative for dizziness, light-headedness and headaches.  Psychiatric/Behavioral: Negative.     Social History   Tobacco Use  . Smoking status: Never Smoker  . Smokeless tobacco: Never Used  Substance Use Topics  . Alcohol use: No   Objective:   BP 120/64 (BP Location: Left Arm, Patient Position: Sitting, Cuff Size: Normal)   Pulse 64   Temp 98.2 F (36.8 C)   Resp 16   Wt 163 lb (73.9 kg)   BMI 27.12 kg/m  Vitals:   11/15/18 1543  BP: 120/64  Pulse: 64  Resp: 16  Temp: 98.2 F (36.8 C)  Weight: 163 lb (73.9 kg)     Physical Exam  Constitutional: She is oriented to person, place, and time. She appears well-developed and well-nourished.  HENT:  Head: Normocephalic and atraumatic.  Eyes: Pupils are equal, round, and reactive to light. No scleral icterus.  Cardiovascular: Normal rate, regular rhythm and normal heart sounds.  Pulmonary/Chest: Effort normal and breath sounds normal.  Abdominal: Soft. There is tenderness in the epigastric area.  Mild  epigastric tenderness  Neurological: She is alert and oriented to person, place, and time.  Skin: Skin is warm and dry.  Psychiatric: She has a normal  mood and affect. Her behavior is normal.        Assessment & Plan:     1. Epigastric pain Check lab work for H. pylori.  Get ultrasound.  Any GI referral is imaging I do not think is indicated at this time.  I have her start the Carafate that is been prescribed. RTC  3 weeks. - CBC with Differential/Platelet - Comprehensive metabolic panel - Lipase - H Pylori, IGM, IGG, IGA AB  2. Generalized abdominal pain  - US Abdomen Complete; Future      I have done the exam and reviewed the above chart and it is accurate to the best of my knowledge. Development worker, community has been used in this note in any air is in the dictation or transcription are unintentional.  [name], MD  [place]

## 2018-11-17 DIAGNOSIS — E782 Mixed hyperlipidemia: Secondary | ICD-10-CM | POA: Diagnosis not present

## 2018-11-17 LAB — LIPASE: Lipase: 12 U/L — ABNORMAL LOW (ref 14–85)

## 2018-11-17 LAB — COMPREHENSIVE METABOLIC PANEL
ALT: 18 IU/L (ref 0–32)
AST: 19 IU/L (ref 0–40)
Albumin/Globulin Ratio: 1.6 (ref 1.2–2.2)
Albumin: 3.9 g/dL (ref 3.5–4.8)
Alkaline Phosphatase: 114 IU/L (ref 39–117)
BUN/Creatinine Ratio: 12 (ref 12–28)
BUN: 15 mg/dL (ref 8–27)
Bilirubin Total: 0.5 mg/dL (ref 0.0–1.2)
CO2: 23 mmol/L (ref 20–29)
Calcium: 9.2 mg/dL (ref 8.7–10.3)
Chloride: 103 mmol/L (ref 96–106)
Creatinine, Ser: 1.21 mg/dL — ABNORMAL HIGH (ref 0.57–1.00)
GFR calc Af Amer: 50 mL/min/{1.73_m2} — ABNORMAL LOW (ref >59)
GFR calc non Af Amer: 44 mL/min/{1.73_m2} — ABNORMAL LOW (ref >59)
Globulin, Total: 2.5 g/dL (ref 1.5–4.5)
Glucose: 110 mg/dL — ABNORMAL HIGH (ref 65–99)
Potassium: 4 mmol/L (ref 3.5–5.2)
Sodium: 143 mmol/L (ref 134–144)
Total Protein: 6.4 g/dL (ref 6.0–8.5)

## 2018-11-17 LAB — CBC WITH DIFFERENTIAL/PLATELET
Basophils Absolute: 0.1 10*3/uL (ref 0.0–0.2)
Basos: 1 %
EOS (ABSOLUTE): 0.1 10*3/uL (ref 0.0–0.4)
Eos: 1 %
Hematocrit: 37.4 % (ref 34.0–46.6)
Hemoglobin: 12.3 g/dL (ref 11.1–15.9)
Immature Grans (Abs): 0 10*3/uL (ref 0.0–0.1)
Immature Granulocytes: 0 %
Lymphocytes Absolute: 2 10*3/uL (ref 0.7–3.1)
Lymphs: 21 %
MCH: 26.9 pg (ref 26.6–33.0)
MCHC: 32.9 g/dL (ref 31.5–35.7)
MCV: 82 fL (ref 79–97)
Monocytes Absolute: 0.6 10*3/uL (ref 0.1–0.9)
Monocytes: 6 %
Neutrophils Absolute: 6.6 10*3/uL (ref 1.4–7.0)
Neutrophils: 71 %
Platelets: 270 10*3/uL (ref 150–450)
RBC: 4.58 x10E6/uL (ref 3.77–5.28)
RDW: 13.6 % (ref 12.3–15.4)
WBC: 9.5 10*3/uL (ref 3.4–10.8)

## 2018-11-17 LAB — H PYLORI, IGM, IGG, IGA AB
H pylori, IgM Abs: <9 units (ref 0.0–8.9)
H. pylori, IgA Abs: <9 units (ref 0.0–8.9)
H. pylori, IgG AbS: 0.28 Index Value (ref 0.00–0.79)

## 2018-11-20 ENCOUNTER — Telehealth: Payer: Self-pay | Admitting: *Deleted

## 2018-11-20 NOTE — Telephone Encounter (Signed)
No answer and no vm

## 2018-11-20 NOTE — Telephone Encounter (Signed)
-----   Message from Jerrol Banana., MD sent at 11/20/2018  2:18 PM EST ----- Labs okay except for some mild decrease in kidney function.  Increase fluids and will check renal panel on next visit

## 2018-11-21 ENCOUNTER — Ambulatory Visit
Admission: RE | Admit: 2018-11-21 | Discharge: 2018-11-21 | Disposition: A | Discharge status: Home / Self Care | Payer: Medicare Other | Source: Ambulatory Visit | Attending: Family Medicine | Admitting: Family Medicine

## 2018-11-21 DIAGNOSIS — K760 Fatty (change of) liver, not elsewhere classified: Secondary | ICD-10-CM | POA: Insufficient documentation

## 2018-11-21 DIAGNOSIS — R1084 Generalized abdominal pain: Secondary | ICD-10-CM | POA: Diagnosis present

## 2018-11-21 DIAGNOSIS — R143 Flatulence: Secondary | ICD-10-CM | POA: Diagnosis not present

## 2018-11-22 NOTE — Telephone Encounter (Signed)
Patient was notified of results. Expressed understanding.  

## 2018-12-06 ENCOUNTER — Ambulatory Visit (INDEPENDENT_AMBULATORY_CARE_PROVIDER_SITE_OTHER): Payer: Medicare Other | Admitting: Family Medicine

## 2018-12-06 ENCOUNTER — Encounter: Payer: Self-pay | Admitting: Family Medicine

## 2018-12-06 VITALS — BP 138/76 | HR 72 | Temp 97.7°F | Resp 16 | Wt 162.0 lb

## 2018-12-06 DIAGNOSIS — K2100 Gastro-esophageal reflux disease with esophagitis, without bleeding: Secondary | ICD-10-CM

## 2018-12-06 DIAGNOSIS — N289 Disorder of kidney and ureter, unspecified: Secondary | ICD-10-CM | POA: Diagnosis not present

## 2018-12-06 DIAGNOSIS — K589 Irritable bowel syndrome without diarrhea: Secondary | ICD-10-CM

## 2018-12-06 DIAGNOSIS — I4891 Unspecified atrial fibrillation: Secondary | ICD-10-CM

## 2018-12-06 DIAGNOSIS — R1013 Epigastric pain: Secondary | ICD-10-CM | POA: Diagnosis not present

## 2018-12-06 NOTE — Progress Notes (Signed)
Patient: Tara Marquez Female    DOB: 1941/11/03   77 y.o.   MRN: [id number] Visit Date: 12/06/2018  Today's Provider: [name], MD   Chief Complaint  Patient presents with  . Abdominal Pain   Subjective:    HPI Follow Up  Patient returns to clinic today to follow up for Epigastric pain. Patient was last seen in office on 11/15/18 and was advised to continue Carafate, abdominal ultrasound was ordered. Lab for H.Pylori had came back negative and all other screening labs were stable, abdominal ultrasound that was performed was also negative. Patient reports that abdominal pain has improved since starting Carafate but states that she has had adverse reaction to medication causing her to vomit.  On further questioning there is no adverse reaction.  Seems the pill either got caught or she got gagged. Overall she says she is feeling better.  Atrial fibrillation is stable.  Denies any anorexia or significant weight loss. A. fib is covered by cardiology.  Is fairly stable. She does have chronic anxiety which is also stable.  Allergies  Allergen Reactions  . Sulfa Antibiotics Hives  . Amoxicillin Rash  . Penicillins Rash     Current Outpatient Medications:  .  amiodarone (PACERONE) 200 MG tablet, Take 1 tablet (200 mg total) by mouth 2 (two) times daily., Disp: 50 tablet, Rfl: 0 .  atorvastatin (LIPITOR) 40 MG tablet, Take 1 tablet (40 mg total) by mouth daily at 6 PM., Disp: 30 tablet, Rfl: 0 .  levothyroxine (SYNTHROID, LEVOTHROID) 75 MCG tablet, Take 1 tablet (75 mcg total) by mouth daily., Disp: 30 tablet, Rfl: 5 .  metoprolol tartrate (LOPRESSOR) 25 MG tablet, Take 1 tablet by mouth as needed., Disp: , Rfl:  .  nitroGLYCERIN (NITROSTAT) 0.4 MG SL tablet, Place 1 tablet (0.4 mg total) under the tongue every 5 (five) minutes as needed for chest pain., Disp: 30 tablet, Rfl: 2 .  pantoprazole (PROTONIX) 40 MG tablet, Take 1 tablet (40 mg total) by mouth daily., Disp: 30  tablet, Rfl: 3 .  rivaroxaban (XARELTO) 20 MG TABS tablet, Take 1 tablet (20 mg total) by mouth daily with supper., Disp: 30 tablet, Rfl: 0 .  sertraline (ZOLOFT) 100 MG tablet, TAKE 1 AND 1/2 TABLETS EVERY DAY (NEED TO MAKE APPOINTMENT), Disp: 135 tablet, Rfl: 3 .  sucralfate (CARAFATE) 1 g tablet, Take 1 tablet (1 g total) by mouth 4 (four) times daily -  with meals and at bedtime., Disp: 120 tablet, Rfl: 12  Review of Systems  Constitutional: Negative.   HENT: Negative.   Eyes: Negative.   Respiratory: Negative.   Cardiovascular: Negative.   Gastrointestinal: Positive for abdominal pain, nausea and vomiting. Negative for abdominal distention, anal bleeding, blood in stool, constipation, diarrhea and rectal pain.  Endocrine: Negative.   Genitourinary: Negative.   Musculoskeletal: Negative.   Skin: Negative.   Allergic/Immunologic: Negative.   Neurological: Negative.   Hematological: Negative.   Psychiatric/Behavioral: Negative.     Social History   Tobacco Use  . Smoking status: Never Smoker  . Smokeless tobacco: Never Used  Substance Use Topics  . Alcohol use: No   Objective:   BP 138/76   Pulse 72   Temp 97.7 F (36.5 C) (Oral)   Resp 16   Wt 162 lb (73.5 kg)   BMI 26.96 kg/m  Vitals:   12/06/18 1113  BP: 138/76  Pulse: 72  Resp: 16  Temp: 97.7 F (36.5 C)  TempSrc: Oral  Weight: 162 lb (73.5 kg)     Physical Exam Constitutional:      Appearance: She is well-developed.  HENT:     Head: Normocephalic and atraumatic.  Eyes:     General: No scleral icterus.    Pupils: Pupils are equal, round, and reactive to light.  Cardiovascular:     Rate and Rhythm: Normal rate and regular rhythm.     Heart sounds: Normal heart sounds.  Pulmonary:     Effort: Pulmonary effort is normal.     Breath sounds: Normal breath sounds.  Abdominal:     Palpations: Abdomen is soft.     Tenderness: There is abdominal tenderness in the epigastric area.     Comments: Mild   epigastric tenderness  Lymphadenopathy:     Cervical: No cervical adenopathy.  Skin:    General: Skin is warm and dry.  Neurological:     Mental Status: She is alert and oriented to person, place, and time.  Psychiatric:        Mood and Affect: Mood normal.        Behavior: Behavior normal.        Thought Content: Thought content normal.        Judgment: Judgment normal.         Assessment & Plan:     1. Epigastric pain Patient states it is improved some and she would like to continue the Carafate.  She has not lost any weight.  Continue this and follow-up in 1 to 2 months.  Referral if it worsens or if she loses weight.  2. Abnormal kidney function Try to avoid nephrotoxic drugs.  Hydrate. - Renal Function Panel  3. Atrial fibrillation, unspecified type [facility]) Cardiology.  4. Esophagitis, reflux Given, may need GI referral.  5. Adaptive colitis 6.HLD      Patient seen and examined by Dr. Miguel Aschoff, note scribed by Jennings Books, [place], MD  [facility]

## 2018-12-07 LAB — RENAL FUNCTION PANEL
Albumin: 4.2 g/dL (ref 3.5–4.8)
BUN/Creatinine Ratio: 16 (ref 12–28)
BUN: 16 mg/dL (ref 8–27)
CO2: 23 mmol/L (ref 20–29)
Calcium: 9.1 mg/dL (ref 8.7–10.3)
Chloride: 103 mmol/L (ref 96–106)
Creatinine, Ser: 1 mg/dL (ref 0.57–1.00)
GFR calc Af Amer: 63 mL/min/{1.73_m2} (ref >59)
GFR calc non Af Amer: 55 mL/min/{1.73_m2} — ABNORMAL LOW (ref >59)
Glucose: 92 mg/dL (ref 65–99)
Phosphorus: 3.1 mg/dL (ref 2.5–4.5)
Potassium: 4 mmol/L (ref 3.5–5.2)
Sodium: 139 mmol/L (ref 134–144)

## 2018-12-08 ENCOUNTER — Telehealth: Payer: Self-pay

## 2018-12-08 NOTE — Telephone Encounter (Signed)
-----   Message from Jerrol Banana., MD sent at 12/08/2018  8:53 AM EST ----- Labs improving

## 2018-12-08 NOTE — Telephone Encounter (Signed)
Pt advised.   Thanks,   -Arneisha Kincannon  

## 2019-01-01 DIAGNOSIS — I10 Essential (primary) hypertension: Secondary | ICD-10-CM | POA: Diagnosis not present

## 2019-01-01 DIAGNOSIS — E782 Mixed hyperlipidemia: Secondary | ICD-10-CM | POA: Diagnosis not present

## 2019-01-01 DIAGNOSIS — I480 Paroxysmal atrial fibrillation: Secondary | ICD-10-CM | POA: Diagnosis not present

## 2019-01-11 ENCOUNTER — Encounter: Payer: Self-pay | Admitting: Physician Assistant

## 2019-01-11 ENCOUNTER — Ambulatory Visit (INDEPENDENT_AMBULATORY_CARE_PROVIDER_SITE_OTHER): Payer: Medicare Other | Admitting: Physician Assistant

## 2019-01-11 VITALS — BP 147/77 | HR 70 | Temp 97.7°F | Resp 16 | Wt 161.0 lb

## 2019-01-11 DIAGNOSIS — R399 Unspecified symptoms and signs involving the genitourinary system: Secondary | ICD-10-CM | POA: Diagnosis not present

## 2019-01-11 DIAGNOSIS — F320 Major depressive disorder, single episode, mild: Secondary | ICD-10-CM

## 2019-01-11 LAB — POCT URINALYSIS DIPSTICK
Bilirubin, UA: NEGATIVE
Glucose, UA: NEGATIVE
Ketones, UA: NEGATIVE
Nitrite, UA: NEGATIVE
Protein, UA: NEGATIVE
Spec Grav, UA: 1.015 (ref 1.010–1.025)
Urobilinogen, UA: 0.2 E.U./dL
pH, UA: 6 (ref 5.0–8.0)

## 2019-01-11 MED ORDER — DOXYCYCLINE HYCLATE 100 MG PO TABS: 100.0000 mg | ORAL_TABLET | Freq: Two times a day (BID) | ORAL | 0 refills | Status: AC

## 2019-01-11 MED ORDER — SERTRALINE HCL 100 MG PO TABS: ORAL_TABLET | ORAL | 0 refills | Status: DC

## 2019-01-11 NOTE — Patient Instructions (Signed)

## 2019-01-11 NOTE — Progress Notes (Signed)
Patient: Tara Marquez Female    DOB: Nov 16, 1941   78 y.o.   MRN: [id number] Visit Date: 01/16/2019  Today's Provider: Trinna Post, PA-C   Chief Complaint  Patient presents with  . Urinary Tract Infection   Subjective:     HPI Urinary Tract Infection: Patient complains of burning with urination and suprapubic pressure She has had symptoms for 4 days. Patient also complains of stomach ache. Patient denies back pain and fever. Patient does not have a history of recurrent UTI.  Patient does not have a history of pyelonephritis.   Allergies  Allergen Reactions  . Sulfa Antibiotics Hives  . Amoxicillin Rash  . Penicillins Rash     Current Outpatient Medications:  .  amiodarone (PACERONE) 200 MG tablet, Take 1 tablet (200 mg total) by mouth 2 (two) times daily., Disp: 50 tablet, Rfl: 0 .  atorvastatin (LIPITOR) 40 MG tablet, Take 1 tablet (40 mg total) by mouth daily at 6 PM., Disp: 30 tablet, Rfl: 0 .  levothyroxine (SYNTHROID, LEVOTHROID) 75 MCG tablet, Take 1 tablet (75 mcg total) by mouth daily., Disp: 30 tablet, Rfl: 5 .  metoprolol tartrate (LOPRESSOR) 25 MG tablet, Take 1 tablet by mouth as needed., Disp: , Rfl:  .  nitroGLYCERIN (NITROSTAT) 0.4 MG SL tablet, Place 1 tablet (0.4 mg total) under the tongue every 5 (five) minutes as needed for chest pain., Disp: 30 tablet, Rfl: 2 .  pantoprazole (PROTONIX) 40 MG tablet, Take 1 tablet (40 mg total) by mouth daily., Disp: 30 tablet, Rfl: 3 .  rivaroxaban (XARELTO) 20 MG TABS tablet, Take 1 tablet (20 mg total) by mouth daily with supper., Disp: 30 tablet, Rfl: 0 .  sertraline (ZOLOFT) 100 MG tablet, TAKE 1 AND 1/2 TABLETS EVERY DAY (NEED TO MAKE APPOINTMENT), Disp: 135 tablet, Rfl: 0 .  sucralfate (CARAFATE) 1 g tablet, Take 1 tablet (1 g total) by mouth 4 (four) times daily -  with meals and at bedtime., Disp: 120 tablet, Rfl: 12 .  doxycycline (VIBRA-TABS) 100 MG tablet, Take 1 tablet (100 mg total) by mouth 2 (two) times  daily for 7 days., Disp: 14 tablet, Rfl: 0  Review of Systems  Constitutional: Negative.   Genitourinary: Positive for dysuria and pelvic pain.    Social History   Tobacco Use  . Smoking status: Never Smoker  . Smokeless tobacco: Never Used  Substance Use Topics  . Alcohol use: No      Objective:   BP (!) 147/77 (BP Location: Left Arm, Patient Position: Sitting, Cuff Size: Normal)   Pulse 70   Temp 97.7 F (36.5 C) (Oral)   Resp 16   Wt 161 lb (73 kg)   BMI 26.79 kg/m  Vitals:   01/11/19 1200  BP: (!) 147/77  Pulse: 70  Resp: 16  Temp: 97.7 F (36.5 C)  TempSrc: Oral  Weight: 161 lb (73 kg)     Physical Exam Constitutional:      General: She is not in acute distress.    Appearance: She is well-developed. She is not diaphoretic.  Cardiovascular:     Rate and Rhythm: Normal rate and regular rhythm.  Pulmonary:     Effort: Pulmonary effort is normal.     Breath sounds: Normal breath sounds.  Abdominal:     General: Bowel sounds are normal. There is no distension.     Palpations: Abdomen is soft.     Tenderness: There is abdominal tenderness in  the suprapubic area. There is no guarding or rebound.  Skin:    General: Skin is warm and dry.  Neurological:     Mental Status: She is alert and oriented to person, place, and time.  Psychiatric:        Behavior: Behavior normal.         Assessment & Plan    1. Urinary tract infection symptoms  Treat empirically and adjust pending culture.   - POCT urinalysis dipstick - doxycycline (VIBRA-TABS) 100 MG tablet; Take 1 tablet (100 mg total) by mouth 2 (two) times daily for 7 days.  Dispense: 14 tablet; Refill: 0 - Urine Culture  2. Mild major depression ([place])  Filled per patient request as she is running out. Emphasized importance of routine follow up with Dr. Rosanna Randy for this issue.   - sertraline (ZOLOFT) 100 MG tablet; TAKE 1 AND 1/2 TABLETS EVERY DAY (NEED TO MAKE APPOINTMENT)  Dispense: 135 tablet;  Refill: 0  Return if symptoms worsen or fail to improve.  The entirety of the information documented in the History of Present Illness, Review of Systems and Physical Exam were personally obtained by me. Portions of this information were initially documented by Lynford Humphrey, CMA and reviewed by me for thoroughness and accuracy.       Trinna Post, PA-C  [facility]

## 2019-01-13 LAB — URINE CULTURE

## 2019-01-15 ENCOUNTER — Telehealth: Payer: Self-pay

## 2019-01-15 NOTE — Telephone Encounter (Signed)
Patient advised as below.  

## 2019-01-15 NOTE — Telephone Encounter (Signed)
-----   Message from Trinna Post, [place] sent at 01/15/2019  3:52 PM EST ----- Urine culture showed e coli sensitive to doxycycline. Please continue medication.

## 2019-01-29 ENCOUNTER — Telehealth: Payer: Self-pay | Admitting: Family Medicine

## 2019-01-29 DIAGNOSIS — R1013 Epigastric pain: Secondary | ICD-10-CM

## 2019-01-29 MED ORDER — PANTOPRAZOLE SODIUM 40 MG PO TBEC: 40.0000 mg | DELAYED_RELEASE_TABLET | Freq: Every day | ORAL | 3 refills | Status: DC

## 2019-01-29 NOTE — Telephone Encounter (Signed)
Pt needs refill on Pantoprazole 40 mg  [place]  Thank s [employer]

## 2019-02-15 ENCOUNTER — Telehealth: Payer: Self-pay | Admitting: Family Medicine

## 2019-02-15 NOTE — Telephone Encounter (Signed)
I left a message asking the pt to call and schedule her AWV-S with McKenzie. If the patient calls back, please schedule her AWV-S on or after 02/22/2019 (last AWV 02/21/18). VDM (DD)

## 2019-03-07 DIAGNOSIS — I10 Essential (primary) hypertension: Secondary | ICD-10-CM | POA: Diagnosis not present

## 2019-03-07 DIAGNOSIS — I480 Paroxysmal atrial fibrillation: Secondary | ICD-10-CM | POA: Diagnosis not present

## 2019-03-07 DIAGNOSIS — E782 Mixed hyperlipidemia: Secondary | ICD-10-CM | POA: Diagnosis not present

## 2019-03-08 ENCOUNTER — Other Ambulatory Visit: Payer: Self-pay

## 2019-03-08 ENCOUNTER — Ambulatory Visit (INDEPENDENT_AMBULATORY_CARE_PROVIDER_SITE_OTHER): Payer: Medicare Other | Admitting: Family Medicine

## 2019-03-08 ENCOUNTER — Encounter: Payer: Self-pay | Admitting: Family Medicine

## 2019-03-08 VITALS — BP 148/76 | HR 63 | Temp 97.3°F | Ht 65.0 in | Wt 155.8 lb

## 2019-03-08 DIAGNOSIS — R350 Frequency of micturition: Secondary | ICD-10-CM | POA: Diagnosis not present

## 2019-03-08 DIAGNOSIS — K2100 Gastro-esophageal reflux disease with esophagitis, without bleeding: Secondary | ICD-10-CM

## 2019-03-08 DIAGNOSIS — R319 Hematuria, unspecified: Secondary | ICD-10-CM

## 2019-03-08 DIAGNOSIS — F320 Major depressive disorder, single episode, mild: Secondary | ICD-10-CM | POA: Diagnosis not present

## 2019-03-08 DIAGNOSIS — I4891 Unspecified atrial fibrillation: Secondary | ICD-10-CM

## 2019-03-08 DIAGNOSIS — I10 Essential (primary) hypertension: Secondary | ICD-10-CM | POA: Diagnosis not present

## 2019-03-08 DIAGNOSIS — N390 Urinary tract infection, site not specified: Secondary | ICD-10-CM

## 2019-03-08 LAB — POCT URINALYSIS DIPSTICK
Glucose, UA: NEGATIVE
Nitrite, UA: NEGATIVE
Protein, UA: POSITIVE — AB
Spec Grav, UA: 1.015 (ref 1.010–1.025)
Urobilinogen, UA: 0.2 E.U./dL
pH, UA: 5 (ref 5.0–8.0)

## 2019-03-08 NOTE — Progress Notes (Signed)
Patient: Tara Marquez Female    DOB: 1941/12/24   78 y.o.   MRN: [id number] Visit Date: 03/08/2019  Today's Provider: [name], MD   Chief Complaint  Patient presents with  . Abdominal Pain    2 month fup  . urine frequency    reports she feels pressure and abdominal pain and frequency   Subjective:     HPI   Follow up for 2 month fup Afib and Epigastric pain  The patient was last seen for this 2 months ago. Changes made at last visit include continue carafate  She reports good compliance with treatment. She feels that condition is Improved. She is not having side effects.   Pt also reports urine frequency and pressure in abdomen.  She thinks she has a UTI.  It is of note that she thinks the nausea got a little bit better when she cut her amiodarone dose in half per cardiology request.  ------------------------------------------------------------------------------------   Allergies  Allergen Reactions  . Sulfa Antibiotics Hives  . Amoxicillin Rash  . Penicillins Rash     Current Outpatient Medications:  .  amiodarone (PACERONE) 200 MG tablet, Take 1 tablet (200 mg total) by mouth 2 (two) times daily., Disp: 50 tablet, Rfl: 0 .  atorvastatin (LIPITOR) 40 MG tablet, Take 1 tablet (40 mg total) by mouth daily at 6 PM., Disp: 30 tablet, Rfl: 0 .  levothyroxine (SYNTHROID, LEVOTHROID) 75 MCG tablet, Take 1 tablet (75 mcg total) by mouth daily., Disp: 30 tablet, Rfl: 5 .  metoprolol tartrate (LOPRESSOR) 25 MG tablet, Take 1 tablet by mouth as needed., Disp: , Rfl:  .  nitroGLYCERIN (NITROSTAT) 0.4 MG SL tablet, Place 1 tablet (0.4 mg total) under the tongue every 5 (five) minutes as needed for chest pain., Disp: 30 tablet, Rfl: 2 .  pantoprazole (PROTONIX) 40 MG tablet, Take 1 tablet (40 mg total) by mouth daily., Disp: 30 tablet, Rfl: 3 .  rivaroxaban (XARELTO) 20 MG TABS tablet, Take 1 tablet (20 mg total) by mouth daily with supper., Disp: 30 tablet,  Rfl: 0 .  sertraline (ZOLOFT) 100 MG tablet, TAKE 1 AND 1/2 TABLETS EVERY DAY (NEED TO MAKE APPOINTMENT), Disp: 135 tablet, Rfl: 0 .  sucralfate (CARAFATE) 1 g tablet, Take 1 tablet (1 g total) by mouth 4 (four) times daily -  with meals and at bedtime., Disp: 120 tablet, Rfl: 12  Review of Systems  Constitutional: Positive for appetite change and unexpected weight change.       Mild weight loss which seems to have stabilized per patient  HENT: Negative.   Eyes: Negative.   Respiratory: Negative.   Cardiovascular: Negative.   Gastrointestinal: Positive for nausea.  Endocrine: Negative.   Genitourinary: Positive for dysuria and urgency.  Musculoskeletal: Negative.   Allergic/Immunologic: Negative.   Neurological: Negative.   Hematological: Negative.   Psychiatric/Behavioral: Negative.     Social History   Tobacco Use  . Smoking status: Never Smoker  . Smokeless tobacco: Never Used  Substance Use Topics  . Alcohol use: No      Objective:   BP (!) 148/76 (BP Location: Right Arm, Patient Position: Sitting, Cuff Size: Normal)   Pulse 63   Temp (!) 97.3 F (36.3 C) (Oral)   Ht 5\' 5"  (1.651 m)   Wt 155 lb 12.8 oz (70.7 kg)   SpO2 96%   BMI 25.93 kg/m  Vitals:   03/08/19 1203  BP: (!) 148/76  Pulse: 63  Temp: (!) 97.3 F (36.3 C)  TempSrc: Oral  SpO2: 96%  Weight: 155 lb 12.8 oz (70.7 kg)  Height: 5\' 5"  (1.651 m)     Physical Exam Constitutional:      Appearance: She is well-developed.  HENT:     Head: Normocephalic and atraumatic.  Eyes:     General: No scleral icterus.    Pupils: Pupils are equal, round, and reactive to light.  Cardiovascular:     Rate and Rhythm: Normal rate and regular rhythm.     Heart sounds: Normal heart sounds.  Pulmonary:     Effort: Pulmonary effort is normal.     Breath sounds: Normal breath sounds.  Abdominal:     Palpations: Abdomen is soft.  Skin:    General: Skin is warm and dry.  Neurological:     Mental Status: She is  alert and oriented to person, place, and time.  Psychiatric:        Behavior: Behavior normal.         Assessment & Plan    1. Frequency of urination  - POCT Urinalysis Dipstick--positive dipstick.  Sent for culture.  2. Urinary tract infection with hematuria, site unspecified  - Urine Culture  3. Benign essential HTN Fair control blood pressure.  See her back in a couple of months and hopefully she feels better.  4. Atrial fibrillation, unspecified type ([place]) On Xarelto.  5. Esophagitis, reflux Clinically improved.  6. Mild major depression ([place]) On sertraline.  In partial remission.     Tristin Vandeusen Cranford Mon, MD  [facility]

## 2019-03-10 LAB — SPECIMEN STATUS REPORT

## 2019-03-10 LAB — URINE CULTURE

## 2019-03-13 ENCOUNTER — Telehealth: Payer: Self-pay | Admitting: Family Medicine

## 2019-03-13 NOTE — Telephone Encounter (Signed)
Please review

## 2019-03-13 NOTE — Telephone Encounter (Signed)
Patient wants results of Urine culture from last week.

## 2019-03-14 ENCOUNTER — Other Ambulatory Visit: Payer: Self-pay | Admitting: Family Medicine

## 2019-03-14 DIAGNOSIS — R350 Frequency of micturition: Secondary | ICD-10-CM

## 2019-03-14 MED ORDER — NITROFURANTOIN MONOHYD MACRO 100 MG PO CAPS: 100.0000 mg | ORAL_CAPSULE | Freq: Two times a day (BID) | ORAL | 0 refills | Status: AC

## 2019-04-20 ENCOUNTER — Other Ambulatory Visit: Payer: Self-pay

## 2019-04-20 DIAGNOSIS — E039 Hypothyroidism, unspecified: Secondary | ICD-10-CM

## 2019-04-20 MED ORDER — LEVOTHYROXINE SODIUM 75 MCG PO TABS: 75.0000 ug | ORAL_TABLET | Freq: Every day | ORAL | 1 refills | Status: DC

## 2019-05-07 DIAGNOSIS — I493 Ventricular premature depolarization: Secondary | ICD-10-CM | POA: Diagnosis not present

## 2019-05-07 DIAGNOSIS — I480 Paroxysmal atrial fibrillation: Secondary | ICD-10-CM | POA: Diagnosis not present

## 2019-05-07 DIAGNOSIS — E782 Mixed hyperlipidemia: Secondary | ICD-10-CM | POA: Diagnosis not present

## 2019-05-07 DIAGNOSIS — I10 Essential (primary) hypertension: Secondary | ICD-10-CM | POA: Diagnosis not present

## 2019-05-10 ENCOUNTER — Encounter: Payer: Self-pay | Admitting: Family Medicine

## 2019-05-10 ENCOUNTER — Ambulatory Visit (INDEPENDENT_AMBULATORY_CARE_PROVIDER_SITE_OTHER): Payer: Medicare Other | Admitting: Family Medicine

## 2019-05-10 ENCOUNTER — Other Ambulatory Visit: Payer: Self-pay

## 2019-05-10 VITALS — BP 132/74 | HR 64 | Temp 98.6°F | Resp 16 | Ht 65.0 in | Wt 154.0 lb

## 2019-05-10 DIAGNOSIS — R197 Diarrhea, unspecified: Secondary | ICD-10-CM

## 2019-05-10 DIAGNOSIS — E039 Hypothyroidism, unspecified: Secondary | ICD-10-CM | POA: Diagnosis not present

## 2019-05-10 DIAGNOSIS — I4891 Unspecified atrial fibrillation: Secondary | ICD-10-CM

## 2019-05-10 DIAGNOSIS — I10 Essential (primary) hypertension: Secondary | ICD-10-CM | POA: Diagnosis not present

## 2019-05-10 DIAGNOSIS — K2970 Gastritis, unspecified, without bleeding: Secondary | ICD-10-CM

## 2019-05-10 NOTE — Progress Notes (Signed)
Patient: Tara Marquez Female    DOB: 11-18-1941   78 y.o.   MRN: [id number] Visit Date: 05/10/2019  Today's Provider: [name], MD   Chief Complaint  Patient presents with  . Hypertension  . Dizziness   Subjective:     HPI  Patient comes in today for a follow up. She was last seen in the office 2 months ago. She was seen for a UTI, but her blood pressure was elevated. She reports that she is feeling much better today.  BP Readings from Last 3 Encounters:  05/10/19 132/74  03/08/19 (!) 148/76  01/11/19 (!) 147/77   Patient reports that she is also having off and on symptoms of vertigo. She reports that this has been ongoing for about 3 months. She does not have symptoms every day. She reports that symptoms only lasts a few mins then subside once she starts her day. She normally experiences this in the mornings.   Allergies  Allergen Reactions  . Sulfa Antibiotics Hives  . Amoxicillin Rash  . Penicillins Rash     Current Outpatient Medications:  .  amiodarone (PACERONE) 200 MG tablet, Take 1 tablet (200 mg total) by mouth 2 (two) times daily., Disp: 50 tablet, Rfl: 0 .  atorvastatin (LIPITOR) 40 MG tablet, Take 1 tablet (40 mg total) by mouth daily at 6 PM., Disp: 30 tablet, Rfl: 0 .  levothyroxine (SYNTHROID) 75 MCG tablet, Take 1 tablet (75 mcg total) by mouth daily., Disp: 90 tablet, Rfl: 1 .  metoprolol tartrate (LOPRESSOR) 25 MG tablet, Take 1 tablet by mouth as needed., Disp: , Rfl:  .  nitroGLYCERIN (NITROSTAT) 0.4 MG SL tablet, Place 1 tablet (0.4 mg total) under the tongue every 5 (five) minutes as needed for chest pain., Disp: 30 tablet, Rfl: 2 .  pantoprazole (PROTONIX) 40 MG tablet, Take 1 tablet (40 mg total) by mouth daily., Disp: 30 tablet, Rfl: 3 .  rivaroxaban (XARELTO) 20 MG TABS tablet, Take 1 tablet (20 mg total) by mouth daily with supper., Disp: 30 tablet, Rfl: 0 .  sertraline (ZOLOFT) 100 MG tablet, TAKE 1 AND 1/2 TABLETS EVERY DAY (NEED  TO MAKE APPOINTMENT), Disp: 135 tablet, Rfl: 0 .  sucralfate (CARAFATE) 1 g tablet, Take 1 tablet (1 g total) by mouth 4 (four) times daily -  with meals and at bedtime., Disp: 120 tablet, Rfl: 12  Review of Systems  Constitutional: Negative for activity change, appetite change, chills, diaphoresis, fatigue, fever and unexpected weight change.  HENT: Negative.   Eyes: Negative.   Respiratory: Negative for cough and shortness of breath.   Cardiovascular: Negative for chest pain, palpitations and leg swelling.  Endocrine: Negative.   Allergic/Immunologic: Negative for environmental allergies.  Neurological: Positive for dizziness. Negative for tremors, syncope, weakness, light-headedness and headaches.  Psychiatric/Behavioral: Negative for agitation, self-injury, sleep disturbance and suicidal ideas. The patient is not nervous/anxious.     Social History   Tobacco Use  . Smoking status: Never Smoker  . Smokeless tobacco: Never Used  Substance Use Topics  . Alcohol use: No      Objective:   BP 132/74 (BP Location: Left Arm, Patient Position: Sitting, Cuff Size: Normal)   Pulse 64   Temp 98.6 F (37 C)   Resp 16   Ht 5\' 5"  (1.651 m)   Wt 154 lb (69.9 kg)   SpO2 98%   BMI 25.63 kg/m  Vitals:   05/10/19 1022  BP: 132/74  Pulse: 64  Resp: 16  Temp: 98.6 F (37 C)  SpO2: 98%  Weight: 154 lb (69.9 kg)  Height: 5\' 5"  (1.651 m)     Physical Exam Vitals signs reviewed.  Constitutional:      Appearance: She is well-developed.  HENT:     Head: Normocephalic and atraumatic.  Eyes:     General: No scleral icterus.    Pupils: Pupils are equal, round, and reactive to light.  Cardiovascular:     Rate and Rhythm: Normal rate and regular rhythm.     Heart sounds: Normal heart sounds.  Pulmonary:     Effort: Pulmonary effort is normal.     Breath sounds: Normal breath sounds.  Abdominal:     Palpations: Abdomen is soft.     Tenderness: There is no abdominal tenderness.   Skin:    General: Skin is warm and dry.  Neurological:     Mental Status: She is alert and oriented to person, place, and time.  Psychiatric:        Mood and Affect: Mood normal.        Behavior: Behavior normal.        Thought Content: Thought content normal.        Judgment: Judgment normal.         Assessment & Plan    1. Gastritis without bleeding, unspecified chronicity, unspecified gastritis type Improving. - CBC with Differential/Platelet  2. Diarrhea, unspecified type Try probiotic.  3. Adult hypothyroidism  - TSH  4. Atrial fibrillation, unspecified type ([place])   5. Benign essential HTN On Toprol. - Comprehensive metabolic panel     [name], MD  [facility]

## 2019-05-10 NOTE — Patient Instructions (Signed)
Try over the counter Probiotics to help with stomach issues.

## 2019-05-11 ENCOUNTER — Telehealth: Payer: Self-pay

## 2019-05-11 LAB — CBC WITH DIFFERENTIAL/PLATELET
Basophils Absolute: 0.1 10*3/uL (ref 0.0–0.2)
Basos: 1 %
EOS (ABSOLUTE): 0.1 10*3/uL (ref 0.0–0.4)
Eos: 1 %
Hematocrit: 37.3 % (ref 34.0–46.6)
Hemoglobin: 12.1 g/dL (ref 11.1–15.9)
Immature Grans (Abs): 0 10*3/uL (ref 0.0–0.1)
Immature Granulocytes: 0 %
Lymphocytes Absolute: 1.2 10*3/uL (ref 0.7–3.1)
Lymphs: 18 %
MCH: 26.8 pg (ref 26.6–33.0)
MCHC: 32.4 g/dL (ref 31.5–35.7)
MCV: 83 fL (ref 79–97)
Monocytes Absolute: 0.4 10*3/uL (ref 0.1–0.9)
Monocytes: 7 %
Neutrophils Absolute: 4.8 10*3/uL (ref 1.4–7.0)
Neutrophils: 73 %
Platelets: 190 10*3/uL (ref 150–450)
RBC: 4.51 x10E6/uL (ref 3.77–5.28)
RDW: 14.5 % (ref 11.7–15.4)
WBC: 6.5 10*3/uL (ref 3.4–10.8)

## 2019-05-11 LAB — COMPREHENSIVE METABOLIC PANEL
ALT: 10 IU/L (ref 0–32)
AST: 16 IU/L (ref 0–40)
Albumin/Globulin Ratio: 1.8 (ref 1.2–2.2)
Albumin: 4.2 g/dL (ref 3.7–4.7)
Alkaline Phosphatase: 112 IU/L (ref 39–117)
BUN/Creatinine Ratio: 18 (ref 12–28)
BUN: 17 mg/dL (ref 8–27)
Bilirubin Total: 0.6 mg/dL (ref 0.0–1.2)
CO2: 22 mmol/L (ref 20–29)
Calcium: 9.2 mg/dL (ref 8.7–10.3)
Chloride: 104 mmol/L (ref 96–106)
Creatinine, Ser: 0.97 mg/dL (ref 0.57–1.00)
GFR calc Af Amer: 65 mL/min/{1.73_m2} (ref >59)
GFR calc non Af Amer: 57 mL/min/{1.73_m2} — ABNORMAL LOW (ref >59)
Globulin, Total: 2.3 g/dL (ref 1.5–4.5)
Glucose: 96 mg/dL (ref 65–99)
Potassium: 4.6 mmol/L (ref 3.5–5.2)
Sodium: 142 mmol/L (ref 134–144)
Total Protein: 6.5 g/dL (ref 6.0–8.5)

## 2019-05-11 LAB — TSH: TSH: 3.51 u[IU]/mL (ref 0.450–4.500)

## 2019-05-11 NOTE — Telephone Encounter (Signed)
-----   Message from Jerrol Banana., MD sent at 05/11/2019 10:02 AM EDT ----- Labs good

## 2019-05-11 NOTE — Telephone Encounter (Signed)
Patient notified of lab results

## 2019-06-01 ENCOUNTER — Other Ambulatory Visit: Payer: Self-pay

## 2019-06-01 DIAGNOSIS — R1013 Epigastric pain: Secondary | ICD-10-CM

## 2019-06-01 DIAGNOSIS — F320 Major depressive disorder, single episode, mild: Secondary | ICD-10-CM

## 2019-06-01 MED ORDER — SERTRALINE HCL 100 MG PO TABS: ORAL_TABLET | ORAL | 0 refills | Status: DC

## 2019-06-01 MED ORDER — PANTOPRAZOLE SODIUM 40 MG PO TBEC: 40.0000 mg | DELAYED_RELEASE_TABLET | Freq: Every day | ORAL | 3 refills | Status: DC

## 2019-06-01 NOTE — Telephone Encounter (Signed)
Patient called requesting refills on Pantoprazole and Setraline

## 2019-08-10 DIAGNOSIS — D2262 Melanocytic nevi of left upper limb, including shoulder: Secondary | ICD-10-CM | POA: Diagnosis not present

## 2019-08-10 DIAGNOSIS — Z85828 Personal history of other malignant neoplasm of skin: Secondary | ICD-10-CM | POA: Diagnosis not present

## 2019-08-10 DIAGNOSIS — S50861A Insect bite (nonvenomous) of right forearm, initial encounter: Secondary | ICD-10-CM | POA: Diagnosis not present

## 2019-08-10 DIAGNOSIS — X32XXXA Exposure to sunlight, initial encounter: Secondary | ICD-10-CM | POA: Diagnosis not present

## 2019-08-10 DIAGNOSIS — D2261 Melanocytic nevi of right upper limb, including shoulder: Secondary | ICD-10-CM | POA: Diagnosis not present

## 2019-08-10 DIAGNOSIS — S50862A Insect bite (nonvenomous) of left forearm, initial encounter: Secondary | ICD-10-CM | POA: Diagnosis not present

## 2019-08-10 DIAGNOSIS — S80862A Insect bite (nonvenomous), left lower leg, initial encounter: Secondary | ICD-10-CM | POA: Diagnosis not present

## 2019-08-10 DIAGNOSIS — S80861A Insect bite (nonvenomous), right lower leg, initial encounter: Secondary | ICD-10-CM | POA: Diagnosis not present

## 2019-08-10 DIAGNOSIS — D2271 Melanocytic nevi of right lower limb, including hip: Secondary | ICD-10-CM | POA: Diagnosis not present

## 2019-08-10 DIAGNOSIS — L570 Actinic keratosis: Secondary | ICD-10-CM | POA: Diagnosis not present

## 2019-09-05 DIAGNOSIS — I493 Ventricular premature depolarization: Secondary | ICD-10-CM | POA: Diagnosis not present

## 2019-09-05 DIAGNOSIS — I10 Essential (primary) hypertension: Secondary | ICD-10-CM | POA: Diagnosis not present

## 2019-09-05 DIAGNOSIS — E782 Mixed hyperlipidemia: Secondary | ICD-10-CM | POA: Diagnosis not present

## 2019-09-05 DIAGNOSIS — I480 Paroxysmal atrial fibrillation: Secondary | ICD-10-CM | POA: Diagnosis not present

## 2019-09-26 ENCOUNTER — Other Ambulatory Visit: Payer: Self-pay

## 2019-09-26 ENCOUNTER — Encounter: Payer: Self-pay | Admitting: Physician Assistant

## 2019-09-26 ENCOUNTER — Ambulatory Visit (INDEPENDENT_AMBULATORY_CARE_PROVIDER_SITE_OTHER): Payer: Medicare Other | Admitting: Physician Assistant

## 2019-09-26 VITALS — Wt 155.0 lb

## 2019-09-26 DIAGNOSIS — R059 Cough, unspecified: Secondary | ICD-10-CM

## 2019-09-26 DIAGNOSIS — R05 Cough: Secondary | ICD-10-CM

## 2019-09-26 DIAGNOSIS — Z20822 Contact with and (suspected) exposure to covid-19: Secondary | ICD-10-CM

## 2019-09-26 NOTE — Progress Notes (Signed)
Patient: Tara Marquez Female    DOB: 1941-11-06   78 y.o.   MRN: [id number] Visit Date: 09/26/2019  Today's Provider: Trinna Post, PA-C   Chief Complaint  Patient presents with  . Sore Throat   Subjective:    Virtual Visit via Telephone Note  I connected with [name] on 09/26/19 at 10:40 AM EDT by telephone and verified that I am speaking with the correct person using two identifiers.   I discussed the limitations, risks, security and privacy concerns of performing an evaluation and management service by telephone and the availability of in person appointments. I also discussed with the patient that there may be a patient responsible charge related to this service. The patient expressed understanding and agreed to proceed.  Patient location: home Provider location: [place] office  Persons involved in the visit: patient, provider   HPI   Upper Respiratory Infection: Patient complains of symptoms of a URI. Symptoms include runny nose. Onset of symptoms was 3 days ago, unchanged since that time. She also c/o sore throat for the past 3 days .  She is drinking plenty of fluids. Evaluation to date: none. Treatment to date: none. Denies fevers, chest pain, SOB, confusion, syncope.     Allergies  Allergen Reactions  . Sulfa Antibiotics Hives  . Amoxicillin Rash  . Penicillins Rash     Current Outpatient Medications:  .  amiodarone (PACERONE) 200 MG tablet, Take 1 tablet (200 mg total) by mouth 2 (two) times daily. (Patient taking differently: Take 100 mg by mouth daily. ), Disp: 50 tablet, Rfl: 0 .  atorvastatin (LIPITOR) 40 MG tablet, Take 1 tablet (40 mg total) by mouth daily at 6 PM., Disp: 30 tablet, Rfl: 0 .  levothyroxine (SYNTHROID) 75 MCG tablet, Take 1 tablet (75 mcg total) by mouth daily., Disp: 90 tablet, Rfl: 1 .  metoprolol tartrate (LOPRESSOR) 25 MG tablet, Take 1 tablet by mouth as needed., Disp: , Rfl:  .  nitroGLYCERIN  (NITROSTAT) 0.4 MG SL tablet, Place 1 tablet (0.4 mg total) under the tongue every 5 (five) minutes as needed for chest pain., Disp: 30 tablet, Rfl: 2 .  pantoprazole (PROTONIX) 40 MG tablet, Take 1 tablet (40 mg total) by mouth daily., Disp: 30 tablet, Rfl: 3 .  rivaroxaban (XARELTO) 20 MG TABS tablet, Take 1 tablet (20 mg total) by mouth daily with supper., Disp: 30 tablet, Rfl: 0 .  sertraline (ZOLOFT) 100 MG tablet, TAKE 1 AND 1/2 TABLETS EVERY DAY (NEED TO MAKE APPOINTMENT), Disp: 135 tablet, Rfl: 0 .  sucralfate (CARAFATE) 1 g tablet, Take 1 tablet (1 g total) by mouth 4 (four) times daily -  with meals and at bedtime., Disp: 120 tablet, Rfl: 12  Review of Systems  Constitutional: Negative.   HENT: Positive for rhinorrhea and sore throat.   Respiratory: Positive for cough.     Social History   Tobacco Use  . Smoking status: Never Smoker  . Smokeless tobacco: Never Used  Substance Use Topics  . Alcohol use: No      Objective:   Wt 155 lb (70.3 kg)   BMI 25.79 kg/m  Vitals:   09/26/19 1023  Weight: 155 lb (70.3 kg)  Body mass index is 25.79 kg/m.   Physical Exam   No results found for any visits on 09/26/19.     Assessment & Plan    1. Cough  Will test for COVID, explained process and  isolation guidelines. May take mucinex and 2nd gen antihistamine for nasal congestion.   - Novel Coronavirus, NAA ([facility])  The entirety of the information documented in the History of Present Illness, Review of Systems and Physical Exam were personally obtained by me. Portions of this information were initially documented by Lynford Humphrey, CMA and reviewed by me for thoroughness and accuracy.   F/u PRN       Trinna Post, PA-C  [facility]

## 2019-09-26 NOTE — Patient Instructions (Signed)

## 2019-09-27 LAB — NOVEL CORONAVIRUS, NAA: SARS-CoV-2, NAA: NOT DETECTED

## 2019-10-03 ENCOUNTER — Ambulatory Visit: Payer: Self-pay

## 2019-10-17 ENCOUNTER — Ambulatory Visit (INDEPENDENT_AMBULATORY_CARE_PROVIDER_SITE_OTHER): Payer: Medicare Other

## 2019-10-17 ENCOUNTER — Other Ambulatory Visit: Payer: Self-pay

## 2019-10-17 DIAGNOSIS — Z23 Encounter for immunization: Secondary | ICD-10-CM

## 2019-10-18 ENCOUNTER — Other Ambulatory Visit: Payer: Self-pay | Admitting: Family Medicine

## 2019-10-18 DIAGNOSIS — F320 Major depressive disorder, single episode, mild: Secondary | ICD-10-CM

## 2019-10-18 DIAGNOSIS — R1013 Epigastric pain: Secondary | ICD-10-CM

## 2019-11-13 NOTE — Progress Notes (Signed)
Patient: Tara Marquez Female    DOB: [birth year]   78 y.o.   MRN: [id number] Visit Date: 11/14/2019  Today's Provider: [name], MD   Chief Complaint  Patient presents with  . Follow-up  . Hypertension  . Hypothyroidism  . Atrial Fibrillation   Subjective:     HPI  Overall patient has been doing fairly well recently. Gastritis without bleeding, unspecified chronicity, unspecified gastritis type From 05/10/2019-Improving. Labs good.  Diarrhea, unspecified type From 05/10/2019-Try probiotic.  Adult hypothyroidism From 05/10/2019-labs good.  Atrial fibrillation, unspecified type ([place]) From 05/10/2019-labs good.  Benign essential HTN From 05/10/2019-labs good. On Toprol.    Allergies  Allergen Reactions  . Sulfa Antibiotics Hives  . Amoxicillin Rash  . Penicillins Rash     Current Outpatient Medications:  .  amiodarone (PACERONE) 200 MG tablet, Take 1 tablet (200 mg total) by mouth 2 (two) times daily. (Patient taking differently: Take 100 mg by mouth daily. ), Disp: 50 tablet, Rfl: 0 .  atorvastatin (LIPITOR) 40 MG tablet, Take 1 tablet (40 mg total) by mouth daily at 6 PM., Disp: 30 tablet, Rfl: 0 .  levothyroxine (SYNTHROID) 75 MCG tablet, Take 1 tablet (75 mcg total) by mouth daily., Disp: 90 tablet, Rfl: 1 .  metoprolol tartrate (LOPRESSOR) 25 MG tablet, Take 1 tablet by mouth as needed., Disp: , Rfl:  .  nitroGLYCERIN (NITROSTAT) 0.4 MG SL tablet, Place 1 tablet (0.4 mg total) under the tongue every 5 (five) minutes as needed for chest pain., Disp: 30 tablet, Rfl: 2 .  pantoprazole (PROTONIX) 40 MG tablet, Take 1 tablet by mouth once daily, Disp: 90 tablet, Rfl: 0 .  rivaroxaban (XARELTO) 20 MG TABS tablet, Take 1 tablet (20 mg total) by mouth daily with supper., Disp: 30 tablet, Rfl: 0 .  sertraline (ZOLOFT) 100 MG tablet, TAKE 1 & 1/2 (ONE & ONE-HALF) TABLETS BY MOUTH ONCE DAILY NEED  TO  MAKE  APPOINTMENT, Disp: 135 tablet, Rfl: 3 .   sucralfate (CARAFATE) 1 g tablet, Take 1 tablet (1 g total) by mouth 4 (four) times daily -  with meals and at bedtime. (Patient not taking: Reported on 11/14/2019), Disp: 120 tablet, Rfl: 12  Review of Systems  Constitutional: Negative for appetite change, chills, fatigue and fever.  Eyes: Negative.   Respiratory: Negative for chest tightness and shortness of breath.   Cardiovascular: Negative for chest pain and palpitations.  Gastrointestinal: Negative for abdominal pain, nausea and vomiting.  Endocrine: Negative.   Allergic/Immunologic: Negative.   Neurological: Negative for dizziness and weakness.  Psychiatric/Behavioral: Negative.     Social History   Tobacco Use  . Smoking status: Never Smoker  . Smokeless tobacco: Never Used  Substance Use Topics  . Alcohol use: No      Objective:   BP 130/60 (BP Location: Right Arm, Patient Position: Sitting, Cuff Size: Large)   Pulse 69   Temp (!) 97.2 F (36.2 C) (Other (Comment))   Resp 18   Ht 5\' 5"  (1.651 m)   Wt 158 lb (71.7 kg)   SpO2 96%   BMI 26.29 kg/m  Vitals:   11/14/19 1148  BP: 130/60  Pulse: 69  Resp: 18  Temp: (!) 97.2 F (36.2 C)  TempSrc: Other (Comment)  SpO2: 96%  Weight: 158 lb (71.7 kg)  Height: 5\' 5"  (1.651 m)  Body mass index is 26.29 kg/m.   Physical Exam Vitals signs reviewed.  Constitutional:  Appearance: She is well-developed.  HENT:     Head: Normocephalic and atraumatic.  Eyes:     General: No scleral icterus.    Pupils: Pupils are equal, round, and reactive to light.  Cardiovascular:     Rate and Rhythm: Normal rate and regular rhythm.     Heart sounds: Normal heart sounds.  Pulmonary:     Effort: Pulmonary effort is normal.     Breath sounds: Normal breath sounds.  Abdominal:     Palpations: Abdomen is soft.     Tenderness: There is no abdominal tenderness.  Skin:    General: Skin is warm and dry.  Neurological:     Mental Status: She is alert and oriented to person,  place, and time.  Psychiatric:        Mood and Affect: Mood normal.        Behavior: Behavior normal.        Thought Content: Thought content normal.        Judgment: Judgment normal.      No results found for any visits on 11/14/19.     Assessment & Plan    1. Adult hypothyroidism  - CBC w/Diff/Platelet - Comprehensive Metabolic Panel (CMET) - Lipid panel - TSH  2. Benign essential HTN  - CBC w/Diff/Platelet - Comprehensive Metabolic Panel (CMET) - Lipid panel - TSH  3. Atrial fibrillation, unspecified type ([facility]) On Xarelto and amiodarone - CBC w/Diff/Platelet - Comprehensive Metabolic Panel (CMET) - Lipid panel - TSH  4. Hyperlipidemia, unspecified hyperlipidemia type  - CBC w/Diff/Platelet - Comprehensive Metabolic Panel (CMET) - Lipid panel - TSH 5.Gastritis Resolved.  Follow up in 6 months for CPE.     Richard Cranford Mon, MD  [facility]

## 2019-11-14 ENCOUNTER — Ambulatory Visit (INDEPENDENT_AMBULATORY_CARE_PROVIDER_SITE_OTHER): Payer: Medicare Other | Admitting: Family Medicine

## 2019-11-14 ENCOUNTER — Encounter: Payer: Self-pay | Admitting: Family Medicine

## 2019-11-14 ENCOUNTER — Other Ambulatory Visit: Payer: Self-pay

## 2019-11-14 VITALS — BP 130/60 | HR 69 | Temp 97.2°F | Resp 18 | Ht 65.0 in | Wt 158.0 lb

## 2019-11-14 DIAGNOSIS — E785 Hyperlipidemia, unspecified: Secondary | ICD-10-CM

## 2019-11-14 DIAGNOSIS — I10 Essential (primary) hypertension: Secondary | ICD-10-CM

## 2019-11-14 DIAGNOSIS — K2970 Gastritis, unspecified, without bleeding: Secondary | ICD-10-CM | POA: Diagnosis not present

## 2019-11-14 DIAGNOSIS — I4891 Unspecified atrial fibrillation: Secondary | ICD-10-CM

## 2019-11-14 DIAGNOSIS — E039 Hypothyroidism, unspecified: Secondary | ICD-10-CM

## 2019-11-15 DIAGNOSIS — E039 Hypothyroidism, unspecified: Secondary | ICD-10-CM | POA: Diagnosis not present

## 2019-11-15 DIAGNOSIS — E785 Hyperlipidemia, unspecified: Secondary | ICD-10-CM | POA: Diagnosis not present

## 2019-11-15 DIAGNOSIS — I10 Essential (primary) hypertension: Secondary | ICD-10-CM | POA: Diagnosis not present

## 2019-11-15 DIAGNOSIS — I4891 Unspecified atrial fibrillation: Secondary | ICD-10-CM | POA: Diagnosis not present

## 2019-11-16 LAB — CBC WITH DIFFERENTIAL/PLATELET
Basophils Absolute: 0.1 10*3/uL (ref 0.0–0.2)
Basos: 1 %
EOS (ABSOLUTE): 0.1 10*3/uL (ref 0.0–0.4)
Eos: 1 %
Hematocrit: 39.1 % (ref 34.0–46.6)
Hemoglobin: 13.1 g/dL (ref 11.1–15.9)
Immature Grans (Abs): 0 10*3/uL (ref 0.0–0.1)
Immature Granulocytes: 0 %
Lymphocytes Absolute: 1.6 10*3/uL (ref 0.7–3.1)
Lymphs: 25 %
MCH: 27.6 pg (ref 26.6–33.0)
MCHC: 33.5 g/dL (ref 31.5–35.7)
MCV: 82 fL (ref 79–97)
Monocytes Absolute: 0.4 10*3/uL (ref 0.1–0.9)
Monocytes: 7 %
Neutrophils Absolute: 4.2 10*3/uL (ref 1.4–7.0)
Neutrophils: 66 %
Platelets: 190 10*3/uL (ref 150–450)
RBC: 4.75 x10E6/uL (ref 3.77–5.28)
RDW: 13.7 % (ref 11.7–15.4)
WBC: 6.4 10*3/uL (ref 3.4–10.8)

## 2019-11-16 LAB — LIPID PANEL
Chol/HDL Ratio: 2.6 ratio (ref 0.0–4.4)
Cholesterol, Total: 152 mg/dL (ref 100–199)
HDL: 58 mg/dL (ref >39)
LDL Chol Calc (NIH): 78 mg/dL (ref 0–99)
Triglycerides: 84 mg/dL (ref 0–149)
VLDL Cholesterol Cal: 16 mg/dL (ref 5–40)

## 2019-11-16 LAB — COMPREHENSIVE METABOLIC PANEL
ALT: 11 IU/L (ref 0–32)
AST: 20 IU/L (ref 0–40)
Albumin/Globulin Ratio: 2 (ref 1.2–2.2)
Albumin: 4.5 g/dL (ref 3.7–4.7)
Alkaline Phosphatase: 109 IU/L (ref 39–117)
BUN/Creatinine Ratio: 22 (ref 12–28)
BUN: 22 mg/dL (ref 8–27)
Bilirubin Total: 0.6 mg/dL (ref 0.0–1.2)
CO2: 23 mmol/L (ref 20–29)
Calcium: 9.3 mg/dL (ref 8.7–10.3)
Chloride: 101 mmol/L (ref 96–106)
Creatinine, Ser: 1 mg/dL (ref 0.57–1.00)
GFR calc Af Amer: 63 mL/min/{1.73_m2} (ref >59)
GFR calc non Af Amer: 54 mL/min/{1.73_m2} — ABNORMAL LOW (ref >59)
Globulin, Total: 2.3 g/dL (ref 1.5–4.5)
Glucose: 92 mg/dL (ref 65–99)
Potassium: 4.1 mmol/L (ref 3.5–5.2)
Sodium: 141 mmol/L (ref 134–144)
Total Protein: 6.8 g/dL (ref 6.0–8.5)

## 2019-11-16 LAB — TSH: TSH: 4.01 u[IU]/mL (ref 0.450–4.500)

## 2019-11-26 ENCOUNTER — Telehealth: Payer: Self-pay

## 2019-11-26 NOTE — Telephone Encounter (Signed)
Copied from [place] [id number]. Topic: General - Inquiry >> Nov 26, 2019  9:26 AM Sheran Luz wrote: Patient would like to know if Dr. Rosanna Randy would be willing to work patient in today for possible UTI. She declined appointment with another provider. Requesting to see Dr. Rosanna Randy today. Please advise. >> Nov 26, 2019  2:54 PM Scherrie Gerlach wrote: Pt calling to advise if Dr Rosanna Randy will work her in. Pt requesting call back asap.  Pt has frequency and pain w/ urination. Dull ache all the time when she goes.

## 2019-11-26 NOTE — Telephone Encounter (Signed)
Per Dr. Rosanna Randy, pt was scheduled for tomorrow.

## 2019-11-27 ENCOUNTER — Encounter: Payer: Self-pay | Admitting: Family Medicine

## 2019-11-27 ENCOUNTER — Other Ambulatory Visit: Payer: Self-pay

## 2019-11-27 ENCOUNTER — Ambulatory Visit (INDEPENDENT_AMBULATORY_CARE_PROVIDER_SITE_OTHER): Payer: Medicare Other | Admitting: Family Medicine

## 2019-11-27 VITALS — BP 144/84 | HR 73 | Temp 96.9°F | Resp 16 | Ht 65.0 in | Wt 162.2 lb

## 2019-11-27 DIAGNOSIS — N390 Urinary tract infection, site not specified: Secondary | ICD-10-CM | POA: Diagnosis not present

## 2019-11-27 DIAGNOSIS — R300 Dysuria: Secondary | ICD-10-CM

## 2019-11-27 LAB — POCT URINALYSIS DIPSTICK
Bilirubin, UA: NEGATIVE
Glucose, UA: NEGATIVE
Ketones, UA: NEGATIVE
Nitrite, UA: NEGATIVE
Protein, UA: NEGATIVE
Spec Grav, UA: 1.02 (ref 1.010–1.025)
Urobilinogen, UA: 0.2 E.U./dL
pH, UA: 6 (ref 5.0–8.0)

## 2019-11-27 MED ORDER — NITROFURANTOIN MONOHYD MACRO 100 MG PO CAPS: 100.0000 mg | ORAL_CAPSULE | Freq: Two times a day (BID) | ORAL | 1 refills | Status: AC

## 2019-11-27 NOTE — Progress Notes (Signed)
Patient: Tara Marquez Female    DOB: 1941/01/05   78 y.o.   MRN: [id number] Visit Date: 11/27/2019  Today's Provider: [name], MD   Chief Complaint  Patient presents with  . Urinary Tract Infection   Subjective:     Urinary Tract Infection  This is a new problem. The current episode started in the past 7 days. The problem occurs intermittently. The quality of the pain is described as burning and aching. The pain is at a severity of 6/10. The pain is mild. There has been no fever. Associated symptoms include flank pain, frequency and urgency. Pertinent negatives include no chills, hematuria, nausea or vomiting. She has tried NSAIDs for the symptoms. The treatment provided mild relief.  She had taken AZO and it relieved some of the symptoms.  Her symptoms are basically resolved as of this morning.  Allergies  Allergen Reactions  . Sulfa Antibiotics Hives  . Amoxicillin Rash  . Penicillins Rash     Current Outpatient Medications:  .  amiodarone (PACERONE) 200 MG tablet, Take 1 tablet (200 mg total) by mouth 2 (two) times daily. (Patient taking differently: Take 100 mg by mouth daily. ), Disp: 50 tablet, Rfl: 0 .  atorvastatin (LIPITOR) 40 MG tablet, Take 1 tablet (40 mg total) by mouth daily at 6 PM., Disp: 30 tablet, Rfl: 0 .  levothyroxine (SYNTHROID) 75 MCG tablet, Take 1 tablet (75 mcg total) by mouth daily., Disp: 90 tablet, Rfl: 1 .  metoprolol tartrate (LOPRESSOR) 25 MG tablet, Take 1 tablet by mouth as needed., Disp: , Rfl:  .  nitroGLYCERIN (NITROSTAT) 0.4 MG SL tablet, Place 1 tablet (0.4 mg total) under the tongue every 5 (five) minutes as needed for chest pain., Disp: 30 tablet, Rfl: 2 .  pantoprazole (PROTONIX) 40 MG tablet, Take 1 tablet by mouth once daily, Disp: 90 tablet, Rfl: 0 .  rivaroxaban (XARELTO) 20 MG TABS tablet, Take 1 tablet (20 mg total) by mouth daily with supper., Disp: 30 tablet, Rfl: 0 .  sertraline (ZOLOFT) 100 MG tablet, TAKE 1 & 1/2  (ONE & ONE-HALF) TABLETS BY MOUTH ONCE DAILY NEED  TO  MAKE  APPOINTMENT, Disp: 135 tablet, Rfl: 3  Review of Systems  Constitutional: Negative for appetite change, chills, fatigue and fever.  HENT: Negative.   Eyes: Negative.   Respiratory: Negative for apnea, cough, choking, chest tightness, shortness of breath, wheezing and stridor.   Cardiovascular: Negative for chest pain, palpitations and leg swelling.  Gastrointestinal: Negative for abdominal distention, abdominal pain, anal bleeding, blood in stool, constipation, diarrhea, nausea, rectal pain and vomiting.  Endocrine: Negative.   Genitourinary: Positive for dysuria, flank pain, frequency, pelvic pain and urgency. Negative for decreased urine volume, difficulty urinating, dyspareunia, enuresis, genital sores, hematuria, menstrual problem, vaginal bleeding, vaginal discharge and vaginal pain.  Musculoskeletal: Negative for arthralgias, back pain, gait problem, joint swelling, myalgias, neck pain and neck stiffness.  Skin: Negative.   Allergic/Immunologic: Negative.   Neurological: Negative for dizziness, tremors, seizures, syncope, facial asymmetry, speech difficulty, weakness, light-headedness, numbness and headaches.  Hematological: Negative.   Psychiatric/Behavioral: Negative.     Social History   Tobacco Use  . Smoking status: Never Smoker  . Smokeless tobacco: Never Used  Substance Use Topics  . Alcohol use: No      Objective:   BP (!) 144/84   Pulse 73   Temp (!) 96.9 F (36.1 C) (Temporal)   Resp 16   Ht  5\' 5"  (1.651 m)   Wt 162 lb 3.2 oz (73.6 kg)   BMI 26.99 kg/m  Vitals:   11/27/19 1354  BP: (!) 144/84  Pulse: 73  Resp: 16  Temp: (!) 96.9 F (36.1 C)  TempSrc: Temporal  Weight: 162 lb 3.2 oz (73.6 kg)  Height: 5\' 5"  (1.651 m)  Body mass index is 26.99 kg/m.   Physical Exam Vitals signs reviewed.  Constitutional:      Appearance: She is well-developed.  HENT:     Head: Normocephalic and  atraumatic.  Eyes:     General: No scleral icterus.    Pupils: Pupils are equal, round, and reactive to light.  Cardiovascular:     Rate and Rhythm: Normal rate and regular rhythm.     Heart sounds: Normal heart sounds.  Pulmonary:     Effort: Pulmonary effort is normal.     Breath sounds: Normal breath sounds.  Abdominal:     Palpations: Abdomen is soft.     Comments: No CVAT or abdominal tenderness  Skin:    General: Skin is warm and dry.  Neurological:     Mental Status: She is alert and oriented to person, place, and time.  Psychiatric:        Behavior: Behavior normal.      Results for orders placed or performed in visit on 11/27/19  POCT Urinalysis Dipstick  Result Value Ref Range   Color, UA Yellow    Clarity, UA Cloudy    Glucose, UA Negative Negative   Bilirubin, UA Negative    Ketones, UA Negative    Spec Grav, UA 1.020 1.010 - 1.025   Blood, UA Trace    pH, UA 6.0 5.0 - 8.0   Protein, UA Negative Negative   Urobilinogen, UA 0.2 0.2 or 1.0 E.U./dL   Nitrite, UA Negative    Leukocytes, UA Moderate (2+) (A) Negative   Appearance     Odor         Assessment & Plan    1. Dysuria Treat for 3 days. - POCT Urinalysis Dipstick - nitrofurantoin, macrocrystal-monohydrate, (MACROBID) 100 MG capsule; Take 1 capsule (100 mg total) by mouth 2 (two) times daily for 6 days.  Dispense: 12 capsule; Refill: 1  2. Urinary tract infection without hematuria, site unspecified I think this is resolved clinically..  Use of fluids ,cranberry juice and Azo - Urine Culture - nitrofurantoin, macrocrystal-monohydrate, (MACROBID) 100 MG capsule; Take 1 capsule (100 mg total) by mouth 2 (two) times daily for 6 days.  Dispense: 12 capsule; Refill: [place], MD  [facility]

## 2019-11-29 ENCOUNTER — Ambulatory Visit: Payer: Self-pay | Admitting: Family Medicine

## 2019-11-29 LAB — URINE CULTURE

## 2019-12-05 ENCOUNTER — Other Ambulatory Visit: Payer: Self-pay | Admitting: Family Medicine

## 2019-12-05 DIAGNOSIS — E039 Hypothyroidism, unspecified: Secondary | ICD-10-CM

## 2019-12-05 NOTE — Telephone Encounter (Signed)
Also, pharmacist wants to know if it is ok to switch patient's Levothyroxine to the new manufacture that the pharmacy is now using.

## 2019-12-05 NOTE — Telephone Encounter (Signed)
Requested Prescriptions  Pending Prescriptions Disp Refills  . levothyroxine (SYNTHROID) 75 MCG tablet [Pharmacy Med Name: Levothyroxine Sodium 75 MCG Oral Tablet] 90 tablet 0    Sig: Take 1 tablet by mouth once daily     Endocrinology:  Hypothyroid Agents Failed - 12/05/2019 10:36 AM      Failed - TSH needs to be rechecked within 3 months after an abnormal result. Refill until TSH is due.      Passed - TSH in normal range and within 360 days    TSH  Date Value Ref Range Status  11/15/2019 4.010 0.450 - 4.500 uIU/mL Final         Passed - Valid encounter within last 12 months    Recent Outpatient Visits          1 week ago Urinary tract infection without hematuria, site unspecified   [facility] Jerrol Banana., MD   3 weeks ago Benign essential HTN   [facility] Jerrol Banana., MD   2 months ago Cough   [place], [place]   6 months ago Gastritis without bleeding, unspecified chronicity, unspecified gastritis type   [facility] Jerrol Banana., MD   9 months ago Frequency of urination   [facility] Jerrol Banana., MD

## 2019-12-10 NOTE — Telephone Encounter (Signed)
Left detailed message giving verbal ok.

## 2019-12-10 NOTE — Telephone Encounter (Signed)
yes

## 2020-01-01 ENCOUNTER — Telehealth: Payer: Self-pay | Admitting: Family Medicine

## 2020-01-01 NOTE — Telephone Encounter (Signed)
yes

## 2020-01-01 NOTE — Telephone Encounter (Signed)
Pt states she can go to [employer] tomorrow from 7-4 to get a COVID vaccine, drive through.  Pt wants to know if PCP would say that is okay for her to do.

## 2020-01-01 NOTE — Telephone Encounter (Signed)
Patient advised as below.  

## 2020-01-03 ENCOUNTER — Other Ambulatory Visit: Payer: Self-pay | Admitting: Family Medicine

## 2020-01-03 DIAGNOSIS — E039 Hypothyroidism, unspecified: Secondary | ICD-10-CM

## 2020-01-06 ENCOUNTER — Ambulatory Visit: Payer: Medicare Other | Attending: Internal Medicine

## 2020-01-06 DIAGNOSIS — Z23 Encounter for immunization: Secondary | ICD-10-CM | POA: Diagnosis not present

## 2020-01-06 NOTE — Progress Notes (Signed)
   Covid-19 Vaccination Clinic  Name:  Tara Marquez    MRN: [id number] DOB: 1941/11/18  01/06/2020  [name] was observed post Covid-19 immunization for 30 minutes based on pre-vaccination screening without incidence. She was provided with Vaccine Information Sheet and instruction to access the V-Safe system.   [name] was instructed to call 911 with any severe reactions post vaccine: [place] Kitchen Difficulty breathing  . Swelling of your face and throat  . A fast heartbeat  . A bad rash all over your body  . Dizziness and weakness    Immunizations Administered    Name Date Dose VIS Date Route   Pfizer COVID-19 Vaccine 01/06/2020  1:54 PM 0.3 mL 12/07/2019 Intramuscular   Manufacturer: [employer], [employer]   Lot: [id number]   [place]: [id number]

## 2020-01-24 ENCOUNTER — Ambulatory Visit: Payer: Medicare Other

## 2020-01-26 ENCOUNTER — Ambulatory Visit: Payer: Medicare Other | Attending: Internal Medicine

## 2020-01-26 DIAGNOSIS — Z23 Encounter for immunization: Secondary | ICD-10-CM | POA: Insufficient documentation

## 2020-01-26 NOTE — Progress Notes (Signed)
   Covid-19 Vaccination Clinic  Name:  Tara Marquez    MRN: [id number] DOB: 1941-04-26  01/26/2020  Tara Marquez was observed post Covid-19 immunization for 15 minutes without incidence. She was provided with Vaccine Information Sheet and instruction to access the V-Safe system.   Tara Marquez was instructed to call 911 with any severe reactions post vaccine: Marland Kitchen Difficulty breathing  . Swelling of your face and throat  . A fast heartbeat  . A bad rash all over your body  . Dizziness and weakness    Immunizations Administered    Name Date Dose VIS Date Route   Pfizer COVID-19 Vaccine 01/26/2020  9:48 AM 0.3 mL 12/07/2019 Intramuscular   Manufacturer: [place]   Lot: [id number]   [place]: [id number]

## 2020-02-25 DIAGNOSIS — H2640 Unspecified secondary cataract: Secondary | ICD-10-CM | POA: Diagnosis not present

## 2020-02-25 DIAGNOSIS — H43813 Vitreous degeneration, bilateral: Secondary | ICD-10-CM | POA: Diagnosis not present

## 2020-02-28 IMAGING — CR DG CHEST 2V
2 series · 2 of 2 positions shown · non-contrast
Comparison: 03/03/2015

CLINICAL DATA: Medial chest pain

EXAM:
CHEST - 2 VIEW

[chest pa]
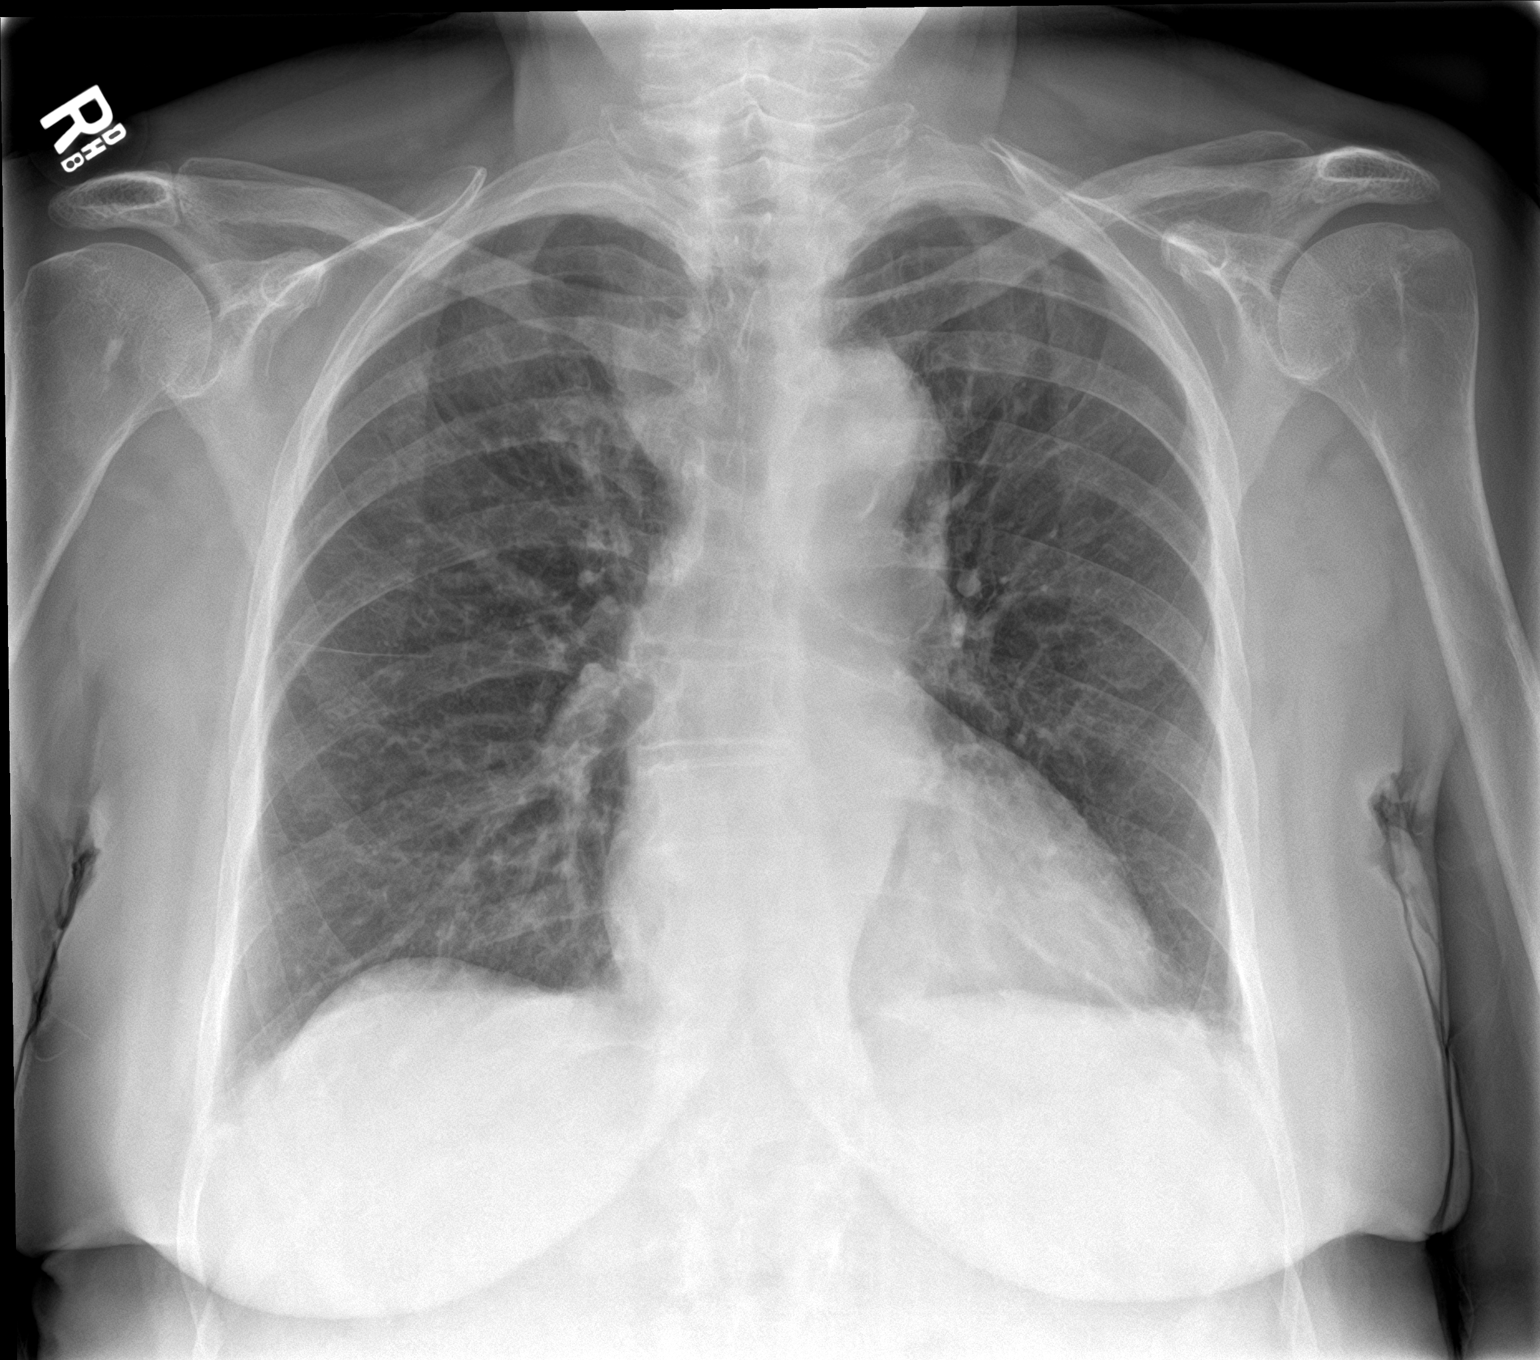

[chest lat]
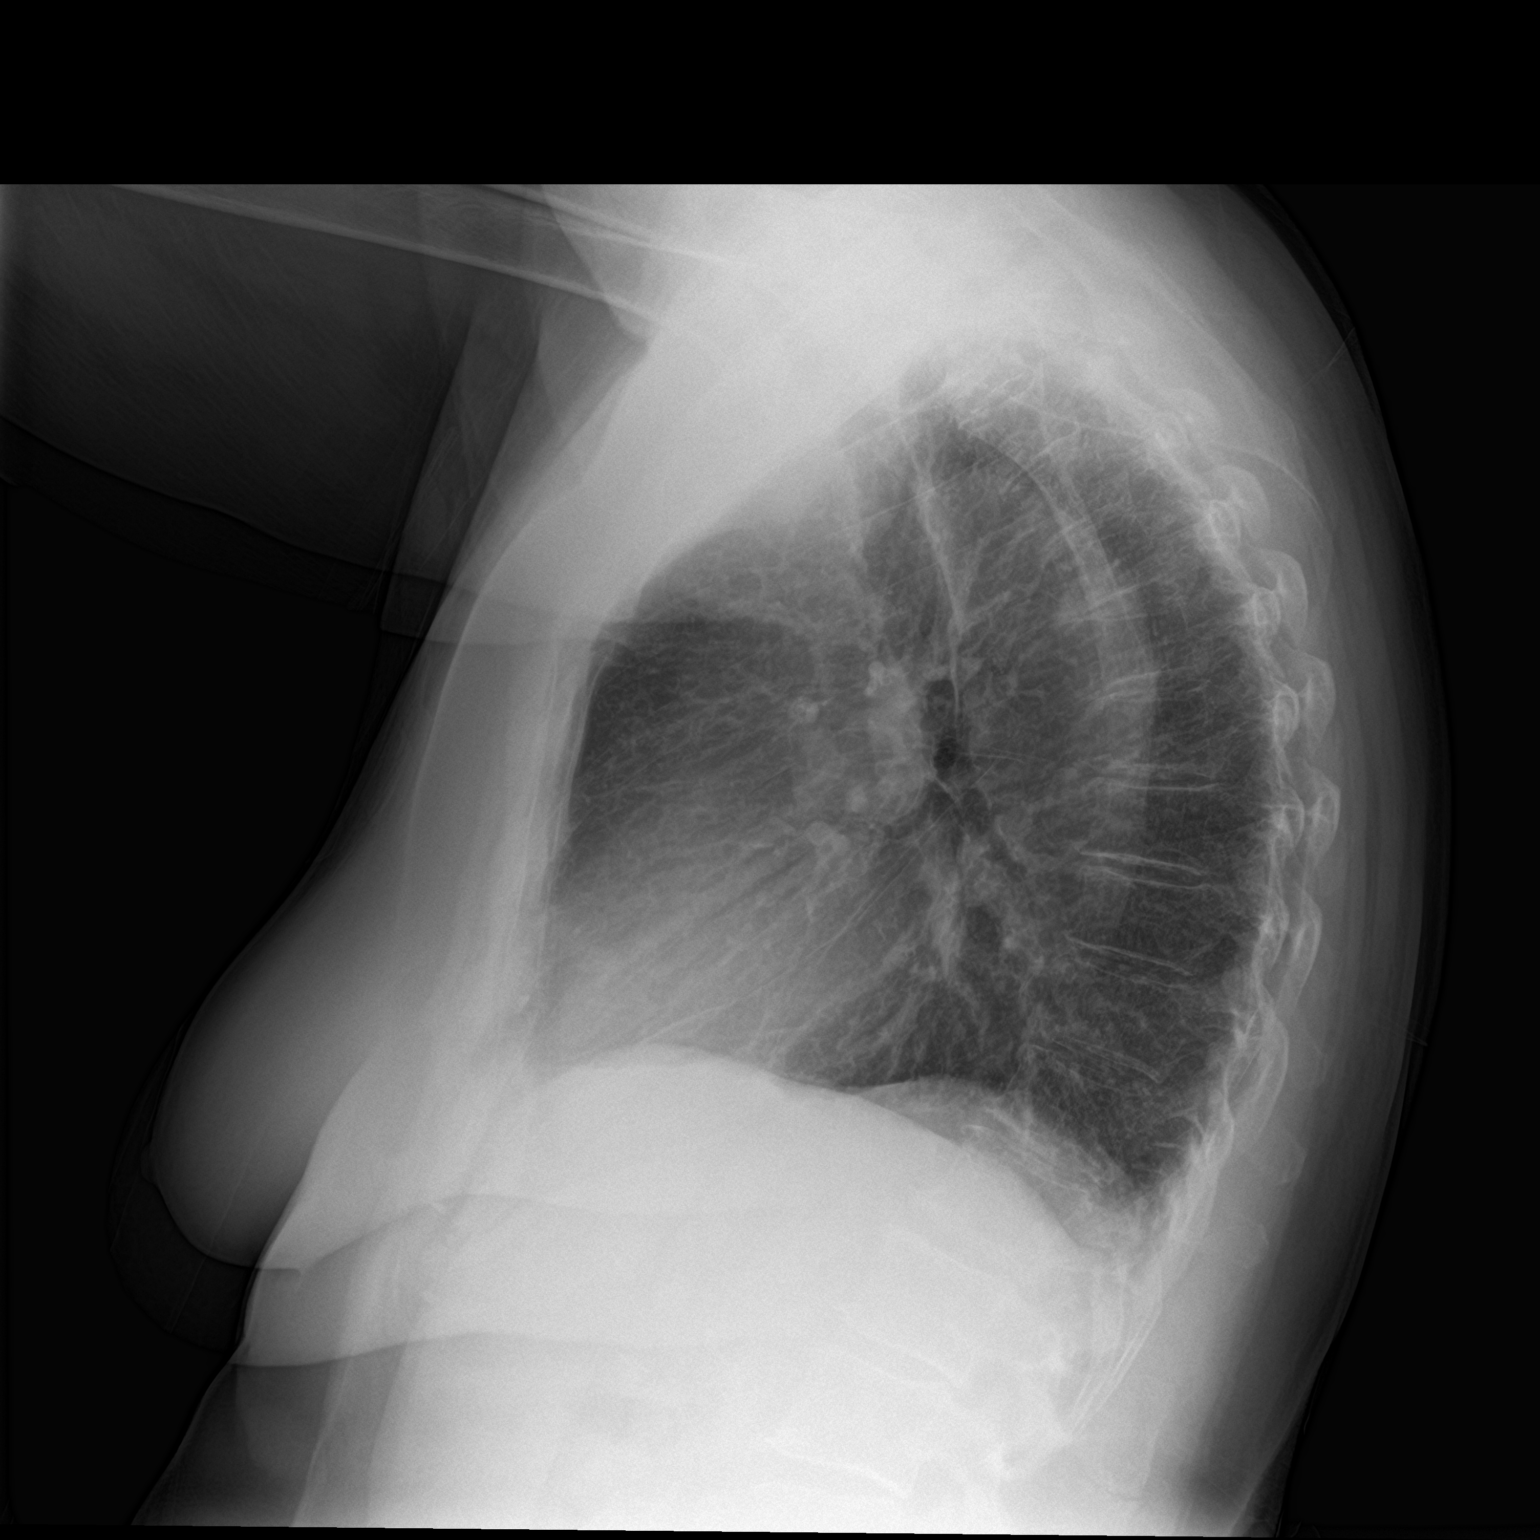

[2 of 2 positions shown; findings below may reference images not displayed]

FINDINGS: Linear left base atelectasis. Right lung clear. Heart is normal
size. No effusions or acute bony abnormality.
IMPRESSION: Left base atelectasis.  No active disease.

## 2020-04-02 DIAGNOSIS — I10 Essential (primary) hypertension: Secondary | ICD-10-CM | POA: Diagnosis not present

## 2020-04-02 DIAGNOSIS — I493 Ventricular premature depolarization: Secondary | ICD-10-CM | POA: Diagnosis not present

## 2020-04-02 DIAGNOSIS — E782 Mixed hyperlipidemia: Secondary | ICD-10-CM | POA: Diagnosis not present

## 2020-04-02 DIAGNOSIS — I480 Paroxysmal atrial fibrillation: Secondary | ICD-10-CM | POA: Diagnosis not present

## 2020-05-09 NOTE — Progress Notes (Signed)
Trena Platt Cummings,acting as a scribe for [name], MD.,have documented all relevant documentation on the behalf of [name], MD,as directed by  [name], MD while in the presence of [name], MD. Established patient visit   Patient: Tara Marquez   DOB: 1941/10/23   79 y.o. Female  MRN: [id number] Visit Date: 05/13/2020  Today's healthcare provider: [name], MD   Chief Complaint  Patient presents with  . Follow-up   Subjective    HPI Patient had AWV with [facility] today at 9:00 am.  In recent years or so has no energy and was asleep all the time.  She is stressed out taking care of her husband and her grandsons. She is not suicidal or homicidal. Hypertension, follow-up  BP Readings from Last 3 Encounters:  05/13/20 138/62  05/13/20 138/62  11/27/19 (!) 144/84   Wt Readings from Last 3 Encounters:  05/13/20 167 lb 6.4 oz (75.9 kg)  05/13/20 167 lb 6.4 oz (75.9 kg)  11/27/19 162 lb 3.2 oz (73.6 kg)     She was last seen for hypertension 6 months ago.  BP at that visit was 130/68. Management since that visit includes; controlled. She reports excellent compliance with treatment. She is not having side effects.  She is not exercising. She is adherent to low salt diet.   Outside blood pressures are not checking.  She does not smoke.  Use of agents associated with hypertension: none.   --------------------------------------------------------------------------------------------------- Lipid/Cholesterol, follow-up  Last Lipid Panel: Lab Results  Component Value Date   CHOL 152 11/15/2019   LDLCALC 78 11/15/2019   HDL 58 11/15/2019   TRIG 84 11/15/2019    She was last seen for this 6 months ago.  Management since that visit includes; labs checked showing-within normal limits. She reports excellent compliance with treatment. She is not having side effects.  She is following a Regular diet. Current exercise: yard  work  Last metabolic panel Lab Results  Component Value Date   GLUCOSE 92 11/15/2019   NA 141 11/15/2019   K 4.1 11/15/2019   BUN 22 11/15/2019   CREATININE 1.00 11/15/2019   GFRNONAA 54 (L) 11/15/2019   GFRAA 63 11/15/2019   CALCIUM 9.3 11/15/2019   AST 20 11/15/2019   ALT 11 11/15/2019   The 10-year ASCVD risk score Mikey Bussing DC Jr., et al., 2013) is: 32.5%  ---------------------------------------------------------------------------------------------------  Adult hypothyroidism From 11/14/2019-labs checked showing-within normal limits.  Atrial fibrillation, unspecified type ([place]) From 11/14/2019-On Xarelto and amiodarone.   Social History   Tobacco Use  . Smoking status: Never Smoker  . Smokeless tobacco: Never Used  Substance Use Topics  . Alcohol use: No  . Drug use: No       Medications: Outpatient Medications Prior to Visit  Medication Sig  . amiodarone (PACERONE) 200 MG tablet Take 1 tablet (200 mg total) by mouth 2 (two) times daily. (Patient taking differently: Take 100 mg by mouth daily. )  . atorvastatin (LIPITOR) 40 MG tablet Take 1 tablet (40 mg total) by mouth daily at 6 PM.  . EUTHYROX 75 MCG tablet Take 1 tablet by mouth once daily  . metoprolol tartrate (LOPRESSOR) 25 MG tablet Take 1 tablet by mouth as needed.  . nitroGLYCERIN (NITROSTAT) 0.4 MG SL tablet Place 1 tablet (0.4 mg total) under the tongue every 5 (five) minutes as needed for chest pain.  . pantoprazole (PROTONIX) 40 MG tablet Take 1 tablet by mouth  once daily  . rivaroxaban (XARELTO) 20 MG TABS tablet Take 1 tablet (20 mg total) by mouth daily with supper.  . sertraline (ZOLOFT) 100 MG tablet TAKE 1 & 1/2 (ONE & ONE-HALF) TABLETS BY MOUTH ONCE DAILY NEED  TO  MAKE  APPOINTMENT   No facility-administered medications prior to visit.    Review of Systems  Constitutional: Positive for fatigue.  HENT: Positive for rhinorrhea.   Eyes: Positive for discharge.  Respiratory: Negative.    Cardiovascular: Positive for palpitations.  Gastrointestinal: Positive for diarrhea.  Endocrine: Positive for cold intolerance.  Genitourinary: Negative.   Musculoskeletal: Positive for arthralgias.  Skin: Negative.   Allergic/Immunologic: Negative.   Neurological: Negative.   Hematological: Negative.   Psychiatric/Behavioral: The patient is nervous/anxious.        Objective    BP 138/62 (BP Location: Right Arm, Patient Position: Sitting, Cuff Size: Normal)   Pulse 70   Temp 97.8 F (36.6 C) (Oral)   Ht 5\' 5"  (1.651 m)   Wt 167 lb 6.4 oz (75.9 kg)   BMI 27.86 kg/m     Physical Exam Vitals reviewed.  Constitutional:      Appearance: She is well-developed.  HENT:     Head: Normocephalic and atraumatic.  Eyes:     General: No scleral icterus.    Pupils: Pupils are equal, round, and reactive to light.  Cardiovascular:     Rate and Rhythm: Normal rate and regular rhythm.     Heart sounds: Normal heart sounds.  Pulmonary:     Effort: Pulmonary effort is normal.     Breath sounds: Normal breath sounds.  Abdominal:     Palpations: Abdomen is soft.     Tenderness: There is no abdominal tenderness.  Musculoskeletal:     Right lower leg: No edema.     Left lower leg: No edema.  Skin:    General: Skin is warm and dry.  Neurological:     General: No focal deficit present.     Mental Status: She is alert and oriented to person, place, and time.  Psychiatric:        Mood and Affect: Mood normal.        Behavior: Behavior normal.        Thought Content: Thought content normal.        Judgment: Judgment normal.     Cologuard was negative on 03/06/2018  No results found for any visits on 05/13/20.  Assessment & Plan     1. Benign essential HTN Good control. - TSH - Lipid panel - CBC with Differential/Platelet - Comprehensive metabolic panel - POCT urinalysis dipstick  2. Hyperlipidemia, unspecified hyperlipidemia type  - TSH - Lipid panel - CBC with  Differential/Platelet - Comprehensive metabolic panel - POCT urinalysis dipstick  3. Adult hypothyroidism Check TSH in face of fatigue - TSH - Lipid panel - CBC with Differential/Platelet - Comprehensive metabolic panel - POCT urinalysis dipstick  4. Leukocytes in urine Asymptomatic bacteriuria - Urine Culture  5. Polymyalgia rheumatica (HCC) History of remote PMR.  Other than fatigue no other symptoms.  6. Fatigue due to depression   7. Reactive depression Start Wellbutrin XR 150 daily.  Consider counseling.  Return to clinic 1 month.   No follow-ups on file.      I, [name], MD, have reviewed all documentation for this visit. The documentation on 05/17/20 for the exam, diagnosis, procedures, and orders are all accurate and complete.    Lasondra Hodgkins Cranford Mon,  MD  [facility] [phone] (phone) [phone] (fax)  [place]

## 2020-05-12 NOTE — Progress Notes (Signed)
Subjective:   Tara Marquez is a 79 y.o. female who presents for Medicare Annual (Subsequent) preventive examination.  Review of Systems:  N/A  Cardiac Risk Factors include: advanced age (>89men, >47 women);dyslipidemia;hypertension     Objective:     Vitals: BP 138/62 (BP Location: Right Arm)   Pulse 70   Temp 97.8 F (36.6 C) (Oral)   Ht 5\' 5"  (1.651 m)   Wt 167 lb 6.4 oz (75.9 kg)   BMI 27.86 kg/m   Body mass index is 27.86 kg/m.  Advanced Directives 05/13/2020 10/30/2018 10/20/2018 10/20/2018 02/21/2018 12/09/2016  Does Patient Have a Medical Advance Directive? Yes Yes Yes Yes Yes No  Type of Paramedic of [place];Living will [place];Living will [place];Living will Healthcare Power of [place];Living will -  Does patient want to make changes to medical advance directive? - - No - Patient declined No - Patient declined - -  Copy of [place] in Chart? No - copy requested No - copy requested No - copy requested No - copy requested No - copy requested -    Tobacco Social History   Tobacco Use  Smoking Status Never Smoker  Smokeless Tobacco Never Used     Counseling given: Not Answered   Clinical Intake:  Pre-visit preparation completed: Yes  Pain : No/denies pain Pain Score: 0-No pain     Nutritional Status: BMI 25 -29 Overweight Nutritional Risks: Nausea/ vomitting/ diarrhea(Diarrhea sometimes due to IBS.) Diabetes: No  How often do you need to have someone help you when you read instructions, pamphlets, or other written materials from your doctor or pharmacy?: 1 - Never  Interpreter Needed?: No  Information entered by :: [facility], LPN  Past Medical History:  Diagnosis Date  . A-fib ([place])   . Cancer ([place])    skin ca  . Depression    mild major depression  . GERD (gastroesophageal reflux disease)   . Hyperlipidemia   . Hypertension      Past Surgical History:  Procedure Laterality Date  . ABDOMINAL HYSTERECTOMY  03/1971   Status Post Hysterectomy  . CATARACT EXTRACTION Left   . TONSILLECTOMY AND ADENOIDECTOMY  1951  . WISDOM TOOTH EXTRACTION  1970   Family History  Problem Relation Age of Onset  . Cancer Sister        Has had Breast Cancer and is in remission  . Breast cancer Sister 65  . Congenital heart disease Son    Social History   Socioeconomic History  . Marital status: Married    Spouse name: Not on file  . Number of children: 2  . Years of education: Not on file  . Highest education level: Some college, no degree  Occupational History  . Occupation: retired  Tobacco Use  . Smoking status: Never Smoker  . Smokeless tobacco: Never Used  Substance and Sexual Activity  . Alcohol use: No  . Drug use: No  . Sexual activity: Yes  Other Topics Concern  . Not on file  Social History Narrative  . Not on file   Social Determinants of Health   Financial Resource Strain: Low Risk   . Difficulty of Paying Living Expenses: Not hard at all  Food Insecurity: No Food Insecurity  . Worried About Charity fundraiser in the Last Year: Never true  . Ran Out of Food in the Last Year: Never true  Transportation Needs: No Transportation  Needs  . Lack of Transportation (Medical): No  . Lack of Transportation (Non-Medical): No  Physical Activity: Inactive  . Days of Exercise per Week: 0 days  . Minutes of Exercise per Session: 0 min  Stress: Stress Concern Present  . Feeling of Stress : Rather much  Social Connections: Not Isolated  . Frequency of Communication with Friends and Family: More than three times a week  . Frequency of Social Gatherings with Friends and Family: More than three times a week  . Attends Religious Services: More than 4 times per year  . Active Member of Clubs or Organizations: Yes  . Attends Archivist Meetings: More than 4 times per year  . Marital Status: Married     Outpatient Encounter Medications as of 05/13/2020  Medication Sig  . amiodarone (PACERONE) 200 MG tablet Take 1 tablet (200 mg total) by mouth 2 (two) times daily. (Patient taking differently: Take 100 mg by mouth daily. )  . atorvastatin (LIPITOR) 40 MG tablet Take 1 tablet (40 mg total) by mouth daily at 6 PM.  . EUTHYROX 75 MCG tablet Take 1 tablet by mouth once daily  . metoprolol tartrate (LOPRESSOR) 25 MG tablet Take 1 tablet by mouth as needed.  . nitroGLYCERIN (NITROSTAT) 0.4 MG SL tablet Place 1 tablet (0.4 mg total) under the tongue every 5 (five) minutes as needed for chest pain.  . pantoprazole (PROTONIX) 40 MG tablet Take 1 tablet by mouth once daily  . rivaroxaban (XARELTO) 20 MG TABS tablet Take 1 tablet (20 mg total) by mouth daily with supper.  . sertraline (ZOLOFT) 100 MG tablet TAKE 1 & 1/2 (ONE & ONE-HALF) TABLETS BY MOUTH ONCE DAILY NEED  TO  MAKE  APPOINTMENT   No facility-administered encounter medications on file as of 05/13/2020.    Activities of Daily Living In your present state of health, do you have any difficulty performing the following activities: 05/13/2020 11/27/2019  Hearing? N N  Vision? Y N  Comment Possible detached retina in left eye. Currently being evaluated by Dr George Ina @ [facility]. -  Difficulty concentrating or making decisions? N N  Walking or climbing stairs? N N  Dressing or bathing? N N  Doing errands, shopping? N N  Preparing Food and eating ? N -  Using the Toilet? N -  In the past six months, have you accidently leaked urine? Y -  Comment Occasionally, wears protection daily. -  Do you have problems with loss of bowel control? N -  Managing your Medications? N -  Managing your Finances? N -  Housekeeping or managing your Housekeeping? N -  Some recent data might be hidden    Patient Care Team: Jerrol Banana., MD as PCP - General (Family Medicine) Birder Robson, MD as Referring Physician (Ophthalmology) Corey Skains, MD as Consulting Physician (Cardiology) Oneta Rack, MD as Consulting Physician (Dermatology)    Assessment:   This is a routine wellness examination for Tara Marquez.  Exercise Activities and Dietary recommendations Current Exercise Habits: The patient does not participate in regular exercise at present, Exercise limited by: None identified  Goals    . DIET - INCREASE WATER INTAKE     Recommend increasing water intake to 4-6 glasses a day.        Fall Risk: Fall Risk  05/13/2020 11/27/2019 03/08/2019 02/21/2018 12/09/2016  Falls in the past year? 0 0 0 Yes Yes  Number falls in past yr: 0 0 - 1 1  Comment - - - or 2 -  Injury with Fall? 0 0 - No No  Follow up - - - Falls prevention discussed -    FALL RISK PREVENTION PERTAINING TO THE HOME:  Any stairs in or around the home? Yes  If so, are there any without handrails? No   Home free of loose throw rugs in walkways, pet beds, electrical cords, etc? Yes  Adequate lighting in your home to reduce risk of falls? Yes   ASSISTIVE DEVICES UTILIZED TO PREVENT FALLS:  Life alert? No  Use of a cane, walker or w/c? No  Grab bars in the bathroom? Yes  Shower chair or bench in shower? No  Elevated toilet seat or a handicapped toilet? Yes    TIMED UP AND GO:  Was the test performed? No .    Depression Screen PHQ 2/9 Scores 05/13/2020 11/27/2019 03/08/2019 10/30/2018  PHQ - 2 Score 4 0 0 0  PHQ- 9 Score 12 0 - -     Cognitive Function     6CIT Screen 05/13/2020 12/09/2016  What Year? 0 points 0 points  What month? 0 points 0 points  What time? 0 points 0 points  Count back from 20 0 points 0 points  Months in reverse 0 points 0 points  Repeat phrase 4 points 0 points  Total Score 4 0    Immunization History  Administered Date(s) Administered  . Fluad Quad(high Dose 65+) 10/17/2019  . Influenza Split 11/16/2012  . Influenza, High Dose Seasonal PF 10/01/2014, 10/04/2015, 10/14/2016, 10/27/2017, 10/04/2018  .  Influenza-Unspecified 09/26/2014  . PFIZER SARS-COV-2 Vaccination 01/06/2020, 01/26/2020  . Pneumococcal Conjugate-13 10/14/2016  . Pneumococcal Polysaccharide-23 10/12/2008    Qualifies for Shingles Vaccine? Yes . Due for Shingrix. Pt has been advised to call insurance company to determine out of pocket expense. Advised may also receive vaccine at local pharmacy or Health Dept. Verbalized acceptance and understanding.  Tdap: Although this vaccine is not a covered service during a Wellness Exam, does the patient still wish to receive this vaccine today?  No . Advised may receive this vaccine at local pharmacy or Health Dept. Aware to provide a copy of the vaccination record if obtained from local pharmacy or Health Dept. Verbalized acceptance and understanding.  Flu Vaccine: Up to date  Pneumococcal Vaccine: Completed series  Screening Tests Health Maintenance  Topic Date Due  . TETANUS/TDAP  05/13/2021 (Originally 03/03/1960)  . INFLUENZA VACCINE  07/27/2020  . DEXA SCAN  Completed  . COVID-19 Vaccine  Completed  . PNA vac Low Risk Adult  Completed    Cancer Screenings:  Colorectal Screening: No longer required.   Mammogram: No longer required.   Bone Density: Completed 01/15/09. Previous DEXA scan was normal. No repeat needed unless advised by a physician.  Lung Cancer Screening: (Low Dose CT Chest recommended if Age 1-80 years, 30 pack-year currently smoking OR have quit w/in 15years.) does not qualify.   Additional Screening:  Vision Screening: Recommended annual ophthalmology exams for early detection of glaucoma and other disorders of the eye.  Dental Screening: Recommended annual dental exams for proper oral hygiene  Community Resource Referral:  CRR required this visit? No     Plan:  I have personally reviewed and addressed the Medicare Annual Wellness questionnaire and have noted the following in the patient's chart:  A. Medical and social history B. Use of  alcohol, tobacco or illicit drugs  C. Current medications and supplements D. Functional ability and status E.  Nutritional status F.  Physical activity G. Advance directives H. List of other physicians I.  Hospitalizations, surgeries, and ER visits in previous 12 months J.  [place] such as hearing and vision if needed, cognitive and depression L. Referrals and appointments   In addition, I have reviewed and discussed with patient certain preventive protocols, quality metrics, and best practice recommendations. A written personalized care plan for preventive services as well as general preventive health recommendations were provided to patient. Nurse Health Advisor  Signed,    Chantia Amalfitano Carter, [place]  [id number] Nurse Health Advisor   Nurse Notes: None.

## 2020-05-13 ENCOUNTER — Ambulatory Visit (INDEPENDENT_AMBULATORY_CARE_PROVIDER_SITE_OTHER): Payer: Medicare Other

## 2020-05-13 ENCOUNTER — Ambulatory Visit (INDEPENDENT_AMBULATORY_CARE_PROVIDER_SITE_OTHER): Payer: Medicare Other | Admitting: Family Medicine

## 2020-05-13 ENCOUNTER — Other Ambulatory Visit: Payer: Self-pay

## 2020-05-13 ENCOUNTER — Encounter: Payer: Self-pay | Admitting: Family Medicine

## 2020-05-13 VITALS — BP 138/62 | HR 70 | Temp 97.8°F | Ht 65.0 in | Wt 167.4 lb

## 2020-05-13 DIAGNOSIS — M353 Polymyalgia rheumatica: Secondary | ICD-10-CM | POA: Diagnosis not present

## 2020-05-13 DIAGNOSIS — R82998 Other abnormal findings in urine: Secondary | ICD-10-CM

## 2020-05-13 DIAGNOSIS — Z0000 Encounter for general adult medical examination without abnormal findings: Secondary | ICD-10-CM

## 2020-05-13 DIAGNOSIS — E039 Hypothyroidism, unspecified: Secondary | ICD-10-CM

## 2020-05-13 DIAGNOSIS — F329 Major depressive disorder, single episode, unspecified: Secondary | ICD-10-CM | POA: Diagnosis not present

## 2020-05-13 DIAGNOSIS — E785 Hyperlipidemia, unspecified: Secondary | ICD-10-CM | POA: Diagnosis not present

## 2020-05-13 DIAGNOSIS — R5383 Other fatigue: Secondary | ICD-10-CM | POA: Diagnosis not present

## 2020-05-13 DIAGNOSIS — I10 Essential (primary) hypertension: Secondary | ICD-10-CM

## 2020-05-13 DIAGNOSIS — F32A Depression, unspecified: Secondary | ICD-10-CM

## 2020-05-13 LAB — POCT URINALYSIS DIPSTICK
Bilirubin, UA: NEGATIVE
Blood, UA: NEGATIVE
Glucose, UA: NEGATIVE
Ketones, UA: NEGATIVE
Nitrite, UA: NEGATIVE
Protein, UA: NEGATIVE
Spec Grav, UA: 1.015 (ref 1.010–1.025)
Urobilinogen, UA: 0.2 E.U./dL
pH, UA: 6 (ref 5.0–8.0)

## 2020-05-13 NOTE — Patient Instructions (Signed)
Tara Marquez , Thank you for taking time to come for your Medicare Wellness Visit. I appreciate your ongoing commitment to your health goals. Please review the following plan we discussed and let me know if I can assist you in the future.   Screening recommendations/referrals: Colonoscopy: No longer required.  Mammogram: No longer required.  Bone Density: Up to date. Previous DEXA scan was normal. No repeat needed unless advised by a physician. Recommended yearly ophthalmology/optometry visit for glaucoma screening and checkup Recommended yearly dental visit for hygiene and checkup  Vaccinations: Influenza vaccine: Up to date Pneumococcal vaccine: Completed series Tdap vaccine: Pt declines today.  Shingles vaccine: Pt declines today.     Advanced directives: Please bring a copy of your POA (Power of Attorney) and/or Living Will to your next appointment.   Conditions/risks identified: Recommend increasing water intake to 6-8 8 oz glasses a day.   Next appointment: 9:40 AM today with Dr Rosanna Randy. Declined scheduling an AWV or CPE for 2022 at this time.    Preventive Care 78 Years and Older, Female Preventive care refers to lifestyle choices and visits with your health care provider that can promote health and wellness. What does preventive care include?  A yearly physical exam. This is also called an annual well check.  Dental exams once or twice a year.  Routine eye exams. Ask your health care provider how often you should have your eyes checked.  Personal lifestyle choices, including:  Daily care of your teeth and gums.  Regular physical activity.  Eating a healthy diet.  Avoiding tobacco and drug use.  Limiting alcohol use.  Practicing safe sex.  Taking low-dose aspirin every day.  Taking vitamin and mineral supplements as recommended by your health care provider. What happens during an annual well check? The services and screenings done by your health care provider  during your annual well check will depend on your age, overall health, lifestyle risk factors, and family history of disease. Counseling  Your health care provider may ask you questions about your:  Alcohol use.  Tobacco use.  Drug use.  Emotional well-being.  Home and relationship well-being.  Sexual activity.  Eating habits.  History of falls.  Memory and ability to understand (cognition).  Work and work Statistician.  Reproductive health. Screening  You may have the following tests or measurements:  Height, weight, and BMI.  Blood pressure.  Lipid and cholesterol levels. These may be checked every 5 years, or more frequently if you are over 78 years old.  Skin check.  Lung cancer screening. You may have this screening every year starting at age 78 if you have a 30-pack-year history of smoking and currently smoke or have quit within the past 15 years.  Fecal occult blood test (FOBT) of the stool. You may have this test every year starting at age 78.  Flexible sigmoidoscopy or colonoscopy. You may have a sigmoidoscopy every 5 years or a colonoscopy every 10 years starting at age 78.  Hepatitis C blood test.  Hepatitis B blood test.  Sexually transmitted disease (STD) testing.  Diabetes screening. This is done by checking your blood sugar (glucose) after you have not eaten for a while (fasting). You may have this done every 1-3 years.  Bone density scan. This is done to screen for osteoporosis. You may have this done starting at age 78.  Mammogram. This may be done every 1-2 years. Talk to your health care provider about how often you should have regular mammograms.  Talk with your health care provider about your test results, treatment options, and if necessary, the need for more tests. Vaccines  Your health care provider may recommend certain vaccines, such as:  Influenza vaccine. This is recommended every year.  Tetanus, diphtheria, and acellular pertussis  (Tdap, Td) vaccine. You may need a Td booster every 10 years.  Zoster vaccine. You may need this after age 78.  Pneumococcal 13-valent conjugate (PCV13) vaccine. One dose is recommended after age 78.  Pneumococcal polysaccharide (PPSV23) vaccine. One dose is recommended after age 2. Talk to your health care provider about which screenings and vaccines you need and how often you need them. This information is not intended to replace advice given to you by your health care provider. Make sure you discuss any questions you have with your health care provider. Document Released: 01/09/2016 Document Revised: 09/01/2016 Document Reviewed: 10/14/2015 Elsevier Interactive Patient Education  2017 [place] Prevention in the Home Falls can cause injuries. They can happen to people of all ages. There are many things you can do to make your home safe and to help prevent falls. What can I do on the outside of my home?  Regularly fix the edges of walkways and driveways and fix any cracks.  Remove anything that might make you trip as you walk through a door, such as a raised step or threshold.  Trim any bushes or trees on the path to your home.  Use bright outdoor lighting.  Clear any walking paths of anything that might make someone trip, such as rocks or tools.  Regularly check to see if handrails are loose or broken. Make sure that both sides of any steps have handrails.  Any raised decks and porches should have guardrails on the edges.  Have any leaves, snow, or ice cleared regularly.  Use sand or salt on walking paths during winter.  Clean up any spills in your garage right away. This includes oil or grease spills. What can I do in the bathroom?  Use night lights.  Install grab bars by the toilet and in the tub and shower. Do not use towel bars as grab bars.  Use non-skid mats or decals in the tub or shower.  If you need to sit down in the shower, use a plastic, non-slip  stool.  Keep the floor dry. Clean up any water that spills on the floor as soon as it happens.  Remove soap buildup in the tub or shower regularly.  Attach bath mats securely with double-sided non-slip rug tape.  Do not have throw rugs and other things on the floor that can make you trip. What can I do in the bedroom?  Use night lights.  Make sure that you have a light by your bed that is easy to reach.  Do not use any sheets or blankets that are too big for your bed. They should not hang down onto the floor.  Have a firm chair that has side arms. You can use this for support while you get dressed.  Do not have throw rugs and other things on the floor that can make you trip. What can I do in the kitchen?  Clean up any spills right away.  Avoid walking on wet floors.  Keep items that you use a lot in easy-to-reach places.  If you need to reach something above you, use a strong step stool that has a grab bar.  Keep electrical cords out of the way.  Do not use floor polish or wax that makes floors slippery. If you must use wax, use non-skid floor wax.  Do not have throw rugs and other things on the floor that can make you trip. What can I do with my stairs?  Do not leave any items on the stairs.  Make sure that there are handrails on both sides of the stairs and use them. Fix handrails that are broken or loose. Make sure that handrails are as long as the stairways.  Check any carpeting to make sure that it is firmly attached to the stairs. Fix any carpet that is loose or worn.  Avoid having throw rugs at the top or bottom of the stairs. If you do have throw rugs, attach them to the floor with carpet tape.  Make sure that you have a light switch at the top of the stairs and the bottom of the stairs. If you do not have them, ask someone to add them for you. What else can I do to help prevent falls?  Wear shoes that:  Do not have high heels.  Have rubber bottoms.  Are  comfortable and fit you well.  Are closed at the toe. Do not wear sandals.  If you use a stepladder:  Make sure that it is fully opened. Do not climb a closed stepladder.  Make sure that both sides of the stepladder are locked into place.  Ask someone to hold it for you, if possible.  Clearly mark and make sure that you can see:  Any grab bars or handrails.  First and last steps.  Where the edge of each step is.  Use tools that help you move around (mobility aids) if they are needed. These include:  Canes.  Walkers.  Scooters.  Crutches.  Turn on the lights when you go into a dark area. Replace any light bulbs as soon as they burn out.  Set up your furniture so you have a clear path. Avoid moving your furniture around.  If any of your floors are uneven, fix them.  If there are any pets around you, be aware of where they are.  Review your medicines with your doctor. Some medicines can make you feel dizzy. This can increase your chance of falling. Ask your doctor what other things that you can do to help prevent falls. This information is not intended to replace advice given to you by your health care provider. Make sure you discuss any questions you have with your health care provider. Document Released: 10/09/2009 Document Revised: 05/20/2016 Document Reviewed: 01/17/2015 Elsevier Interactive Patient Education  2017 [employer].

## 2020-05-15 ENCOUNTER — Telehealth: Payer: Self-pay

## 2020-05-15 DIAGNOSIS — F329 Major depressive disorder, single episode, unspecified: Secondary | ICD-10-CM

## 2020-05-15 LAB — URINE CULTURE

## 2020-05-15 NOTE — Telephone Encounter (Signed)
Patient advised.

## 2020-05-15 NOTE — Telephone Encounter (Signed)
Please advise 

## 2020-05-15 NOTE — Telephone Encounter (Signed)
-----   Message from Jerrol Banana., MD sent at 05/15/2020  1:51 PM EDT ----- Urine is normal.

## 2020-05-15 NOTE — Telephone Encounter (Signed)
Copied from [place] [id number]. Topic: General - Inquiry >> May 15, 2020  8:53 AM Scherrie Gerlach wrote: Reason for CRM: pt states after her visit on 5/18, Dr Rosanna Randy was to start her on a new medication for depression.  She keeps checking the pharmacy, and nothing has been called in.  Please advise.

## 2020-05-16 DIAGNOSIS — E039 Hypothyroidism, unspecified: Secondary | ICD-10-CM | POA: Diagnosis not present

## 2020-05-16 DIAGNOSIS — E785 Hyperlipidemia, unspecified: Secondary | ICD-10-CM | POA: Diagnosis not present

## 2020-05-16 DIAGNOSIS — I10 Essential (primary) hypertension: Secondary | ICD-10-CM | POA: Diagnosis not present

## 2020-05-17 LAB — COMPREHENSIVE METABOLIC PANEL
ALT: 13 IU/L (ref 0–32)
AST: 22 IU/L (ref 0–40)
Albumin/Globulin Ratio: 2 (ref 1.2–2.2)
Albumin: 4.3 g/dL (ref 3.7–4.7)
Alkaline Phosphatase: 108 IU/L (ref 48–121)
BUN/Creatinine Ratio: 19 (ref 12–28)
BUN: 19 mg/dL (ref 8–27)
Bilirubin Total: 0.4 mg/dL (ref 0.0–1.2)
CO2: 23 mmol/L (ref 20–29)
Calcium: 9.3 mg/dL (ref 8.7–10.3)
Chloride: 103 mmol/L (ref 96–106)
Creatinine, Ser: 0.98 mg/dL (ref 0.57–1.00)
GFR calc Af Amer: 64 mL/min/{1.73_m2} (ref >59)
GFR calc non Af Amer: 55 mL/min/{1.73_m2} — ABNORMAL LOW (ref >59)
Globulin, Total: 2.1 g/dL (ref 1.5–4.5)
Glucose: 87 mg/dL (ref 65–99)
Potassium: 4.2 mmol/L (ref 3.5–5.2)
Sodium: 139 mmol/L (ref 134–144)
Total Protein: 6.4 g/dL (ref 6.0–8.5)

## 2020-05-17 LAB — CBC WITH DIFFERENTIAL/PLATELET
Basophils Absolute: 0.1 10*3/uL (ref 0.0–0.2)
Basos: 1 %
EOS (ABSOLUTE): 0.1 10*3/uL (ref 0.0–0.4)
Eos: 2 %
Hematocrit: 39.7 % (ref 34.0–46.6)
Hemoglobin: 12.6 g/dL (ref 11.1–15.9)
Immature Grans (Abs): 0 10*3/uL (ref 0.0–0.1)
Immature Granulocytes: 0 %
Lymphocytes Absolute: 1.4 10*3/uL (ref 0.7–3.1)
Lymphs: 21 %
MCH: 26.6 pg (ref 26.6–33.0)
MCHC: 31.7 g/dL (ref 31.5–35.7)
MCV: 84 fL (ref 79–97)
Monocytes Absolute: 0.4 10*3/uL (ref 0.1–0.9)
Monocytes: 7 %
Neutrophils Absolute: 4.5 10*3/uL (ref 1.4–7.0)
Neutrophils: 69 %
Platelets: 197 10*3/uL (ref 150–450)
RBC: 4.73 x10E6/uL (ref 3.77–5.28)
RDW: 13.8 % (ref 11.7–15.4)
WBC: 6.6 10*3/uL (ref 3.4–10.8)

## 2020-05-17 LAB — LIPID PANEL
Chol/HDL Ratio: 2.4 ratio (ref 0.0–4.4)
Cholesterol, Total: 136 mg/dL (ref 100–199)
HDL: 56 mg/dL (ref >39)
LDL Chol Calc (NIH): 65 mg/dL (ref 0–99)
Triglycerides: 75 mg/dL (ref 0–149)
VLDL Cholesterol Cal: 15 mg/dL (ref 5–40)

## 2020-05-17 LAB — TSH: TSH: 3.63 u[IU]/mL (ref 0.450–4.500)

## 2020-05-20 MED ORDER — BUPROPION HCL ER (XL) 150 MG PO TB24: 150.0000 mg | ORAL_TABLET | Freq: Every day | ORAL | 11 refills | Status: DC

## 2020-05-21 ENCOUNTER — Telehealth: Payer: Self-pay

## 2020-05-21 NOTE — Telephone Encounter (Signed)
Patient advised.

## 2020-05-21 NOTE — Telephone Encounter (Signed)
-----   Message from Jerrol Banana., MD sent at 05/20/2020  9:37 AM EDT ----- Labs stable and in normal range.

## 2020-06-10 NOTE — Progress Notes (Deleted)
     Established patient visit   Patient: Tara Marquez   DOB: 14-Mar-1941   79 y.o. Female  MRN: [id number] Visit Date: 06/12/2020  Today's healthcare provider: [name], MD   No chief complaint on file.  Subjective    HPI ***  {Show patient history (optional):23778::" "}   Medications: Outpatient Medications Prior to Visit  Medication Sig  . amiodarone (PACERONE) 200 MG tablet Take 1 tablet (200 mg total) by mouth 2 (two) times daily. (Patient taking differently: Take 100 mg by mouth daily. )  . atorvastatin (LIPITOR) 40 MG tablet Take 1 tablet (40 mg total) by mouth daily at 6 PM.  . buPROPion (WELLBUTRIN XL) 150 MG 24 hr tablet Take 1 tablet (150 mg total) by mouth daily.  Arna Medici 75 MCG tablet Take 1 tablet by mouth once daily  . metoprolol tartrate (LOPRESSOR) 25 MG tablet Take 1 tablet by mouth as needed.  . nitroGLYCERIN (NITROSTAT) 0.4 MG SL tablet Place 1 tablet (0.4 mg total) under the tongue every 5 (five) minutes as needed for chest pain.  . pantoprazole (PROTONIX) 40 MG tablet Take 1 tablet by mouth once daily  . rivaroxaban (XARELTO) 20 MG TABS tablet Take 1 tablet (20 mg total) by mouth daily with supper.  . sertraline (ZOLOFT) 100 MG tablet TAKE 1 & 1/2 (ONE & ONE-HALF) TABLETS BY MOUTH ONCE DAILY NEED  TO  MAKE  APPOINTMENT   No facility-administered medications prior to visit.    Review of Systems  Constitutional: Negative for appetite change, chills, fatigue and fever.  Respiratory: Negative for chest tightness and shortness of breath.   Cardiovascular: Negative for chest pain and palpitations.  Gastrointestinal: Negative for abdominal pain, nausea and vomiting.  Neurological: Negative for dizziness and weakness.    {Heme  Chem  Endocrine  Serology  Results Review (optional):23779::" "}  Objective    There were no vitals taken for this visit. {Show previous vital signs (optional):23777::" "}  Physical Exam  ***  No results found  for any visits on 06/12/20.  Assessment & Plan     ***  No follow-ups on file.      {provider attestation***:1}   [name], MD  [facility] [phone] (phone) [phone] (fax)  [place]

## 2020-06-12 ENCOUNTER — Ambulatory Visit: Payer: Self-pay | Admitting: Family Medicine

## 2020-06-25 NOTE — Progress Notes (Signed)
Established patient visit  I,[name],acting as a scribe for [name], MD.,have documented all relevant documentation on the behalf of [name], MD,as directed by  [name], MD while in the presence of [name], MD.   Patient: Tara Marquez   DOB: 1941-01-03   79 y.o. Female  MRN: [id number] Visit Date: 07/07/2020  Today's healthcare provider: [name], MD   Chief Complaint  Patient presents with  . Depression  . Follow-up   Subjective    HPI  Patient feels marginally better with her depression but feels like she is coping a bit better with day-to-day stuff.  PHQ-9 is gone from 12-11.  She is worried what will happen to her grandsons after she and her husband are gone. Depression, Follow-up  She  was last seen for this 1 months ago. Changes made at last visit include starting Wellbutrin XR 150 MG Daily.   She reports good compliance with treatment. She is not having side effects. none  She reports good tolerance of treatment. Current symptoms include: patient states she is doing well on new medication. She feels she is Improved since last visit.  Depression screen [facility] 2/9 07/07/2020 05/13/2020 11/27/2019  Decreased Interest 1 1 0  Down, Depressed, Hopeless 2 3 0  PHQ - 2 Score 3 4 0  Altered sleeping 2 2 0  Tired, decreased energy 3 3 0  Change in appetite 1 0 0  Feeling bad or failure about yourself  1 0 0  Trouble concentrating 1 2 0  Moving slowly or fidgety/restless 0 1 0  Suicidal thoughts 0 0 0  PHQ-9 Score 11 12 0  Difficult doing work/chores Somewhat difficult Somewhat difficult Not difficult at all    --------------------------------------------------------------------       Medications: Outpatient Medications Prior to Visit  Medication Sig  . amiodarone (PACERONE) 200 MG tablet Take 1 tablet (200 mg total) by mouth 2 (two) times daily. (Patient taking differently: Take 100 mg by mouth daily. )  .  atorvastatin (LIPITOR) 40 MG tablet Take 1 tablet (40 mg total) by mouth daily at 6 PM.  . buPROPion (WELLBUTRIN XL) 150 MG 24 hr tablet Take 1 tablet (150 mg total) by mouth daily.  Arna Medici 75 MCG tablet Take 1 tablet by mouth once daily  . metoprolol tartrate (LOPRESSOR) 25 MG tablet Take 1 tablet by mouth as needed.  . nitroGLYCERIN (NITROSTAT) 0.4 MG SL tablet Place 1 tablet (0.4 mg total) under the tongue every 5 (five) minutes as needed for chest pain.  . pantoprazole (PROTONIX) 40 MG tablet Take 1 tablet by mouth once daily  . rivaroxaban (XARELTO) 20 MG TABS tablet Take 1 tablet (20 mg total) by mouth daily with supper.  . sertraline (ZOLOFT) 100 MG tablet TAKE 1 & 1/2 (ONE & ONE-HALF) TABLETS BY MOUTH ONCE DAILY NEED  TO  MAKE  APPOINTMENT   No facility-administered medications prior to visit.    Review of Systems  Constitutional: Negative for appetite change, chills, fatigue and fever.  Respiratory: Negative for chest tightness and shortness of breath.   Cardiovascular: Negative for chest pain and palpitations.  Gastrointestinal: Negative for abdominal pain, nausea and vomiting.  Endocrine: Negative.   Skin: Negative.   Allergic/Immunologic: Negative.   Neurological: Negative for dizziness and weakness.  Psychiatric/Behavioral: Negative for suicidal ideas.       Objective    BP (!) 145/80 (BP Location: Right Arm, Patient Position: Sitting,  Cuff Size: Large)   Pulse 72   Temp (!) 97.5 F (36.4 C) (Other (Comment))   Resp 16   Ht 5\' 5"  (1.651 m)   Wt 166 lb (75.3 kg)   SpO2 96%   BMI 27.62 kg/m  BP Readings from Last 3 Encounters:  07/07/20 (!) 145/80  05/13/20 138/62  05/13/20 138/62   Wt Readings from Last 3 Encounters:  07/07/20 166 lb (75.3 kg)  05/13/20 167 lb 6.4 oz (75.9 kg)  05/13/20 167 lb 6.4 oz (75.9 kg)      Depression screen [facility] 2/9 07/07/2020 05/13/2020 11/27/2019  Decreased Interest 1 1 0  Down, Depressed, Hopeless 2 3 0  PHQ - 2 Score 3 4 0   Altered sleeping 2 2 0  Tired, decreased energy 3 3 0  Change in appetite 1 0 0  Feeling bad or failure about yourself  1 0 0  Trouble concentrating 1 2 0  Moving slowly or fidgety/restless 0 1 0  Suicidal thoughts 0 0 0  PHQ-9 Score 11 12 0  Difficult doing work/chores Somewhat difficult Somewhat difficult Not difficult at all    Physical Exam Vitals reviewed.  Constitutional:      Appearance: She is well-developed.  HENT:     Head: Normocephalic and atraumatic.  Eyes:     General: No scleral icterus.    Conjunctiva/sclera: Conjunctivae normal.  Cardiovascular:     Rate and Rhythm: Normal rate and regular rhythm.     Heart sounds: Normal heart sounds.  Pulmonary:     Effort: Pulmonary effort is normal.     Breath sounds: Normal breath sounds.  Abdominal:     Palpations: Abdomen is soft.     Tenderness: There is no abdominal tenderness.  Musculoskeletal:     Right lower leg: No edema.     Left lower leg: No edema.  Skin:    General: Skin is warm and dry.     Comments: Very fair skinned  Neurological:     General: No focal deficit present.     Mental Status: She is alert and oriented to person, place, and time.  Psychiatric:        Mood and Affect: Mood normal.        Behavior: Behavior normal.        Thought Content: Thought content normal.        Judgment: Judgment normal.       No results found for any visits on 07/07/20.  Assessment & Plan     1. Mild major depression (HCC) Continue bupropion and sertraline.  Consider referring for counseling.  Patient declines today.  2. Benign essential HTN Controlled on metoprolol  3. Atrial fibrillation, unspecified type ([place]) On Xarelto  4. Gastroesophageal reflux disease without esophagitis On pantoprazole  5. Adult hypothyroidism   6. Avitaminosis D    Return in 3 months (on 10/09/2020).      I, [name], MD, have reviewed all documentation for this visit. The documentation on 07/08/20 for  the exam, diagnosis, procedures, and orders are all accurate and complete.    Augustine Leverette Cranford Mon, MD  [facility] [phone] (phone) [phone] (fax)  [place]

## 2020-07-07 ENCOUNTER — Ambulatory Visit (INDEPENDENT_AMBULATORY_CARE_PROVIDER_SITE_OTHER): Payer: Medicare Other | Admitting: Family Medicine

## 2020-07-07 ENCOUNTER — Other Ambulatory Visit: Payer: Self-pay

## 2020-07-07 ENCOUNTER — Encounter: Payer: Self-pay | Admitting: Family Medicine

## 2020-07-07 VITALS — BP 145/80 | HR 72 | Temp 97.5°F | Resp 16 | Ht 65.0 in | Wt 166.0 lb

## 2020-07-07 DIAGNOSIS — I4891 Unspecified atrial fibrillation: Secondary | ICD-10-CM

## 2020-07-07 DIAGNOSIS — I10 Essential (primary) hypertension: Secondary | ICD-10-CM | POA: Diagnosis not present

## 2020-07-07 DIAGNOSIS — K219 Gastro-esophageal reflux disease without esophagitis: Secondary | ICD-10-CM | POA: Diagnosis not present

## 2020-07-07 DIAGNOSIS — F320 Major depressive disorder, single episode, mild: Secondary | ICD-10-CM

## 2020-07-07 DIAGNOSIS — E039 Hypothyroidism, unspecified: Secondary | ICD-10-CM

## 2020-07-07 DIAGNOSIS — E559 Vitamin D deficiency, unspecified: Secondary | ICD-10-CM

## 2020-07-29 ENCOUNTER — Other Ambulatory Visit: Payer: Self-pay | Admitting: Family Medicine

## 2020-07-29 NOTE — Telephone Encounter (Signed)
[facility] called and spoke to Tovey, [employer] about the refill request. She says they have refills and she will go ahead and refill.

## 2020-07-29 NOTE — Telephone Encounter (Signed)
Medication Refill - Medication:  buPROPion (WELLBUTRIN XL) 150 MG 24 hr tablet [[id number]]     Preferred Pharmacy (with phone number or street name):  [place], [place] - [place]  [address] [place] [address]  Phone: [phone] Fax: [phone]  Hours: Not open 24 hours   Pt is requesting a 90 day supply instead of 30 day supply please advise      Agent: Please be advised that RX refills may take up to 3 business days. We ask that you follow-up with your pharmacy.

## 2020-08-12 ENCOUNTER — Ambulatory Visit: Payer: Medicare Other | Admitting: Family Medicine

## 2020-08-13 DIAGNOSIS — D225 Melanocytic nevi of trunk: Secondary | ICD-10-CM | POA: Diagnosis not present

## 2020-08-13 DIAGNOSIS — Z85828 Personal history of other malignant neoplasm of skin: Secondary | ICD-10-CM | POA: Diagnosis not present

## 2020-08-13 DIAGNOSIS — D2271 Melanocytic nevi of right lower limb, including hip: Secondary | ICD-10-CM | POA: Diagnosis not present

## 2020-08-13 DIAGNOSIS — D2262 Melanocytic nevi of left upper limb, including shoulder: Secondary | ICD-10-CM | POA: Diagnosis not present

## 2020-08-13 DIAGNOSIS — D2261 Melanocytic nevi of right upper limb, including shoulder: Secondary | ICD-10-CM | POA: Diagnosis not present

## 2020-08-18 ENCOUNTER — Ambulatory Visit: Payer: Medicare Other | Attending: Internal Medicine

## 2020-08-18 DIAGNOSIS — H2640 Unspecified secondary cataract: Secondary | ICD-10-CM | POA: Diagnosis not present

## 2020-08-18 DIAGNOSIS — Z23 Encounter for immunization: Secondary | ICD-10-CM

## 2020-08-18 NOTE — Progress Notes (Signed)
   Covid-19 Vaccination Clinic  Name:  Tara Marquez    MRN: [id number] DOB: 10/07/1941  08/18/2020  [name] was observed post Covid-19 immunization for 15 minutes without incident. She was provided with Vaccine Information Sheet and instruction to access the V-Safe system.   [name] was instructed to call 911 with any severe reactions post vaccine: Marland Kitchen Difficulty breathing  . Swelling of face and throat  . A fast heartbeat  . A bad rash all over body  . Dizziness and weakness

## 2020-09-15 ENCOUNTER — Other Ambulatory Visit: Payer: Self-pay | Admitting: Family Medicine

## 2020-09-15 DIAGNOSIS — E039 Hypothyroidism, unspecified: Secondary | ICD-10-CM

## 2020-09-26 DIAGNOSIS — E782 Mixed hyperlipidemia: Secondary | ICD-10-CM | POA: Diagnosis not present

## 2020-09-26 DIAGNOSIS — I480 Paroxysmal atrial fibrillation: Secondary | ICD-10-CM | POA: Diagnosis not present

## 2020-09-26 DIAGNOSIS — I10 Essential (primary) hypertension: Secondary | ICD-10-CM | POA: Diagnosis not present

## 2020-10-09 ENCOUNTER — Other Ambulatory Visit: Payer: Self-pay

## 2020-10-09 ENCOUNTER — Ambulatory Visit (INDEPENDENT_AMBULATORY_CARE_PROVIDER_SITE_OTHER): Payer: Medicare Other | Admitting: Family Medicine

## 2020-10-09 ENCOUNTER — Encounter: Payer: Self-pay | Admitting: Family Medicine

## 2020-10-09 VITALS — BP 148/59 | HR 68 | Ht 65.0 in | Wt 167.6 lb

## 2020-10-09 DIAGNOSIS — I10 Essential (primary) hypertension: Secondary | ICD-10-CM | POA: Diagnosis not present

## 2020-10-09 DIAGNOSIS — F329 Major depressive disorder, single episode, unspecified: Secondary | ICD-10-CM | POA: Diagnosis not present

## 2020-10-09 DIAGNOSIS — Z23 Encounter for immunization: Secondary | ICD-10-CM | POA: Diagnosis not present

## 2020-10-09 DIAGNOSIS — I4891 Unspecified atrial fibrillation: Secondary | ICD-10-CM | POA: Diagnosis not present

## 2020-10-09 DIAGNOSIS — E785 Hyperlipidemia, unspecified: Secondary | ICD-10-CM

## 2020-10-09 NOTE — Progress Notes (Signed)
Established patient visit   Patient: Tara Marquez   DOB: 1941-11-08   79 y.o. Female  MRN: [id number] Visit Date: 10/09/2020  Today's healthcare provider: [name], MD   Chief Complaint  Patient presents with  . Depression  . Follow-up   Subjective    HPI  Patient overall is stable and feels a little better. Hypertension, follow-up  BP Readings from Last 3 Encounters:  10/09/20 (!) 148/59  07/07/20 (!) 145/80  05/13/20 138/62   Wt Readings from Last 3 Encounters:  10/09/20 167 lb 9.6 oz (76 kg)  07/07/20 166 lb (75.3 kg)  05/13/20 167 lb 6.4 oz (75.9 kg)     She was last seen for hypertension 3 months ago.  BP at that visit was 145/80.  Management since that visit includes; Controlled on metoprolol She reports good compliance with treatment. She is not having side effects.  She is not exercising. She is adherent to low salt diet.   Outside blood pressures are .  She does not smoke.  Use of agents associated with hypertension: none.   --------------------------------------------------------------------  Mild major depression ([place]) From 07/07/2020-Continue bupropion and sertraline.  Consider referring for counseling.  Patient declines today.       Medications: Outpatient Medications Prior to Visit  Medication Sig  . amiodarone (PACERONE) 200 MG tablet Take 1 tablet (200 mg total) by mouth 2 (two) times daily. (Patient taking differently: Take 100 mg by mouth daily. )  . atorvastatin (LIPITOR) 40 MG tablet Take 1 tablet (40 mg total) by mouth daily at 6 PM.  . buPROPion (WELLBUTRIN XL) 150 MG 24 hr tablet Take 1 tablet (150 mg total) by mouth daily.  Marland Kitchen levothyroxine (SYNTHROID) 75 MCG tablet Take 1 tablet by mouth once daily  . metoprolol tartrate (LOPRESSOR) 25 MG tablet Take 1 tablet by mouth as needed.  . nitroGLYCERIN (NITROSTAT) 0.4 MG SL tablet Place 1 tablet (0.4 mg total) under the tongue every 5 (five) minutes as needed for chest pain.    . pantoprazole (PROTONIX) 40 MG tablet Take 1 tablet by mouth once daily  . rivaroxaban (XARELTO) 20 MG TABS tablet Take 1 tablet (20 mg total) by mouth daily with supper.  . sertraline (ZOLOFT) 100 MG tablet TAKE 1 & 1/2 (ONE & ONE-HALF) TABLETS BY MOUTH ONCE DAILY NEED  TO  MAKE  APPOINTMENT   No facility-administered medications prior to visit.    Review of Systems  Constitutional: Negative for appetite change, chills, fatigue and fever.  Respiratory: Negative for chest tightness and shortness of breath.   Cardiovascular: Negative for chest pain and palpitations.  Gastrointestinal: Negative for abdominal pain, nausea and vomiting.  Neurological: Negative for dizziness and weakness.       Objective    BP (!) 148/59 (BP Location: Right Arm, Patient Position: Sitting, Cuff Size: Large)   Pulse 68   Ht 5\' 5"  (1.651 m)   Wt 167 lb 9.6 oz (76 kg)   SpO2 97%   BMI 27.89 kg/m  BP Readings from Last 3 Encounters:  10/09/20 (!) 148/59  07/07/20 (!) 145/80  05/13/20 138/62   Wt Readings from Last 3 Encounters:  10/09/20 167 lb 9.6 oz (76 kg)  07/07/20 166 lb (75.3 kg)  05/13/20 167 lb 6.4 oz (75.9 kg)      Physical Exam Vitals reviewed.  Constitutional:      Appearance: She is well-developed.  HENT:     Head: Normocephalic and atraumatic.  Eyes:  General: No scleral icterus.    Conjunctiva/sclera: Conjunctivae normal.  Cardiovascular:     Rate and Rhythm: Normal rate and regular rhythm.     Heart sounds: Normal heart sounds.  Pulmonary:     Effort: Pulmonary effort is normal.     Breath sounds: Normal breath sounds.  Abdominal:     Palpations: Abdomen is soft.     Tenderness: There is no abdominal tenderness.  Musculoskeletal:     Right lower leg: No edema.     Left lower leg: No edema.  Skin:    General: Skin is warm and dry.     Comments: Very fair skinned  Neurological:     General: No focal deficit present.     Mental Status: She is alert and oriented  to person, place, and time.  Psychiatric:        Mood and Affect: Mood normal.        Behavior: Behavior normal.        Thought Content: Thought content normal.        Judgment: Judgment normal.       No results found for any visits on 10/09/20.  Assessment & Plan     1. Need for influenza vaccination  - Flu Vaccine QUAD High Dose(Fluad)  2. Benign essential HTN Controlled  3. Atrial fibrillation, unspecified type ([place]) On Xarelto  4. Hyperlipidemia, unspecified hyperlipidemia type   5. Reactive depression On sertraline ann bupropion   No follow-ups on file.         Dorance Spink Cranford Mon, MD  [facility] [phone] (phone) [phone] (fax)  [place]

## 2020-11-28 ENCOUNTER — Encounter: Payer: Self-pay | Admitting: Adult Health

## 2020-11-28 ENCOUNTER — Ambulatory Visit (INDEPENDENT_AMBULATORY_CARE_PROVIDER_SITE_OTHER): Payer: Medicare Other | Admitting: Adult Health

## 2020-11-28 DIAGNOSIS — H6690 Otitis media, unspecified, unspecified ear: Secondary | ICD-10-CM

## 2020-11-28 DIAGNOSIS — J019 Acute sinusitis, unspecified: Secondary | ICD-10-CM | POA: Diagnosis not present

## 2020-11-28 MED ORDER — DOXYCYCLINE HYCLATE 100 MG PO TABS: 100.0000 mg | ORAL_TABLET | Freq: Two times a day (BID) | ORAL | 0 refills | Status: DC

## 2020-11-28 MED ORDER — NEOMYCIN-POLYMYXIN-HC 3.5-10000-1 OT SOLN: 3.0000 [drp] | Freq: Four times a day (QID) | OTIC | 0 refills | Status: DC

## 2020-11-28 NOTE — Progress Notes (Signed)
Virtual telephone visit    Virtual Visit via Telephone Note   This visit type was conducted due to national recommendations for restrictions regarding the COVID-19 Pandemic (e.g. social distancing) in an effort to limit this patient's exposure and mitigate transmission in our community. Due to her co-morbid illnesses, this patient is at least at moderate risk for complications without adequate follow up. This format is felt to be most appropriate for this patient at this time. The patient did not have access to video technology or had technical difficulties with video requiring transitioning to audio format only (telephone). Physical exam was limited to content and character of the telephone converstion.    Parties involved in visit as below:    Patient location: at  Home  Provider location: Provider: Provider's office at  [facility], [place].     I discussed the limitations of evaluation and management by telemedicine and the availability of in person appointments. The patient expressed understanding and agreed to proceed.   Visit Date: 11/28/2020  Today's healthcare provider: Marcille Buffy, FNP   No chief complaint on file.  Subjective    Sinus Problem This is a new problem. The current episode started 1 to 4 weeks ago. There has been no fever. Associated symptoms include congestion, coughing, ear pain (left side), sinus pressure and sneezing. Pertinent negatives include no chills, diaphoresis, headaches, hoarse voice, neck pain, shortness of breath, sore throat or swollen glands. Past treatments include nothing.    She started has ear pain left side, onset was over three weeks.   She reports she had a broken tooth, and she was on an antibiotic for it and after she had tooth pulled she felt better, but 1- 2 weeks later started with sinus symptoms, was clear nasal discharge for almost 3 weeks and now has turned green.   She has some sinus  pressure. Dental generalized pain.   Patient  denies any fever, body aches,chills, rash, chest pain, shortness of breath, nausea, vomiting, or diarrhea.    Denies dizziness, lightheadedness, pre syncopal or syncopal episodes.      Medications: Outpatient Medications Prior to Visit  Medication Sig  . amiodarone (PACERONE) 200 MG tablet Take 1 tablet (200 mg total) by mouth 2 (two) times daily. (Patient taking differently: Take 100 mg by mouth daily. )  . atorvastatin (LIPITOR) 40 MG tablet Take 1 tablet (40 mg total) by mouth daily at 6 PM.  . buPROPion (WELLBUTRIN XL) 150 MG 24 hr tablet Take 1 tablet (150 mg total) by mouth daily.  Marland Kitchen levothyroxine (SYNTHROID) 75 MCG tablet Take 1 tablet by mouth once daily  . metoprolol tartrate (LOPRESSOR) 25 MG tablet Take 1 tablet by mouth as needed.  . nitroGLYCERIN (NITROSTAT) 0.4 MG SL tablet Place 1 tablet (0.4 mg total) under the tongue every 5 (five) minutes as needed for chest pain.  . pantoprazole (PROTONIX) 40 MG tablet Take 1 tablet by mouth once daily  . rivaroxaban (XARELTO) 20 MG TABS tablet Take 1 tablet (20 mg total) by mouth daily with supper.  . sertraline (ZOLOFT) 100 MG tablet TAKE 1 & 1/2 (ONE & ONE-HALF) TABLETS BY MOUTH ONCE DAILY NEED  TO  MAKE  APPOINTMENT   No facility-administered medications prior to visit.    Review of Systems  Constitutional: Negative for chills and diaphoresis.  HENT: Positive for congestion, ear pain (left side), rhinorrhea, sinus pressure and sneezing. Negative for hoarse voice and sore throat.   Respiratory: Positive  for cough. Negative for shortness of breath.   Musculoskeletal: Negative for neck pain.  Neurological: Negative for headaches.      Objective    There were no vitals taken for this visit.   No vitals    Assessment & Plan    Acute otitis media, unspecified otitis media type  Acute non-recurrent sinusitis, unspecified location    Meds ordered this encounter  Medications   . doxycycline (VIBRA-TABS) 100 MG tablet    Sig: Take 1 tablet (100 mg total) by mouth 2 (two) times daily.    Dispense:  20 tablet    Refill:  0  . neomycin-polymyxin-hydrocortisone (CORTISPORIN) OTIC solution    Sig: Place 3 drops into the left ear 4 (four) times daily.    Dispense:  10 mL    Refill:  0    Allergies  Allergen Reactions  . Sulfa Antibiotics Hives  . Amoxicillin Rash  . Penicillins Rash   Choose Dixycyline due to beingon amiodrone and her muliple drug allergies. Azithromycin and amiodarone interaction. No follow-ups on file.    I discussed the assessment and treatment plan with the patient. The patient was provided an opportunity to ask questions and all were answered. The patient agreed with the plan and demonstrated an understanding of the instructions.   The patient was advised to call back or seek an in-person evaluation if the symptoms worsen or if the condition fails to improve as anticipated.  I provided 29 minutes of non-face-to-face time during this encounter.  Red Flags discussed. The patient was given clear instructions to go to ER or return to medical center if any red flags develop, symptoms do not improve, worsen or new problems develop. They verbalized understanding.  I discussed the limitations of evaluation and management by telemedicine and the availability of in person appointments. The patient expressed understanding and agreed to proceed.  Marcille Buffy, [place] [phone] (phone) [phone] (fax)  [place]

## 2020-11-30 DIAGNOSIS — J019 Acute sinusitis, unspecified: Secondary | ICD-10-CM | POA: Insufficient documentation

## 2020-11-30 DIAGNOSIS — H6690 Otitis media, unspecified, unspecified ear: Secondary | ICD-10-CM | POA: Insufficient documentation

## 2020-11-30 NOTE — Patient Instructions (Signed)
Sinusitis, Adult Sinusitis is soreness and swelling (inflammation) of your sinuses. Sinuses are hollow spaces in the bones around your face. They are located:  Around your eyes.  In the middle of your forehead.  Behind your nose.  In your cheekbones. Your sinuses and nasal passages are lined with a fluid called mucus. Mucus drains out of your sinuses. Swelling can trap mucus in your sinuses. This lets germs (bacteria, virus, or fungus) grow, which leads to infection. Most of the time, this condition is caused by a virus. What are the causes? This condition is caused by:  Allergies.  Asthma.  Germs.  Things that block your nose or sinuses.  Growths in the nose (nasal polyps).  Chemicals or irritants in the air.  Fungus (rare). What increases the risk? You are more likely to develop this condition if:  You have a weak body defense system (immune system).  You do a lot of swimming or diving.  You use nasal sprays too much.  You smoke. What are the signs or symptoms? The main symptoms of this condition are pain and a feeling of pressure around the sinuses. Other symptoms include:  Stuffy nose (congestion).  Runny nose (drainage).  Swelling and warmth in the sinuses.  Headache.  Toothache.  A cough that may get worse at night.  Mucus that collects in the throat or the back of the nose (postnasal drip).  Being unable to smell and taste.  Being very tired (fatigue).  A fever.  Sore throat.  Bad breath. How is this diagnosed? This condition is diagnosed based on:  Your symptoms.  Your medical history.  A physical exam.  Tests to find out if your condition is short-term (acute) or long-term (chronic). Your doctor may: ? Check your nose for growths (polyps). ? Check your sinuses using a tool that has a light (endoscope). ? Check for allergies or germs. ? Do imaging tests, such as an MRI or CT scan. How is this treated? Treatment for this condition  depends on the cause and whether it is short-term or long-term.  If caused by a virus, your symptoms should go away on their own within 10 days. You may be given medicines to relieve symptoms. They include: ? Medicines that shrink swollen tissue in the nose. ? Medicines that treat allergies (antihistamines). ? A spray that treats swelling of the nostrils. ? Rinses that help get rid of thick mucus in your nose (nasal saline washes).  If caused by bacteria, your doctor may wait to see if you will get better without treatment. You may be given antibiotic medicine if you have: ? A very bad infection. ? A weak body defense system.  If caused by growths in the nose, you may need to have surgery. Follow these instructions at home: Medicines  Take, use, or apply over-the-counter and prescription medicines only as told by your doctor. These may include nasal sprays.  If you were prescribed an antibiotic medicine, take it as told by your doctor. Do not stop taking the antibiotic even if you start to feel better. Hydrate and humidify   Drink enough water to keep your pee (urine) pale yellow.  Use a cool mist humidifier to keep the humidity level in your home above 50%.  Breathe in steam for 10-15 minutes, 3-4 times a day, or as told by your doctor. You can do this in the bathroom while a hot shower is running.  Try not to spend time in cool or dry air.   Rest  Rest as much as you can.  Sleep with your head raised (elevated).  Make sure you get enough sleep each night. General instructions   Put a warm, moist washcloth on your face 3-4 times a day, or as often as told by your doctor. This will help with discomfort.  Wash your hands often with soap and water. If there is no soap and water, use hand sanitizer.  Do not smoke. Avoid being around people who are smoking (secondhand smoke).  Keep all follow-up visits as told by your doctor. This is important. Contact a doctor if:  You  have a fever.  Your symptoms get worse.  Your symptoms do not get better within 10 days. Get help right away if:  You have a very bad headache.  You cannot stop throwing up (vomiting).  You have very bad pain or swelling around your face or eyes.  You have trouble seeing.  You feel confused.  Your neck is stiff.  You have trouble breathing. Summary  Sinusitis is swelling of your sinuses. Sinuses are hollow spaces in the bones around your face.  This condition is caused by tissues in your nose that become inflamed or swollen. This traps germs. These can lead to infection.  If you were prescribed an antibiotic medicine, take it as told by your doctor. Do not stop taking it even if you start to feel better.  Keep all follow-up visits as told by your doctor. This is important. This information is not intended to replace advice given to you by your health care provider. Make sure you discuss any questions you have with your health care provider. Document Revised: 05/15/2018 Document Reviewed: 05/15/2018 Elsevier Patient Education  [place], Adult An earache, or ear pain, can be caused by many things, including:  An infection.  Ear wax buildup.  Ear pressure.  Something in the ear that should not be there (foreign body).  A sore throat.  Tooth problems.  Jaw problems. Treatment of the earache will depend on the cause. If the cause is not clear or cannot be determined, you may need to watch your symptoms until your earache goes away or until a cause is found. Follow these instructions at home: Medicines  Take or apply over-the-counter and prescription medicines only as told by your health care provider.  If you were prescribed an antibiotic medicine, use it as told by your health care provider. Do not stop using the antibiotic even if you start to feel better.  Do not put anything in your ear other than medicine that is prescribed by your health  care provider. Managing pain If directed, apply heat to the affected area as often as told by your health care provider. Use the heat source that your health care provider recommends, such as a moist heat pack or a heating pad.  Place a towel between your skin and the heat source.  Leave the heat on for 20-30 minutes.  Remove the heat if your skin turns bright red. This is especially important if you are unable to feel pain, heat, or cold. You may have a greater risk of getting burned. If directed, put ice on the affected area as often as told by your health care provider. To do this:      Put ice in a plastic bag.  Place a towel between your skin and the bag.  Leave the ice on for 20 minutes, 2-3 times a day. General instructions  Pay  attention to any changes in your symptoms.  Try resting in an upright position instead of lying down. This may help to reduce pressure in your ear and relieve pain.  Chew gum if it helps to relieve your ear pain.  Treat any allergies as told by your health care provider.  Drink enough fluid to keep your urine pale yellow.  It is up to you to get the results of any tests that were done. Ask your health care provider, or the department that is doing the tests, when your results will be ready.  Keep all follow-up visits as told by your health care provider. This is important. Contact a health care provider if:  Your pain does not improve within 2 days.  Your earache gets worse.  You have new symptoms.  You have a fever. Get help right away if you:  Have a severe headache.  Have a stiff neck.  Have trouble swallowing.  Have redness or swelling behind your ear.  Have fluid or blood coming from your ear.  Have hearing loss.  Feel dizzy. Summary  An earache, or ear pain, can be caused by many things.  Treatment of the earache will depend on the cause. Follow recommendations from your health care provider to treat your ear pain.   If the cause is not clear or cannot be determined, you may need to watch your symptoms until your earache goes away or until a cause is found.  Keep all follow-up visits as told by your health care provider. This is important. This information is not intended to replace advice given to you by your health care provider. Make sure you discuss any questions you have with your health care provider. Document Revised: 07/21/2019 Document Reviewed: 07/21/2019 Elsevier Patient Education  [place].

## 2020-12-10 ENCOUNTER — Ambulatory Visit (INDEPENDENT_AMBULATORY_CARE_PROVIDER_SITE_OTHER): Payer: Medicare Other | Admitting: Family Medicine

## 2020-12-10 DIAGNOSIS — J301 Allergic rhinitis due to pollen: Secondary | ICD-10-CM

## 2020-12-10 NOTE — Progress Notes (Signed)
Virtual telephone visit    Virtual Visit via Telephone Note   This visit type was conducted due to national recommendations for restrictions regarding the COVID-19 Pandemic (e.g. social distancing) in an effort to limit this patient's exposure and mitigate transmission in our community. Due to her co-morbid illnesses, this patient is at least at moderate risk for complications without adequate follow up. This format is felt to be most appropriate for this patient at this time. The patient did not have access to video technology or had technical difficulties with video requiring transitioning to audio format only (telephone). Physical exam was limited to content and character of the telephone converstion.    Patient location: Home Provider location: Office  I discussed the limitations of evaluation and management by telemedicine and the availability of in person appointments. The patient expressed understanding and agreed to proceed.   Visit Date: 12/10/2020  Today's healthcare provider: [name], MD   Chief Complaint  Patient presents with  . Sinusitis   Subjective    Sinusitis This is a new problem. The problem is unchanged. There has been no fever. Associated symptoms include congestion, coughing, ear pain (Left ear pain), headaches and sinus pressure. Pertinent negatives include no chills, diaphoresis, shortness of breath or sore throat. Past treatments include antibiotics. The treatment provided mild relief.    Patient states she was treated for possible sinus infection but continues to have the symptoms of rhinorrhea and sneezing.  No pain no fever no congestion no pain in her teeth.  No significant nasal discharge.  She does have some postnasal drainage which causes her cough some during the night.     Patient Active Problem List   Diagnosis Date Noted  . Acute non-recurrent sinusitis 11/30/2020  . Acute otitis media 11/30/2020  . Benign essential HTN  10/31/2018  . Frequent PVCs 09/08/2017  . A-fib ([place]) 08/27/2015  . Clinical depression 08/27/2015  . Blood pressure elevated 08/27/2015  . Polypharmacy 08/27/2015  . Esophagitis, reflux 08/27/2015  . Fatigue 08/27/2015  . Acid reflux 08/27/2015  . HLD (hyperlipidemia) 08/27/2015  . Adult hypothyroidism 08/27/2015  . Adaptive colitis 08/27/2015  . Malaise and fatigue 08/27/2015  . Mild major depression ([place]) 08/27/2015  . Polymyalgia rheumatica ([place]) 08/27/2015  . Cranial arteritis ([place]) 08/27/2015  . Avitaminosis D 08/27/2015   Past Medical History:  Diagnosis Date  . A-fib ([place])   . Cancer ([place])    skin ca  . Depression    mild major depression  . GERD (gastroesophageal reflux disease)   . Hyperlipidemia   . Hypertension    Social History   Tobacco Use  . Smoking status: Never Smoker  . Smokeless tobacco: Never Used  Vaping Use  . Vaping Use: Never used  Substance Use Topics  . Alcohol use: No  . Drug use: No   Allergies  Allergen Reactions  . Sulfa Antibiotics Hives  . Amoxicillin Rash  . Penicillins Rash    Medications: Outpatient Medications Prior to Visit  Medication Sig  . amiodarone (PACERONE) 200 MG tablet Take 1 tablet (200 mg total) by mouth 2 (two) times daily. (Patient taking differently: Take 100 mg by mouth daily.)  . atorvastatin (LIPITOR) 40 MG tablet Take 1 tablet (40 mg total) by mouth daily at 6 PM.  . buPROPion (WELLBUTRIN XL) 150 MG 24 hr tablet Take 1 tablet (150 mg total) by mouth daily.  Marland Kitchen levothyroxine (SYNTHROID) 75 MCG tablet Take 1 tablet by mouth once daily  .  metoprolol tartrate (LOPRESSOR) 25 MG tablet Take 1 tablet by mouth as needed.  . neomycin-polymyxin-hydrocortisone (CORTISPORIN) OTIC solution Place 3 drops into the left ear 4 (four) times daily.  . nitroGLYCERIN (NITROSTAT) 0.4 MG SL tablet Place 1 tablet (0.4 mg total) under the tongue every 5 (five) minutes as needed for chest pain.  . pantoprazole (PROTONIX) 40 MG  tablet Take 1 tablet by mouth once daily  . rivaroxaban (XARELTO) 20 MG TABS tablet Take 1 tablet (20 mg total) by mouth daily with supper.  . sertraline (ZOLOFT) 100 MG tablet TAKE 1 & 1/2 (ONE & ONE-HALF) TABLETS BY MOUTH ONCE DAILY NEED  TO  MAKE  APPOINTMENT  . doxycycline (VIBRA-TABS) 100 MG tablet Take 1 tablet (100 mg total) by mouth 2 (two) times daily. (Patient not taking: Reported on 12/10/2020)   No facility-administered medications prior to visit.    Review of Systems  Constitutional: Positive for fatigue. Negative for activity change, appetite change, chills, diaphoresis, fever and unexpected weight change.  HENT: Positive for congestion, ear pain (Left ear pain), postnasal drip, rhinorrhea, sinus pressure and sinus pain. Negative for ear discharge, hearing loss, sore throat and trouble swallowing.   Eyes: Negative.   Respiratory: Positive for cough and wheezing. Negative for apnea, choking, chest tightness, shortness of breath and stridor.   Cardiovascular: Negative.   Gastrointestinal: Positive for nausea. Negative for abdominal distention, abdominal pain, anal bleeding, blood in stool, constipation, diarrhea, rectal pain and vomiting.  Neurological: Positive for headaches. Negative for dizziness and light-headedness.      Objective    There were no vitals taken for this visit.  Patient is able to answer questions and talk in full sentences without any obvious respiratory distress.   Assessment & Plan     1. Seasonal allergic rhinitis due to pollen I fully believe this is allergies and not sinus infection.  At this time will treat with Zyrtec twice a day and Flonase nasal spray.  She will follow-up if she worsens or does not improve.  We will have her take Robitussin-DM at night for the cough.  No further intervention at this time.   No follow-ups on file.    I discussed the assessment and treatment plan with the patient. The patient was provided an opportunity to  ask questions and all were answered. The patient agreed with the plan and demonstrated an understanding of the instructions.   The patient was advised to call back or seek an in-person evaluation if the symptoms worsen or if the condition fails to improve as anticipated.  I provided 10 minutes of non-face-to-face time during this encounter.    Jacquan Savas Cranford Mon, MD [facility] [phone] (phone) [phone] (fax)  [place]

## 2021-01-26 ENCOUNTER — Other Ambulatory Visit: Payer: Self-pay | Admitting: Family Medicine

## 2021-01-26 DIAGNOSIS — E039 Hypothyroidism, unspecified: Secondary | ICD-10-CM

## 2021-01-26 MED ORDER — LEVOTHYROXINE SODIUM 75 MCG PO TABS: 75.0000 ug | ORAL_TABLET | Freq: Every day | ORAL | 0 refills | Status: DC

## 2021-01-26 NOTE — Telephone Encounter (Signed)
Medication Refill - Medication: Levothyroxine 90 tablets  Has the patient contacted their pharmacy? No. (Agent: If no, request that the patient contact the pharmacy for the refill.) (Agent: If yes, when and what did the pharmacy advise?)  Preferred Pharmacy (with phone number or street name): [facility]  Agent: Please be advised that RX refills may take up to 3 business days. We ask that you follow-up with your pharmacy.

## 2021-02-06 NOTE — Progress Notes (Signed)
I,Tara Marquez,acting as a scribe for Tara Durie, MD.,have documented all relevant documentation on the behalf of Tara Durie, MD,as directed by  Tara Durie, MD while in the presence of Tara Durie, MD.   Established patient visit   Patient: Tara Marquez   DOB: 10-04-1941   80 y.o. Female  MRN: [id number] Visit Date: 02/09/2021  Today's healthcare provider: [name], MD   Chief Complaint  Patient presents with  . Follow-up  . Depression   Subjective    HPI  Patient has couple of issues today.  She complains of recent urinary frequency of some dysuria. She recently slipped on black ice and fell but is recovered nicely from this.  Even before she slipped on ice she feels like her balance is a little bit off.  It is a very gradual thing and not dramatic.  She has no dizziness, syncope, presyncope.   Hypertension, follow-up  BP Readings from Last 3 Encounters:  02/09/21 (!) 149/76  10/09/20 (!) 148/59  07/07/20 (!) 145/80   Wt Readings from Last 3 Encounters:  02/09/21 171 lb (77.6 kg)  10/09/20 167 lb 9.6 oz (76 kg)  07/07/20 166 lb (75.3 kg)     She was last seen for hypertension 4 months ago.  BP at that visit was 148/59. Management since that visit includes; controlled. She reports good compliance with treatment. She is not having side effects. none She is not exercising. She is not adherent to low salt diet.   Outside blood pressures are not checking.  She does not smoke.  Use of agents associated with hypertension: none.   --------------------------------------------------------------------  Depression, Follow-up  She  was last seen for this 4 months ago. Changes made at last visit include; On sertraline and bupropion.   She reports good compliance with treatment. She is not having side effects. none  She reports good tolerance of treatment. Current symptoms include: depressed mood and fatigue She feels she is  Improved since last visit.  Depression screen [facility] 2/9 02/09/2021 07/07/2020 05/13/2020  Decreased Interest 1 1 1   Down, Depressed, Hopeless 1 2 3   PHQ - 2 Score 2 3 4   Altered sleeping 0 2 2  Tired, decreased energy 2 3 3   Change in appetite 2 1 0  Feeling bad or failure about yourself  0 1 0  Trouble concentrating 0 1 2  Moving slowly or fidgety/restless 0 0 1  Suicidal thoughts 0 0 0  PHQ-9 Score 6 11 12   Difficult doing work/chores Not difficult at all Somewhat difficult Somewhat difficult    --------------------------------------------------------------------  Atrial fibrillation, unspecified type ([place]) From 10/09/2020-On Xarelto.        Medications: Outpatient Medications Prior to Visit  Medication Sig  . amiodarone (PACERONE) 200 MG tablet Take 1 tablet (200 mg total) by mouth 2 (two) times daily. (Patient taking differently: Take 100 mg by mouth daily.)  . atorvastatin (LIPITOR) 40 MG tablet Take 1 tablet (40 mg total) by mouth daily at 6 PM.  . buPROPion (WELLBUTRIN XL) 150 MG 24 hr tablet Take 1 tablet (150 mg total) by mouth daily.  Marland Kitchen levothyroxine (SYNTHROID) 75 MCG tablet Take 1 tablet (75 mcg total) by mouth daily.  . metoprolol tartrate (LOPRESSOR) 25 MG tablet Take 1 tablet by mouth as needed.  . nitroGLYCERIN (NITROSTAT) 0.4 MG SL tablet Place 1 tablet (0.4 mg total) under the tongue every 5 (five) minutes as needed for chest pain.  Marland Kitchen  pantoprazole (PROTONIX) 40 MG tablet Take 1 tablet by mouth once daily  . rivaroxaban (XARELTO) 20 MG TABS tablet Take 1 tablet (20 mg total) by mouth daily with supper.  . sertraline (ZOLOFT) 100 MG tablet TAKE 1 & 1/2 (ONE & ONE-HALF) TABLETS BY MOUTH ONCE DAILY NEED  TO  MAKE  APPOINTMENT  . doxycycline (VIBRA-TABS) 100 MG tablet Take 1 tablet (100 mg total) by mouth 2 (two) times daily. (Patient not taking: No sig reported)  . neomycin-polymyxin-hydrocortisone (CORTISPORIN) OTIC solution Place 3 drops into the left ear 4 (four)  times daily. (Patient not taking: Reported on 02/09/2021)   No facility-administered medications prior to visit.    Review of Systems  Constitutional: Negative for appetite change, chills, fatigue and fever.  Respiratory: Negative for chest tightness and shortness of breath.   Cardiovascular: Negative for chest pain and palpitations.  Gastrointestinal: Negative for abdominal pain, nausea and vomiting.  Neurological: Negative for dizziness and weakness.        Objective    BP (!) 149/76 (BP Location: Right Arm, Patient Position: Sitting, Cuff Size: Large)   Pulse 72   Temp 98.2 F (36.8 C) (Oral)   Resp 16   Ht 5\' 5"  (1.651 m)   Wt 171 lb (77.6 kg)   SpO2 99%   BMI 28.46 kg/m  BP Readings from Last 3 Encounters:  02/09/21 (!) 149/76  10/09/20 (!) 148/59  07/07/20 (!) 145/80   Wt Readings from Last 3 Encounters:  02/09/21 171 lb (77.6 kg)  10/09/20 167 lb 9.6 oz (76 kg)  07/07/20 166 lb (75.3 kg)       Physical Exam Vitals reviewed.  Constitutional:      Appearance: She is well-developed.  HENT:     Head: Normocephalic and atraumatic.  Eyes:     General: No scleral icterus.    Conjunctiva/sclera: Conjunctivae normal.  Cardiovascular:     Rate and Rhythm: Normal rate and regular rhythm.     Heart sounds: Normal heart sounds.  Pulmonary:     Effort: Pulmonary effort is normal.     Breath sounds: Normal breath sounds.  Abdominal:     Palpations: Abdomen is soft.     Tenderness: There is no abdominal tenderness.  Musculoskeletal:     Right lower leg: No edema.     Left lower leg: No edema.  Skin:    General: Skin is warm and dry.     Comments: Very fair skinned  Neurological:     General: No focal deficit present.     Mental Status: She is alert and oriented to person, place, and time.     Comments: Gait is normal.  Number abnormal.  Grossly nonfocal exam  Psychiatric:        Mood and Affect: Mood normal.        Behavior: Behavior normal.        Thought  Content: Thought content normal.        Judgment: Judgment normal.       No results found for any visits on 02/09/21.  Assessment & Plan     1. Benign essential HTN Good control.  Check blood pressure at home and bring in readings  2. Mild major depression (HCC) Niccoli stable.  Do PHQ-9 on next visit.  Patient states she feels better  3. Dysuria Treat as UTI with symptoms. - POCT urinalysis dipstick - CULTURE, URINE COMPREHENSIVE - nitrofurantoin, macrocrystal-monohydrate, (MACROBID) 100 MG capsule; Take 1 capsule (100 mg total)  by mouth 2 (two) times daily.  Dispense: 10 capsule; Refill: 0  4. Unsteady gait Exam today.  We discussed aging and getting rid of throw rugs in the house.    No follow-ups on file.      I, Tara Durie, MD, have reviewed all documentation for this visit. The documentation on 02/13/21 for the exam, diagnosis, procedures, and orders are all accurate and complete.    Cheyenna Pankowski Cranford Mon, MD  [facility] [phone] (phone) [phone] (fax)  [place]

## 2021-02-09 ENCOUNTER — Ambulatory Visit (INDEPENDENT_AMBULATORY_CARE_PROVIDER_SITE_OTHER): Payer: Medicare Other | Admitting: Family Medicine

## 2021-02-09 ENCOUNTER — Other Ambulatory Visit: Payer: Self-pay

## 2021-02-09 ENCOUNTER — Encounter: Payer: Self-pay | Admitting: Family Medicine

## 2021-02-09 VITALS — BP 149/76 | HR 72 | Temp 98.2°F | Resp 16 | Ht 65.0 in | Wt 171.0 lb

## 2021-02-09 DIAGNOSIS — F320 Major depressive disorder, single episode, mild: Secondary | ICD-10-CM

## 2021-02-09 DIAGNOSIS — R2681 Unsteadiness on feet: Secondary | ICD-10-CM | POA: Diagnosis not present

## 2021-02-09 DIAGNOSIS — R300 Dysuria: Secondary | ICD-10-CM

## 2021-02-09 DIAGNOSIS — I10 Essential (primary) hypertension: Secondary | ICD-10-CM

## 2021-02-09 LAB — POCT URINALYSIS DIPSTICK
Bilirubin, UA: NEGATIVE
Glucose, UA: NEGATIVE
Ketones, UA: NEGATIVE
Nitrite, UA: NEGATIVE
Protein, UA: NEGATIVE
Spec Grav, UA: 1.01 (ref 1.010–1.025)
Urobilinogen, UA: 0.2 E.U./dL
pH, UA: 6 (ref 5.0–8.0)

## 2021-02-09 MED ORDER — NITROFURANTOIN MONOHYD MACRO 100 MG PO CAPS: 100.0000 mg | ORAL_CAPSULE | Freq: Two times a day (BID) | ORAL | 0 refills | Status: DC

## 2021-02-12 LAB — CULTURE, URINE COMPREHENSIVE

## 2021-02-16 DIAGNOSIS — H43813 Vitreous degeneration, bilateral: Secondary | ICD-10-CM | POA: Diagnosis not present

## 2021-03-20 DIAGNOSIS — D485 Neoplasm of uncertain behavior of skin: Secondary | ICD-10-CM | POA: Diagnosis not present

## 2021-03-20 DIAGNOSIS — C44729 Squamous cell carcinoma of skin of left lower limb, including hip: Secondary | ICD-10-CM | POA: Diagnosis not present

## 2021-03-31 ENCOUNTER — Other Ambulatory Visit: Payer: Self-pay | Admitting: Family Medicine

## 2021-03-31 DIAGNOSIS — R9431 Abnormal electrocardiogram [ECG] [EKG]: Secondary | ICD-10-CM | POA: Insufficient documentation

## 2021-03-31 DIAGNOSIS — E782 Mixed hyperlipidemia: Secondary | ICD-10-CM | POA: Diagnosis not present

## 2021-03-31 DIAGNOSIS — I480 Paroxysmal atrial fibrillation: Secondary | ICD-10-CM | POA: Diagnosis not present

## 2021-03-31 DIAGNOSIS — R0602 Shortness of breath: Secondary | ICD-10-CM | POA: Diagnosis not present

## 2021-03-31 DIAGNOSIS — I10 Essential (primary) hypertension: Secondary | ICD-10-CM | POA: Diagnosis not present

## 2021-03-31 DIAGNOSIS — F320 Major depressive disorder, single episode, mild: Secondary | ICD-10-CM

## 2021-03-31 NOTE — Telephone Encounter (Signed)
  Notes to clinic:  this script has expired Review for continued use and refill     Requested Prescriptions  Pending Prescriptions Disp Refills   sertraline (ZOLOFT) 100 MG tablet [Pharmacy Med Name: Sertraline HCl 100 MG Oral Tablet] 135 tablet 0    Sig: TAKE 1 & 1/2 (ONE & ONE-HALF) TABLETS BY MOUTH ONCE DAILY NEED  TO  MAKE  AN  APPOINTMNET      Psychiatry:  Antidepressants - SSRI Passed - 03/31/2021  2:21 PM      Passed - Completed PHQ-2 or PHQ-9 in the last 360 days      Passed - Valid encounter within last 6 months    Recent Outpatient Visits           1 month ago Benign essential HTN   [facility] Jerrol Banana., MD   3 months ago Seasonal allergic rhinitis due to pollen   [facility] Rosanna Randy, Retia Passe., MD   4 months ago Acute otitis media, unspecified otitis media type   [facility] Flinchum, Kelby Aline, FNP   5 months ago Need for influenza vaccination   [facility] Jerrol Banana., MD   8 months ago Mild major depression [facility])   [facility] Jerrol Banana., MD       Future Appointments             In 2 months Jerrol Banana., MD [facility], [place]

## 2021-04-29 DIAGNOSIS — C44729 Squamous cell carcinoma of skin of left lower limb, including hip: Secondary | ICD-10-CM | POA: Diagnosis not present

## 2021-05-27 DIAGNOSIS — C44729 Squamous cell carcinoma of skin of left lower limb, including hip: Secondary | ICD-10-CM | POA: Diagnosis not present

## 2021-05-28 DIAGNOSIS — I480 Paroxysmal atrial fibrillation: Secondary | ICD-10-CM | POA: Diagnosis not present

## 2021-05-28 DIAGNOSIS — R0602 Shortness of breath: Secondary | ICD-10-CM | POA: Diagnosis not present

## 2021-06-09 ENCOUNTER — Encounter: Payer: Self-pay | Admitting: Family Medicine

## 2021-06-09 ENCOUNTER — Other Ambulatory Visit: Payer: Self-pay

## 2021-06-09 ENCOUNTER — Ambulatory Visit (INDEPENDENT_AMBULATORY_CARE_PROVIDER_SITE_OTHER): Payer: Medicare Other | Admitting: Family Medicine

## 2021-06-09 VITALS — BP 138/81 | HR 69 | Temp 97.2°F | Resp 18 | Wt 164.0 lb

## 2021-06-09 DIAGNOSIS — E039 Hypothyroidism, unspecified: Secondary | ICD-10-CM | POA: Diagnosis not present

## 2021-06-09 DIAGNOSIS — I10 Essential (primary) hypertension: Secondary | ICD-10-CM | POA: Diagnosis not present

## 2021-06-09 DIAGNOSIS — E785 Hyperlipidemia, unspecified: Secondary | ICD-10-CM | POA: Diagnosis not present

## 2021-06-09 DIAGNOSIS — I4891 Unspecified atrial fibrillation: Secondary | ICD-10-CM | POA: Diagnosis not present

## 2021-06-09 NOTE — Progress Notes (Signed)
Established patient visit   I,Roshena L Chambers,acting as a scribe for [name], MD.,have documented all relevant documentation on the behalf of [name], MD,as directed by  [name], MD while in the presence of [name], MD.    Patient: Tara Marquez   DOB: 1941/03/18   80 y.o. Female  MRN: [id number] Visit Date: 06/09/2021  Today's healthcare provider: [name], MD   Chief Complaint  Patient presents with   Hypertension   Depression   Subjective    HPI  Overall patient is feeling well and is taking her medications.  She has no complaints today.  Hypertension, follow-up  BP Readings from Last 3 Encounters:  06/09/21 138/81  02/09/21 (!) 149/76  10/09/20 (!) 148/59   Wt Readings from Last 3 Encounters:  06/09/21 164 lb (74.4 kg)  02/09/21 171 lb (77.6 kg)  10/09/20 167 lb 9.6 oz (76 kg)     She was last seen for hypertension 4 months ago.  BP at that visit was 149/76. Management since that visit includes continue same medication.  She reports good compliance with treatment. She is not having side effects.  She is following a Regular diet. She is exercising (yard work) She does not smoke.  Use of agents associated with hypertension: none.   Outside blood pressures are not checked. Symptoms: No chest pain No chest pressure  No palpitations No syncope  No dyspnea No orthopnea  No paroxysmal nocturnal dyspnea No lower extremity edema   Pertinent labs: Lab Results  Component Value Date   CHOL 136 05/16/2020   HDL 56 05/16/2020   LDLCALC 65 05/16/2020   TRIG 75 05/16/2020   CHOLHDL 2.4 05/16/2020   Lab Results  Component Value Date   NA 139 05/16/2020   K 4.2 05/16/2020   CREATININE 0.98 05/16/2020   GFRNONAA 55 (L) 05/16/2020   GFRAA 64 05/16/2020   GLUCOSE 87 05/16/2020     The 10-year ASCVD risk score Mikey Bussing DC Jr., et al., 2013) is: 36%    ---------------------------------------------------------------------------------------------------   Depression, Follow-up  She  was last seen for this 4 months ago. Changes made at last visit include none. Would plan to do PHQ-9 on next visit.   She reports good compliance with treatment. She  is not having side effects.   She reports good tolerance of treatment. She feels she is Unchanged since last visit.  Depression screen [facility] 2/9 06/09/2021 02/09/2021 07/07/2020  Decreased Interest 1 1 1   Down, Depressed, Hopeless 1 1 2   PHQ - 2 Score 2 2 3   Altered sleeping 0 0 2  Tired, decreased energy 2 2 3   Change in appetite 0 2 1  Feeling bad or failure about yourself  0 0 1  Trouble concentrating 0 0 1  Moving slowly or fidgety/restless 0 0 0  Suicidal thoughts 0 0 0  PHQ-9 Score 4 6 11   Difficult doing work/chores Not difficult at all Not difficult at all Somewhat difficult    -----------------------------------------------------------------------------------------      Medications: Outpatient Medications Prior to Visit  Medication Sig   amiodarone (PACERONE) 200 MG tablet Take 1 tablet (200 mg total) by mouth 2 (two) times daily. (Patient taking differently: Take 100 mg by mouth daily.)   atorvastatin (LIPITOR) 40 MG tablet Take 1 tablet (40 mg total) by mouth daily at 6 PM.   buPROPion (WELLBUTRIN XL) 150 MG 24 hr tablet Take 1 tablet (150 mg  total) by mouth daily.   levothyroxine (SYNTHROID) 75 MCG tablet Take 1 tablet (75 mcg total) by mouth daily.   metoprolol tartrate (LOPRESSOR) 25 MG tablet Take 1 tablet by mouth as needed.   nitrofurantoin, macrocrystal-monohydrate, (MACROBID) 100 MG capsule Take 1 capsule (100 mg total) by mouth 2 (two) times daily.   nitroGLYCERIN (NITROSTAT) 0.4 MG SL tablet Place 1 tablet (0.4 mg total) under the tongue every 5 (five) minutes as needed for chest pain.   pantoprazole (PROTONIX) 40 MG tablet Take 1 tablet by mouth once daily    rivaroxaban (XARELTO) 20 MG TABS tablet Take 1 tablet (20 mg total) by mouth daily with supper.   sertraline (ZOLOFT) 100 MG tablet TAKE 1 & 1/2 (ONE & ONE-HALF) TABLETS BY MOUTH ONCE DAILY NEED  TO  MAKE  AN  APPOINTMNET   [DISCONTINUED] doxycycline (VIBRA-TABS) 100 MG tablet Take 1 tablet (100 mg total) by mouth 2 (two) times daily. (Patient not taking: No sig reported)   [DISCONTINUED] neomycin-polymyxin-hydrocortisone (CORTISPORIN) OTIC solution Place 3 drops into the left ear 4 (four) times daily. (Patient not taking: No sig reported)   No facility-administered medications prior to visit.    Review of Systems  Constitutional:  Negative for appetite change, chills, fatigue and fever.  Respiratory:  Negative for chest tightness and shortness of breath.   Cardiovascular:  Positive for palpitations (unchanged since last visit). Negative for chest pain.  Gastrointestinal:  Negative for abdominal pain, nausea and vomiting.  Neurological:  Negative for dizziness and weakness.       Objective    BP 138/81 (BP Location: Left Arm)   Pulse 69   Temp (!) 97.2 F (36.2 C) (Temporal)   Resp 18   Wt 164 lb (74.4 kg)   BMI 27.29 kg/m  BP Readings from Last 3 Encounters:  06/09/21 138/81  02/09/21 (!) 149/76  10/09/20 (!) 148/59   Wt Readings from Last 3 Encounters:  06/09/21 164 lb (74.4 kg)  02/09/21 171 lb (77.6 kg)  10/09/20 167 lb 9.6 oz (76 kg)       Physical Exam Vitals reviewed.  Constitutional:      Appearance: She is well-developed.  HENT:     Head: Normocephalic and atraumatic.  Eyes:     General: No scleral icterus.    Conjunctiva/sclera: Conjunctivae normal.  Cardiovascular:     Rate and Rhythm: Normal rate and regular rhythm.     Heart sounds: Normal heart sounds.  Pulmonary:     Effort: Pulmonary effort is normal.     Breath sounds: Normal breath sounds.  Abdominal:     Palpations: Abdomen is soft.     Tenderness: There is no abdominal tenderness.   Musculoskeletal:     Right lower leg: No edema.     Left lower leg: No edema.  Skin:    General: Skin is warm and dry.     Comments: Very fair skinned  Neurological:     General: No focal deficit present.     Mental Status: She is alert and oriented to person, place, and time.     Comments: Gait is normal.  Number abnormal.  Grossly nonfocal exam  Psychiatric:        Mood and Affect: Mood normal.        Behavior: Behavior normal.        Thought Content: Thought content normal.        Judgment: Judgment normal.      No results found for  any visits on 06/09/21.  Assessment & Plan     1. Benign essential HTN Controlled on metoprolol - CBC with Differential/Platelet - Comprehensive Metabolic Panel (CMET)  2. Hyperlipidemia, unspecified hyperlipidemia type On Lipitor 40 - CBC with Differential/Platelet - Comprehensive Metabolic Panel (CMET) - Lipid panel  3. Adult hypothyroidism Check TSH - CBC with Differential/Platelet - Comprehensive Metabolic Panel (CMET) - TSH  4. Atrial fibrillation, unspecified type ([place]) On amiodarone and Xarelto in addition to metoprolol - CBC with Differential/Platelet - Comprehensive Metabolic Panel (CMET)   Return in about 6 months (around 12/09/2021).      I, [name], MD, have reviewed all documentation for this visit. The documentation on 06/12/21 for the exam, diagnosis, procedures, and orders are all accurate and complete.    Desta Bujak Cranford Mon, MD  [facility] [phone] (phone) [phone] (fax)  [place]

## 2021-06-11 ENCOUNTER — Telehealth: Payer: Self-pay

## 2021-06-11 DIAGNOSIS — E782 Mixed hyperlipidemia: Secondary | ICD-10-CM | POA: Diagnosis not present

## 2021-06-11 DIAGNOSIS — E785 Hyperlipidemia, unspecified: Secondary | ICD-10-CM | POA: Diagnosis not present

## 2021-06-11 DIAGNOSIS — I493 Ventricular premature depolarization: Secondary | ICD-10-CM | POA: Diagnosis not present

## 2021-06-11 DIAGNOSIS — I480 Paroxysmal atrial fibrillation: Secondary | ICD-10-CM | POA: Diagnosis not present

## 2021-06-11 DIAGNOSIS — I10 Essential (primary) hypertension: Secondary | ICD-10-CM | POA: Diagnosis not present

## 2021-06-11 DIAGNOSIS — I4891 Unspecified atrial fibrillation: Secondary | ICD-10-CM | POA: Diagnosis not present

## 2021-06-11 DIAGNOSIS — E039 Hypothyroidism, unspecified: Secondary | ICD-10-CM | POA: Diagnosis not present

## 2021-06-11 NOTE — Telephone Encounter (Signed)
Please advise? Patient's last on was 06/09/2021.

## 2021-06-11 NOTE — Telephone Encounter (Signed)
Patient came by asking if it was time for her to have another Cologard test done.   Looks like her last one was  02/2018 per Dana and its done every 3 years.   Can you order one for her please?

## 2021-06-12 ENCOUNTER — Other Ambulatory Visit: Payer: Self-pay | Admitting: *Deleted

## 2021-06-12 DIAGNOSIS — Z1211 Encounter for screening for malignant neoplasm of colon: Secondary | ICD-10-CM

## 2021-06-12 LAB — COMPREHENSIVE METABOLIC PANEL
ALT: 13 IU/L (ref 0–32)
AST: 17 IU/L (ref 0–40)
Albumin/Globulin Ratio: 1.8 (ref 1.2–2.2)
Albumin: 4.1 g/dL (ref 3.7–4.7)
Alkaline Phosphatase: 117 IU/L (ref 44–121)
BUN/Creatinine Ratio: 24 (ref 12–28)
BUN: 22 mg/dL (ref 8–27)
Bilirubin Total: 0.4 mg/dL (ref 0.0–1.2)
CO2: 20 mmol/L (ref 20–29)
Calcium: 9.1 mg/dL (ref 8.7–10.3)
Chloride: 103 mmol/L (ref 96–106)
Creatinine, Ser: 0.91 mg/dL (ref 0.57–1.00)
Globulin, Total: 2.3 g/dL (ref 1.5–4.5)
Glucose: 94 mg/dL (ref 65–99)
Potassium: 4.4 mmol/L (ref 3.5–5.2)
Sodium: 142 mmol/L (ref 134–144)
Total Protein: 6.4 g/dL (ref 6.0–8.5)
eGFR: 64 mL/min/{1.73_m2} (ref >59)

## 2021-06-12 LAB — CBC WITH DIFFERENTIAL/PLATELET
Basophils Absolute: 0.1 10*3/uL (ref 0.0–0.2)
Basos: 1 %
EOS (ABSOLUTE): 0.2 10*3/uL (ref 0.0–0.4)
Eos: 2 %
Hematocrit: 36.3 % (ref 34.0–46.6)
Hemoglobin: 11.5 g/dL (ref 11.1–15.9)
Immature Grans (Abs): 0 10*3/uL (ref 0.0–0.1)
Immature Granulocytes: 0 %
Lymphocytes Absolute: 1.4 10*3/uL (ref 0.7–3.1)
Lymphs: 21 %
MCH: 25.4 pg — ABNORMAL LOW (ref 26.6–33.0)
MCHC: 31.7 g/dL (ref 31.5–35.7)
MCV: 80 fL (ref 79–97)
Monocytes Absolute: 0.5 10*3/uL (ref 0.1–0.9)
Monocytes: 8 %
Neutrophils Absolute: 4.6 10*3/uL (ref 1.4–7.0)
Neutrophils: 68 %
Platelets: 179 10*3/uL (ref 150–450)
RBC: 4.52 x10E6/uL (ref 3.77–5.28)
RDW: 14.4 % (ref 11.7–15.4)
WBC: 6.8 10*3/uL (ref 3.4–10.8)

## 2021-06-12 LAB — LIPID PANEL
Chol/HDL Ratio: 2.7 ratio (ref 0.0–4.4)
Cholesterol, Total: 135 mg/dL (ref 100–199)
HDL: 50 mg/dL (ref >39)
LDL Chol Calc (NIH): 67 mg/dL (ref 0–99)
Triglycerides: 93 mg/dL (ref 0–149)
VLDL Cholesterol Cal: 18 mg/dL (ref 5–40)

## 2021-06-12 LAB — TSH: TSH: 5.69 u[IU]/mL — ABNORMAL HIGH (ref 0.450–4.500)

## 2021-06-12 NOTE — Telephone Encounter (Signed)
Cologuard was ordered. LMOVM advising patient.

## 2021-06-24 DIAGNOSIS — C44729 Squamous cell carcinoma of skin of left lower limb, including hip: Secondary | ICD-10-CM | POA: Diagnosis not present

## 2021-06-24 DIAGNOSIS — L309 Dermatitis, unspecified: Secondary | ICD-10-CM | POA: Diagnosis not present

## 2021-06-30 ENCOUNTER — Other Ambulatory Visit: Payer: Self-pay | Admitting: Family Medicine

## 2021-06-30 DIAGNOSIS — E039 Hypothyroidism, unspecified: Secondary | ICD-10-CM

## 2021-06-30 NOTE — Telephone Encounter (Signed)
   Notes to clinic:  script last filled on 01/26/2021 Review for continued use   Requested Prescriptions  Pending Prescriptions Disp Refills   EUTHYROX 75 MCG tablet [Pharmacy Med Name: Euthyrox 75 MCG Oral Tablet] 90 tablet 0    Sig: Take 1 tablet by mouth once daily      Endocrinology:  Hypothyroid Agents Failed - 06/30/2021  1:52 PM      Failed - TSH needs to be rechecked within 3 months after an abnormal result. Refill until TSH is due.      Failed - TSH in normal range and within 360 days    TSH  Date Value Ref Range Status  06/11/2021 5.690 (H) 0.450 - 4.500 uIU/mL Final          Passed - Valid encounter within last 12 months    Recent Outpatient Visits           3 weeks ago Benign essential HTN   [facility] Jerrol Banana., MD   4 months ago Benign essential HTN   [facility] Jerrol Banana., MD   6 months ago Seasonal allergic rhinitis due to pollen   [facility] Rosanna Randy, Retia Passe., MD   7 months ago Acute otitis media, unspecified otitis media type   [facility] Flinchum, Kelby Aline, FNP   8 months ago Need for influenza vaccination   [facility] Jerrol Banana., MD       Future Appointments             In 5 months Jerrol Banana., MD [facility] [place]

## 2021-07-21 ENCOUNTER — Other Ambulatory Visit: Payer: Self-pay | Admitting: Family Medicine

## 2021-07-21 DIAGNOSIS — F320 Major depressive disorder, single episode, mild: Secondary | ICD-10-CM

## 2021-08-12 DIAGNOSIS — Z85828 Personal history of other malignant neoplasm of skin: Secondary | ICD-10-CM | POA: Diagnosis not present

## 2021-08-12 DIAGNOSIS — D2262 Melanocytic nevi of left upper limb, including shoulder: Secondary | ICD-10-CM | POA: Diagnosis not present

## 2021-08-12 DIAGNOSIS — D485 Neoplasm of uncertain behavior of skin: Secondary | ICD-10-CM | POA: Diagnosis not present

## 2021-08-12 DIAGNOSIS — C44729 Squamous cell carcinoma of skin of left lower limb, including hip: Secondary | ICD-10-CM | POA: Diagnosis not present

## 2021-08-12 DIAGNOSIS — C44722 Squamous cell carcinoma of skin of right lower limb, including hip: Secondary | ICD-10-CM | POA: Diagnosis not present

## 2021-08-12 DIAGNOSIS — D2271 Melanocytic nevi of right lower limb, including hip: Secondary | ICD-10-CM | POA: Diagnosis not present

## 2021-08-12 DIAGNOSIS — L84 Corns and callosities: Secondary | ICD-10-CM | POA: Diagnosis not present

## 2021-08-12 DIAGNOSIS — D2261 Melanocytic nevi of right upper limb, including shoulder: Secondary | ICD-10-CM | POA: Diagnosis not present

## 2021-08-12 DIAGNOSIS — L821 Other seborrheic keratosis: Secondary | ICD-10-CM | POA: Diagnosis not present

## 2021-09-10 DIAGNOSIS — C44722 Squamous cell carcinoma of skin of right lower limb, including hip: Secondary | ICD-10-CM | POA: Diagnosis not present

## 2021-09-10 DIAGNOSIS — C44729 Squamous cell carcinoma of skin of left lower limb, including hip: Secondary | ICD-10-CM | POA: Diagnosis not present

## 2021-09-23 ENCOUNTER — Ambulatory Visit (INDEPENDENT_AMBULATORY_CARE_PROVIDER_SITE_OTHER): Payer: Medicare Other | Admitting: Family Medicine

## 2021-09-23 ENCOUNTER — Ambulatory Visit
Admission: RE | Admit: 2021-09-23 | Discharge: 2021-09-23 | Disposition: A | Discharge status: Home / Self Care | Payer: Medicare Other | Attending: Family Medicine | Admitting: Family Medicine

## 2021-09-23 ENCOUNTER — Ambulatory Visit
Admission: RE | Admit: 2021-09-23 | Discharge: 2021-09-23 | Disposition: A | Discharge status: Home / Self Care | Payer: Medicare Other | Source: Ambulatory Visit | Attending: Family Medicine | Admitting: Family Medicine

## 2021-09-23 ENCOUNTER — Other Ambulatory Visit: Payer: Self-pay

## 2021-09-23 DIAGNOSIS — M25511 Pain in right shoulder: Secondary | ICD-10-CM | POA: Insufficient documentation

## 2021-09-23 DIAGNOSIS — M19012 Primary osteoarthritis, left shoulder: Secondary | ICD-10-CM | POA: Diagnosis not present

## 2021-09-23 DIAGNOSIS — M25512 Pain in left shoulder: Secondary | ICD-10-CM

## 2021-09-23 DIAGNOSIS — M19011 Primary osteoarthritis, right shoulder: Secondary | ICD-10-CM | POA: Diagnosis not present

## 2021-09-23 DIAGNOSIS — R5383 Other fatigue: Secondary | ICD-10-CM | POA: Diagnosis not present

## 2021-09-23 DIAGNOSIS — M791 Myalgia, unspecified site: Secondary | ICD-10-CM | POA: Diagnosis not present

## 2021-09-23 MED ORDER — PREDNISONE 20 MG PO TABS
20.0000 mg | ORAL_TABLET | Freq: Every day | ORAL | 0 refills | Status: AC
Start: 2021-09-23 — End: 2021-09-28

## 2021-09-23 NOTE — Progress Notes (Signed)
Virtual telephone visit    Virtual Visit via Telephone Note   This visit type was conducted due to national recommendations for restrictions regarding the COVID-19 Pandemic (e.g. social distancing) in an effort to limit this patient's exposure and mitigate transmission in our community. Due to her co-morbid illnesses, this patient is at least at moderate risk for complications without adequate follow up. This format is felt to be most appropriate for this patient at this time. The patient did not have access to video technology or had technical difficulties with video requiring transitioning to audio format only (telephone). Physical exam was limited to content and character of the telephone converstion.    Patient location: Home Provider location: Office  I discussed the limitations of evaluation and management by telemedicine and the availability of in person appointments. The patient expressed understanding and agreed to proceed.   Visit Date: 09/23/2021  Today's healthcare provider: [name], MD   No chief complaint on file.  Subjective    HPI  This is a phone visit for this patient but should have an office visit.  She complains with onset of right shoulder discomfort and stiffness followed by left shoulder pain followed by low back pain.  This started fairly suddenly a couple of weeks ago.  No known trauma no falls.    Medications: Outpatient Medications Prior to Visit  Medication Sig   amiodarone (PACERONE) 200 MG tablet Take 1 tablet (200 mg total) by mouth 2 (two) times daily. (Patient taking differently: Take 100 mg by mouth daily.)   atorvastatin (LIPITOR) 40 MG tablet Take 1 tablet (40 mg total) by mouth daily at 6 PM.   buPROPion (WELLBUTRIN XL) 150 MG 24 hr tablet Take 1 tablet (150 mg total) by mouth daily.   EUTHYROX 75 MCG tablet Take 1 tablet by mouth once daily   metoprolol tartrate (LOPRESSOR) 25 MG tablet Take 1 tablet by mouth as needed.    nitrofurantoin, macrocrystal-monohydrate, (MACROBID) 100 MG capsule Take 1 capsule (100 mg total) by mouth 2 (two) times daily.   nitroGLYCERIN (NITROSTAT) 0.4 MG SL tablet Place 1 tablet (0.4 mg total) under the tongue every 5 (five) minutes as needed for chest pain.   pantoprazole (PROTONIX) 40 MG tablet Take 1 tablet by mouth once daily   rivaroxaban (XARELTO) 20 MG TABS tablet Take 1 tablet (20 mg total) by mouth daily with supper.   sertraline (ZOLOFT) 100 MG tablet TAKE 1 & 1/2 (ONE & ONE-HALF) TABLETS BY MOUTH ONCE DAILY (NEED  TO  SCHEDULE  AN  APPOINTMENT)   No facility-administered medications prior to visit.    Review of Systems     Objective    There were no vitals taken for this visit. BP Readings from Last 3 Encounters:  06/09/21 138/81  02/09/21 (!) 149/76  10/09/20 (!) 148/59   Wt Readings from Last 3 Encounters:  06/09/21 164 lb (74.4 kg)  02/09/21 171 lb (77.6 kg)  10/09/20 167 lb 9.6 oz (76 kg)        Assessment & Plan     1. Acute pain of both shoulders Consider PMR as diagnosis.  May need orthopedic referral. - predniSONE (DELTASONE) 20 MG tablet; Take 1 tablet (20 mg total) by mouth daily with breakfast for 5 days.  Dispense: 5 tablet; Refill: 0 - DG Shoulder Left; Future - DG Shoulder Right; Future  2. Myalgia Work-up for PMR - Sedimentation rate - Antinuclear Antib (ANA) - C-reactive protein - CK (Creatine  Kinase)  3. Fatigue, unspecified type  - CBC with Differential/Platelet   No follow-ups on file.    I discussed the assessment and treatment plan with the patient. The patient was provided an opportunity to ask questions and all were answered. The patient agreed with the plan and demonstrated an understanding of the instructions.   The patient was advised to call back or seek an in-person evaluation if the symptoms worsen or if the condition fails to improve as anticipated.  I provided 12 minutes of non-face-to-face time during this  encounter.  I, [name], MD, have reviewed all documentation for this visit. The documentation on 09/25/21 for the exam, diagnosis, procedures, and orders are all accurate and complete.   Ihan Pat Cranford Mon, MD [facility] [phone] (phone) [phone] (fax)  [place]

## 2021-09-24 LAB — CBC WITH DIFFERENTIAL/PLATELET
Basophils Absolute: 0.1 10*3/uL (ref 0.0–0.2)
Basos: 1 %
EOS (ABSOLUTE): 0.1 10*3/uL (ref 0.0–0.4)
Eos: 1 %
Hematocrit: 35.2 % (ref 34.0–46.6)
Hemoglobin: 11.4 g/dL (ref 11.1–15.9)
Immature Grans (Abs): 0 10*3/uL (ref 0.0–0.1)
Immature Granulocytes: 0 %
Lymphocytes Absolute: 1.3 10*3/uL (ref 0.7–3.1)
Lymphs: 20 %
MCH: 24.9 pg — ABNORMAL LOW (ref 26.6–33.0)
MCHC: 32.4 g/dL (ref 31.5–35.7)
MCV: 77 fL — ABNORMAL LOW (ref 79–97)
Monocytes Absolute: 0.4 10*3/uL (ref 0.1–0.9)
Monocytes: 6 %
Neutrophils Absolute: 4.6 10*3/uL (ref 1.4–7.0)
Neutrophils: 72 %
Platelets: 179 10*3/uL (ref 150–450)
RBC: 4.58 x10E6/uL (ref 3.77–5.28)
RDW: 15.2 % (ref 11.7–15.4)
WBC: 6.4 10*3/uL (ref 3.4–10.8)

## 2021-09-24 LAB — C-REACTIVE PROTEIN: CRP: 4 mg/L (ref 0–10)

## 2021-09-24 LAB — ANA: Anti Nuclear Antibody (ANA): NEGATIVE

## 2021-09-24 LAB — SEDIMENTATION RATE: Sed Rate: 11 mm/hr (ref 0–40)

## 2021-09-24 LAB — CK: Total CK: 53 U/L (ref 32–182)

## 2021-09-28 DIAGNOSIS — Z1211 Encounter for screening for malignant neoplasm of colon: Secondary | ICD-10-CM | POA: Diagnosis not present

## 2021-10-07 ENCOUNTER — Telehealth: Payer: Self-pay

## 2021-10-07 NOTE — Telephone Encounter (Signed)
Copied from [place] [id number]. Topic: General - Other >> Oct 06, 2021  9:53 AM Alanda Slim E wrote: Reason for CRM: Paulette from [employer] [facility] called to make sure office received fax of abnormal cologuard result/ please advise

## 2021-10-08 ENCOUNTER — Other Ambulatory Visit: Payer: Self-pay | Admitting: *Deleted

## 2021-10-08 NOTE — Telephone Encounter (Signed)
LMOVM for pt to return call. Okay for pec triage to advise patient of results.

## 2021-10-12 ENCOUNTER — Other Ambulatory Visit: Payer: Self-pay | Admitting: *Deleted

## 2021-10-12 ENCOUNTER — Telehealth: Payer: Self-pay | Admitting: *Deleted

## 2021-10-12 DIAGNOSIS — R195 Other fecal abnormalities: Secondary | ICD-10-CM

## 2021-10-12 NOTE — Telephone Encounter (Signed)
Patient with positive Cologuard results on 09/28/21.Routing to physician for any additional information/advice for patient. She denies any issues with stools/abdomen at this time.

## 2021-10-12 NOTE — Telephone Encounter (Signed)
Referral ordered

## 2021-10-22 DIAGNOSIS — C44729 Squamous cell carcinoma of skin of left lower limb, including hip: Secondary | ICD-10-CM | POA: Diagnosis not present

## 2021-10-22 DIAGNOSIS — L309 Dermatitis, unspecified: Secondary | ICD-10-CM | POA: Diagnosis not present

## 2021-10-22 DIAGNOSIS — C44722 Squamous cell carcinoma of skin of right lower limb, including hip: Secondary | ICD-10-CM | POA: Diagnosis not present

## 2021-10-29 ENCOUNTER — Ambulatory Visit: Payer: Self-pay | Admitting: *Deleted

## 2021-10-29 NOTE — Telephone Encounter (Signed)
Pt has productive cough, she is hoarse, and concerned she may have covid.  She took home test, it was negative.  She wants to see Dr Rosanna Randy. No fever.  No sore throat.   Attempted to call patient- left message for patient to call office.

## 2021-10-29 NOTE — Telephone Encounter (Signed)
Second attempt to reach patient- left message to call office °

## 2021-10-29 NOTE — Telephone Encounter (Signed)
Pt called in from VM and states that she is got a bad cough that's productive with yellow thick mucus. Symptoms started 2 days ago. She is real hoarse and has a runny nose but denies all other symptoms. She reports taking benadryl at night but that was all. Pt took home COVID test that was negative yesterday. Pt requests to Dr. Rosanna Randy in the office or virtual, willing to see other providers if Rosanna Randy doesn't have anything available.Due to no availability with Rosanna Randy or other providers in a timely manner, advised that I will sent this note over to the office and someone can give her a call with an appt, in the mean time she could do a virtual UC visit or Mychart Evisit. Pt stated she may do an Evisit if nothing was available for an appt in the office. Care advice given and pt verbalized understanding.   Reason for Disposition  Cough with cold symptoms (e.g., runny nose, postnasal drip, throat clearing)  Answer Assessment - Initial Assessment Questions 1. ONSET: "When did the cough begin?"      2 days ago  2. SEVERITY: "How bad is the cough today?"      Mild to moderate  3. SPUTUM: "Describe the color of your sputum" (none, dry cough; clear, white, yellow, green)     Yellow thick 4. HEMOPTYSIS: "Are you coughing up any blood?" If so ask: "How much?" (flecks, streaks, tablespoons, etc.)     No 5. DIFFICULTY BREATHING: "Are you having difficulty breathing?" If Yes, ask: "How bad is it?" (e.g., mild, moderate, severe)    - MILD: No SOB at rest, mild SOB with walking, speaks normally in sentences, can lie down, no retractions, pulse < 100.    - MODERATE: SOB at rest, SOB with minimal exertion and prefers to sit, cannot lie down flat, speaks in phrases, mild retractions, audible wheezing, pulse 100-120.    - SEVERE: Very SOB at rest, speaks in single words, struggling to breathe, sitting hunched forward, retractions, pulse > 120      No 6. FEVER: "Do you have a fever?" If Yes, ask: "What is your  temperature, how was it measured, and when did it start?"     No 7. CARDIAC HISTORY: "Do you have any history of heart disease?" (e.g., heart attack, congestive heart failure)      Afib 8. LUNG HISTORY: "Do you have any history of lung disease?"  (e.g., pulmonary embolus, asthma, emphysema)     No 9. PE RISK FACTORS: "Do you have a history of blood clots?" (or: recent major surgery, recent prolonged travel, bedridden)     No 10. OTHER SYMPTOMS: "Do you have any other symptoms?" (e.g., runny nose, wheezing, chest pain)       Runny nose 11. PREGNANCY: "Is there any chance you are pregnant?" "When was your last menstrual period?"       No 12. TRAVEL: "Have you traveled out of the country in the last month?" (e.g., travel history, exposures)       No  Protocols used: Cough - Acute Productive-A-AH

## 2021-10-29 NOTE — Telephone Encounter (Signed)
Returned call to patient. No answer and no vm. 

## 2021-10-30 NOTE — Telephone Encounter (Signed)
Returned call to patient. No answer and no vm. 

## 2021-11-02 ENCOUNTER — Ambulatory Visit: Payer: Self-pay | Admitting: *Deleted

## 2021-11-02 NOTE — Telephone Encounter (Signed)
Second attempt to reach patient- left message to call office °

## 2021-11-02 NOTE — Telephone Encounter (Signed)
Pt is calling because she has sx that include a bad cough. Offered pt appt on Thursday. Pt declined. Stated that someone needs to listen to her lungs. Pt states she has occasional SOB but she has Afibb. Please advise   Attempted to call patient- no answer and mailbox is full.

## 2021-11-02 NOTE — Telephone Encounter (Signed)
Attempted to reach pt. Left VM to call back.

## 2021-11-02 NOTE — Telephone Encounter (Signed)
Patient returned call . Patient was already triaged today . Patient reports she called in regarding "bad cough". Patient reports she took at home covid test and it is negative. Reviewed with patient My Chart appt is scheduled for 11/03/21. Patient verbalized understanding and will continue to take OTC medications for her symptoms .

## 2021-11-02 NOTE — Telephone Encounter (Signed)
Pt  states is calling back per message. Pt has Virtual Appt for tomorrow, secured with earlier call. Pt questioning if she should be seen in office. Reports Productive cough for yellowish phlegm. SOB with coughing. "Coughing spells, awake off and on during night." Is taking Mucinex. Denies fever.States "HArd to tell if wheezing, my husband says I am."  Pt asking if she should be seen in office, no availability. Assured pt NT would route to practice for PCPs review and final disposition. Advised UC, ED for worsening symptoms. Pt verbalizes understanding.   Please advise: call during lunch hour. [phone]     Reason for Disposition  [1] Continuous (nonstop) coughing interferes with work or school AND [2] no improvement using cough treatment per Care Advice  Answer Assessment - Initial Assessment Questions 1. ONSET: "When did the cough begin?"      LAst Tues or Wednesday 2. SEVERITY: "How bad is the cough today?"      Coughing spells. Slept off and on all night 3. SPUTUM: "Describe the color of your sputum" (none, dry cough; clear, white, yellow, green)     Yellowish 4. HEMOPTYSIS: "Are you coughing up any blood?" If so ask: "How much?" (flecks, streaks, tablespoons, etc.)     no 5. DIFFICULTY BREATHING: "Are you having difficulty breathing?" If Yes, ask: "How bad is it?" (e.g., mild, moderate, severe)    - MILD: No SOB at rest, mild SOB with walking, speaks normally in sentences, can lie down, no retractions, pulse < 100.    - MODERATE: SOB at rest, SOB with minimal exertion and prefers to sit, cannot lie down flat, speaks in phrases, mild retractions, audible wheezing, pulse 100-120.    - SEVERE: Very SOB at rest, speaks in single words, struggling to breathe, sitting hunched forward, retractions, pulse > 120      With coughing, with exertion 6. FEVER: "Do you have a fever?" If Yes, ask: "What is your temperature, how was it measured, and when did it start?"     None yesterday 7.  CARDIAC HISTORY: "Do you have any history of heart disease?" (e.g., heart attack, congestive heart failure)      *No Answer* 8. LUNG HISTORY: "Do you have any history of lung disease?"  (e.g., pulmonary embolus, asthma, emphysema)     *No Answer* 9. PE RISK FACTORS: "Do you have a history of blood clots?" (or: recent major surgery, recent prolonged travel, bedridden)     *No Answer* 10. OTHER SYMPTOMS: "Do you have any other symptoms?" (e.g., runny nose, wheezing, chest pain)       "Hard to tell, my husband says I am." Little achy.  Protocols used: Cough - Acute Productive-A-AH

## 2021-11-02 NOTE — Telephone Encounter (Signed)
Tried calling patient. Left message to call back. OK for PEC triage to advise.  ?

## 2021-11-02 NOTE — Telephone Encounter (Signed)
Either way is fine, but she she should have a Covid test done if she hasn't do so already.

## 2021-11-03 ENCOUNTER — Telehealth (INDEPENDENT_AMBULATORY_CARE_PROVIDER_SITE_OTHER): Payer: Medicare Other | Admitting: Family Medicine

## 2021-11-03 DIAGNOSIS — J069 Acute upper respiratory infection, unspecified: Secondary | ICD-10-CM

## 2021-11-03 MED ORDER — DOXYCYCLINE HYCLATE 100 MG PO TABS: 100.0000 mg | ORAL_TABLET | Freq: Two times a day (BID) | ORAL | 0 refills | Status: AC

## 2021-11-03 NOTE — Telephone Encounter (Signed)
Patient called and advised of message below from Dr. Caryn Section. She says she took a home covid test on Sunday and it was negative. She's aware of the appointment today at 1 pm.

## 2021-11-03 NOTE — Progress Notes (Signed)
MyChart Video Visit    Virtual Visit via Video Note   This visit type was conducted due to national recommendations for restrictions regarding the COVID-19 Pandemic (e.g. social distancing) in an effort to limit this patient's exposure and mitigate transmission in our community. This patient is at least at moderate risk for complications without adequate follow up. This format is felt to be most appropriate for this patient at this time. Physical exam was limited by quality of the video and audio technology used for the visit.   Patient location: home Provider location: [facility]  I discussed the limitations of evaluation and management by telemedicine and the availability of in person appointments. The patient expressed understanding and agreed to proceed.  Patient: Tara Marquez   DOB: 1941/08/12   80 y.o. Female  MRN: [id number] Visit Date: 11/03/2021  Today's healthcare provider: Lelon Huh, MD   Chief Complaint  Patient presents with   Cough    Subjective    Cough This is a new problem. The current episode started in the past 7 days. The cough is Productive of sputum. Associated symptoms include shortness of breath and wheezing. Pertinent negatives include no ear congestion, ear pain, fever, headaches, heartburn, hemoptysis, nasal congestion, postnasal drip, rhinorrhea or sore throat. She has tried OTC cough suppressant for the symptoms. The treatment provided mild relief.   -Cough for 1 week. Had negative covid test 3 days ago. Is taking OTC Mucinex. Is also using OTC Flonase for allergies Had been improving the last couple   Medications: Outpatient Medications Prior to Visit  Medication Sig   amiodarone (PACERONE) 200 MG tablet Take 1 tablet (200 mg total) by mouth 2 (two) times daily. (Patient taking differently: Take 100 mg by mouth daily.)   atorvastatin (LIPITOR) 40 MG tablet Take 1 tablet (40 mg total) by mouth daily at 6 PM.   buPROPion (WELLBUTRIN XL) 150 MG 24 hr  tablet Take 1 tablet (150 mg total) by mouth daily.   EUTHYROX 75 MCG tablet Take 1 tablet by mouth once daily   metoprolol tartrate (LOPRESSOR) 25 MG tablet Take 1 tablet by mouth as needed.   nitroGLYCERIN (NITROSTAT) 0.4 MG SL tablet Place 1 tablet (0.4 mg total) under the tongue every 5 (five) minutes as needed for chest pain.   pantoprazole (PROTONIX) 40 MG tablet Take 1 tablet by mouth once daily   rivaroxaban (XARELTO) 20 MG TABS tablet Take 1 tablet (20 mg total) by mouth daily with supper.   sertraline (ZOLOFT) 100 MG tablet TAKE 1 & 1/2 (ONE & ONE-HALF) TABLETS BY MOUTH ONCE DAILY (NEED  TO  SCHEDULE  AN  APPOINTMENT)   nitrofurantoin, macrocrystal-monohydrate, (MACROBID) 100 MG capsule Take 1 capsule (100 mg total) by mouth 2 (two) times daily. (Patient not taking: Reported on 11/03/2021)   No facility-administered medications prior to visit.    Review of Systems  Constitutional: Negative.  Negative for fever.  HENT:  Positive for congestion. Negative for ear discharge, ear pain, postnasal drip, rhinorrhea, sinus pressure, sinus pain, sore throat, tinnitus and trouble swallowing.   Eyes: Negative.   Respiratory:  Positive for cough, shortness of breath and wheezing. Negative for apnea, hemoptysis, choking, chest tightness and stridor.   Gastrointestinal: Negative.  Negative for heartburn.  Neurological:  Negative for dizziness, light-headedness and headaches.        Objective    There were no vitals taken for this visit. BP Readings from Last 3 Encounters:  06/09/21 138/81  02/09/21 Marland Kitchen)  149/76  10/09/20 (!) 148/59   Wt Readings from Last 3 Encounters:  06/09/21 164 lb (74.4 kg)  02/09/21 171 lb (77.6 kg)  10/09/20 167 lb 9.6 oz (76 kg)    Physical Exam  Awake, alert, oriented x 3. In no apparent distress    Assessment & Plan     1. Upper respiratory tract infection, unspecified type  - doxycycline (VIBRA-TABS) 100 MG tablet; Take 1 tablet (100 mg total) by mouth  2 (two) times daily for 10 days.  Dispense: 20 tablet; Refill: 0   Call if symptoms change or if not rapidly improving.         I discussed the assessment and treatment plan with the patient. The patient was provided an opportunity to ask questions and all were answered. The patient agreed with the plan and demonstrated an understanding of the instructions.   The patient was advised to call back or seek an in-person evaluation if the symptoms worsen or if the condition fails to improve as anticipated.  I provided 9 minutes of non-face-to-face time during this encounter.  The entirety of the information documented in the History of Present Illness, Review of Systems and Physical Exam were personally obtained by me. Portions of this information were initially documented by the CMA and reviewed by me for thoroughness and accuracy.    Lelon Huh, MD [facility] [phone] (phone) [phone] (fax)  [place]

## 2021-11-06 ENCOUNTER — Encounter: Payer: Self-pay | Admitting: Family Medicine

## 2021-11-10 ENCOUNTER — Other Ambulatory Visit: Payer: Self-pay | Admitting: Family Medicine

## 2021-11-10 DIAGNOSIS — F320 Major depressive disorder, single episode, mild: Secondary | ICD-10-CM

## 2021-11-24 DIAGNOSIS — Z23 Encounter for immunization: Secondary | ICD-10-CM | POA: Diagnosis not present

## 2021-11-25 ENCOUNTER — Ambulatory Visit: Payer: Medicare Other

## 2021-12-02 ENCOUNTER — Ambulatory Visit: Payer: Medicare Other

## 2021-12-02 ENCOUNTER — Telehealth: Payer: Self-pay

## 2021-12-02 NOTE — Telephone Encounter (Signed)
Copied from [place] [id number]. Topic: Appointment Scheduling - Scheduling Inquiry for Clinic >> Dec 02, 2021  8:30 AM Alanda Slim E wrote: Reason for CRM: Pt has an appt scheduled for 12.14.22 with Dr. Rosanna Randy please advise if pt will need to see another provider or reschedule

## 2021-12-04 DIAGNOSIS — Z03818 Encounter for observation for suspected exposure to other biological agents ruled out: Secondary | ICD-10-CM | POA: Diagnosis not present

## 2021-12-04 DIAGNOSIS — B9689 Other specified bacterial agents as the cause of diseases classified elsewhere: Secondary | ICD-10-CM | POA: Diagnosis not present

## 2021-12-04 DIAGNOSIS — J329 Chronic sinusitis, unspecified: Secondary | ICD-10-CM | POA: Diagnosis not present

## 2021-12-04 DIAGNOSIS — U071 COVID-19: Secondary | ICD-10-CM | POA: Diagnosis not present

## 2021-12-04 DIAGNOSIS — J40 Bronchitis, not specified as acute or chronic: Secondary | ICD-10-CM | POA: Diagnosis not present

## 2021-12-04 DIAGNOSIS — Z8616 Personal history of COVID-19: Secondary | ICD-10-CM

## 2021-12-04 HISTORY — DX: Personal history of COVID-19: Z86.16

## 2021-12-09 ENCOUNTER — Ambulatory Visit: Payer: Self-pay | Admitting: Family Medicine

## 2021-12-15 ENCOUNTER — Other Ambulatory Visit: Payer: Self-pay

## 2021-12-15 ENCOUNTER — Ambulatory Visit (INDEPENDENT_AMBULATORY_CARE_PROVIDER_SITE_OTHER): Payer: Medicare Other | Admitting: Gastroenterology

## 2021-12-15 ENCOUNTER — Encounter: Payer: Self-pay | Admitting: Gastroenterology

## 2021-12-15 VITALS — BP 154/78 | HR 65 | Temp 97.3°F | Ht 65.0 in | Wt 155.8 lb

## 2021-12-15 DIAGNOSIS — R195 Other fecal abnormalities: Secondary | ICD-10-CM

## 2021-12-15 MED ORDER — PEG 3350-KCL-NA BICARB-NACL 420 G PO SOLR: ORAL | 0 refills | Status: DC

## 2021-12-15 NOTE — Progress Notes (Signed)
Gastroenterology Consultation  Referring Provider:     Jerrol Banana.,* Primary Care Physician:  Tara Banana., Tara Marquez Primary Gastroenterologist:  Tara Marquez     Reason for Consultation:     Positive Cologuard        HPI:   Tara Marquez is a 80 y.o. y/o female referred for consultation & management of positive Cologuard by Dr. Rosanna Randy, Retia Passe., Tara Marquez. This patient comes in after having a colonoscopy by me back in 2010 and had a Cologuard test sent off with the findings being positive.  The patient's hemoglobin and hematocrit have been stable but she does have a low MCV.  I do not see any iron studies on the patient's chart.  The patient reports that she did the Cologuard test because she had a previous Cologuard test and got something in the mail stating that she needs another one.  She denies any change in bowel habits black stools bloody stools or abdominal pain.  The patient denies any unexplained weight loss.  Past Medical History:  Diagnosis Date   A-fib ([place])    Cancer ([place])    skin ca   Depression    mild major depression   GERD (gastroesophageal reflux disease)    Hyperlipidemia    Hypertension     Past Surgical History:  Procedure Laterality Date   ABDOMINAL HYSTERECTOMY  03/1971   Status Post Hysterectomy   CATARACT EXTRACTION Left    TONSILLECTOMY AND ADENOIDECTOMY  1951   WISDOM TOOTH EXTRACTION  1970    Prior to Admission medications   Medication Sig Start Date End Date Taking? Authorizing Provider  amiodarone (PACERONE) 200 MG tablet Take 1 tablet (200 mg total) by mouth 2 (two) times daily. Patient taking differently: Take 100 mg by mouth daily. 10/26/18   Vaughan Basta, Tara Marquez  atorvastatin (LIPITOR) 40 MG tablet Take 1 tablet (40 mg total) by mouth daily at 6 PM. 10/22/18   Vaughan Basta, Tara Marquez  buPROPion (WELLBUTRIN XL) 150 MG 24 hr tablet Take 1 tablet (150 mg total) by mouth daily. 05/20/20   Tara Banana., Tara Marquez   HYDROcodone-acetaminophen (NORCO/VICODIN) 5-325 MG tablet Take 1 tablet by mouth 1 day or 1 dose. 06/16/21   Provider, Historical, Tara Marquez  levothyroxine (SYNTHROID) 75 MCG tablet Take 1 tablet by mouth daily. 01/26/21   Provider, Historical, Tara Marquez  metoprolol tartrate (LOPRESSOR) 25 MG tablet Take 1 tablet by mouth as needed.    Provider, Historical, Tara Marquez  nitroGLYCERIN (NITROSTAT) 0.4 MG SL tablet Place 1 tablet (0.4 mg total) under the tongue every 5 (five) minutes as needed for chest pain. 10/20/18   Demetrios Loll, Tara Marquez  pantoprazole (PROTONIX) 40 MG tablet Take 1 tablet by mouth once daily 10/19/19   Tara Banana., Tara Marquez  rivaroxaban (XARELTO) 20 MG TABS tablet Take 1 tablet (20 mg total) by mouth daily with supper. 10/22/18   Vaughan Basta, Tara Marquez  sertraline (ZOLOFT) 100 MG tablet TAKE 1 & 1/2 (ONE & ONE-HALF) TABLETS BY MOUTH ONCE DAILY NEED  TO  SCHEDULE  AN  APPOINTMENT 11/10/21   Tara Banana., Tara Marquez    Family History  Problem Relation Age of Onset   Cancer Sister        Has had Breast Cancer and is in remission   Breast cancer Sister 46   Congenital heart disease Son      Social History   Tobacco Use   Smoking status: Never  Smokeless tobacco: Never  Vaping Use   Vaping Use: Never used  Substance Use Topics   Alcohol use: No   Drug use: No    Allergies as of 12/15/2021 - Review Complete 09/23/2021  Allergen Reaction Noted   Sulfa antibiotics Hives 08/27/2015   Amoxicillin Rash 08/27/2015   Penicillins Rash 08/27/2015    Review of Systems:    All systems reviewed and negative except where noted in HPI.   Physical Exam:  There were no vitals taken for this visit. No LMP recorded. Patient has had a hysterectomy. General:   Alert,  Well-developed, well-nourished, pleasant and cooperative in NAD Head:  Normocephalic and atraumatic. Eyes:  Sclera clear, no icterus.   Conjunctiva pink. Ears:  Normal auditory acuity. Neck:  Supple; no masses or  thyromegaly. Lungs:  Respirations even and unlabored.  Clear throughout to auscultation.   No wheezes, crackles, or rhonchi. No acute distress. Heart: Tachycardia with irregular rate; no murmurs, clicks, rubs, or gallops. Abdomen:  Normal bowel sounds.  No bruits.  Soft, non-tender and non-distended without masses, hepatosplenomegaly or hernias noted.  No guarding or rebound tenderness.  Negative Carnett sign.   Rectal:  Deferred.  Pulses:  Normal pulses noted. Extremities:  No clubbing or edema.  No cyanosis. Neurologic:  Alert and oriented x3;  grossly normal neurologically. Skin:  Intact without significant lesions or rashes.  No jaundice. Lymph Nodes:  No significant cervical adenopathy. Psych:  Alert and cooperative. Normal mood and affect.  Imaging Studies: No results found.  Assessment and Plan:   [name] is a 80 y.o. y/o female who comes in today with a positive Cologuard test with her last colonoscopy in 2010 and a previously negative Cologuard test.  The patient has been explained the possible causes of a positive Cologuard test and has been given the option to proceed with a colonoscopy.  The patient states that she would like to proceed with a colonoscopy and will be set up for a colonoscopy.  The patient will have her prescriber of her Xarelto contacted for guidance on stopping the Xarelto.  The patient has been explained the plan agrees with it.    Lucilla Lame, Tara Marquez. [name]    Note: This dictation was prepared with Dragon dictation along with smaller phrase technology. Any transcriptional errors that result from this process are unintentional.

## 2021-12-22 ENCOUNTER — Telehealth: Payer: Self-pay | Admitting: Family Medicine

## 2021-12-22 MED ORDER — LEVOTHYROXINE SODIUM 75 MCG PO TABS: 75.0000 ug | ORAL_TABLET | Freq: Every day | ORAL | 0 refills | Status: DC

## 2021-12-22 NOTE — Telephone Encounter (Signed)
[facility] faxed refill request for the following medications:   EUTHYROX 75 MCG tablet   Please advise.

## 2021-12-23 DIAGNOSIS — E782 Mixed hyperlipidemia: Secondary | ICD-10-CM | POA: Diagnosis not present

## 2021-12-23 DIAGNOSIS — I480 Paroxysmal atrial fibrillation: Secondary | ICD-10-CM | POA: Diagnosis not present

## 2021-12-23 DIAGNOSIS — Z79899 Other long term (current) drug therapy: Secondary | ICD-10-CM | POA: Diagnosis not present

## 2021-12-23 DIAGNOSIS — I10 Essential (primary) hypertension: Secondary | ICD-10-CM | POA: Diagnosis not present

## 2021-12-23 DIAGNOSIS — I493 Ventricular premature depolarization: Secondary | ICD-10-CM | POA: Diagnosis not present

## 2022-01-11 ENCOUNTER — Encounter: Payer: Self-pay | Admitting: Gastroenterology

## 2022-01-12 ENCOUNTER — Ambulatory Visit: Payer: Medicare Other | Admitting: Registered Nurse

## 2022-01-12 ENCOUNTER — Ambulatory Visit
Admission: RE | Admit: 2022-01-12 | Discharge: 2022-01-12 | Disposition: A | Discharge status: Home / Self Care | Payer: Medicare Other | Attending: Gastroenterology | Admitting: Gastroenterology

## 2022-01-12 ENCOUNTER — Encounter
Admission: RE | Disposition: A | Discharge status: Home / Self Care | Payer: Self-pay | Source: Home / Self Care | Attending: Gastroenterology

## 2022-01-12 ENCOUNTER — Encounter: Payer: Self-pay | Admitting: Gastroenterology

## 2022-01-12 DIAGNOSIS — Z86010 Personal history of colonic polyps: Secondary | ICD-10-CM | POA: Insufficient documentation

## 2022-01-12 DIAGNOSIS — R195 Other fecal abnormalities: Secondary | ICD-10-CM | POA: Diagnosis not present

## 2022-01-12 DIAGNOSIS — Z1211 Encounter for screening for malignant neoplasm of colon: Secondary | ICD-10-CM | POA: Diagnosis not present

## 2022-01-12 DIAGNOSIS — K641 Second degree hemorrhoids: Secondary | ICD-10-CM | POA: Diagnosis not present

## 2022-01-12 HISTORY — DX: Hypothyroidism, unspecified: E03.9

## 2022-01-12 HISTORY — PX: COLONOSCOPY WITH PROPOFOL: SHX5780

## 2022-01-12 SURGERY — COLONOSCOPY WITH PROPOFOL
Anesthesia: General

## 2022-01-12 MED ORDER — SODIUM CHLORIDE 0.9 % IV SOLN
INTRAVENOUS | Status: DC
  Administered 2022-01-12: 1000 mL via INTRAVENOUS

## 2022-01-12 MED ORDER — PROPOFOL 10 MG/ML IV BOLUS
INTRAVENOUS | Status: DC | PRN
  Administered 2022-01-12: 30 mg via INTRAVENOUS
  Administered 2022-01-12: 70 mg via INTRAVENOUS

## 2022-01-12 MED ORDER — PROPOFOL 500 MG/50ML IV EMUL
INTRAVENOUS | Status: DC | PRN
Start: 2022-01-12 — End: 2022-01-12
  Administered 2022-01-12: 140 ug/kg/min via INTRAVENOUS

## 2022-01-12 MED ORDER — LIDOCAINE HCL (CARDIAC) PF 100 MG/5ML IV SOSY
PREFILLED_SYRINGE | INTRAVENOUS | Status: DC | PRN
  Administered 2022-01-12: 60 mg via INTRAVENOUS

## 2022-01-12 MED ORDER — PROPOFOL 500 MG/50ML IV EMUL
INTRAVENOUS | Status: AC
  Filled 2022-01-12: qty 100

## 2022-01-12 NOTE — Transfer of Care (Signed)
Immediate Anesthesia Transfer of Care Note  Patient: Tara Marquez  Procedure(s) Performed: COLONOSCOPY WITH PROPOFOL  Patient Location: PACU  Anesthesia Type:General  Level of Consciousness: awake, alert  and oriented  Airway & Oxygen Therapy: Patient Spontanous Breathing  Post-op Assessment: Report given to RN and Post -op Vital signs reviewed and stable  Post vital signs: Reviewed and stable  Last Vitals:  Vitals Value Taken Time  BP 95/51 01/12/22 0903  Temp 36.1 C 01/12/22 0903  Pulse 82 01/12/22 0903  Resp 18 01/12/22 0903  SpO2 96 % 01/12/22 0903    Last Pain:  Vitals:   01/12/22 0903  TempSrc:   PainSc: Asleep         Complications: No notable events documented.

## 2022-01-12 NOTE — Anesthesia Preprocedure Evaluation (Signed)
Anesthesia Evaluation  Patient identified by MRN, date of birth, ID band Patient awake    Reviewed: Allergy & Precautions, H&P , NPO status , Patient's Chart, lab work & pertinent test results, reviewed documented beta blocker date and time   History of Anesthesia Complications Negative for: history of anesthetic complications  Airway Mallampati: II  TM Distance: >3 FB Neck ROM: full    Dental  (+) Dental Advidsory Given, Caps, Teeth Intact, Missing   Pulmonary neg pulmonary ROS,    Pulmonary exam normal breath sounds clear to auscultation       Cardiovascular Exercise Tolerance: Good hypertension, (-) angina(-) Past MI and (-) Cardiac Stents + dysrhythmias Atrial Fibrillation (-) Valvular Problems/Murmurs Rhythm:regular Rate:Normal     Neuro/Psych PSYCHIATRIC DISORDERS Depression negative neurological ROS     GI/Hepatic Neg liver ROS, GERD  Controlled,  Endo/Other  neg diabetesHypothyroidism   Renal/GU negative Renal ROS  negative genitourinary   Musculoskeletal   Abdominal   Peds  Hematology negative hematology ROS (+)   Anesthesia Other Findings Past Medical History: No date: A-fib ([facility]) No date: Cancer ([place])     Comment:  skin ca No date: Depression     Comment:  mild major depression No date: GERD (gastroesophageal reflux disease) No date: Hyperlipidemia No date: Hypertension No date: Hypothyroidism   Reproductive/Obstetrics negative OB ROS                             Anesthesia Physical Anesthesia Plan  ASA: 2  Anesthesia Plan: General   Post-op Pain Management:    Induction: Intravenous  PONV Risk Score and Plan: 3 and Propofol infusion and TIVA  Airway Management Planned: Natural Airway and Nasal Cannula  Additional Equipment:   Intra-op Plan:   Post-operative Plan:   Informed Consent: I have reviewed the patients History and Physical, chart, labs and  discussed the procedure including the risks, benefits and alternatives for the proposed anesthesia with the patient or authorized representative who has indicated his/her understanding and acceptance.     Dental Advisory Given  Plan Discussed with: Anesthesiologist, CRNA and Surgeon  Anesthesia Plan Comments:         Anesthesia Quick Evaluation

## 2022-01-12 NOTE — Op Note (Signed)
[facility] Gastroenterology Patient Name: Tara Marquez Procedure Date: 01/12/2022 8:42 AM MRN: [id number] Account #: [id number] Date of Birth: 07/01/1941 Admit Type: Outpatient Age: 81 Room: [facility] ENDO ROOM 4 Gender: Female Note Status: Finalized Instrument Name: Park Meo [id number] Procedure:             Colonoscopy Indications:           Positive Cologuard test Providers:             Lucilla Lame MD, MD Referring MD:          Janine Ores. Rosanna Randy, MD (Referring MD) Medicines:             Propofol per Anesthesia Complications:         No immediate complications. Procedure:             Pre-Anesthesia Assessment:                        - Prior to the procedure, a History and Physical was                         performed, and patient medications and allergies were                         reviewed. The patient's tolerance of previous                         anesthesia was also reviewed. The risks and benefits                         of the procedure and the sedation options and risks                         were discussed with the patient. All questions were                         answered, and informed consent was obtained. Prior                         Anticoagulants: The patient has taken no previous                         anticoagulant or antiplatelet agents. ASA Grade                         Assessment: II - A patient with mild systemic disease.                         After reviewing the risks and benefits, the patient                         was deemed in satisfactory condition to undergo the                         procedure.                        After obtaining informed consent, the colonoscope was  passed under direct vision. Throughout the procedure,                         the patient's blood pressure, pulse, and oxygen                         saturations were monitored continuously. The                         Colonoscope was  introduced through the anus and                         advanced to the the cecum, identified by appendiceal                         orifice and ileocecal valve. The colonoscopy was                         performed without difficulty. The patient tolerated                         the procedure well. The quality of the bowel                         preparation was excellent. Findings:      The perianal and digital rectal examinations were normal.      Non-bleeding internal hemorrhoids were found during retroflexion. The       hemorrhoids were Grade II (internal hemorrhoids that prolapse but reduce       spontaneously). Impression:            - Non-bleeding internal hemorrhoids.                        - No specimens collected. Recommendation:        - Discharge patient to home.                        - Resume previous diet.                        - Continue present medications.                        - Repeat colonoscopy is not recommended for                         surveillance. Procedure Code(s):     --- Professional ---                        [phone], Colonoscopy, flexible; diagnostic, including                         collection of specimen(s) by brushing or washing, when                         performed (separate procedure) Diagnosis Code(s):     --- Professional ---                        R19.5, Other fecal abnormalities CPT copyright  2019 American Medical Association. All rights reserved. The codes documented in this report are preliminary and upon coder review may  be revised to meet current compliance requirements. Lucilla Lame MD, MD 01/12/2022 9:03:08 AM This report has been signed electronically. Number of Addenda: 0 Note Initiated On: 01/12/2022 8:42 AM Scope Withdrawal Time: 0 hours 5 minutes 32 seconds  Total Procedure Duration: 0 hours 12 minutes 52 seconds  Estimated Blood Loss:  Estimated blood loss: none.      [facility]

## 2022-01-12 NOTE — H&P (Signed)
Lucilla Lame, MD [address] [place], [place] [address] Phone:[phone] Fax : [phone]  Primary Care Physician:  Jerrol Banana., MD Primary Gastroenterologist:  Dr. Allen Norris  Pre-Procedure History & Physical: HPI:  Tara Marquez is a 81 y.o. female is here for an colonoscopy.   Past Medical History:  Diagnosis Date   A-fib ([place])    Cancer ([place])    skin ca   Depression    mild major depression   GERD (gastroesophageal reflux disease)    Hyperlipidemia    Hypertension    Hypothyroidism     Past Surgical History:  Procedure Laterality Date   ABDOMINAL HYSTERECTOMY  03/1971   Status Post Hysterectomy   CATARACT EXTRACTION Left    COLONOSCOPY     2010   EYE SURGERY     TONSILLECTOMY     TONSILLECTOMY AND ADENOIDECTOMY  1951   WISDOM TOOTH EXTRACTION  1970    Prior to Admission medications   Medication Sig Start Date End Date Taking? Authorizing Provider  amiodarone (PACERONE) 200 MG tablet Take 1 tablet (200 mg total) by mouth 2 (two) times daily. Patient taking differently: Take 100 mg by mouth daily. 10/26/18  Yes Vaughan Basta, MD  atorvastatin (LIPITOR) 40 MG tablet Take 1 tablet (40 mg total) by mouth daily at 6 PM. 10/22/18  Yes Vaughan Basta, MD  levothyroxine (EUTHYROX) 75 MCG tablet Take 1 tablet (75 mcg total) by mouth daily before breakfast. Please schedule office visit before any future refills 12/22/21  Yes Birdie Sons, MD  levothyroxine (SYNTHROID) 75 MCG tablet Take 1 tablet by mouth daily. 01/26/21  Yes [provider]  polyethylene glycol-electrolytes (NULYTELY) 420 g solution Prepare according to package instructions. Starting at 5:00 PM: Drink one 8 oz glass of mixture every 15 minutes until you finish half of the jug. Five hours prior to procedure, drink 8 oz glass of mixture every 15 minutes until it is all gone. Make sure you do not drink anything 4 hours prior to your procedure. 12/15/21  Yes  Lucilla Lame, MD  sertraline (ZOLOFT) 100 MG tablet TAKE 1 & 1/2 (ONE & ONE-HALF) TABLETS BY MOUTH ONCE DAILY NEED  TO  SCHEDULE  AN  APPOINTMENT 11/10/21  Yes Jerrol Banana., MD  buPROPion (WELLBUTRIN XL) 150 MG 24 hr tablet Take 1 tablet (150 mg total) by mouth daily. Patient not taking: Reported on 01/12/2022 05/20/20   Jerrol Banana., MD  HYDROcodone-acetaminophen (NORCO/VICODIN) 5-325 MG tablet Take 1 tablet by mouth 1 day or 1 dose. Patient not taking: Reported on 01/12/2022 06/16/21   [provider]  metoprolol tartrate (LOPRESSOR) 25 MG tablet Take 1 tablet by mouth as needed.    [provider]  nitroGLYCERIN (NITROSTAT) 0.4 MG SL tablet Place 1 tablet (0.4 mg total) under the tongue every 5 (five) minutes as needed for chest pain. 10/20/18   Demetrios Loll, MD  pantoprazole (PROTONIX) 40 MG tablet Take 1 tablet by mouth once daily 10/19/19   Jerrol Banana., MD  rivaroxaban (XARELTO) 20 MG TABS tablet Take 1 tablet (20 mg total) by mouth daily with supper. 10/22/18   Vaughan Basta, MD    Allergies as of 12/15/2021 - Review Complete 12/15/2021  Allergen Reaction Noted   Sulfa antibiotics Hives 08/27/2015   Amoxicillin Rash 08/27/2015   Penicillins Rash 08/27/2015    Family History  Problem Relation Age of Onset   Cancer Sister  Has had Breast Cancer and is in remission   Breast cancer Sister 65   Congenital heart disease Son     Social History   Socioeconomic History   Marital status: Married    Spouse name: Not on file   Number of children: 2   Years of education: Not on file   Highest education level: Some college, no degree  Occupational History   Occupation: retired  Tobacco Use   Smoking status: Never   Smokeless tobacco: Never  Vaping Use   Vaping Use: Never used  Substance and Sexual Activity   Alcohol use: No   Drug use: No   Sexual activity: Yes  Other Topics Concern   Not on file  Social History  Narrative   Not on file   Social Determinants of Health   Financial Resource Strain: Not on file  Food Insecurity: Not on file  Transportation Needs: Not on file  Physical Activity: Not on file  Stress: Not on file  Social Connections: Not on file  Intimate Partner Violence: Not on file    Review of Systems: See HPI, otherwise negative ROS  Physical Exam: BP 131/86    Pulse (!) 101    Temp (!) 96.4 F (35.8 C) (Temporal)    Resp 16    Ht 5\' 5"  (1.651 m)    Wt 73.6 kg    SpO2 98%    BMI 27.01 kg/m  General:   Alert,  pleasant and cooperative in NAD Head:  Normocephalic and atraumatic. Neck:  Supple; no masses or thyromegaly. Lungs:  Clear throughout to auscultation.    Heart:  Regular rate and rhythm. Abdomen:  Soft, nontender and nondistended. Normal bowel sounds, without guarding, and without rebound.   Neurologic:  Alert and  oriented x4;  grossly normal neurologically.  Impression/Plan: Tara Marquez is here for an colonoscopy to be performed for a history of adenomatous polyps on positive cologaurd  Risks, benefits, limitations, and alternatives regarding  colonoscopy have been reviewed with the patient.  Questions have been answered.  All parties agreeable.   Lucilla Lame, MD  01/12/2022, 8:27 AM

## 2022-01-13 ENCOUNTER — Encounter: Payer: Self-pay | Admitting: Gastroenterology

## 2022-01-18 NOTE — Anesthesia Postprocedure Evaluation (Signed)
Anesthesia Post Note  Patient: Tara Marquez  Procedure(s) Performed: COLONOSCOPY WITH PROPOFOL  Patient location during evaluation: Endoscopy Anesthesia Type: General Level of consciousness: awake and alert Pain management: pain level controlled Vital Signs Assessment: post-procedure vital signs reviewed and stable Respiratory status: spontaneous breathing, nonlabored ventilation, respiratory function stable and patient connected to nasal cannula oxygen Cardiovascular status: blood pressure returned to baseline and stable Postop Assessment: no apparent nausea or vomiting Anesthetic complications: no   No notable events documented.   Last Vitals:  Vitals:   01/12/22 0913 01/12/22 0923  BP: (!) 92/57 106/70  Pulse: 82 78  Resp: (!) 26 (!) 21  Temp:    SpO2: 96% 95%    Last Pain:  Vitals:   01/12/22 0923  TempSrc:   PainSc: 0-No pain                 Martha Clan

## 2022-01-26 ENCOUNTER — Telehealth: Payer: Self-pay | Admitting: Family Medicine

## 2022-01-26 NOTE — Telephone Encounter (Signed)
Copied from [place] [phone]. Topic: Medicare AWV >> Jan 26, 2022  2:55 PM Cher Nakai R wrote: Reason for CRM:  Left message for patient to call back and schedule Medicare Annual Wellness Visit (AWV) in office.   If not able to come in office, please offer to do virtually or by telephone.   Last AWV: 05/13/2020  Please schedule at anytime with [facility] Health Advisor.  If any questions, please contact me at [phone]

## 2022-02-24 DIAGNOSIS — H3589 Other specified retinal disorders: Secondary | ICD-10-CM | POA: Diagnosis not present

## 2022-04-14 ENCOUNTER — Telehealth: Payer: Self-pay | Admitting: Family Medicine

## 2022-04-14 NOTE — Telephone Encounter (Signed)
Copied from [place] [id number]. Topic: Medicare AWV ?>> Apr 14, 2022 11:43 AM Cher Nakai R wrote: ?Reason for CRM:  ?Left message for patient to call back and schedule Medicare Annual Wellness Visit (AWV) in office.  ? ?If unable to come into the office for AWV,  please offer to do virtually or by telephone. ? ?Last AWV: 05/13/2020 ? ?Please schedule at anytime with [facility] Health Advisor. ? ?30 minute appointment for Virtual or phone ?45 minute appointment for in office or Initial virtual/phone ? ?Any questions, please contact me at [phone] ?

## 2022-04-21 ENCOUNTER — Ambulatory Visit (INDEPENDENT_AMBULATORY_CARE_PROVIDER_SITE_OTHER): Payer: Medicare Other

## 2022-04-21 VITALS — Wt 162.0 lb

## 2022-04-21 DIAGNOSIS — Z0000 Encounter for general adult medical examination without abnormal findings: Secondary | ICD-10-CM | POA: Diagnosis not present

## 2022-04-21 NOTE — Progress Notes (Signed)
Virtual Visit via Telephone Note  I connected with  Tara Marquez on 04/21/22 at  1:00 PM EDT by telephone and verified that I am speaking with the correct person using two identifiers.  Location: Patient: home Provider: [facility] Persons participating in the virtual visit: [place]   I discussed the limitations, risks, security and privacy concerns of performing an evaluation and management service by telephone and the availability of in person appointments. The patient expressed understanding and agreed to proceed.  Interactive audio and video telecommunications were attempted between this nurse and patient, however failed, due to patient having technical difficulties OR patient did not have access to video capability.  We continued and completed visit with audio only.  Some vital signs may be absent or patient reported.   Tara David, LPN  Subjective:   Tara Marquez is a 81 y.o. female who presents for Medicare Annual (Subsequent) preventive examination.  Review of Systems           Objective:    There were no vitals filed for this visit. There is no height or weight on file to calculate BMI.     01/12/2022    8:16 AM 05/13/2020    9:16 AM 10/30/2018   12:02 PM 10/20/2018   12:42 PM 10/20/2018    2:04 AM 02/21/2018   10:18 AM 12/09/2016   10:51 AM  Advanced Directives  Does Patient Have a Medical Advance Directive? Yes Yes Yes Yes Yes Yes No  Type of Corporate treasurer of [place];Living will [place];Living will [place];Living will Healthcare Power of [place];Living will   Does patient want to make changes to medical advance directive?    No - Patient declined No - Patient declined    Copy of [place] in Chart?  No - copy requested No - copy requested No - copy requested No - copy requested No - copy requested     Current Medications  (verified) Outpatient Encounter Medications as of 04/21/2022  Medication Sig   atorvastatin (LIPITOR) 40 MG tablet Take by mouth.   rivaroxaban (XARELTO) 20 MG TABS tablet Take by mouth.   amiodarone (PACERONE) 200 MG tablet Take 1 tablet (200 mg total) by mouth 2 (two) times daily. (Patient taking differently: Take 100 mg by mouth daily.)   atorvastatin (LIPITOR) 40 MG tablet Take 1 tablet (40 mg total) by mouth daily at 6 PM.   buPROPion (WELLBUTRIN XL) 150 MG 24 hr tablet Take 1 tablet (150 mg total) by mouth daily. (Patient not taking: Reported on 01/12/2022)   HYDROcodone-acetaminophen (NORCO/VICODIN) 5-325 MG tablet Take 1 tablet by mouth 1 day or 1 dose. (Patient not taking: Reported on 01/12/2022)   levothyroxine (EUTHYROX) 75 MCG tablet Take 1 tablet (75 mcg total) by mouth daily before breakfast. Please schedule office visit before any future refills   levothyroxine (SYNTHROID) 75 MCG tablet Take 1 tablet by mouth daily.   metoprolol tartrate (LOPRESSOR) 25 MG tablet Take 1 tablet by mouth as needed.   nitroGLYCERIN (NITROSTAT) 0.4 MG SL tablet Place 1 tablet (0.4 mg total) under the tongue every 5 (five) minutes as needed for chest pain.   pantoprazole (PROTONIX) 40 MG tablet Take 1 tablet by mouth once daily   polyethylene glycol-electrolytes (NULYTELY) 420 g solution Prepare according to package instructions. Starting at 5:00 PM: Drink one 8 oz glass of mixture every 15 minutes until you finish half  of the jug. Five hours prior to procedure, drink 8 oz glass of mixture every 15 minutes until it is all gone. Make sure you do not drink anything 4 hours prior to your procedure.   rivaroxaban (XARELTO) 20 MG TABS tablet Take 1 tablet (20 mg total) by mouth daily with supper.   sertraline (ZOLOFT) 100 MG tablet TAKE 1 & 1/2 (ONE & ONE-HALF) TABLETS BY MOUTH ONCE DAILY NEED  TO  SCHEDULE  AN  APPOINTMENT   No facility-administered encounter medications on file as of 04/21/2022.    Allergies  (verified) Sulfa antibiotics, Amoxicillin, and Penicillins   History: Past Medical History:  Diagnosis Date   A-fib ([place])    Cancer ([place])    skin ca   Depression    mild major depression   GERD (gastroesophageal reflux disease)    Hyperlipidemia    Hypertension    Hypothyroidism    Past Surgical History:  Procedure Laterality Date   ABDOMINAL HYSTERECTOMY  03/1971   Status Post Hysterectomy   CATARACT EXTRACTION Left    COLONOSCOPY     2010   COLONOSCOPY WITH PROPOFOL N/A 01/12/2022   Procedure: COLONOSCOPY WITH PROPOFOL;  Surgeon: Lucilla Lame, MD;  Location: [facility] ENDOSCOPY;  Service: Endoscopy;  Laterality: N/A;   EYE SURGERY     TONSILLECTOMY     TONSILLECTOMY AND ADENOIDECTOMY  1951   WISDOM TOOTH EXTRACTION  1970   Family History  Problem Relation Age of Onset   Cancer Sister        Has had Breast Cancer and is in remission   Breast cancer Sister 11   Congenital heart disease Son    Social History   Socioeconomic History   Marital status: Married    Spouse name: Not on file   Number of children: 2   Years of education: Not on file   Highest education level: Some college, no degree  Occupational History   Occupation: retired  Tobacco Use   Smoking status: Never   Smokeless tobacco: Never  Scientific laboratory technician Use: Never used  Substance and Sexual Activity   Alcohol use: No   Drug use: No   Sexual activity: Yes  Other Topics Concern   Not on file  Social History Narrative   Not on file   Social Determinants of Health   Financial Resource Strain: Not on file  Food Insecurity: Not on file  Transportation Needs: Not on file  Physical Activity: Not on file  Stress: Not on file  Social Connections: Not on file    Tobacco Counseling Counseling given: Not Answered   Clinical Intake:  Pre-visit preparation completed: Yes  Pain : No/denies pain     Nutritional Risks: None Diabetes: Yes CBG done?: No Did pt. bring in CBG monitor from home?:  No  How often do you need to have someone help you when you read instructions, pamphlets, or other written materials from your doctor or pharmacy?: 1 - Never  Diabetic?yes Nutrition Risk Assessment:  Has the patient had any N/V/D within the last 2 months?  Yes  Does the patient have any non-healing wounds?  No  Has the patient had any unintentional weight loss or weight gain?  No   Diabetes:  Is the patient diabetic?  Yes  If diabetic, was a CBG obtained today?  No  Did the patient bring in their glucometer from home?  No  How often do you monitor your CBG's? never.   Financial Strains and  Diabetes Management:  Are you having any financial strains with the device, your supplies or your medication? No .  Does the patient want to be seen by Chronic Care Management for management of their diabetes?  No  Would the patient like to be referred to a Nutritionist or for Diabetic Management?  No   Diabetic Exams:  Diabetic Eye Exam: Completed no- Overdue for diabetic eye exam. Pt has been advised about the importance in completing this exam.   Diabetic Foot Exam: Completed no. Pt has been advised about the importance in completing this exam.    Interpreter Needed?: No  Information entered by :: Kirke Shaggy, LPN   Activities of Daily Living    06/09/2021   11:32 AM  In your present state of health, do you have any difficulty performing the following activities:  Hearing? 0  Vision? 0  Difficulty concentrating or making decisions? 1  Walking or climbing stairs? 0  Dressing or bathing? 0  Doing errands, shopping? 0    Patient Care Team: Jerrol Banana., MD as PCP - General (Family Medicine) Birder Robson, MD as Referring Physician (Ophthalmology) Corey Skains, MD as Consulting Physician (Cardiology) Oneta Rack, MD as Consulting Physician (Dermatology)  Indicate any recent Medical Services you may have received from other than [facility] providers in the  past year (date may be approximate).     Assessment:   This is a routine wellness examination for Tara Marquez.  Hearing/Vision screen No results found.  Dietary issues and exercise activities discussed:     Goals Addressed   None    Depression Screen    06/09/2021   11:31 AM 02/09/2021   11:25 AM 07/07/2020    4:20 PM 05/13/2020    9:14 AM 11/27/2019    1:56 PM 03/08/2019   12:03 PM 10/30/2018   12:00 PM  PHQ 2/9 Scores  PHQ - 2 Score '2 2 3 4 '$ 0 0 0  PHQ- 9 Score '4 6 11 12 '$ 0      Fall Risk    06/09/2021   11:32 AM 02/09/2021   11:25 AM 05/13/2020    9:17 AM 11/27/2019    1:55 PM 03/08/2019   12:03 PM  [place] in the past year? 1 1 0 0 0  Number falls in past yr: 0 1 0 0   Comment  2     Injury with Fall? 1 1 0 0   Risk for fall due to : History of fall(s) Impaired mobility     Follow up Falls evaluation completed Falls evaluation completed       FALL RISK PREVENTION PERTAINING TO THE HOME:  Any stairs in or around the home? Yes  If so, are there any without handrails? No  Home free of loose throw rugs in walkways, pet beds, electrical cords, etc? Yes  Adequate lighting in your home to reduce risk of falls? Yes   ASSISTIVE DEVICES UTILIZED TO PREVENT FALLS:  Life alert? No  Use of a cane, walker or w/c? No  Grab bars in the bathroom? Yes  Shower chair or bench in shower? Yes  Elevated toilet seat or a handicapped toilet? Yes   Cognitive Function:       05/13/2020    9:19 AM 12/09/2016   10:51 AM  6CIT Screen  What Year? 0 points 0 points  What month? 0 points 0 points  What time? 0 points 0 points  Count back from 20  0 points 0 points  Months in reverse 0 points 0 points  Repeat phrase 4 points 0 points  Total Score 4 points 0 points    Immunizations Immunization History  Administered Date(s) Administered   Fluad Quad(high Dose 65+) 10/17/2019, 10/09/2020   Influenza Split 11/16/2012   Influenza, High Dose Seasonal PF 10/01/2014, 10/04/2015,  10/14/2016, 10/27/2017, 10/04/2018   Influenza-Unspecified 09/26/2014   PFIZER(Purple Top)SARS-COV-2 Vaccination 01/06/2020, 01/26/2020, 08/18/2020   Pneumococcal Conjugate-13 10/14/2016   Pneumococcal Polysaccharide-23 10/12/2008    TDAP status: Due, Education has been provided regarding the importance of this vaccine. Advised may receive this vaccine at local pharmacy or Health Dept. Aware to provide a copy of the vaccination record if obtained from local pharmacy or Health Dept. Verbalized acceptance and understanding.  Flu Vaccine status: Up to date  Pneumococcal vaccine status: Up to date  Covid-19 vaccine status: Completed vaccines  Qualifies for Shingles Vaccine? Yes   Zostavax completed No   Shingrix Completed?: No.    Education has been provided regarding the importance of this vaccine. Patient has been advised to call insurance company to determine out of pocket expense if they have not yet received this vaccine. Advised may also receive vaccine at local pharmacy or Health Dept. Verbalized acceptance and understanding.  Screening Tests Health Maintenance  Topic Date Due   TETANUS/TDAP  Never done   Zoster Vaccines- Shingrix (1 of 2) Never done   COVID-19 Vaccine (4 - Booster for Pfizer series) 10/13/2020   INFLUENZA VACCINE  07/27/2022   Pneumonia Vaccine 38+ Years old  Completed   DEXA SCAN  Completed   HPV VACCINES  Aged Out    Health Maintenance  Health Maintenance Due  Topic Date Due   TETANUS/TDAP  Never done   Zoster Vaccines- Shingrix (1 of 2) Never done   COVID-19 Vaccine (4 - Booster for Pfizer series) 10/13/2020    Colorectal cancer screening: No longer required.   Mammogram status: No longer required due to age.  Declined [facility] referral  Lung Cancer Screening: (Low Dose CT Chest recommended if Age 8-80 years, 30 pack-year currently smoking OR have quit w/in 15years.) does not qualify.   Additional Screening:  Hepatitis C Screening: does not  qualify; Completed no  Vision Screening: Recommended annual ophthalmology exams for early detection of glaucoma and other disorders of the eye. Is the patient up to date with their annual eye exam?  Yes  Who is the provider or what is the name of the office in which the patient attends annual eye exams? [facility] If pt is not established with a provider, would they like to be referred to a provider to establish care? No .   Dental Screening: Recommended annual dental exams for proper oral hygiene  Community Resource Referral / Chronic Care Management: CRR required this visit?  No   CCM required this visit?  No      Plan:     I have personally reviewed and noted the following in the patient's chart:   Medical and social history Use of alcohol, tobacco or illicit drugs  Current medications and supplements including opioid prescriptions.  Functional ability and status Nutritional status Physical activity Advanced directives List of other physicians Hospitalizations, surgeries, and ER visits in previous 12 months Vitals Screenings to include cognitive, depression, and falls Referrals and appointments  In addition, I have reviewed and discussed with patient certain preventive protocols, quality metrics, and best practice recommendations. A written personalized care plan for preventive services  as well as general preventive health recommendations were provided to patient.     Tara David, LPN   [id number]   Nurse Notes: none

## 2022-04-21 NOTE — Patient Instructions (Signed)
Ms. Rhude , ?Thank you for taking time to come for your Medicare Wellness Visit. I appreciate your ongoing commitment to your health goals. Please review the following plan we discussed and let me know if I can assist you in the future.  ? ?Screening recommendations/referrals: ?Colonoscopy: aged out ?Mammogram: aged out ?Bone Density: declined referral ?Recommended yearly ophthalmology/optometry visit for glaucoma screening and checkup ?Recommended yearly dental visit for hygiene and checkup ? ?Vaccinations: ?Influenza vaccine: had at [facility] this season ?Pneumococcal vaccine: 10/14/16 ?Tdap vaccine: n/d ?Shingles vaccine: n/d   ?Covid-19:01/06/20, 01/26/20, 08/18/20 ? ?Advanced directives: no ? ?Conditions/risks identified: none ? ?Next appointment: Follow up in one year for your annual wellness visit 04/26/23 @ 10:45am by phone ? ? ?Preventive Care 80 Years and Older, Female ?Preventive care refers to lifestyle choices and visits with your health care provider that can promote health and wellness. ?What does preventive care include? ?A yearly physical exam. This is also called an annual well check. ?Dental exams once or twice a year. ?Routine eye exams. Ask your health care provider how often you should have your eyes checked. ?Personal lifestyle choices, including: ?Daily care of your teeth and gums. ?Regular physical activity. ?Eating a healthy diet. ?Avoiding tobacco and drug use. ?Limiting alcohol use. ?Practicing safe sex. ?Taking low-dose aspirin every day. ?Taking vitamin and mineral supplements as recommended by your health care provider. ?What happens during an annual well check? ?The services and screenings done by your health care provider during your annual well check will depend on your age, overall health, lifestyle risk factors, and family history of disease. ?Counseling  ?Your health care provider may ask you questions about your: ?Alcohol use. ?Tobacco use. ?Drug use. ?Emotional well-being. ?Home and  relationship well-being. ?Sexual activity. ?Eating habits. ?History of falls. ?Memory and ability to understand (cognition). ?Work and work Statistician. ?Reproductive health. ?Screening  ?You may have the following tests or measurements: ?Height, weight, and BMI. ?Blood pressure. ?Lipid and cholesterol levels. These may be checked every 5 years, or more frequently if you are over 63 years old. ?Skin check. ?Lung cancer screening. You may have this screening every year starting at age 70 if you have a 30-pack-year history of smoking and currently smoke or have quit within the past 15 years. ?Fecal occult blood test (FOBT) of the stool. You may have this test every year starting at age 10. ?Flexible sigmoidoscopy or colonoscopy. You may have a sigmoidoscopy every 5 years or a colonoscopy every 10 years starting at age 47. ?Hepatitis C blood test. ?Hepatitis B blood test. ?Sexually transmitted disease (STD) testing. ?Diabetes screening. This is done by checking your blood sugar (glucose) after you have not eaten for a while (fasting). You may have this done every 1-3 years. ?Bone density scan. This is done to screen for osteoporosis. You may have this done starting at age 38. ?Mammogram. This may be done every 1-2 years. Talk to your health care provider about how often you should have regular mammograms. ?Talk with your health care provider about your test results, treatment options, and if necessary, the need for more tests. ?Vaccines  ?Your health care provider may recommend certain vaccines, such as: ?Influenza vaccine. This is recommended every year. ?Tetanus, diphtheria, and acellular pertussis (Tdap, Td) vaccine. You may need a Td booster every 10 years. ?Zoster vaccine. You may need this after age 58. ?Pneumococcal 13-valent conjugate (PCV13) vaccine. One dose is recommended after age 50. ?Pneumococcal polysaccharide (PPSV23) vaccine. One dose is recommended after age  38. ?Talk to your health care provider  about which screenings and vaccines you need and how often you need them. ?This information is not intended to replace advice given to you by your health care provider. Make sure you discuss any questions you have with your health care provider. ?Document Released: 01/09/2016 Document Revised: 09/01/2016 Document Reviewed: 10/14/2015 ?Elsevier Interactive Patient Education ? 2017 [place]. ? ?Fall Prevention in the Home ?Falls can cause injuries. They can happen to people of all ages. There are many things you can do to make your home safe and to help prevent falls. ?What can I do on the outside of my home? ?Regularly fix the edges of walkways and driveways and fix any cracks. ?Remove anything that might make you trip as you walk through a door, such as a raised step or threshold. ?Trim any bushes or trees on the path to your home. ?Use bright outdoor lighting. ?Clear any walking paths of anything that might make someone trip, such as rocks or tools. ?Regularly check to see if handrails are loose or broken. Make sure that both sides of any steps have handrails. ?Any raised decks and porches should have guardrails on the edges. ?Have any leaves, snow, or ice cleared regularly. ?Use sand or salt on walking paths during winter. ?Clean up any spills in your garage right away. This includes oil or grease spills. ?What can I do in the bathroom? ?Use night lights. ?Install grab bars by the toilet and in the tub and shower. Do not use towel bars as grab bars. ?Use non-skid mats or decals in the tub or shower. ?If you need to sit down in the shower, use a plastic, non-slip stool. ?Keep the floor dry. Clean up any water that spills on the floor as soon as it happens. ?Remove soap buildup in the tub or shower regularly. ?Attach bath mats securely with double-sided non-slip rug tape. ?Do not have throw rugs and other things on the floor that can make you trip. ?What can I do in the bedroom? ?Use night lights. ?Make sure  that you have a light by your bed that is easy to reach. ?Do not use any sheets or blankets that are too big for your bed. They should not hang down onto the floor. ?Have a firm chair that has side arms. You can use this for support while you get dressed. ?Do not have throw rugs and other things on the floor that can make you trip. ?What can I do in the kitchen? ?Clean up any spills right away. ?Avoid walking on wet floors. ?Keep items that you use a lot in easy-to-reach places. ?If you need to reach something above you, use a strong step stool that has a grab bar. ?Keep electrical cords out of the way. ?Do not use floor polish or wax that makes floors slippery. If you must use wax, use non-skid floor wax. ?Do not have throw rugs and other things on the floor that can make you trip. ?What can I do with my stairs? ?Do not leave any items on the stairs. ?Make sure that there are handrails on both sides of the stairs and use them. Fix handrails that are broken or loose. Make sure that handrails are as long as the stairways. ?Check any carpeting to make sure that it is firmly attached to the stairs. Fix any carpet that is loose or worn. ?Avoid having throw rugs at the top or bottom of the stairs. If you do have  throw rugs, attach them to the floor with carpet tape. ?Make sure that you have a light switch at the top of the stairs and the bottom of the stairs. If you do not have them, ask someone to add them for you. ?What else can I do to help prevent falls? ?Wear shoes that: ?Do not have high heels. ?Have rubber bottoms. ?Are comfortable and fit you well. ?Are closed at the toe. Do not wear sandals. ?If you use a stepladder: ?Make sure that it is fully opened. Do not climb a closed stepladder. ?Make sure that both sides of the stepladder are locked into place. ?Ask someone to hold it for you, if possible. ?Clearly mark and make sure that you can see: ?Any grab bars or handrails. ?First and last steps. ?Where the edge of  each step is. ?Use tools that help you move around (mobility aids) if they are needed. These include: ?Canes. ?Walkers. ?Scooters. ?Crutches. ?Turn on the lights when you go into a dark area. Replace any l

## 2022-04-22 DIAGNOSIS — I872 Venous insufficiency (chronic) (peripheral): Secondary | ICD-10-CM | POA: Diagnosis not present

## 2022-04-22 DIAGNOSIS — C44729 Squamous cell carcinoma of skin of left lower limb, including hip: Secondary | ICD-10-CM | POA: Diagnosis not present

## 2022-04-22 DIAGNOSIS — D485 Neoplasm of uncertain behavior of skin: Secondary | ICD-10-CM | POA: Diagnosis not present

## 2022-04-22 DIAGNOSIS — Z85828 Personal history of other malignant neoplasm of skin: Secondary | ICD-10-CM | POA: Diagnosis not present

## 2022-04-22 DIAGNOSIS — Z08 Encounter for follow-up examination after completed treatment for malignant neoplasm: Secondary | ICD-10-CM | POA: Diagnosis not present

## 2022-04-26 ENCOUNTER — Ambulatory Visit (INDEPENDENT_AMBULATORY_CARE_PROVIDER_SITE_OTHER): Payer: Medicare Other | Admitting: Family Medicine

## 2022-04-26 VITALS — BP 135/55 | HR 71 | Wt 153.0 lb

## 2022-04-26 DIAGNOSIS — R2681 Unsteadiness on feet: Secondary | ICD-10-CM | POA: Diagnosis not present

## 2022-04-26 DIAGNOSIS — I4891 Unspecified atrial fibrillation: Secondary | ICD-10-CM

## 2022-04-26 DIAGNOSIS — E039 Hypothyroidism, unspecified: Secondary | ICD-10-CM

## 2022-04-26 DIAGNOSIS — K2100 Gastro-esophageal reflux disease with esophagitis, without bleeding: Secondary | ICD-10-CM

## 2022-04-26 DIAGNOSIS — R634 Abnormal weight loss: Secondary | ICD-10-CM | POA: Diagnosis not present

## 2022-04-26 DIAGNOSIS — E785 Hyperlipidemia, unspecified: Secondary | ICD-10-CM

## 2022-04-26 DIAGNOSIS — I10 Essential (primary) hypertension: Secondary | ICD-10-CM

## 2022-04-26 DIAGNOSIS — J301 Allergic rhinitis due to pollen: Secondary | ICD-10-CM | POA: Diagnosis not present

## 2022-04-26 DIAGNOSIS — M353 Polymyalgia rheumatica: Secondary | ICD-10-CM

## 2022-04-26 DIAGNOSIS — F320 Major depressive disorder, single episode, mild: Secondary | ICD-10-CM

## 2022-04-26 NOTE — Progress Notes (Signed)
?  ? ? ?Established patient visit ? ? ?Patient: Tara Marquez   DOB: 1941-08-26   81 y.o. Female  MRN: [id number] ?Visit Date: 04/26/2022 ? ?Today's healthcare provider: [name], MD  ? ?No chief complaint on file. ? ?Subjective  ?  ?HPI  ?Patient comes in today for follow-up.  She has been feeling fairly well.  Her stress level is high she is feeling slower and older.  He has had some mild unintentional weight loss over the past year.  No other symptoms other than what is described above.  Swallowing problems no blood in her urine or stool no dysphagia no problems with her bowels. No. Fevers.,  Chest Pain or shortness of breath. ?Hypertension, follow-up ? ?BP Readings from Last 3 Encounters:  ?04/26/22 (!) 135/55  ?01/12/22 106/70  ?12/15/21 (!) 154/78  ? Wt Readings from Last 3 Encounters:  ?04/26/22 153 lb (69.4 kg)  ?04/21/22 162 lb (73.5 kg)  ?01/12/22 162 lb 5.2 oz (73.6 kg)  ?  ? ?She was last seen for hypertension 6 months ago.  ?BP at that visit was as above. Management since that visit includes none. ? ?Symptoms: ?No chest pain No chest pressure  ?No palpitations No syncope  ?No dyspnea No orthopnea  ?No paroxysmal nocturnal dyspnea Yes lower extremity edema  ? ?Pertinent labs ?Lab Results  ?Component Value Date  ? CHOL 135 06/11/2021  ? HDL 50 06/11/2021  ? Hoberg 67 06/11/2021  ? TRIG 93 06/11/2021  ? CHOLHDL 2.7 06/11/2021  ? Lab Results  ?Component Value Date  ? NA 142 06/11/2021  ? K 4.4 06/11/2021  ? CREATININE 0.91 06/11/2021  ? EGFR 64 06/11/2021  ? GLUCOSE 94 06/11/2021  ? TSH 5.690 (H) 06/11/2021  ?  ? ?The ASCVD Risk score (Arnett DK, et al., 2019) failed to calculate for the following reasons: ?  The 2019 ASCVD risk score is only valid for ages 67 to 77 ? ?--------------------------------------------------------------------------------------------------- ? ? ?Medications: ?Outpatient Medications Prior to Visit  ?Medication Sig  ? amiodarone (PACERONE) 200 MG tablet Take 1 tablet (200  mg total) by mouth 2 (two) times daily. (Patient taking differently: Take 100 mg by mouth daily.)  ? atorvastatin (LIPITOR) 40 MG tablet Take 1 tablet (40 mg total) by mouth daily at 6 PM.  ? atorvastatin (LIPITOR) 40 MG tablet Take by mouth.  ? levothyroxine (EUTHYROX) 75 MCG tablet Take 1 tablet (75 mcg total) by mouth daily before breakfast. Please schedule office visit before any future refills  ? levothyroxine (SYNTHROID) 75 MCG tablet Take 1 tablet by mouth daily.  ? metoprolol tartrate (LOPRESSOR) 25 MG tablet Take 1 tablet by mouth as needed.  ? nitroGLYCERIN (NITROSTAT) 0.4 MG SL tablet Place 1 tablet (0.4 mg total) under the tongue every 5 (five) minutes as needed for chest pain.  ? pantoprazole (PROTONIX) 40 MG tablet Take 1 tablet by mouth once daily  ? polyethylene glycol-electrolytes (NULYTELY) 420 g solution Prepare according to package instructions. Starting at 5:00 PM: Drink one 8 oz glass of mixture every 15 minutes until you finish half of the jug. Five hours prior to procedure, drink 8 oz glass of mixture every 15 minutes until it is all gone. Make sure you do not drink anything 4 hours prior to your procedure.  ? rivaroxaban (XARELTO) 20 MG TABS tablet Take 1 tablet (20 mg total) by mouth daily with supper.  ? rivaroxaban (XARELTO) 20 MG TABS tablet Take by mouth.  ? sertraline (ZOLOFT)  100 MG tablet TAKE 1 & 1/2 (ONE & ONE-HALF) TABLETS BY MOUTH ONCE DAILY NEED  TO  SCHEDULE  AN  APPOINTMENT  ? [DISCONTINUED] buPROPion (WELLBUTRIN XL) 150 MG 24 hr tablet Take 1 tablet (150 mg total) by mouth daily. (Patient not taking: Reported on 01/12/2022)  ? [DISCONTINUED] HYDROcodone-acetaminophen (NORCO/VICODIN) 5-325 MG tablet Take 1 tablet by mouth 1 day or 1 dose. (Patient not taking: Reported on 01/12/2022)  ? ?No facility-administered medications prior to visit.  ? ? ?Review of Systems ? ?  ?  Objective  ?  ?BP (!) 135/55 (BP Location: Left Arm, Patient Position: Sitting, Cuff Size: Normal)   Pulse 71    Wt 153 lb (69.4 kg)   SpO2 99%   BMI 25.46 kg/m?  ?BP Readings from Last 3 Encounters:  ?04/26/22 (!) 135/55  ?01/12/22 106/70  ?12/15/21 (!) 154/78  ? ?Wt Readings from Last 3 Encounters:  ?04/26/22 153 lb (69.4 kg)  ?04/21/22 162 lb (73.5 kg)  ?01/12/22 162 lb 5.2 oz (73.6 kg)  ? ?  ? ?Physical Exam ?Vitals reviewed.  ?Constitutional:   ?   Appearance: She is well-developed.  ?HENT:  ?   Head: Normocephalic and atraumatic.  ?Eyes:  ?   General: No scleral icterus. ?   Conjunctiva/sclera: Conjunctivae normal.  ?Cardiovascular:  ?   Rate and Rhythm: Normal rate and regular rhythm.  ?   Heart sounds: Normal heart sounds.  ?Pulmonary:  ?   Effort: Pulmonary effort is normal.  ?   Breath sounds: Normal breath sounds.  ?Abdominal:  ?   Palpations: Abdomen is soft.  ?   Tenderness: There is no abdominal tenderness.  ?Musculoskeletal:  ?   Right lower leg: No edema.  ?   Left lower leg: No edema.  ?Skin: ?   General: Skin is warm and dry.  ?   Comments: Very fair skinned  ?Neurological:  ?   General: No focal deficit present.  ?   Mental Status: She is alert and oriented to person, place, and time.  ?   Comments: Gait is normal.  Grossly nonfocal exam  ?Psychiatric:     ?   Mood and Affect: Mood normal.     ?   Behavior: Behavior normal.     ?   Thought Content: Thought content normal.     ?   Judgment: Judgment normal.  ?  ? ? ?No results found for any visits on 04/26/22. ? Assessment & Plan  ?  ? ?1. Weight loss ?She has lost 5% of her body weight in the past year.  I think this is multifactorial but at this time we will obtain lab work.  She had a normal colonoscopy in January of this year.  She declines any further work-up other labs ?We will follow-up in 2 to 3 months.. ?- CBC ?- COMPLETE METABOLIC PANEL WITH GFR ? ?2. Hyperlipidemia, unspecified hyperlipidemia type ? ?- CBC ?- COMPLETE METABOLIC PANEL WITH GFR ? ?3. Adult hypothyroidism ? ?- TSH ? ?4. Atrial fibrillation, unspecified type ([place]) ?The other room  Xarelto ?- CBC ?- COMPLETE METABOLIC PANEL WITH GFR ? ?5. Unsteady gait ?Need to try to stay active in a safe manner. ? ?6. Mild major depression ([place]) ?On bupropion and sertraline ?Sitter counseling ?7. Seasonal allergic rhinitis due to pollen ? ? ?8. Polymyalgia rheumatica ([place]) ?History of this diagnosis.  Obtain sed rate with next lab draw.  This is in remission ? ?9. Gastroesophageal reflux disease with  esophagitis without hemorrhage ? ? ?10. Benign essential HTN ?Controlled. ? ? ?No follow-ups on file.  ?   ? ?I, [name], MD, have reviewed all documentation for this visit. The documentation on 04/30/22 for the exam, diagnosis, procedures, and orders are all accurate and complete. ? ? ? ?Devion Chriscoe Cranford Mon, MD  ?[facility] ?[phone] (phone) ?[phone] (fax) ? ?[facility] ?

## 2022-04-27 LAB — COMPREHENSIVE METABOLIC PANEL
ALT: 22 IU/L (ref 0–32)
AST: 28 IU/L (ref 0–40)
Albumin/Globulin Ratio: 2.1 (ref 1.2–2.2)
Albumin: 4.4 g/dL (ref 3.7–4.7)
Alkaline Phosphatase: 102 IU/L (ref 44–121)
BUN/Creatinine Ratio: 18 (ref 12–28)
BUN: 19 mg/dL (ref 8–27)
Bilirubin Total: 0.5 mg/dL (ref 0.0–1.2)
CO2: 23 mmol/L (ref 20–29)
Calcium: 9.1 mg/dL (ref 8.7–10.3)
Chloride: 105 mmol/L (ref 96–106)
Creatinine, Ser: 1.03 mg/dL — ABNORMAL HIGH (ref 0.57–1.00)
Globulin, Total: 2.1 g/dL (ref 1.5–4.5)
Glucose: 99 mg/dL (ref 70–99)
Potassium: 4.2 mmol/L (ref 3.5–5.2)
Sodium: 142 mmol/L (ref 134–144)
Total Protein: 6.5 g/dL (ref 6.0–8.5)
eGFR: 55 mL/min/{1.73_m2} — ABNORMAL LOW (ref >59)

## 2022-04-27 LAB — CBC
Hematocrit: 33.1 % — ABNORMAL LOW (ref 34.0–46.6)
Hemoglobin: 10.5 g/dL — ABNORMAL LOW (ref 11.1–15.9)
MCH: 24.2 pg — ABNORMAL LOW (ref 26.6–33.0)
MCHC: 31.7 g/dL (ref 31.5–35.7)
MCV: 76 fL — ABNORMAL LOW (ref 79–97)
Platelets: 174 10*3/uL (ref 150–450)
RBC: 4.34 x10E6/uL (ref 3.77–5.28)
RDW: 15 % (ref 11.7–15.4)
WBC: 6.7 10*3/uL (ref 3.4–10.8)

## 2022-04-27 LAB — TSH: TSH: 2.09 u[IU]/mL (ref 0.450–4.500)

## 2022-04-30 ENCOUNTER — Other Ambulatory Visit: Payer: Self-pay | Admitting: *Deleted

## 2022-04-30 DIAGNOSIS — D649 Anemia, unspecified: Secondary | ICD-10-CM

## 2022-05-26 DIAGNOSIS — D0472 Carcinoma in situ of skin of left lower limb, including hip: Secondary | ICD-10-CM | POA: Diagnosis not present

## 2022-05-26 DIAGNOSIS — C44729 Squamous cell carcinoma of skin of left lower limb, including hip: Secondary | ICD-10-CM | POA: Diagnosis not present

## 2022-06-23 DIAGNOSIS — E782 Mixed hyperlipidemia: Secondary | ICD-10-CM | POA: Diagnosis not present

## 2022-06-23 DIAGNOSIS — I493 Ventricular premature depolarization: Secondary | ICD-10-CM | POA: Diagnosis not present

## 2022-06-23 DIAGNOSIS — I480 Paroxysmal atrial fibrillation: Secondary | ICD-10-CM | POA: Diagnosis not present

## 2022-06-23 DIAGNOSIS — I10 Essential (primary) hypertension: Secondary | ICD-10-CM | POA: Diagnosis not present

## 2022-07-07 DIAGNOSIS — I10 Essential (primary) hypertension: Secondary | ICD-10-CM | POA: Diagnosis not present

## 2022-07-07 DIAGNOSIS — I493 Ventricular premature depolarization: Secondary | ICD-10-CM | POA: Diagnosis not present

## 2022-07-07 DIAGNOSIS — E782 Mixed hyperlipidemia: Secondary | ICD-10-CM | POA: Diagnosis not present

## 2022-07-07 DIAGNOSIS — I480 Paroxysmal atrial fibrillation: Secondary | ICD-10-CM | POA: Diagnosis not present

## 2022-07-15 ENCOUNTER — Encounter: Payer: Self-pay | Admitting: Family Medicine

## 2022-07-15 ENCOUNTER — Ambulatory Visit (INDEPENDENT_AMBULATORY_CARE_PROVIDER_SITE_OTHER): Payer: Medicare Other | Admitting: Family Medicine

## 2022-07-15 VITALS — BP 156/63 | HR 50 | Temp 97.6°F | Resp 16 | Wt 147.9 lb

## 2022-07-15 DIAGNOSIS — F320 Major depressive disorder, single episode, mild: Secondary | ICD-10-CM | POA: Diagnosis not present

## 2022-07-15 DIAGNOSIS — I10 Essential (primary) hypertension: Secondary | ICD-10-CM

## 2022-07-15 DIAGNOSIS — R634 Abnormal weight loss: Secondary | ICD-10-CM | POA: Diagnosis not present

## 2022-07-15 DIAGNOSIS — I480 Paroxysmal atrial fibrillation: Secondary | ICD-10-CM

## 2022-07-15 DIAGNOSIS — E039 Hypothyroidism, unspecified: Secondary | ICD-10-CM

## 2022-07-15 DIAGNOSIS — R2681 Unsteadiness on feet: Secondary | ICD-10-CM

## 2022-07-15 DIAGNOSIS — D649 Anemia, unspecified: Secondary | ICD-10-CM | POA: Diagnosis not present

## 2022-07-15 NOTE — Progress Notes (Signed)
Established patient visit  I,Joseline E Rosas,acting as a scribe for [name], MD.,have documented all relevant documentation on the behalf of [name], MD,as directed by  [name], MD while in the presence of [name], MD.   Patient: Tara Marquez   DOB: Jul 28, 1941   81 y.o. Female  MRN: [id number] Visit Date: 07/15/2022  Today's healthcare provider: [name], MD   Chief Complaint  Patient presents with   Follow-up HTN   Subjective    HPI  Patient comes in today for follow-up.  She has been feeling fairly well.  Hypertension, follow-up  BP Readings from Last 3 Encounters:  07/15/22 (!) 156/63  04/26/22 (!) 135/55  01/12/22 106/70   Wt Readings from Last 3 Encounters:  07/15/22 147 lb 14.4 oz (67.1 kg)  04/26/22 153 lb (69.4 kg)  04/21/22 162 lb (73.5 kg)     She was last seen for hypertension 2 months ago.  Management since that visit includes; controlled. Labs checked showing-Labs stable. Borderline anemic. Advised to repeat CBC and iron panel 2-3 weeks. Labs not done and are still pending.   Outside blood pressures are not being checked.  Pertinent labs Lab Results  Component Value Date   CHOL 135 06/11/2021   HDL 50 06/11/2021   LDLCALC 67 06/11/2021   TRIG 93 06/11/2021   CHOLHDL 2.7 06/11/2021   Lab Results  Component Value Date   NA 142 04/26/2022   K 4.2 04/26/2022   CREATININE 1.03 (H) 04/26/2022   EGFR 55 (L) 04/26/2022   GLUCOSE 99 04/26/2022   TSH 2.090 04/26/2022     The ASCVD Risk score (Arnett DK, et al., 2019) failed to calculate for the following reasons:   The 2019 ASCVD risk score is only valid for ages 62 to 43  --------------------------------------------------------------------------------------------------- Paroxysmal Atrial fibrillation:Patient reports that she was seen by Cardio on 06/23/2022 and she was found to be in atrial fibrillation by EKG. She went to her follow-up  appointment on 07/07/2022 and reports that they made changes to her medications.EKG at this time showed sinus bradycardia. They had increase her medications on 06/23/2022 but they Decreased her medications on 07/07/2022 --Decrease amiodarone to 200 mg once daily for concerns of bradycardia -Decrease metoprolol to 12.5 mg twice daily for concerns of bradycardia -Continue Xarelto 20 mg daily for stroke risk reduction with atrial fibrillation She is to return in 2 months.  Medications: Outpatient Medications Prior to Visit  Medication Sig   amiodarone (PACERONE) 200 MG tablet Take 1 tablet (200 mg total) by mouth 2 (two) times daily. (Patient taking differently: Take 100 mg by mouth daily.)   atorvastatin (LIPITOR) 40 MG tablet Take 1 tablet (40 mg total) by mouth daily at 6 PM.   atorvastatin (LIPITOR) 40 MG tablet Take by mouth.   levothyroxine (EUTHYROX) 75 MCG tablet Take 1 tablet (75 mcg total) by mouth daily before breakfast. Please schedule office visit before any future refills   levothyroxine (SYNTHROID) 75 MCG tablet Take 1 tablet by mouth daily.   metoprolol tartrate (LOPRESSOR) 25 MG tablet Take 1 tablet by mouth as needed.   nitroGLYCERIN (NITROSTAT) 0.4 MG SL tablet Place 1 tablet (0.4 mg total) under the tongue every 5 (five) minutes as needed for chest pain.   pantoprazole (PROTONIX) 40 MG tablet Take 1 tablet by mouth once daily   polyethylene glycol-electrolytes (NULYTELY) 420 g solution Prepare according to package instructions. Starting at 5:00 PM:  Drink one 8 oz glass of mixture every 15 minutes until you finish half of the jug. Five hours prior to procedure, drink 8 oz glass of mixture every 15 minutes until it is all gone. Make sure you do not drink anything 4 hours prior to your procedure.   rivaroxaban (XARELTO) 20 MG TABS tablet Take 1 tablet (20 mg total) by mouth daily with supper.   rivaroxaban (XARELTO) 20 MG TABS tablet Take by mouth.   sertraline (ZOLOFT) 100 MG tablet  TAKE 1 & 1/2 (ONE & ONE-HALF) TABLETS BY MOUTH ONCE DAILY NEED  TO  SCHEDULE  AN  APPOINTMENT   No facility-administered medications prior to visit.    Review of Systems  Constitutional:  Negative for appetite change, chills, fatigue and fever.  Respiratory:  Negative for chest tightness and shortness of breath.   Cardiovascular:  Negative for chest pain and palpitations.  Gastrointestinal:  Negative for abdominal pain, nausea and vomiting.  Neurological:  Negative for dizziness and weakness.    Last thyroid functions Lab Results  Component Value Date   TSH 2.090 04/26/2022       Objective    BP (!) 156/63 (BP Location: Left Arm, Patient Position: Sitting, Cuff Size: Normal)   Pulse (!) 50   Temp 97.6 F (36.4 C) (Oral)   Resp 16   Wt 147 lb 14.4 oz (67.1 kg)   SpO2 96%   BMI 24.61 kg/m  BP Readings from Last 3 Encounters:  07/15/22 (!) 156/63  04/26/22 (!) 135/55  01/12/22 106/70   Wt Readings from Last 3 Encounters:  07/15/22 147 lb 14.4 oz (67.1 kg)  04/26/22 153 lb (69.4 kg)  04/21/22 162 lb (73.5 kg)      Physical Exam Vitals reviewed.  Constitutional:      General: She is not in acute distress.    Appearance: She is well-developed.  HENT:     Head: Normocephalic and atraumatic.     Right Ear: Hearing normal.     Left Ear: Hearing normal.     Nose: Nose normal.  Eyes:     General: Lids are normal. No scleral icterus.       Right eye: No discharge.        Left eye: No discharge.     Conjunctiva/sclera: Conjunctivae normal.  Cardiovascular:     Rate and Rhythm: Normal rate and regular rhythm.     Heart sounds: Normal heart sounds.  Pulmonary:     Effort: Pulmonary effort is normal. No respiratory distress.  Skin:    Findings: No lesion or rash.  Neurological:     General: No focal deficit present.     Mental Status: She is alert and oriented to person, place, and time.  Psychiatric:        Mood and Affect: Mood normal.        Speech: Speech  normal.        Behavior: Behavior normal.        Thought Content: Thought content normal.        Judgment: Judgment normal.       No results found for any visits on 07/15/22.  Assessment & Plan     1. Benign essential HTN Follow home blood pressure readings. -CBC -iron Panel 2. Paroxysmal atrial fibrillation ([place]) Followed by Cardiologist. Keep upcoming appointment  with Dr. Nehemiah Massed 09/16/2022 On amiodarone which could be causing some of her lack of appetite 3. Anemia, unspecified type Follow-up CBC and work-up as indicated.  4. Weight loss Has a gradual weight loss.  Weight is down 17 pounds from a year ago.  5. Unsteady gait Stressed the need to be careful ambulating.  Changes in in her gait or not acute  6. Mild major depression (HCC) Clinically stable on sertraline.  7. Adult hypothyroidism Treat for euthyroid TSH.  No follow-ups on file.      I, [name], MD, have reviewed all documentation for this visit. The documentation on 07/18/22 for the exam, diagnosis, procedures, and orders are all accurate and complete.    Loletta Harper Cranford Mon, MD  [facility] [phone] (phone) [phone] (fax)  [place]

## 2022-07-16 LAB — IRON,TIBC AND FERRITIN PANEL
Ferritin: 24 ng/mL (ref 15–150)
Iron Saturation: 7 % — CL (ref 15–55)
Iron: 34 ug/dL (ref 27–139)
Total Iron Binding Capacity: 467 ug/dL — ABNORMAL HIGH (ref 250–450)
UIBC: 433 ug/dL — ABNORMAL HIGH (ref 118–369)

## 2022-07-16 LAB — CBC WITH DIFFERENTIAL/PLATELET
Basophils Absolute: 0.1 10*3/uL (ref 0.0–0.2)
Basos: 1 %
EOS (ABSOLUTE): 0.1 10*3/uL (ref 0.0–0.4)
Eos: 2 %
Hematocrit: 30.6 % — ABNORMAL LOW (ref 34.0–46.6)
Hemoglobin: 9.5 g/dL — ABNORMAL LOW (ref 11.1–15.9)
Immature Grans (Abs): 0 10*3/uL (ref 0.0–0.1)
Immature Granulocytes: 0 %
Lymphocytes Absolute: 1.3 10*3/uL (ref 0.7–3.1)
Lymphs: 19 %
MCH: 23.7 pg — ABNORMAL LOW (ref 26.6–33.0)
MCHC: 31 g/dL — ABNORMAL LOW (ref 31.5–35.7)
MCV: 76 fL — ABNORMAL LOW (ref 79–97)
Monocytes Absolute: 0.5 10*3/uL (ref 0.1–0.9)
Monocytes: 7 %
Neutrophils Absolute: 4.8 10*3/uL (ref 1.4–7.0)
Neutrophils: 71 %
Platelets: 194 10*3/uL (ref 150–450)
RBC: 4.01 x10E6/uL (ref 3.77–5.28)
RDW: 15.6 % — ABNORMAL HIGH (ref 11.7–15.4)
WBC: 6.7 10*3/uL (ref 3.4–10.8)

## 2022-07-19 ENCOUNTER — Other Ambulatory Visit: Payer: Self-pay | Admitting: Physician Assistant

## 2022-07-19 DIAGNOSIS — D509 Iron deficiency anemia, unspecified: Secondary | ICD-10-CM

## 2022-07-28 ENCOUNTER — Other Ambulatory Visit: Payer: Self-pay

## 2022-07-28 ENCOUNTER — Inpatient Hospital Stay: Payer: Medicare Other

## 2022-07-28 ENCOUNTER — Encounter: Payer: Self-pay | Admitting: Internal Medicine

## 2022-07-28 ENCOUNTER — Inpatient Hospital Stay: Payer: Medicare Other | Attending: Internal Medicine | Admitting: Internal Medicine

## 2022-07-28 VITALS — BP 163/50 | HR 50 | Temp 97.2°F | Resp 18 | Ht 65.0 in | Wt 149.9 lb

## 2022-07-28 DIAGNOSIS — R5383 Other fatigue: Secondary | ICD-10-CM | POA: Diagnosis not present

## 2022-07-28 DIAGNOSIS — R0602 Shortness of breath: Secondary | ICD-10-CM | POA: Diagnosis not present

## 2022-07-28 DIAGNOSIS — I4891 Unspecified atrial fibrillation: Secondary | ICD-10-CM | POA: Insufficient documentation

## 2022-07-28 DIAGNOSIS — D509 Iron deficiency anemia, unspecified: Secondary | ICD-10-CM | POA: Insufficient documentation

## 2022-07-28 DIAGNOSIS — R634 Abnormal weight loss: Secondary | ICD-10-CM | POA: Insufficient documentation

## 2022-07-28 DIAGNOSIS — R3129 Other microscopic hematuria: Secondary | ICD-10-CM | POA: Diagnosis not present

## 2022-07-28 DIAGNOSIS — R079 Chest pain, unspecified: Secondary | ICD-10-CM | POA: Diagnosis not present

## 2022-07-28 DIAGNOSIS — Z7901 Long term (current) use of anticoagulants: Secondary | ICD-10-CM | POA: Insufficient documentation

## 2022-07-28 DIAGNOSIS — R351 Nocturia: Secondary | ICD-10-CM | POA: Insufficient documentation

## 2022-07-28 LAB — URINALYSIS, COMPLETE (UACMP) WITH MICROSCOPIC
Bilirubin Urine: NEGATIVE
Glucose, UA: NEGATIVE mg/dL
Ketones, ur: NEGATIVE mg/dL
Nitrite: NEGATIVE
Protein, ur: NEGATIVE mg/dL
RBC / HPF: >50 RBC/hpf — ABNORMAL HIGH (ref 0–5)
Specific Gravity, Urine: 1.01 (ref 1.005–1.030)
WBC, UA: >50 WBC/hpf — ABNORMAL HIGH (ref 0–5)
pH: 5 (ref 5.0–8.0)

## 2022-07-28 NOTE — Progress Notes (Unsigned)
[facility] Initial Visit:  Patient Care Team: Jerrol Banana., MD as PCP - General (Family Medicine) Birder Robson, MD as Referring Physician (Ophthalmology) Corey Skains, MD as Consulting Physician (Cardiology) Oneta Rack, MD as Consulting Physician (Dermatology)  CHIEF COMPLAINTS/PURPOSE OF CONSULTATION:  Oncology History   No history exists.    HISTORY OF PRESENTING ILLNESS: Tara Marquez 81 y.o. female is here because of iron deficiency anemia.  Patient reports feeling more fatigued and getting sob on exertion. She has not been able to do much yard work this summer. Reports occasional chest pain. She has been having issues with uncontrolled afib and has been closely following with cardiology. She is on xarelto for afib. She reports unintentional weight loss of about 14 lbs in past 5 months. Her appetite is not as good.   She has positive cologaurd test in Oct 2022. Colonoscopy done in Jan 2023 showed non bleeding internal hemorrhoids only. CBC showed worsening anemia with recent Hb 9.5. MCV 76. WBC and platelets normal. Iron panel from July 2023 showed ferritin 24, saturation 7%. She is reluctant to try oral iron supplements. Reports that her son was previously prescribed oral iron and had very difficult time with constipation. Denies any recent gum or nose bleeding or change in urine color or black tarry stools.    Review of Systems - Oncology Positive ROS as above Rest 10 points ROS negative  MEDICAL HISTORY: Past Medical History:  Diagnosis Date   A-fib ([place])    Cancer ([place])    skin ca   Depression    mild major depression   GERD (gastroesophageal reflux disease)    Hyperlipidemia    Hypertension    Hypothyroidism     SURGICAL HISTORY: Past Surgical History:  Procedure Laterality Date   ABDOMINAL HYSTERECTOMY  03/1971   Status Post Hysterectomy   CATARACT EXTRACTION Left    COLONOSCOPY     2010   COLONOSCOPY WITH  PROPOFOL N/A 01/12/2022   Procedure: COLONOSCOPY WITH PROPOFOL;  Surgeon: Lucilla Lame, MD;  Location: [facility] ENDOSCOPY;  Service: Endoscopy;  Laterality: N/A;   EYE SURGERY     TONSILLECTOMY     TONSILLECTOMY AND ADENOIDECTOMY  1951   WISDOM TOOTH EXTRACTION  1970    SOCIAL HISTORY: Social History   Socioeconomic History   Marital status: Married    Spouse name: Not on file   Number of children: 2   Years of education: Not on file   Highest education level: Some college, no degree  Occupational History   Occupation: retired  Tobacco Use   Smoking status: Never   Smokeless tobacco: Never  Vaping Use   Vaping Use: Never used  Substance and Sexual Activity   Alcohol use: No   Drug use: No   Sexual activity: Yes  Other Topics Concern   Not on file  Social History Narrative   Not on file   Social Determinants of Health   Financial Resource Strain: Low Risk  (04/21/2022)   Overall Financial Resource Strain (CARDIA)    Difficulty of Paying Living Expenses: Not hard at all  Food Insecurity: No Food Insecurity (04/21/2022)   Hunger Vital Sign    Worried About Running Out of Food in the Last Year: Never true    [place] in the Last Year: Never true  Transportation Needs: No Transportation Needs (04/21/2022)   PRAPARE - Hydrologist (Medical): No  Lack of Transportation (Non-Medical): No  Physical Activity: Unknown (04/21/2022)   Exercise Vital Sign    Days of Exercise per Week: Not on file    Minutes of Exercise per Session: 20 min  Stress: No Stress Concern Present (04/21/2022)   [place]s    Feeling of Stress : Only a little  Social Connections: Socially Integrated (04/21/2022)   Social Connection and Isolation Panel [NHANES]    Frequency of Communication with Friends and Family: More than three times a week    Frequency of Social Gatherings with Friends and Family: More  than three times a week    Attends Religious Services: More than 4 times per year    Active Member of [employer] or Organizations: Yes    Attends Music therapist: More than 4 times per year    Marital Status: Married  Human resources officer Violence: Not At Risk (04/21/2022)   Humiliation, Afraid, Rape, and Kick questionnaire    Fear of Current or Ex-Partner: No    Emotionally Abused: No    Physically Abused: No    Sexually Abused: No    FAMILY HISTORY Family History  Problem Relation Age of Onset   Cancer Sister        Has had Breast Cancer and is in remission   Breast cancer Sister 60   Congenital heart disease Son     ALLERGIES:  is allergic to sulfa antibiotics, amoxicillin, and penicillins.  MEDICATIONS:  Current Outpatient Medications  Medication Sig Dispense Refill   amiodarone (PACERONE) 200 MG tablet Take 1 tablet (200 mg total) by mouth 2 (two) times daily. (Patient taking differently: Take 100 mg by mouth daily.) 50 tablet 0   atorvastatin (LIPITOR) 40 MG tablet Take 1 tablet (40 mg total) by mouth daily at 6 PM. 30 tablet 0   levothyroxine (EUTHYROX) 75 MCG tablet Take 1 tablet (75 mcg total) by mouth daily before breakfast. Please schedule office visit before any future refills 90 tablet 0   levothyroxine (SYNTHROID) 75 MCG tablet Take 1 tablet by mouth daily.     metoprolol tartrate (LOPRESSOR) 25 MG tablet Take 1 tablet by mouth daily.     nitroGLYCERIN (NITROSTAT) 0.4 MG SL tablet Place 1 tablet (0.4 mg total) under the tongue every 5 (five) minutes as needed for chest pain. 30 tablet 2   pantoprazole (PROTONIX) 40 MG tablet Take 1 tablet by mouth once daily 90 tablet 0   rivaroxaban (XARELTO) 20 MG TABS tablet Take 1 tablet (20 mg total) by mouth daily with supper. 30 tablet 0   sertraline (ZOLOFT) 100 MG tablet TAKE 1 & 1/2 (ONE & ONE-HALF) TABLETS BY MOUTH ONCE DAILY NEED  TO  SCHEDULE  AN  APPOINTMENT 135 tablet 3   No current facility-administered  medications for this visit.    PHYSICAL EXAMINATION:  ECOG PERFORMANCE STATUS: {CHL ONC ECOG NG:[id number]}   Vitals:   07/28/22 1046  BP: (!) 163/50  Pulse: (!) 50  Resp: 18  Temp: (!) 97.2 F (36.2 C)  SpO2: 99%    Filed Weights   07/28/22 1046  Weight: 149 lb 14.4 oz (68 kg)     Physical Exam Constitutional:      Appearance: Normal appearance.  HENT:     Head: Normocephalic and atraumatic.  Cardiovascular:     Rate and Rhythm: Normal rate.     Heart sounds: No murmur heard. Pulmonary:     Effort: Pulmonary effort is  normal.     Breath sounds: Normal breath sounds.  Abdominal:     Palpations: Abdomen is soft.  Musculoskeletal:     Right lower leg: No edema.     Left lower leg: No edema.  Skin:    General: Skin is warm.  Neurological:     General: No focal deficit present.     Mental Status: She is oriented to person, place, and time.      LABORATORY DATA: I have personally reviewed the data as listed:  Orders Only on 07/28/2022  Component Date Value Ref Range Status   Color, Urine 07/28/2022 YELLOW (A)  YELLOW Final   APPearance 07/28/2022 HAZY (A)  CLEAR Final   Specific Gravity, Urine 07/28/2022 1.010  1.005 - 1.030 Final   pH 07/28/2022 5.0  5.0 - 8.0 Final   Glucose, UA 07/28/2022 NEGATIVE  NEGATIVE mg/dL Final   Hgb urine dipstick 07/28/2022 LARGE (A)  NEGATIVE Final   Bilirubin Urine 07/28/2022 NEGATIVE  NEGATIVE Final   Ketones, ur 07/28/2022 NEGATIVE  NEGATIVE mg/dL Final   Protein, ur 07/28/2022 NEGATIVE  NEGATIVE mg/dL Final   Nitrite 07/28/2022 NEGATIVE  NEGATIVE Final   Leukocytes,Ua 07/28/2022 LARGE (A)  NEGATIVE Final   RBC / HPF 07/28/2022 >50 (H)  0 - 5 RBC/hpf Final   WBC, UA 07/28/2022 >50 (H)  0 - 5 WBC/hpf Final   Bacteria, UA 07/28/2022 MANY (A)  NONE SEEN Final   Squamous Epithelial / LPF 07/28/2022 0-5  0 - 5 Final   Mucus 07/28/2022 PRESENT   Final   Performed at [facility], [address], [place],  [place] [address]    ASSESSMENT/PLAN Ms. Woulfe is a 81 yo F with pmh of Afib, hypothyroidism HTN and HLD who was referred to Hematology for management of iron deficiency anemia.   CBC showed worsening anemia with recent Hb 9.5. MCV 76. WBC and platelets normal. Iron panel from July 2023 showed ferritin 24, saturation 7%. She is reluctant to try oral iron supplements. Reports that her son was previously prescribed oral iron and had very difficult time with constipation.  Feraheme IV weekly x 2 doses. Ordered.  She has positive cologuard test in Oct 2022. Colonoscopy done in Jan 2023 by Dr. Allen Norris showed non bleeding internal hemorrhoids only. Discussed about looking for source of bleeding and recommended upper endoscopy. Placed GI referral and will reach out to Dr. Allen Norris office to schedule it.   UA to rule out microscopic hematuria.   RTC in 6 weeks for MD visit, labs prior.    ADDENDUM-  UA results reviewed and discussed with the patient over the phone. Showed large LE, WBC>50, Hb >50, large HB, bacteria.   Patient has chronic presence of large LE so it is hard to interpret. Prior UA showed blood without evidence of infection. Reports nocturia for at least couple of months but has improved. Denies burning sensation, pain, fevers, frequency. Reports she has had UTIs in the past and right now does not feel anything like that. No indication to treat with antibiotics at this time. Added on urine culture today. Repeat UA next week. In the meantime, placed referral to Urology for microscopic hematuria.   Orders Placed This Encounter  Procedures   Urine Culture    Standing Status:   Future    Number of Occurrences:   1    Standing Expiration Date:   07/29/2023   Urinalysis, Complete w Microscopic    Standing Status:   Future  Number of Occurrences:   1    Standing Expiration Date:   07/29/2023   CBC with Differential    Standing Status:   Future    Standing Expiration Date:   07/28/2023   Iron and  TIBC([facility])   Urinalysis, Complete w Microscopic    Standing Status:   Future    Standing Expiration Date:   07/29/2023   Iron and TIBC([facility])    Standing Status:   Future    Standing Expiration Date:   07/29/2023   Ambulatory referral to Gastroenterology    Referral Priority:   Routine    Referral Type:   Consultation    Referral Reason:   Specialty Services Required    Referred to Provider:   Lucilla Lame, MD    Number of Visits Requested:   1   Ambulatory referral to Urology    Referral Priority:   Routine    Referral Type:   Consultation    Referral Reason:   Specialty Services Required    Requested Specialty:   Urology    Number of Visits Requested:   1    All questions were answered. The patient knows to call the clinic with any problems, questions or concerns.  This note was electronically signed.    Jane Canary, MD  07/28/2022 2:40 PM

## 2022-07-28 NOTE — Progress Notes (Signed)
Patient here today for initial evaluation for anemia. Patient reports fatigue.

## 2022-07-29 ENCOUNTER — Encounter: Payer: Self-pay | Admitting: Internal Medicine

## 2022-07-29 NOTE — Addendum Note (Signed)
Addended byJane Canary on: 07/29/2022 11:02 PM   Modules accepted: Orders

## 2022-07-29 NOTE — Addendum Note (Signed)
Addended byJane Canary on: 07/29/2022 11:16 PM   Modules accepted: Orders

## 2022-07-30 LAB — URINE CULTURE: Culture: >=100000 — AB

## 2022-08-02 ENCOUNTER — Telehealth: Payer: Self-pay | Admitting: Gastroenterology

## 2022-08-02 ENCOUNTER — Telehealth: Payer: Self-pay | Admitting: *Deleted

## 2022-08-02 NOTE — Telephone Encounter (Signed)
Call placed to [facility] regarding need for sooner appointment per Dr. Darrall Dears. Referral entered on 8/2. Left vm with GI regarding need for patient to be seen prior to December due to anemia.   Call placed to [facility] to follow up on referral placed 8/2. Left vm for referral coordinator.   Call received from [facility], referral was received and office has reached out to the patient for scheduling.

## 2022-08-02 NOTE — Telephone Encounter (Signed)
Tara Marquez from [facility]. Dr Darrall Dears, Bridget Hartshorn is wanting the patient to be seen sooner than December due to her anemia. Requesting call back at [phone].

## 2022-08-04 NOTE — Addendum Note (Signed)
Addended byJane Canary on: 08/04/2022 01:17 PM   Modules accepted: Orders

## 2022-08-05 ENCOUNTER — Inpatient Hospital Stay: Payer: Medicare Other

## 2022-08-05 VITALS — BP 131/54 | HR 53 | Temp 96.4°F | Resp 20

## 2022-08-05 DIAGNOSIS — D509 Iron deficiency anemia, unspecified: Secondary | ICD-10-CM | POA: Diagnosis not present

## 2022-08-05 DIAGNOSIS — R0602 Shortness of breath: Secondary | ICD-10-CM | POA: Diagnosis not present

## 2022-08-05 DIAGNOSIS — Z7901 Long term (current) use of anticoagulants: Secondary | ICD-10-CM | POA: Diagnosis not present

## 2022-08-05 DIAGNOSIS — R079 Chest pain, unspecified: Secondary | ICD-10-CM | POA: Diagnosis not present

## 2022-08-05 DIAGNOSIS — R3129 Other microscopic hematuria: Secondary | ICD-10-CM

## 2022-08-05 DIAGNOSIS — I4891 Unspecified atrial fibrillation: Secondary | ICD-10-CM | POA: Diagnosis not present

## 2022-08-05 DIAGNOSIS — R5383 Other fatigue: Secondary | ICD-10-CM | POA: Diagnosis not present

## 2022-08-05 LAB — URINALYSIS, COMPLETE (UACMP) WITH MICROSCOPIC
Bilirubin Urine: NEGATIVE
Glucose, UA: NEGATIVE mg/dL
Ketones, ur: NEGATIVE mg/dL
Nitrite: NEGATIVE
Protein, ur: 30 mg/dL — AB
RBC / HPF: >50 RBC/hpf — ABNORMAL HIGH (ref 0–5)
Specific Gravity, Urine: 1.008 (ref 1.005–1.030)
Squamous Epithelial / HPF: NONE SEEN (ref 0–5)
WBC, UA: >50 WBC/hpf — ABNORMAL HIGH (ref 0–5)
pH: 5 (ref 5.0–8.0)

## 2022-08-05 MED ORDER — SODIUM CHLORIDE 0.9 % IV SOLN
510.0000 mg | Freq: Once | INTRAVENOUS | Status: AC
  Administered 2022-08-05: 510 mg via INTRAVENOUS
  Filled 2022-08-05: qty 510

## 2022-08-05 MED ORDER — SODIUM CHLORIDE 0.9 % IV SOLN
Freq: Once | INTRAVENOUS | Status: AC
  Filled 2022-08-05: qty 250

## 2022-08-10 NOTE — Telephone Encounter (Signed)
Pt appt moved to 10/3

## 2022-08-12 ENCOUNTER — Inpatient Hospital Stay: Payer: Medicare Other

## 2022-08-12 VITALS — BP 143/56 | HR 70 | Temp 97.2°F | Resp 17

## 2022-08-12 DIAGNOSIS — Z7901 Long term (current) use of anticoagulants: Secondary | ICD-10-CM | POA: Diagnosis not present

## 2022-08-12 DIAGNOSIS — D509 Iron deficiency anemia, unspecified: Secondary | ICD-10-CM

## 2022-08-12 DIAGNOSIS — R5383 Other fatigue: Secondary | ICD-10-CM | POA: Diagnosis not present

## 2022-08-12 DIAGNOSIS — R079 Chest pain, unspecified: Secondary | ICD-10-CM | POA: Diagnosis not present

## 2022-08-12 DIAGNOSIS — R0602 Shortness of breath: Secondary | ICD-10-CM | POA: Diagnosis not present

## 2022-08-12 DIAGNOSIS — I4891 Unspecified atrial fibrillation: Secondary | ICD-10-CM | POA: Diagnosis not present

## 2022-08-12 MED ORDER — SODIUM CHLORIDE 0.9 % IV SOLN
Freq: Once | INTRAVENOUS | Status: AC
  Filled 2022-08-12: qty 250

## 2022-08-12 MED ORDER — SODIUM CHLORIDE 0.9 % IV SOLN
510.0000 mg | Freq: Once | INTRAVENOUS | Status: AC
  Administered 2022-08-12: 510 mg via INTRAVENOUS
  Filled 2022-08-12: qty 510

## 2022-08-12 MED ORDER — SODIUM CHLORIDE 0.9% FLUSH
10.0000 mL | Freq: Once | INTRAVENOUS | Status: AC | PRN
  Administered 2022-08-12: 10 mL
  Filled 2022-08-12: qty 10

## 2022-08-12 NOTE — Patient Instructions (Signed)

## 2022-08-12 NOTE — Progress Notes (Signed)
Patient tolerated Feraheme infusion well, no questions/concerns voiced. Patient stable at discharge. AVS given.

## 2022-08-18 ENCOUNTER — Encounter: Payer: Self-pay | Admitting: Urology

## 2022-08-18 ENCOUNTER — Ambulatory Visit (INDEPENDENT_AMBULATORY_CARE_PROVIDER_SITE_OTHER): Payer: Medicare Other | Admitting: Urology

## 2022-08-18 VITALS — BP 149/78 | HR 70 | Ht 65.0 in | Wt 140.0 lb

## 2022-08-18 DIAGNOSIS — R310 Gross hematuria: Secondary | ICD-10-CM | POA: Diagnosis not present

## 2022-08-18 DIAGNOSIS — R8271 Bacteriuria: Secondary | ICD-10-CM

## 2022-08-18 NOTE — Progress Notes (Unsigned)
08/18/2022 2:50 PM   Tara Marquez May 03, 1941 [id number]  Referring provider: Jane Canary, MD [address] [place],  [place] [address]  Chief Complaint  Patient presents with   Hematuria    HPI: Tara Marquez is a 81 y.o. female referred for evaluation of microhematuria  Followed by hematology for iron deficiency anemia UA 8/2 and 08/05/2022 showed >50 RBC/WBC and many bacteria Urine culture 07/28/2022 positive for Klebsiella She has noted coffee-colored urine in the past Presently asymptomatic Denies flank, abdominal or pelvic pain On anticoagulation for A-fib with Xarelto  PMH: Past Medical History:  Diagnosis Date   A-fib ([place])    Cancer ([place])    skin ca   Depression    mild major depression   GERD (gastroesophageal reflux disease)    Hyperlipidemia    Hypertension    Hypothyroidism     Surgical History: Past Surgical History:  Procedure Laterality Date   ABDOMINAL HYSTERECTOMY  03/1971   Status Post Hysterectomy   CATARACT EXTRACTION Left    COLONOSCOPY     2010   COLONOSCOPY WITH PROPOFOL N/A 01/12/2022   Procedure: COLONOSCOPY WITH PROPOFOL;  Surgeon: Lucilla Lame, MD;  Location: [facility] ENDOSCOPY;  Service: Endoscopy;  Laterality: N/A;   EYE SURGERY     TONSILLECTOMY     TONSILLECTOMY AND ADENOIDECTOMY  1951   WISDOM TOOTH EXTRACTION  1970    Home Medications:  Allergies as of 08/18/2022       Reactions   Sulfa Antibiotics Hives   Amoxicillin Rash   Penicillins Rash        Medication List        Accurate as of August 18, 2022  2:50 PM. If you have any questions, ask your nurse or doctor.          amiodarone 200 MG tablet Commonly known as: Pacerone Take 1 tablet (200 mg total) by mouth 2 (two) times daily. What changed:  how much to take when to take this   atorvastatin 40 MG tablet Commonly known as: LIPITOR Take 1 tablet (40 mg total) by mouth daily at 6 PM.   levothyroxine 75 MCG tablet Commonly known as:  SYNTHROID Take 1 tablet by mouth daily.   levothyroxine 75 MCG tablet Commonly known as: Euthyrox Take 1 tablet (75 mcg total) by mouth daily before breakfast. Please schedule office visit before any future refills   metoprolol tartrate 25 MG tablet Commonly known as: LOPRESSOR Take 1 tablet by mouth daily.   nitroGLYCERIN 0.4 MG SL tablet Commonly known as: NITROSTAT Place 1 tablet (0.4 mg total) under the tongue every 5 (five) minutes as needed for chest pain.   pantoprazole 40 MG tablet Commonly known as: PROTONIX Take 1 tablet by mouth once daily   rivaroxaban 20 MG Tabs tablet Commonly known as: XARELTO Take 1 tablet (20 mg total) by mouth daily with supper.   sertraline 100 MG tablet Commonly known as: ZOLOFT TAKE 1 & 1/2 (ONE & ONE-HALF) TABLETS BY MOUTH ONCE DAILY NEED  TO  SCHEDULE  AN  APPOINTMENT        Allergies:  Allergies  Allergen Reactions   Sulfa Antibiotics Hives   Amoxicillin Rash   Penicillins Rash    Family History: Family History  Problem Relation Age of Onset   Cancer Sister        Has had Breast Cancer and is in remission   Breast cancer Sister 34   Congenital heart disease Son  Social History:  reports that she has never smoked. She has never used smokeless tobacco. She reports that she does not drink alcohol and does not use drugs.   Physical Exam: BP (!) 149/78   Pulse 70   Ht '5\' 5"'$  (1.651 m)   Wt 140 lb (63.5 kg)   BMI 23.30 kg/m   Constitutional:  Alert and oriented, No acute distress. HEENT: [place] AT, moist mucus membranes.  Trachea midline, no masses. Cardiovascular: No clubbing, cyanosis, or edema. Respiratory: Normal respiratory effort, no increased work of breathing. GI: Abdomen is soft, nontender, nondistended, no abdominal masses GU: No CVA tenderness Skin: No rashes, bruises or suspicious lesions. Neurologic: Grossly intact, no focal deficits, moving all 4 extremities. Psychiatric: Normal mood and  affect.  Laboratory Data:  Urinalysis Dipstick 1+ bili/trace ketones/3+ blood/2+ protein/1+ leukocytes Microscopy >30 WBC/11-30 RBC/many bacteria and no significant epis  Assessment & Plan:    1. Gross hematuria She has asymptomatic bacteriuria however has noted gross hematuria at times We discussed the evaluation of gross hematuria which includes CT urogram and cystoscopy.  Both procedures were discussed in detail and she has elected to proceed  2.  Asymptomatic bacteriuria Urine culture ordered today If positive will treat prior to cystoscopy    Abbie Sons, MD  [place] [address] [place], [place] [address] [phone]

## 2022-08-19 ENCOUNTER — Other Ambulatory Visit: Payer: Medicare Other

## 2022-08-19 ENCOUNTER — Encounter: Payer: Self-pay | Admitting: Urology

## 2022-08-19 DIAGNOSIS — R310 Gross hematuria: Secondary | ICD-10-CM

## 2022-08-19 LAB — URINALYSIS, COMPLETE
Bilirubin, UA: NEGATIVE
Bilirubin, UA: NEGATIVE
Glucose, UA: NEGATIVE
Nitrite, UA: NEGATIVE
Nitrite, UA: NEGATIVE
Specific Gravity, UA: 1.02 (ref 1.005–1.030)
Specific Gravity, UA: 1.02 (ref 1.005–1.030)
Urobilinogen, Ur: 1 mg/dL (ref 0.2–1.0)
Urobilinogen, Ur: 1 mg/dL (ref 0.2–1.0)
pH, UA: 5 (ref 5.0–7.5)
pH, UA: 5.5 (ref 5.0–7.5)

## 2022-08-19 LAB — MICROSCOPIC EXAMINATION
RBC, Urine: >30 /hpf — AB (ref 0–2)
WBC, UA: >30 /hpf — AB (ref 0–5)
WBC, UA: >30 /hpf — AB (ref 0–5)

## 2022-08-22 LAB — CULTURE, URINE COMPREHENSIVE

## 2022-08-27 ENCOUNTER — Ambulatory Visit
Admission: RE | Admit: 2022-08-27 | Discharge: 2022-08-27 | Disposition: A | Discharge status: Home / Self Care | Payer: Medicare Other | Source: Ambulatory Visit | Attending: Urology | Admitting: Urology

## 2022-08-27 DIAGNOSIS — N200 Calculus of kidney: Secondary | ICD-10-CM | POA: Diagnosis not present

## 2022-08-27 DIAGNOSIS — N2889 Other specified disorders of kidney and ureter: Secondary | ICD-10-CM | POA: Diagnosis not present

## 2022-08-27 DIAGNOSIS — R310 Gross hematuria: Secondary | ICD-10-CM

## 2022-08-27 LAB — POCT I-STAT CREATININE: Creatinine, Ser: 1.2 mg/dL — ABNORMAL HIGH (ref 0.44–1.00)

## 2022-08-27 MED ORDER — IOHEXOL 300 MG/ML  SOLN
100.0000 mL | Freq: Once | INTRAMUSCULAR | Status: AC | PRN
  Administered 2022-08-27: 100 mL via INTRAVENOUS

## 2022-08-31 ENCOUNTER — Telehealth: Payer: Self-pay | Admitting: *Deleted

## 2022-08-31 NOTE — Telephone Encounter (Signed)
Notified patient as instructed, patient pleased °

## 2022-08-31 NOTE — Telephone Encounter (Addendum)
-----   Message from Abbie Sons, MD sent at 08/29/2022  7:47 PM EDT ----- Urine culture was positive but she was not having symptoms so this is consistent with colonization.  Will send in an antibiotic for her to start 1 week prior to cystoscopy.  Will wait until 1 week prior before it is sent.

## 2022-09-03 ENCOUNTER — Other Ambulatory Visit: Payer: Self-pay | Admitting: *Deleted

## 2022-09-03 DIAGNOSIS — D509 Iron deficiency anemia, unspecified: Secondary | ICD-10-CM

## 2022-09-06 ENCOUNTER — Inpatient Hospital Stay: Payer: Medicare Other | Attending: Internal Medicine

## 2022-09-06 DIAGNOSIS — D509 Iron deficiency anemia, unspecified: Secondary | ICD-10-CM | POA: Insufficient documentation

## 2022-09-06 LAB — CBC WITH DIFFERENTIAL/PLATELET
Abs Immature Granulocytes: 0.02 10*3/uL (ref 0.00–0.07)
Basophils Absolute: 0.1 10*3/uL (ref 0.0–0.1)
Basophils Relative: 1 %
Eosinophils Absolute: 0.1 10*3/uL (ref 0.0–0.5)
Eosinophils Relative: 2 %
HCT: 38.2 % (ref 36.0–46.0)
Hemoglobin: 11.9 g/dL — ABNORMAL LOW (ref 12.0–15.0)
Immature Granulocytes: 0 %
Lymphocytes Relative: 18 %
Lymphs Abs: 1 10*3/uL (ref 0.7–4.0)
MCH: 26.3 pg (ref 26.0–34.0)
MCHC: 31.2 g/dL (ref 30.0–36.0)
MCV: 84.3 fL (ref 80.0–100.0)
Monocytes Absolute: 0.4 10*3/uL (ref 0.1–1.0)
Monocytes Relative: 7 %
Neutro Abs: 4.1 10*3/uL (ref 1.7–7.7)
Neutrophils Relative %: 72 %
Platelets: 150 10*3/uL (ref 150–400)
RBC: 4.53 MIL/uL (ref 3.87–5.11)
RDW: 20.6 % — ABNORMAL HIGH (ref 11.5–15.5)
WBC: 5.6 10*3/uL (ref 4.0–10.5)
nRBC: 0 % (ref 0.0–0.2)

## 2022-09-06 LAB — IRON AND TIBC
Iron: 70 ug/dL (ref 28–170)
Saturation Ratios: 19 % (ref 10.4–31.8)
TIBC: 371 ug/dL (ref 250–450)
UIBC: 301 ug/dL

## 2022-09-06 LAB — FERRITIN: Ferritin: 146 ng/mL (ref 11–307)

## 2022-09-09 ENCOUNTER — Inpatient Hospital Stay (HOSPITAL_BASED_OUTPATIENT_CLINIC_OR_DEPARTMENT_OTHER): Payer: Medicare Other | Admitting: Internal Medicine

## 2022-09-09 ENCOUNTER — Encounter: Payer: Self-pay | Admitting: Internal Medicine

## 2022-09-09 VITALS — BP 121/90 | HR 57 | Temp 97.9°F | Resp 16 | Wt 145.8 lb

## 2022-09-09 DIAGNOSIS — D500 Iron deficiency anemia secondary to blood loss (chronic): Secondary | ICD-10-CM | POA: Diagnosis not present

## 2022-09-09 DIAGNOSIS — D509 Iron deficiency anemia, unspecified: Secondary | ICD-10-CM | POA: Diagnosis not present

## 2022-09-09 NOTE — Progress Notes (Signed)
Pt reports energy levels were better after having infusions last month. Reports recently had a kidney stone and continues to have hematuria.

## 2022-09-09 NOTE — Progress Notes (Signed)
[facility] Initial Visit:  Patient Care Team: Jerrol Banana., MD as PCP - General (Family Medicine) Birder Robson, MD as Referring Physician (Ophthalmology) Corey Skains, MD as Consulting Physician (Cardiology) Oneta Rack, MD as Consulting Physician (Dermatology)  CHIEF COMPLAINTS/PURPOSE OF CONSULTATION:  Oncology History   No history exists.    HISTORY OF PRESENTING ILLNESS: Tara Marquez 81 y.o. female is here because of iron deficiency anemia.  Patient reports feeling more fatigued and getting sob on exertion. She has not been able to do much yard work this summer. Reports occasional chest pain. She has been having issues with uncontrolled afib and has been closely following with cardiology. She is on xarelto for afib. She reports unintentional weight loss of about 14 lbs in past 5 months. Her appetite is not as good.   She has positive cologaurd test in Oct 2022. Colonoscopy done in Jan 2023 showed non bleeding internal hemorrhoids only. CBC showed worsening anemia with recent Hb 9.5. MCV 76. WBC and platelets normal. Iron panel from July 2023 showed ferritin 24, saturation 7%. She is reluctant to try oral iron supplements. Reports that her son was previously prescribed oral iron and had very difficult time with constipation. Denies any recent gum or nose bleeding or change in urine color or black tarry stools.   INTERVAL HISTORY-  Patient seen today after 2 infusions of Feraheme.  She tolerated infusions well.  She reports improvement in her energy levels.  Reports her urine has been getting darker.  Otherwise denies any bleeding. Patient denies fever, chills, nausea, vomiting, shortness of breath, cough, abdominal pain.  Energy level is good.  Appetite is good.  Denies any weight loss. Denies pain.  Review of Systems - Oncology Positive ROS as above Rest 10 points ROS negative  MEDICAL HISTORY: Past Medical History:  Diagnosis Date    A-fib ([place])    Cancer ([place])    skin ca   Depression    mild major depression   GERD (gastroesophageal reflux disease)    Hyperlipidemia    Hypertension    Hypothyroidism     SURGICAL HISTORY: Past Surgical History:  Procedure Laterality Date   ABDOMINAL HYSTERECTOMY  03/1971   Status Post Hysterectomy   CATARACT EXTRACTION Left    COLONOSCOPY     2010   COLONOSCOPY WITH PROPOFOL N/A 01/12/2022   Procedure: COLONOSCOPY WITH PROPOFOL;  Surgeon: Lucilla Lame, MD;  Location: [facility] ENDOSCOPY;  Service: Endoscopy;  Laterality: N/A;   EYE SURGERY     TONSILLECTOMY     TONSILLECTOMY AND ADENOIDECTOMY  1951   WISDOM TOOTH EXTRACTION  1970    SOCIAL HISTORY: Social History   Socioeconomic History   Marital status: Married    Spouse name: Not on file   Number of children: 2   Years of education: Not on file   Highest education level: Some college, no degree  Occupational History   Occupation: retired  Tobacco Use   Smoking status: Never   Smokeless tobacco: Never  Vaping Use   Vaping Use: Never used  Substance and Sexual Activity   Alcohol use: No   Drug use: No   Sexual activity: Yes  Other Topics Concern   Not on file  Social History Narrative   Not on file   Social Determinants of Health   Financial Resource Strain: Low Risk  (04/21/2022)   Overall Financial Resource Strain (CARDIA)    Difficulty of Paying Living Expenses: Not hard  at all  Food Insecurity: No Food Insecurity (04/21/2022)   Hunger Vital Sign    Worried About Running Out of Food in the Last Year: Never true    Ran Out of Food in the Last Year: Never true  Transportation Needs: No Transportation Needs (04/21/2022)   PRAPARE - Hydrologist (Medical): No    Lack of Transportation (Non-Medical): No  Physical Activity: Unknown (04/21/2022)   Exercise Vital Sign    Days of Exercise per Week: Not on file    Minutes of Exercise per Session: 20 min  Stress: No Stress Concern  Present (04/21/2022)   [place]    Feeling of Stress : Only a little  Social Connections: Socially Integrated (04/21/2022)   Social Connection and Isolation Panel [NHANES]    Frequency of Communication with Friends and Family: More than three times a week    Frequency of Social Gatherings with Friends and Family: More than three times a week    Attends Religious Services: More than 4 times per year    Active Member of [employer] or Organizations: Yes    Attends Music therapist: More than 4 times per year    Marital Status: Married  Human resources officer Violence: Not At Risk (04/21/2022)   Humiliation, Afraid, Rape, and Kick questionnaire    Fear of Current or Ex-Partner: No    Emotionally Abused: No    Physically Abused: No    Sexually Abused: No    FAMILY HISTORY Family History  Problem Relation Age of Onset   Cancer Sister        Has had Breast Cancer and is in remission   Breast cancer Sister 18   Congenital heart disease Son     ALLERGIES:  is allergic to sulfa antibiotics, amoxicillin, and penicillins.  MEDICATIONS:  Current Outpatient Medications  Medication Sig Dispense Refill   amiodarone (PACERONE) 200 MG tablet Take 1 tablet (200 mg total) by mouth 2 (two) times daily. (Patient taking differently: Take 100 mg by mouth daily.) 50 tablet 0   atorvastatin (LIPITOR) 40 MG tablet Take 1 tablet (40 mg total) by mouth daily at 6 PM. 30 tablet 0   levothyroxine (EUTHYROX) 75 MCG tablet Take 1 tablet (75 mcg total) by mouth daily before breakfast. Please schedule office visit before any future refills 90 tablet 0   levothyroxine (SYNTHROID) 75 MCG tablet Take 1 tablet by mouth daily.     metoprolol tartrate (LOPRESSOR) 25 MG tablet Take 1 tablet by mouth daily.     rivaroxaban (XARELTO) 20 MG TABS tablet Take 1 tablet (20 mg total) by mouth daily with supper. 30 tablet 0   sertraline (ZOLOFT) 100 MG tablet  TAKE 1 & 1/2 (ONE & ONE-HALF) TABLETS BY MOUTH ONCE DAILY NEED  TO  SCHEDULE  AN  APPOINTMENT 135 tablet 3   nitroGLYCERIN (NITROSTAT) 0.4 MG SL tablet Place 1 tablet (0.4 mg total) under the tongue every 5 (five) minutes as needed for chest pain. (Patient not taking: Reported on 09/09/2022) 30 tablet 2   No current facility-administered medications for this visit.    PHYSICAL EXAMINATION:  ECOG PERFORMANCE STATUS: 2 - Symptomatic, <50% confined to bed   Vitals:   09/09/22 1350  BP: (!) 121/90  Pulse: (!) 57  Resp: 16  Temp: 97.9 F (36.6 C)  SpO2: 98%    Filed Weights   09/09/22 1350  Weight: 145 lb 12.8 oz (  66.1 kg)     Physical Exam Constitutional:      Appearance: Normal appearance.  HENT:     Head: Normocephalic and atraumatic.  Cardiovascular:     Rate and Rhythm: Normal rate.     Heart sounds: No murmur heard. Pulmonary:     Effort: Pulmonary effort is normal.     Breath sounds: Normal breath sounds.  Abdominal:     Palpations: Abdomen is soft.  Musculoskeletal:     Right lower leg: No edema.     Left lower leg: No edema.  Skin:    General: Skin is warm.  Neurological:     General: No focal deficit present.     Mental Status: She is oriented to person, place, and time.      LABORATORY DATA: I have personally reviewed the data as listed:  Appointment on 09/06/2022  Component Date Value Ref Range Status   Ferritin 09/06/2022 146  11 - 307 ng/mL Final   Performed at [facility], [address] [place], [place] [address]   Iron 09/06/2022 70  28 - 170 ug/dL Final   TIBC 09/06/2022 371  250 - 450 ug/dL Final   Saturation Ratios 09/06/2022 19  10.4 - 31.8 % Final   UIBC 09/06/2022 301  ug/dL Final   Performed at [facility], [address] [place], [place] [address]   WBC 09/06/2022 5.6  4.0 - 10.5 K/uL Final   RBC 09/06/2022 4.53  3.87 - 5.11 MIL/uL Final   Hemoglobin 09/06/2022 11.9 (L)  12.0 - 15.0 g/dL Final   HCT  09/06/2022 38.2  36.0 - 46.0 % Final   MCV 09/06/2022 84.3  80.0 - 100.0 fL Final   MCH 09/06/2022 26.3  26.0 - 34.0 pg Final   MCHC 09/06/2022 31.2  30.0 - 36.0 g/dL Final   RDW 09/06/2022 20.6 (H)  11.5 - 15.5 % Final   Platelets 09/06/2022 150  150 - 400 K/uL Final   nRBC 09/06/2022 0.0  0.0 - 0.2 % Final   Neutrophils Relative % 09/06/2022 72  % Final   Neutro Abs 09/06/2022 4.1  1.7 - 7.7 K/uL Final   Lymphocytes Relative 09/06/2022 18  % Final   Lymphs Abs 09/06/2022 1.0  0.7 - 4.0 K/uL Final   Monocytes Relative 09/06/2022 7  % Final   Monocytes Absolute 09/06/2022 0.4  0.1 - 1.0 K/uL Final   Eosinophils Relative 09/06/2022 2  % Final   Eosinophils Absolute 09/06/2022 0.1  0.0 - 0.5 K/uL Final   Basophils Relative 09/06/2022 1  % Final   Basophils Absolute 09/06/2022 0.1  0.0 - 0.1 K/uL Final   Immature Granulocytes 09/06/2022 0  % Final   Abs Immature Granulocytes 09/06/2022 0.02  0.00 - 0.07 K/uL Final   Performed at [facility], [address] [place], [place] [address]  Hospital Outpatient Visit on 08/27/2022  Component Date Value Ref Range Status   Creatinine, Ser 08/27/2022 1.20 (H)  0.44 - 1.00 mg/dL Final  Appointment on 08/19/2022  Component Date Value Ref Range Status   Specific Gravity, UA 08/19/2022 1.020  1.005 - 1.030 Final   pH, UA 08/19/2022 5.5  5.0 - 7.5 Final   Color, UA 08/19/2022 Red (A)  Yellow Final   Appearance Ur 08/19/2022 Cloudy (A)  Clear Final   Leukocytes,UA 08/19/2022 1+ (A)  Negative Final   Protein,UA 08/19/2022 2+ (A)  Negative/Trace Final   Glucose, UA 08/19/2022 Trace (A)  Negative Final  Ketones, UA 08/19/2022 Trace (A)  Negative Final   RBC, UA 08/19/2022 3+ (A)  Negative Final   Bilirubin, UA 08/19/2022 Negative  Negative Final   Urobilinogen, Ur 08/19/2022 1.0  0.2 - 1.0 mg/dL Final   Nitrite, UA 08/19/2022 Negative  Negative Final   Microscopic Examination 08/19/2022 See below:   Final   Urine Culture, Comprehensive  08/19/2022 Final report (A)   Final   Organism ID, Bacteria 08/19/2022 Klebsiella pneumoniae (A)   Final   Comment: Cefazolin <=4 ug/mL Cefazolin with an MIC <=16 predicts susceptibility to the oral agents cefaclor, cefdinir, cefpodoxime, cefprozil, cefuroxime, cephalexin, and loracarbef when used for therapy of uncomplicated urinary tract infections due to E. coli, Klebsiella pneumoniae, and Proteus mirabilis. Greater than 100,000 colony forming units per mL    Organism ID, Bacteria [id number] Not applicable   Final   ANTIMICROBIAL SUSCEPTIBILITY 08/19/2022 Comment   Final   Comment:       ** S = Susceptible; I = Intermediate; R = Resistant **                    P = Positive; N = Negative             MICS are expressed in micrograms per mL    Antibiotic                 RSLT#1    RSLT#2    RSLT#3    RSLT#4 Amoxicillin/Clavulanic Acid    S Ampicillin                     R Cefepime                       S Ceftriaxone                    S Cefuroxime                     S Ciprofloxacin                  S Ertapenem                      S Gentamicin                     S Imipenem                       S Levofloxacin                   S Meropenem                      S Nitrofurantoin                 R Piperacillin/Tazobactam        S Tetracycline                   S Tobramycin                     S Trimethoprim/Sulfa             S    WBC, UA 08/19/2022 >30 (A)  0 - 5 /hpf Final   RBC, Urine 08/19/2022 >30 (A)  0 - 2 /hpf Final   Epithelial Cells (non renal) 08/19/2022 0-10  0 - 10 /  hpf Final   Bacteria, UA 08/19/2022 Many (A)  None seen/Few Final  Office Visit on 08/18/2022  Component Date Value Ref Range Status   Specific Gravity, UA 08/18/2022 1.020  1.005 - 1.030 Final   pH, UA 08/18/2022 5.0  5.0 - 7.5 Final   Color, UA 08/18/2022 Yellow  Yellow Final   Appearance Ur 08/18/2022 Cloudy (A)  Clear Final   Leukocytes,UA 08/18/2022 1+ (A)  Negative Final   Protein,UA 08/18/2022  2+ (A)  Negative/Trace Final   Glucose, UA 08/18/2022 Negative  Negative Final   Ketones, UA 08/18/2022 Trace (A)  Negative Final   RBC, UA 08/18/2022 3+ (A)  Negative Final   Bilirubin, UA 08/18/2022 Negative  Negative Final   Urobilinogen, Ur 08/18/2022 1.0  0.2 - 1.0 mg/dL Final   Nitrite, UA 08/18/2022 Negative  Negative Final   Microscopic Examination 08/18/2022 See below:   Final   WBC, UA 08/18/2022 >30 (A)  0 - 5 /hpf Final   RBC, Urine 08/18/2022 11-30 (A)  0 - 2 /hpf Final   Epithelial Cells (non renal) 08/18/2022 0-10  0 - 10 /hpf Final   Casts 08/18/2022 Present (A)  None seen /lpf Final   Cast Type 08/18/2022 Hyaline casts  N/A Final   Bacteria, UA 08/18/2022 Many (A)  None seen/Few Final    ASSESSMENT/PLAN Ms. Altland is a 81 yo F with pmh of Afib, hypothyroidism HTN and HLD who was referred to Hematology for management of iron deficiency anemia.   #Iron deficiency anemia -Status post 2 Feraheme infusions in August 2023. -Repeat CBC, iron panel 6 weeks later infusion showed improvement in hemoglobin and normal iron stores.  She has been feeling better with improved energy levels. -Colonoscopy in January 2023 was negative. -She is scheduled for endoscopy in October 2023 with Dr. Allen Norris -On UA, she was found to have microscopic hematuria.  She has been following with Dr. Bernardo Heater.  CT abdomen with and without contrast showed bilateral nephrolithiasis with largest kidney stone in the inferior pole measuring 8 mm and 2 punctate stones identified within the upper and lower pole of right kidney.  No bladder calculi or focal lesions were noted.  She is planned for cystoscopy next week. -I will follow-up with her in 8 weeks to recheck her hemoglobin and iron stores and possible iron infusions.  RTC in 8 weeks for MD visit, labs prior.  Orders Placed This Encounter  Procedures   CBC with Differential    Standing Status:   Future    Standing Expiration Date:   09/09/2023   Ferritin     Standing Status:   Future    Standing Expiration Date:   09/10/2023   Iron and TIBC([facility])    Standing Status:   Future    Standing Expiration Date:   09/10/2023    All questions were answered. The patient knows to call the clinic with any problems, questions or concerns.  This note was electronically signed.    Jane Canary, MD  09/09/2022 3:06 PM

## 2022-09-10 ENCOUNTER — Other Ambulatory Visit: Payer: Self-pay | Admitting: *Deleted

## 2022-09-10 MED ORDER — DOXYCYCLINE HYCLATE 100 MG PO TABS: 100.0000 mg | ORAL_TABLET | Freq: Two times a day (BID) | ORAL | 0 refills | Status: AC

## 2022-09-15 ENCOUNTER — Other Ambulatory Visit: Payer: Self-pay | Admitting: Urology

## 2022-09-16 ENCOUNTER — Ambulatory Visit (INDEPENDENT_AMBULATORY_CARE_PROVIDER_SITE_OTHER): Payer: Medicare Other | Admitting: Family Medicine

## 2022-09-16 ENCOUNTER — Encounter: Payer: Self-pay | Admitting: Family Medicine

## 2022-09-16 VITALS — BP 138/51 | HR 57 | Resp 16 | Wt 145.0 lb

## 2022-09-16 DIAGNOSIS — Z23 Encounter for immunization: Secondary | ICD-10-CM

## 2022-09-16 DIAGNOSIS — F329 Major depressive disorder, single episode, unspecified: Secondary | ICD-10-CM | POA: Diagnosis not present

## 2022-09-16 DIAGNOSIS — I10 Essential (primary) hypertension: Secondary | ICD-10-CM | POA: Diagnosis not present

## 2022-09-16 DIAGNOSIS — R634 Abnormal weight loss: Secondary | ICD-10-CM | POA: Diagnosis not present

## 2022-09-16 DIAGNOSIS — I7123 Aneurysm of the descending thoracic aorta, without rupture: Secondary | ICD-10-CM | POA: Diagnosis not present

## 2022-09-16 DIAGNOSIS — I480 Paroxysmal atrial fibrillation: Secondary | ICD-10-CM

## 2022-09-16 DIAGNOSIS — E039 Hypothyroidism, unspecified: Secondary | ICD-10-CM | POA: Diagnosis not present

## 2022-09-16 DIAGNOSIS — K219 Gastro-esophageal reflux disease without esophagitis: Secondary | ICD-10-CM | POA: Diagnosis not present

## 2022-09-16 NOTE — Progress Notes (Deleted)
Established patient visit   Patient: Tara Marquez   DOB: July 07, 1941   81 y.o. Female  MRN: [id number] Visit Date: 09/16/2022  Today's healthcare provider: [name], MD   No chief complaint on file.  Subjective    HPI  Hypertension, follow-up  BP Readings from Last 3 Encounters:  09/09/22 (!) 121/90  08/18/22 (!) 149/78  08/12/22 (!) 143/56   Wt Readings from Last 3 Encounters:  09/09/22 145 lb 12.8 oz (66.1 kg)  08/18/22 140 lb (63.5 kg)  07/28/22 149 lb 14.4 oz (68 kg)     She was last seen for hypertension 2 months ago.  BP at that visit was as above. Management since that visit includes asking patient to keep record of home readings.  She reports {excellent/good/fair/poor:19665} compliance with treatment. She {is/is not:9024} having side effects. {document side effects if present:1} She is following a {diet:21022986} diet. She {is/is not:9024} exercising. She {does/does not:200015} smoke.  Use of agents associated with hypertension: {bp agents assoc with hypertension:511::"none"}.   Outside blood pressures are {***enter patient reported home BP readings, or 'not being checked':1}. Symptoms: {Yes/No:20286} chest pain {Yes/No:20286} chest pressure  {Yes/No:20286} palpitations {Yes/No:20286} syncope  {Yes/No:20286} dyspnea {Yes/No:20286} orthopnea  {Yes/No:20286} paroxysmal nocturnal dyspnea {Yes/No:20286} lower extremity edema   Pertinent labs Lab Results  Component Value Date   CHOL 135 06/11/2021   HDL 50 06/11/2021   LDLCALC 67 06/11/2021   TRIG 93 06/11/2021   CHOLHDL 2.7 06/11/2021   Lab Results  Component Value Date   NA 142 04/26/2022   K 4.2 04/26/2022   CREATININE 1.20 (H) 08/27/2022   EGFR 55 (L) 04/26/2022   GLUCOSE 99 04/26/2022   TSH 2.090 04/26/2022     The ASCVD Risk score (Arnett DK, et al., 2019) failed to calculate for the following reasons:   The 2019 ASCVD risk score is only valid for ages 25 to  11  --------------------------------------------------------------------------------------------------- Weight Loss  Patient is also here to follow up on her gradual unintentional weight loss.    Medications: Outpatient Medications Prior to Visit  Medication Sig   amiodarone (PACERONE) 200 MG tablet Take 1 tablet (200 mg total) by mouth 2 (two) times daily. (Patient taking differently: Take 100 mg by mouth daily.)   atorvastatin (LIPITOR) 40 MG tablet Take 1 tablet (40 mg total) by mouth daily at 6 PM.   doxycycline (VIBRA-TABS) 100 MG tablet Take 1 tablet (100 mg total) by mouth 2 (two) times daily for 10 days.   levothyroxine (EUTHYROX) 75 MCG tablet Take 1 tablet (75 mcg total) by mouth daily before breakfast. Please schedule office visit before any future refills   levothyroxine (SYNTHROID) 75 MCG tablet Take 1 tablet by mouth daily.   metoprolol tartrate (LOPRESSOR) 25 MG tablet Take 1 tablet by mouth daily.   nitroGLYCERIN (NITROSTAT) 0.4 MG SL tablet Place 1 tablet (0.4 mg total) under the tongue every 5 (five) minutes as needed for chest pain. (Patient not taking: Reported on 09/09/2022)   rivaroxaban (XARELTO) 20 MG TABS tablet Take 1 tablet (20 mg total) by mouth daily with supper.   sertraline (ZOLOFT) 100 MG tablet TAKE 1 & 1/2 (ONE & ONE-HALF) TABLETS BY MOUTH ONCE DAILY NEED  TO  SCHEDULE  AN  APPOINTMENT   No facility-administered medications prior to visit.    Review of Systems  {Labs  Heme  Chem  Endocrine  Serology  Results Review (optional):23779}   Objective    There were no  vitals taken for this visit. {Show previous vital signs (optional):23777}  Physical Exam  ***  No results found for any visits on 09/16/22.  Assessment & Plan     ***  No follow-ups on file.      {provider attestation***:1}   [name], MD  [facility] [phone] (phone) [phone] (fax)  [place]

## 2022-09-16 NOTE — Progress Notes (Signed)
Established patient visit  I,Tara Marquez,acting as a scribe for [name], MD.,have documented all relevant documentation on the behalf of [name], MD,as directed by  [name], MD while in the presence of [name], MD.   Patient: Tara Marquez   DOB: 02-04-1941   81 y.o. Female  MRN: [id number] Visit Date: 09/16/2022  Today's healthcare provider: [name], MD   Chief Complaint  Patient presents with   Follow-up   Hypertension   Subjective    HPI  She comes in today for follow-up. He is feeling pretty well but recently had a T of the abdomen because of  hematuria which had a questionable finding of distal thoracic aorta with possible aneurysmal  dilatation and atherosclerotic ulceration.  Dissection protocol CTA recommended.  Patient is completely asymptomatic for any chest pain or thoracic back pain. Hypertension, follow-up  BP Readings from Last 3 Encounters:  09/09/22 (!) 121/90  08/18/22 (!) 149/78  08/12/22 (!) 143/56   Wt Readings from Last 3 Encounters:  09/09/22 145 lb 12.8 oz (66.1 kg)  08/18/22 140 lb (63.5 kg)  07/28/22 149 lb 14.4 oz (68 kg)     She was last seen for hypertension 2 months ago.  Management since that visit includes; Follow home blood pressure readings..   Outside blood pressures are not checking.  Pertinent labs Lab Results  Component Value Date   CHOL 135 06/11/2021   HDL 50 06/11/2021   LDLCALC 67 06/11/2021   TRIG 93 06/11/2021   CHOLHDL 2.7 06/11/2021   Lab Results  Component Value Date   NA 142 04/26/2022   K 4.2 04/26/2022   CREATININE 1.20 (H) 08/27/2022   EGFR 55 (L) 04/26/2022   GLUCOSE 99 04/26/2022   TSH 2.090 04/26/2022     The ASCVD Risk score (Arnett DK, et al., 2019) failed to calculate for the following reasons:   The 2019 ASCVD risk score is only valid for ages 67 to  4  --------------------------------------------------------------------------------------------------- Depression, Follow-up  She  was last seen for this 2 months ago. Changes made at last visit include; Clinically stable on sertraline..     07/15/2022   11:46 AM 04/21/2022    1:04 PM 06/09/2021   11:31 AM  Depression screen PHQ 2/9  Decreased Interest 0 0 1  Down, Depressed, Hopeless 1 0 1  PHQ - 2 Score 1 0 2  Altered sleeping 1  0  Tired, decreased energy 2  2  Change in appetite 1  0  Feeling bad or failure about yourself  0  0  Trouble concentrating 0  0  Moving slowly or fidgety/restless 0  0  Suicidal thoughts 0  0  PHQ-9 Score 5  4  Difficult doing work/chores Not difficult at all  Not difficult at all    -----------------------------------------------------------------------------------------   Medications: Outpatient Medications Prior to Visit  Medication Sig   amiodarone (PACERONE) 200 MG tablet Take 1 tablet (200 mg total) by mouth 2 (two) times daily. (Patient taking differently: Take 100 mg by mouth daily.)   atorvastatin (LIPITOR) 40 MG tablet Take 1 tablet (40 mg total) by mouth daily at 6 PM.   doxycycline (VIBRA-TABS) 100 MG tablet Take 1 tablet (100 mg total) by mouth 2 (two) times daily for 10 days.   levothyroxine (EUTHYROX) 75 MCG tablet Take 1 tablet (75 mcg total) by mouth daily before breakfast. Please schedule office visit before any future refills  levothyroxine (SYNTHROID) 75 MCG tablet Take 1 tablet by mouth daily.   metoprolol tartrate (LOPRESSOR) 25 MG tablet Take 1 tablet by mouth daily.   nitroGLYCERIN (NITROSTAT) 0.4 MG SL tablet Place 1 tablet (0.4 mg total) under the tongue every 5 (five) minutes as needed for chest pain. (Patient not taking: Reported on 09/09/2022)   rivaroxaban (XARELTO) 20 MG TABS tablet Take 1 tablet (20 mg total) by mouth daily with supper.   sertraline (ZOLOFT) 100 MG tablet TAKE 1 & 1/2 (ONE & ONE-HALF) TABLETS BY  MOUTH ONCE DAILY NEED  TO  SCHEDULE  AN  APPOINTMENT   No facility-administered medications prior to visit.    Review of Systems  Constitutional:  Negative for appetite change, chills, fatigue and fever.  Respiratory:  Negative for chest tightness and shortness of breath.   Cardiovascular:  Negative for chest pain and palpitations.  Gastrointestinal:  Negative for abdominal pain, nausea and vomiting.  Neurological:  Negative for dizziness and weakness.    Last lipids Lab Results  Component Value Date   CHOL 135 06/11/2021   HDL 50 06/11/2021   LDLCALC 67 06/11/2021   TRIG 93 06/11/2021   CHOLHDL 2.7 06/11/2021       Objective    There were no vitals taken for this visit. BP Readings from Last 3 Encounters:  09/17/22 130/70  09/16/22 (!) 138/51  09/09/22 (!) 121/90   Wt Readings from Last 3 Encounters:  09/17/22 142 lb (64.4 kg)  09/16/22 145 lb (65.8 kg)  09/09/22 145 lb 12.8 oz (66.1 kg)      Physical Exam Vitals reviewed.  Constitutional:      General: She is not in acute distress.    Appearance: She is well-developed.  HENT:     Head: Normocephalic and atraumatic.     Right Ear: Hearing normal.     Left Ear: Hearing normal.     Nose: Nose normal.  Eyes:     General: Lids are normal. No scleral icterus.       Right eye: No discharge.        Left eye: No discharge.     Conjunctiva/sclera: Conjunctivae normal.  Cardiovascular:     Rate and Rhythm: Normal rate and regular rhythm.     Heart sounds: Normal heart sounds.  Pulmonary:     Effort: Pulmonary effort is normal. No respiratory distress.  Skin:    Findings: No lesion or rash.  Neurological:     General: No focal deficit present.     Mental Status: She is alert and oriented to person, place, and time.  Psychiatric:        Mood and Affect: Mood normal.        Speech: Speech normal.        Behavior: Behavior normal.        Thought Content: Thought content normal.        Judgment: Judgment normal.        No results found for any visits on 09/16/22.  Assessment & Plan     1. Benign essential HTN Good blood pressure control.  2. Weight loss Follow weight for now  3. Need for influenza vaccination  - Flu Vaccine QUAD High Dose(Fluad)  4. Aneurysm of descending thoracic aorta, unspecified whether ruptured (HCC) Obtain CTA dissection protocol - CTA CHEST DISSECTION W&/OR WO & GATING  5. Paroxysmal atrial fibrillation (HCC) Xarelto and amiodarone  6. Gastroesophageal reflux disease without esophagitis   7. Adult hypothyroidism TSH on  amiodarone  8. Reactive depression Continue sertraline indefinitely.   No follow-ups on file.      I, [name], MD, have reviewed all documentation for this visit. The documentation on 09/20/22 for the exam, diagnosis, procedures, and orders are all accurate and complete.    Tonji Elliff Cranford Mon, MD  [facility] [phone] (phone) [phone] (fax)  [place]

## 2022-09-17 ENCOUNTER — Ambulatory Visit: Payer: Medicare Other | Admitting: Urology

## 2022-09-17 ENCOUNTER — Encounter: Payer: Self-pay | Admitting: Urology

## 2022-09-17 VITALS — BP 130/70 | HR 72 | Ht 65.0 in | Wt 142.0 lb

## 2022-09-17 DIAGNOSIS — R310 Gross hematuria: Secondary | ICD-10-CM | POA: Diagnosis not present

## 2022-09-17 LAB — MICROSCOPIC EXAMINATION: RBC, Urine: >30 /hpf — AB (ref 0–2)

## 2022-09-17 LAB — URINALYSIS, COMPLETE

## 2022-09-17 NOTE — Progress Notes (Signed)
   09/17/22  CC:  Chief Complaint  Patient presents with   Cysto    HPI: Refer to my previous note 08/18/2022.  She still having intermittent gross hematuria.  CTU showed bilateral, nonobstructing renal calculi; the largest in the left lower pole measuring 8 mm.  Incidentally noted to have abnormal appearance of the distal descending thoracic aorta which was not adequately visualized on the abdominal scan.  A CTA chest has been ordered by Dr. Rosanna Randy.   Started on antibiotics last week for asymptomatic bacteriuria   Cystoscopy Procedure Note  Patient identification was confirmed, informed consent was obtained, and patient was prepped using Betadine solution.  Lidocaine jelly was administered per urethral meatus.    Procedure: - Flexible cystoscope introduced, without any difficulty.   - Thorough search of the bladder revealed:    normal urethral meatus    normal urothelium    no stones    no ulcers     no tumors    no urethral polyps    no trabeculation  - Ureteral orifices were normal in position and appearance.  Post-Procedure: - Patient tolerated the procedure well  Assessment/ Plan: Bilateral nephrolithiasis-recommend office visit KUB 6 months UA today with 11-30 WBC and >30 RBC.  Urine culture was repeated.  If urine culture negative she will bring in a urine for cytology   Abbie Sons, MD

## 2022-09-20 LAB — CULTURE, URINE COMPREHENSIVE

## 2022-09-23 DIAGNOSIS — I10 Essential (primary) hypertension: Secondary | ICD-10-CM | POA: Diagnosis not present

## 2022-09-23 DIAGNOSIS — E782 Mixed hyperlipidemia: Secondary | ICD-10-CM | POA: Diagnosis not present

## 2022-09-23 DIAGNOSIS — I480 Paroxysmal atrial fibrillation: Secondary | ICD-10-CM | POA: Diagnosis not present

## 2022-09-23 DIAGNOSIS — I493 Ventricular premature depolarization: Secondary | ICD-10-CM | POA: Diagnosis not present

## 2022-09-28 ENCOUNTER — Ambulatory Visit (INDEPENDENT_AMBULATORY_CARE_PROVIDER_SITE_OTHER): Payer: Medicare Other | Admitting: Gastroenterology

## 2022-09-28 ENCOUNTER — Encounter: Payer: Self-pay | Admitting: Gastroenterology

## 2022-09-28 DIAGNOSIS — D509 Iron deficiency anemia, unspecified: Secondary | ICD-10-CM

## 2022-09-28 NOTE — Progress Notes (Signed)
Primary Care Physician: Jerrol Banana., MD  Primary Gastroenterologist:  Dr. Lucilla Lame  Chief Complaint  Patient presents with   Iron Deficiency Anemia    HPI: Tara Marquez is a 81 y.o. female here for iron deficiency anemia.  The patient had seen me in the past and had a colonoscopy earlier this year without any cause for her anemia seen at that time.  A repeat colonoscopy was not recommended due to her age.  The procedure was being done for a positive Cologuard test.  The patient now has had anemia with improvement of her iron studies with her most recent lab work. The colonoscopy was this year. She has a kidney stone and blood in her urine.  Past Medical History:  Diagnosis Date   A-fib ([place])    Cancer ([place])    skin ca   Depression    mild major depression   GERD (gastroesophageal reflux disease)    Hyperlipidemia    Hypertension    Hypothyroidism     Current Outpatient Medications  Medication Sig Dispense Refill   amiodarone (PACERONE) 200 MG tablet Take 1 tablet (200 mg total) by mouth 2 (two) times daily. (Patient taking differently: Take 100 mg by mouth daily.) 50 tablet 0   atorvastatin (LIPITOR) 40 MG tablet Take 1 tablet (40 mg total) by mouth daily at 6 PM. 30 tablet 0   levothyroxine (SYNTHROID) 75 MCG tablet Take 1 tablet by mouth daily.     metoprolol tartrate (LOPRESSOR) 25 MG tablet Take 1 tablet by mouth daily.     nitroGLYCERIN (NITROSTAT) 0.4 MG SL tablet Place 1 tablet (0.4 mg total) under the tongue every 5 (five) minutes as needed for chest pain. 30 tablet 2   rivaroxaban (XARELTO) 20 MG TABS tablet Take 1 tablet (20 mg total) by mouth daily with supper. 30 tablet 0   sertraline (ZOLOFT) 100 MG tablet TAKE 1 & 1/2 (ONE & ONE-HALF) TABLETS BY MOUTH ONCE DAILY NEED  TO  SCHEDULE  AN  APPOINTMENT 135 tablet 3   No current facility-administered medications for this visit.    Allergies as of 09/28/2022 - Review Complete 09/28/2022  Allergen  Reaction Noted   Sulfa antibiotics Hives 08/27/2015   Amoxicillin Rash 08/27/2015   Penicillins Rash 08/27/2015    ROS:  General: Negative for anorexia, weight loss, fever, chills, fatigue, weakness. ENT: Negative for hoarseness, difficulty swallowing , nasal congestion. CV: Negative for chest pain, angina, palpitations, dyspnea on exertion, peripheral edema.  Respiratory: Negative for dyspnea at rest, dyspnea on exertion, cough, sputum, wheezing.  GI: See history of present illness. GU:  Negative for dysuria, hematuria, urinary incontinence, urinary frequency, nocturnal urination.  Endo: Negative for unusual weight change.    Physical Examination:   There were no vitals taken for this visit.  General: Well-nourished, well-developed in no acute distress.  Eyes: No icterus. Conjunctivae pink. Neuro: Alert and oriented x 3.  Grossly intact. Skin: Warm and dry, no jaundice.   Psych: Alert and cooperative, normal mood and affect.  Labs:    Imaging Studies: No results found.  Assessment and Plan:   Tara Marquez is a 81 y.o. y/o female who comes in today with iron deficiency anemia.  The patient does have hematuria but was sent to me today for the and extreme and to rule out a GI source.  The patient had a colonoscopy early this year.  The patient will be set up for a upper endoscopy  and if negative will need a capsule endoscopy.  The patient has been explained the plan and agrees with it.     Lucilla Lame, MD. [name]    Note: This dictation was prepared with Dragon dictation along with smaller phrase technology. Any transcriptional errors that result from this process are unintentional.

## 2022-09-29 ENCOUNTER — Ambulatory Visit (HOSPITAL_COMMUNITY)
Admission: RE | Admit: 2022-09-29 | Discharge: 2022-09-29 | Disposition: A | Discharge status: Home / Self Care | Payer: Medicare Other | Source: Ambulatory Visit | Attending: Family Medicine | Admitting: Family Medicine

## 2022-09-29 DIAGNOSIS — I712 Thoracic aortic aneurysm, without rupture, unspecified: Secondary | ICD-10-CM | POA: Diagnosis not present

## 2022-09-29 DIAGNOSIS — I7123 Aneurysm of the descending thoracic aorta, without rupture: Secondary | ICD-10-CM | POA: Insufficient documentation

## 2022-09-29 DIAGNOSIS — J9811 Atelectasis: Secondary | ICD-10-CM | POA: Diagnosis not present

## 2022-09-29 MED ORDER — IOHEXOL 350 MG/ML SOLN
75.0000 mL | Freq: Once | INTRAVENOUS | Status: AC | PRN
  Administered 2022-09-29: 75 mL via INTRAVENOUS

## 2022-10-04 ENCOUNTER — Telehealth: Payer: Self-pay

## 2022-10-04 DIAGNOSIS — I71012 Dissection of descending thoracic aorta: Secondary | ICD-10-CM

## 2022-10-04 NOTE — Telephone Encounter (Signed)
Tara Marquez with [facility]iology calling to give report from CTA Chest, advised would pass along to provider covering for Dr. Rosanna Randy pts  IMPRESSION: 1. Tandem short-segment dissections versus penetrating atheromatous ulcers in the descending thoracic aorta with associated 4.9 cm aneurysmal dilatation. Recommend semi-annual imaging followup by CTA or MRA and referral to cardiothoracic surgery if not already obtained. This recommendation follows 2010 ACCF/AHA/AATS/ACR/ASA/SCA/SCAI/SIR/STS/SVM Guidelines for the Diagnosis and Management of Patients With Thoracic Aortic Disease. Circulation. 2010; 121: e266-e36 2. Coronary calcifications 3. Nonobstructing left urolithiasis

## 2022-10-04 NOTE — Addendum Note (Signed)
Addended by: Adolph Pollack on: 10/04/2022 04:55 PM   Modules accepted: Orders

## 2022-10-04 NOTE — Telephone Encounter (Signed)
CT surg referral placed    Eulis Foster, MD  [facility]  [phone]

## 2022-10-05 ENCOUNTER — Other Ambulatory Visit: Payer: Self-pay | Admitting: Family Medicine

## 2022-10-05 DIAGNOSIS — I71012 Dissection of descending thoracic aorta: Secondary | ICD-10-CM

## 2022-10-25 ENCOUNTER — Other Ambulatory Visit: Payer: Self-pay | Admitting: Family Medicine

## 2022-10-25 NOTE — Telephone Encounter (Signed)
Medication Refill - Medication: levothyroxine (SYNTHROID) 75 MCG tablet   Has the patient contacted their pharmacy? Yes.   Pt told to contact provider  Preferred Pharmacy (with phone number or street name):  [id number] - , [place] - [address] Phone: [phone]  Fax: [phone]     Has the patient been seen for an appointment in the last year OR does the patient have an upcoming appointment? Yes.    Agent: Please be advised that RX refills may take up to 3 business days. We ask that you follow-up with your pharmacy.   

## 2022-10-26 NOTE — Telephone Encounter (Signed)
Called patient to review request for medication and last ordered by historical provider. No answer, LVMTCB

## 2022-10-26 NOTE — Telephone Encounter (Signed)
Requested medication (s) are due for refill today: historical medication  Requested medication (s) are on the active medication list: yes   Last refill:  01/26/21  Future visit scheduled: no   Notes to clinic:  historical medication. Expired medication. Called patient to review medication request. No answer, LVMTCB do you want to order Rx?      Requested Prescriptions  Pending Prescriptions Disp Refills   levothyroxine (SYNTHROID) 75 MCG tablet      Sig: Take 1 tablet (75 mcg total) by mouth daily.     Endocrinology:  Hypothyroid Agents Passed - 10/25/2022  3:28 PM      Passed - TSH in normal range and within 360 days    TSH  Date Value Ref Range Status  04/26/2022 2.090 0.450 - 4.500 uIU/mL Final         Passed - Valid encounter within last 12 months    Recent Outpatient Visits           1 month ago Benign essential HTN   [facility] Jerrol Banana., MD   3 months ago Benign essential HTN   [facility] Jerrol Banana., MD   6 months ago Weight loss   [facility] Jerrol Banana., MD   11 months ago Upper respiratory tract infection, unspecified type   [facility] Birdie Sons, MD   1 year ago Acute pain of both shoulders   [facility] Jerrol Banana., MD       Future Appointments             In 4 months Stoioff, Ronda Fairly, MD [place]

## 2022-10-27 MED ORDER — LEVOTHYROXINE SODIUM 75 MCG PO TABS: 75.0000 ug | ORAL_TABLET | Freq: Every day | ORAL | 1 refills | Status: AC

## 2022-11-01 ENCOUNTER — Telehealth: Payer: Self-pay

## 2022-11-01 NOTE — Telephone Encounter (Signed)
Left message on voicemail Msg in chart from cardiology saying it is okay for pt to hold... Pt has been off of blood thinner since 10/28/22

## 2022-11-01 NOTE — Telephone Encounter (Signed)
Per pt report, she stopped Xarelto 10/28/2022 on her own... Clearance not sent, will call Dr Alveria Apley office to try to obtain today  Clearance faxed, marked urgent

## 2022-11-01 NOTE — Telephone Encounter (Signed)
Telephone Encounter - Janice Coffin, [place] - 10/20/2022 9:47 AM EDT Formatting of this note might be different from the original. Yes, this should be fine Electronically signed by Janice Coffin, [place] at 10/20/2022 9:47 AM EDT   Telephone Encounter - Rimmer, Amy - 10/19/2022 4:48 PM EDT Formatting of this note might be different from the original. Please advise pt if it will ok to be off xarelto longer than the 5 day she was told for her endoscopy.  She is having dental procedure as well  Endoscopy is 11/02/22 and dental work is 11/08/22  CB# [phone] Electronically signed by Rimmer, Amy at 10/19/2022 4:50 PM EDT

## 2022-11-02 ENCOUNTER — Encounter: Payer: Self-pay | Admitting: Gastroenterology

## 2022-11-02 ENCOUNTER — Ambulatory Visit: Payer: Medicare Other | Admitting: Registered Nurse

## 2022-11-02 ENCOUNTER — Ambulatory Visit
Admission: RE | Admit: 2022-11-02 | Discharge: 2022-11-02 | Disposition: A | Discharge status: Home / Self Care | Payer: Medicare Other | Attending: Gastroenterology | Admitting: Gastroenterology

## 2022-11-02 ENCOUNTER — Encounter
Admission: RE | Disposition: A | Discharge status: Home / Self Care | Payer: Self-pay | Source: Home / Self Care | Attending: Gastroenterology

## 2022-11-02 DIAGNOSIS — D131 Benign neoplasm of stomach: Secondary | ICD-10-CM | POA: Diagnosis not present

## 2022-11-02 DIAGNOSIS — K2281 Esophageal polyp: Secondary | ICD-10-CM

## 2022-11-02 DIAGNOSIS — I4891 Unspecified atrial fibrillation: Secondary | ICD-10-CM | POA: Insufficient documentation

## 2022-11-02 DIAGNOSIS — E785 Hyperlipidemia, unspecified: Secondary | ICD-10-CM | POA: Insufficient documentation

## 2022-11-02 DIAGNOSIS — Z79899 Other long term (current) drug therapy: Secondary | ICD-10-CM | POA: Insufficient documentation

## 2022-11-02 DIAGNOSIS — G509 Disorder of trigeminal nerve, unspecified: Secondary | ICD-10-CM | POA: Diagnosis not present

## 2022-11-02 DIAGNOSIS — D509 Iron deficiency anemia, unspecified: Secondary | ICD-10-CM | POA: Insufficient documentation

## 2022-11-02 DIAGNOSIS — K2282 Esophagogastric junction polyp: Secondary | ICD-10-CM | POA: Diagnosis not present

## 2022-11-02 DIAGNOSIS — K219 Gastro-esophageal reflux disease without esophagitis: Secondary | ICD-10-CM | POA: Insufficient documentation

## 2022-11-02 DIAGNOSIS — I10 Essential (primary) hypertension: Secondary | ICD-10-CM | POA: Insufficient documentation

## 2022-11-02 DIAGNOSIS — K635 Polyp of colon: Secondary | ICD-10-CM | POA: Diagnosis not present

## 2022-11-02 DIAGNOSIS — K449 Diaphragmatic hernia without obstruction or gangrene: Secondary | ICD-10-CM | POA: Diagnosis not present

## 2022-11-02 DIAGNOSIS — Z85828 Personal history of other malignant neoplasm of skin: Secondary | ICD-10-CM | POA: Insufficient documentation

## 2022-11-02 DIAGNOSIS — E039 Hypothyroidism, unspecified: Secondary | ICD-10-CM | POA: Diagnosis not present

## 2022-11-02 HISTORY — PX: ESOPHAGOGASTRODUODENOSCOPY: SHX5428

## 2022-11-02 SURGERY — EGD (ESOPHAGOGASTRODUODENOSCOPY)
Anesthesia: General

## 2022-11-02 MED ORDER — PROPOFOL 10 MG/ML IV BOLUS
INTRAVENOUS | Status: DC | PRN
  Administered 2022-11-02: 70 mg via INTRAVENOUS

## 2022-11-02 MED ORDER — PROPOFOL 500 MG/50ML IV EMUL
INTRAVENOUS | Status: DC | PRN
  Administered 2022-11-02: 150 ug/kg/min via INTRAVENOUS

## 2022-11-02 MED ORDER — LIDOCAINE HCL (CARDIAC) PF 100 MG/5ML IV SOSY
PREFILLED_SYRINGE | INTRAVENOUS | Status: DC | PRN
  Administered 2022-11-02: 40 mg via INTRAVENOUS

## 2022-11-02 MED ORDER — SODIUM CHLORIDE 0.9 % IV SOLN: INTRAVENOUS | Status: DC

## 2022-11-02 MED ORDER — ONDANSETRON HCL 4 MG/2ML IJ SOLN: INTRAMUSCULAR | Status: DC | PRN

## 2022-11-02 NOTE — Op Note (Signed)
[facility] Gastroenterology Patient Name: Tara Marquez Procedure Date: 11/02/2022 9:04 AM MRN: [id number] Account #: [id number] Date of Birth: 03-22-1941 Admit Type: Outpatient Age: 81 Room: [facility] ENDO ROOM 4 Gender: Female Note Status: Finalized Instrument Name: Altamese Cabal Endoscope [id number] Procedure:             Upper GI endoscopy Indications:           Iron deficiency anemia Providers:             Lucilla Lame MD, MD Medicines:             Propofol per Anesthesia Complications:         No immediate complications. Procedure:             Pre-Anesthesia Assessment:                        - Prior to the procedure, a History and Physical was                         performed, and patient medications and allergies were                         reviewed. The patient's tolerance of previous                         anesthesia was also reviewed. The risks and benefits                         of the procedure and the sedation options and risks                         were discussed with the patient. All questions were                         answered, and informed consent was obtained. Prior                         Anticoagulants: The patient has taken no anticoagulant                         or antiplatelet agents. ASA Grade Assessment: II - A                         patient with mild systemic disease. After reviewing                         the risks and benefits, the patient was deemed in                         satisfactory condition to undergo the procedure.                        After obtaining informed consent, the endoscope was                         passed under direct vision. Throughout the procedure,                         the patient's  blood pressure, pulse, and oxygen                         saturations were monitored continuously. The Endoscope                         was introduced through the mouth, and advanced to the                         second part of  duodenum. The upper GI endoscopy was                         accomplished without difficulty. The patient tolerated                         the procedure well. Findings:      A small hiatal hernia was present.      A single polyp with no bleeding was found at the gastroesophageal       junction. Biopsies were taken with a cold forceps for histology.      The stomach was normal.      The examined duodenum was normal. Impression:            - Small hiatal hernia.                        - Gastroesophageal junction polyp(s) were found.                         Biopsied.                        - Normal stomach.                        - Normal examined duodenum. Recommendation:        - Discharge patient to home.                        - Resume previous diet.                        - Continue present medications.                        - Await pathology results.                        - To visualize the small bowel, perform video capsule                         endoscopy at appointment to be scheduled. Procedure Code(s):     --- Professional ---                        [phone], Esophagogastroduodenoscopy, flexible,                         transoral; with biopsy, single or multiple Diagnosis Code(s):     --- Professional ---                        D50.9, Iron  deficiency anemia, unspecified                        K22.82, Esophagogastric junction polyp CPT copyright 2022 American Medical Association. All rights reserved. The codes documented in this report are preliminary and upon coder review may  be revised to meet current compliance requirements. Lucilla Lame MD, MD 11/02/2022 9:40:46 AM This report has been signed electronically. Number of Addenda: 0 Note Initiated On: 11/02/2022 9:04 AM Estimated Blood Loss:  Estimated blood loss: none.      [facility]

## 2022-11-02 NOTE — Anesthesia Procedure Notes (Signed)
Date/Time: 11/02/2022 9:37 AM  Performed by: Doreen Salvage, CRNAPre-anesthesia Checklist: Patient identified, Emergency Drugs available, Suction available and Patient being monitored Patient Re-evaluated:Patient Re-evaluated prior to induction Oxygen Delivery Method: Nasal cannula Induction Type: IV induction Dental Injury: Teeth and Oropharynx as per pre-operative assessment  Comments: Nasal cannula with etCO2 monitoring

## 2022-11-02 NOTE — Anesthesia Preprocedure Evaluation (Signed)
Anesthesia Evaluation  Patient identified by MRN, date of birth, ID band Patient awake    Reviewed: Allergy & Precautions, H&P , NPO status , Patient's Chart, lab work & pertinent test results, reviewed documented beta blocker date and time   Airway Mallampati: II   Neck ROM: full    Dental  (+) Poor Dentition   Pulmonary neg pulmonary ROS   Pulmonary exam normal        Cardiovascular Exercise Tolerance: Good hypertension, On Medications + dysrhythmias Atrial Fibrillation  Rhythm:regular Rate:Normal     Neuro/Psych negative neurological ROS  negative psych ROS   GI/Hepatic Neg liver ROS,GERD  Medicated,,  Endo/Other  Hypothyroidism    Renal/GU negative Renal ROS  negative genitourinary   Musculoskeletal   Abdominal   Peds  Hematology  (+) Blood dyscrasia, anemia   Anesthesia Other Findings Past Medical History: No date: A-fib (HCC) No date: Cancer ([place])     Comment:  skin ca No date: Depression     Comment:  mild major depression No date: GERD (gastroesophageal reflux disease) No date: Hyperlipidemia No date: Hypertension No date: Hypothyroidism Past Surgical History: 03/1971: ABDOMINAL HYSTERECTOMY     Comment:  Status Post Hysterectomy No date: CATARACT EXTRACTION; Left No date: COLONOSCOPY     Comment:  2010 01/12/2022: COLONOSCOPY WITH PROPOFOL; N/A     Comment:  Procedure: COLONOSCOPY WITH PROPOFOL;  Surgeon: Lucilla Lame, MD;  Location: [facility] ENDOSCOPY;  Service:               Endoscopy;  Laterality: N/A; No date: EYE SURGERY No date: TONSILLECTOMY 1951: TONSILLECTOMY AND ADENOIDECTOMY 1970: WISDOM TOOTH EXTRACTION BMI    Body Mass Index: 23.96 kg/m     Reproductive/Obstetrics negative OB ROS                             Anesthesia Physical Anesthesia Plan  ASA: 3  Anesthesia Plan: General   Post-op Pain Management:    Induction:   PONV Risk  Score and Plan:   Airway Management Planned:   Additional Equipment:   Intra-op Plan:   Post-operative Plan:   Informed Consent: I have reviewed the patients History and Physical, chart, labs and discussed the procedure including the risks, benefits and alternatives for the proposed anesthesia with the patient or authorized representative who has indicated his/her understanding and acceptance.     Dental Advisory Given  Plan Discussed with: CRNA  Anesthesia Plan Comments:        Anesthesia Quick Evaluation

## 2022-11-02 NOTE — Anesthesia Postprocedure Evaluation (Signed)
Anesthesia Post Note  Patient: Tara Marquez  Procedure(s) Performed: ESOPHAGOGASTRODUODENOSCOPY (EGD)  Patient location during evaluation: PACU Anesthesia Type: General Level of consciousness: awake and alert Pain management: pain level controlled Vital Signs Assessment: post-procedure vital signs reviewed and stable Respiratory status: spontaneous breathing, nonlabored ventilation, respiratory function stable and patient connected to nasal cannula oxygen Cardiovascular status: blood pressure returned to baseline and stable Postop Assessment: no apparent nausea or vomiting Anesthetic complications: no   No notable events documented.   Last Vitals:  Vitals:   11/02/22 0956 11/02/22 1006  BP: (!) 134/56 (!) 148/61  Pulse: (!) 53 (!) 53  Resp: 17 17  Temp:    SpO2: 97% 100%    Last Pain:  Vitals:   11/02/22 1006  TempSrc:   PainSc: 0-No pain                 Molli Barrows

## 2022-11-02 NOTE — H&P (Signed)
Lucilla Lame, MD [address] [place], [place] [address] Phone:[phone] Fax : [phone]  Primary Care Physician:  Jerrol Banana., MD Primary Gastroenterologist:  Dr. Allen Norris  Pre-Procedure History & Physical: HPI:  Tara Marquez is a 81 y.o. female is here for an endoscopy.   Past Medical History:  Diagnosis Date   A-fib ([place])    Cancer ([place])    skin ca   Depression    mild major depression   GERD (gastroesophageal reflux disease)    Hyperlipidemia    Hypertension    Hypothyroidism     Past Surgical History:  Procedure Laterality Date   ABDOMINAL HYSTERECTOMY  03/1971   Status Post Hysterectomy   CATARACT EXTRACTION Left    COLONOSCOPY     2010   COLONOSCOPY WITH PROPOFOL N/A 01/12/2022   Procedure: COLONOSCOPY WITH PROPOFOL;  Surgeon: Lucilla Lame, MD;  Location: [facility] ENDOSCOPY;  Service: Endoscopy;  Laterality: N/A;   EYE SURGERY     TONSILLECTOMY     TONSILLECTOMY AND ADENOIDECTOMY  1951   WISDOM TOOTH EXTRACTION  1970    Prior to Admission medications   Medication Sig Start Date End Date Taking? Authorizing Provider  amiodarone (PACERONE) 200 MG tablet Take 1 tablet (200 mg total) by mouth 2 (two) times daily. Patient taking differently: Take 100 mg by mouth daily. 10/26/18  Yes Vaughan Basta, MD  atorvastatin (LIPITOR) 40 MG tablet Take 1 tablet (40 mg total) by mouth daily at 6 PM. 10/22/18  Yes Vaughan Basta, MD  levothyroxine (SYNTHROID) 75 MCG tablet Take 1 tablet (75 mcg total) by mouth daily. 10/27/22  Yes Simmons-Robinson, Makiera, MD  metoprolol tartrate (LOPRESSOR) 25 MG tablet Take 1 tablet by mouth daily.   Yes [provider]  nitroGLYCERIN (NITROSTAT) 0.4 MG SL tablet Place 1 tablet (0.4 mg total) under the tongue every 5 (five) minutes as needed for chest pain. 10/20/18   Demetrios Loll, MD  rivaroxaban (XARELTO) 20 MG TABS tablet Take 1 tablet (20 mg total) by mouth daily with supper. 10/22/18    Vaughan Basta, MD  sertraline (ZOLOFT) 100 MG tablet TAKE 1 & 1/2 (ONE & ONE-HALF) TABLETS BY MOUTH ONCE DAILY NEED  TO  SCHEDULE  AN  APPOINTMENT 11/10/21   Jerrol Banana., MD    Allergies as of 09/29/2022 - Review Complete 09/28/2022  Allergen Reaction Noted   Sulfa antibiotics Hives 08/27/2015   Amoxicillin Rash 08/27/2015   Penicillins Rash 08/27/2015    Family History  Problem Relation Age of Onset   Cancer Sister        Has had Breast Cancer and is in remission   Breast cancer Sister 76   Congenital heart disease Son     Social History   Socioeconomic History   Marital status: Married    Spouse name: Not on file   Number of children: 2   Years of education: Not on file   Highest education level: Some college, no degree  Occupational History   Occupation: retired  Tobacco Use   Smoking status: Never   Smokeless tobacco: Never  Vaping Use   Vaping Use: Never used  Substance and Sexual Activity   Alcohol use: No   Drug use: No   Sexual activity: Yes  Other Topics Concern   Not on file  Social History Narrative   Not on file   Social Determinants of Health   Financial Resource Strain: Low Risk  (04/21/2022)   Overall  Financial Resource Strain (CARDIA)    Difficulty of Paying Living Expenses: Not hard at all  Food Insecurity: No Food Insecurity (04/21/2022)   Hunger Vital Sign    Worried About Running Out of Food in the Last Year: Never true    Ran Out of Food in the Last Year: Never true  Transportation Needs: No Transportation Needs (04/21/2022)   PRAPARE - Hydrologist (Medical): No    Lack of Transportation (Non-Medical): No  Physical Activity: Unknown (04/21/2022)   Exercise Vital Sign    Days of Exercise per Week: Not on file    Minutes of Exercise per Session: 20 min  Stress: No Stress Concern Present (04/21/2022)   [place]    Feeling of  Stress : Only a little  Social Connections: Socially Integrated (04/21/2022)   Social Connection and Isolation Panel [NHANES]    Frequency of Communication with Friends and Family: More than three times a week    Frequency of Social Gatherings with Friends and Family: More than three times a week    Attends Religious Services: More than 4 times per year    Active Member of [employer] or Organizations: Yes    Attends Music therapist: More than 4 times per year    Marital Status: Married  Human resources officer Violence: Not At Risk (04/21/2022)   Humiliation, Afraid, Rape, and Kick questionnaire    Fear of Current or Ex-Partner: No    Emotionally Abused: No    Physically Abused: No    Sexually Abused: No    Review of Systems: See HPI, otherwise negative ROS  Physical Exam: There were no vitals taken for this visit. General:   Alert,  pleasant and cooperative in NAD Head:  Normocephalic and atraumatic. Neck:  Supple; no masses or thyromegaly. Lungs:  Clear throughout to auscultation.    Heart:  Regular rate and rhythm. Abdomen:  Soft, nontender and nondistended. Normal bowel sounds, without guarding, and without rebound.   Neurologic:  Alert and  oriented x4;  grossly normal neurologically.  Impression/Plan: Tara Marquez is here for an endoscopy to be performed for IDA  Risks, benefits, limitations, and alternatives regarding  endoscopy have been reviewed with the patient.  Questions have been answered.  All parties agreeable.   Lucilla Lame, MD  11/02/2022, 9:06 AM

## 2022-11-02 NOTE — Transfer of Care (Signed)
Immediate Anesthesia Transfer of Care Note  Patient: Tara Marquez  Procedure(s) Performed: ESOPHAGOGASTRODUODENOSCOPY (EGD)  Patient Location: PACU and Endoscopy Unit  Anesthesia Type:General  Level of Consciousness: drowsy  Airway & Oxygen Therapy: Patient Spontanous Breathing  Post-op Assessment: Report given to RN and Post -op Vital signs reviewed and stable  Post vital signs: Reviewed and stable  Last Vitals:  Vitals Value Taken Time  BP    Temp    Pulse 54 11/02/22 0946  Resp 29 11/02/22 0946  SpO2 95 % 11/02/22 0946    Last Pain:  Vitals:   11/02/22 0903  TempSrc: Temporal         Complications: No notable events documented.

## 2022-11-03 ENCOUNTER — Encounter: Payer: Self-pay | Admitting: Gastroenterology

## 2022-11-03 LAB — SURGICAL PATHOLOGY

## 2022-11-04 ENCOUNTER — Inpatient Hospital Stay: Payer: Medicare Other | Attending: Internal Medicine

## 2022-11-04 DIAGNOSIS — Z79899 Other long term (current) drug therapy: Secondary | ICD-10-CM | POA: Diagnosis not present

## 2022-11-04 DIAGNOSIS — D500 Iron deficiency anemia secondary to blood loss (chronic): Secondary | ICD-10-CM

## 2022-11-04 DIAGNOSIS — I10 Essential (primary) hypertension: Secondary | ICD-10-CM | POA: Diagnosis not present

## 2022-11-04 DIAGNOSIS — Z7901 Long term (current) use of anticoagulants: Secondary | ICD-10-CM | POA: Insufficient documentation

## 2022-11-04 DIAGNOSIS — E785 Hyperlipidemia, unspecified: Secondary | ICD-10-CM | POA: Insufficient documentation

## 2022-11-04 DIAGNOSIS — E039 Hypothyroidism, unspecified: Secondary | ICD-10-CM | POA: Insufficient documentation

## 2022-11-04 DIAGNOSIS — D509 Iron deficiency anemia, unspecified: Secondary | ICD-10-CM | POA: Diagnosis not present

## 2022-11-04 DIAGNOSIS — I4891 Unspecified atrial fibrillation: Secondary | ICD-10-CM | POA: Diagnosis not present

## 2022-11-04 LAB — CBC WITH DIFFERENTIAL/PLATELET
Abs Immature Granulocytes: 0.02 10*3/uL (ref 0.00–0.07)
Basophils Absolute: 0 10*3/uL (ref 0.0–0.1)
Basophils Relative: 1 %
Eosinophils Absolute: 0.1 10*3/uL (ref 0.0–0.5)
Eosinophils Relative: 1 %
HCT: 32.5 % — ABNORMAL LOW (ref 36.0–46.0)
Hemoglobin: 10.2 g/dL — ABNORMAL LOW (ref 12.0–15.0)
Immature Granulocytes: 0 %
Lymphocytes Relative: 21 %
Lymphs Abs: 1.2 10*3/uL (ref 0.7–4.0)
MCH: 26.8 pg (ref 26.0–34.0)
MCHC: 31.4 g/dL (ref 30.0–36.0)
MCV: 85.3 fL (ref 80.0–100.0)
Monocytes Absolute: 0.4 10*3/uL (ref 0.1–1.0)
Monocytes Relative: 7 %
Neutro Abs: 3.9 10*3/uL (ref 1.7–7.7)
Neutrophils Relative %: 70 %
Platelets: 138 10*3/uL — ABNORMAL LOW (ref 150–400)
RBC: 3.81 MIL/uL — ABNORMAL LOW (ref 3.87–5.11)
RDW: 15.8 % — ABNORMAL HIGH (ref 11.5–15.5)
WBC: 5.6 10*3/uL (ref 4.0–10.5)
nRBC: 0 % (ref 0.0–0.2)

## 2022-11-04 LAB — FERRITIN: Ferritin: 17 ng/mL (ref 11–307)

## 2022-11-04 LAB — IRON AND TIBC
Iron: 26 ug/dL — ABNORMAL LOW (ref 28–170)
Saturation Ratios: 6 % — ABNORMAL LOW (ref 10.4–31.8)
TIBC: 461 ug/dL — ABNORMAL HIGH (ref 250–450)
UIBC: 435 ug/dL

## 2022-11-05 ENCOUNTER — Encounter: Payer: Self-pay | Admitting: Internal Medicine

## 2022-11-05 ENCOUNTER — Inpatient Hospital Stay: Payer: Medicare Other

## 2022-11-05 ENCOUNTER — Inpatient Hospital Stay (HOSPITAL_BASED_OUTPATIENT_CLINIC_OR_DEPARTMENT_OTHER): Payer: Medicare Other | Admitting: Internal Medicine

## 2022-11-05 ENCOUNTER — Ambulatory Visit: Payer: Medicare Other

## 2022-11-05 ENCOUNTER — Ambulatory Visit: Payer: Medicare Other | Admitting: Internal Medicine

## 2022-11-05 VITALS — BP 143/66 | HR 60 | Temp 98.7°F | Resp 20 | Wt 148.2 lb

## 2022-11-05 VITALS — BP 141/45 | HR 58

## 2022-11-05 DIAGNOSIS — I4891 Unspecified atrial fibrillation: Secondary | ICD-10-CM | POA: Diagnosis not present

## 2022-11-05 DIAGNOSIS — R319 Hematuria, unspecified: Secondary | ICD-10-CM

## 2022-11-05 DIAGNOSIS — D509 Iron deficiency anemia, unspecified: Secondary | ICD-10-CM

## 2022-11-05 DIAGNOSIS — E785 Hyperlipidemia, unspecified: Secondary | ICD-10-CM | POA: Diagnosis not present

## 2022-11-05 DIAGNOSIS — R195 Other fecal abnormalities: Secondary | ICD-10-CM

## 2022-11-05 DIAGNOSIS — I10 Essential (primary) hypertension: Secondary | ICD-10-CM | POA: Diagnosis not present

## 2022-11-05 DIAGNOSIS — Z7901 Long term (current) use of anticoagulants: Secondary | ICD-10-CM | POA: Diagnosis not present

## 2022-11-05 DIAGNOSIS — D500 Iron deficiency anemia secondary to blood loss (chronic): Secondary | ICD-10-CM

## 2022-11-05 DIAGNOSIS — E039 Hypothyroidism, unspecified: Secondary | ICD-10-CM | POA: Diagnosis not present

## 2022-11-05 MED ORDER — SODIUM CHLORIDE 0.9 % IV SOLN
510.0000 mg | Freq: Once | INTRAVENOUS | Status: AC
  Administered 2022-11-05: 510 mg via INTRAVENOUS
  Filled 2022-11-05: qty 17

## 2022-11-05 MED ORDER — SODIUM CHLORIDE 0.9 % IV SOLN
Freq: Once | INTRAVENOUS | Status: AC
  Filled 2022-11-05: qty 250

## 2022-11-05 NOTE — Progress Notes (Signed)
Patient states she has had blood in her urine the last two months.

## 2022-11-05 NOTE — Progress Notes (Signed)
[facility] Initial Visit:  Patient Care Team: Jerrol Banana., MD as PCP - General (Family Medicine) Birder Robson, MD as Referring Physician (Ophthalmology) Corey Skains, MD as Consulting Physician (Cardiology) Oneta Rack, MD as Consulting Physician (Dermatology)  CHIEF COMPLAINTS/PURPOSE OF CONSULTATION:  Oncology History   No history exists.    HISTORY OF PRESENTING ILLNESS: Tara Marquez 81 y.o. female is here because of iron deficiency anemia.   She has positive cologaurd test in Oct 2022. Colonoscopy done in Jan 2023 showed non bleeding internal hemorrhoids only. CBC showed worsening anemia with recent Hb 9.5. MCV 76. WBC and platelets normal. Iron panel from July 2023 showed ferritin 24, saturation 7%.  She received IV Feraheme x2 doses completed on 08/12/2022.  She was also found to have microscopic hematuria.  Follows with Dr. Bernardo Heater.  Cystoscopy was unremarkable. CT abdomen with and without contrast showed bilateral nephrolithiasis with largest kidney stone in the inferior pole measuring 8 mm and 2 punctate stones identified within the upper and lower pole of right kidney.  She had endoscopy with Dr. Allen Norris on 11/02/2022 which showed small hiatal hernia and benign polyp in stomach with no evidence of bleeding.  Repeat iron panel from 11/03/2022 showed significant drop and iron levels with ferritin of 17 and saturation 6%.   INTERVAL HISTORY-  Patient seen today for iron deficiency anemia from chronic blood loss. She continues to have intermittent dark-colored urine.  Work-up so far with urology has been unremarkable.  Denies any bleeding in stools.  Her energy level is good.   Review of Systems  Constitutional:  Negative for appetite change, diaphoresis and fatigue.  Respiratory:  Negative for cough and shortness of breath.   Cardiovascular:  Negative for chest pain and leg swelling.  Gastrointestinal:  Negative for abdominal pain.   Neurological:  Negative for dizziness.   Positive ROS as above Rest 10 points ROS negative  MEDICAL HISTORY: Past Medical History:  Diagnosis Date   A-fib ([place])    Cancer ([place])    skin ca   Depression    mild major depression   GERD (gastroesophageal reflux disease)    Hyperlipidemia    Hypertension    Hypothyroidism     SURGICAL HISTORY: Past Surgical History:  Procedure Laterality Date   ABDOMINAL HYSTERECTOMY  03/1971   Status Post Hysterectomy   CATARACT EXTRACTION Left    COLONOSCOPY     2010   COLONOSCOPY WITH PROPOFOL N/A 01/12/2022   Procedure: COLONOSCOPY WITH PROPOFOL;  Surgeon: Lucilla Lame, MD;  Location: [facility] ENDOSCOPY;  Service: Endoscopy;  Laterality: N/A;   ESOPHAGOGASTRODUODENOSCOPY N/A 11/02/2022   Procedure: ESOPHAGOGASTRODUODENOSCOPY (EGD);  Surgeon: Lucilla Lame, MD;  Location: [facility] ENDOSCOPY;  Service: Endoscopy;  Laterality: N/A;   EYE SURGERY     TONSILLECTOMY     TONSILLECTOMY AND ADENOIDECTOMY  1951   WISDOM TOOTH EXTRACTION  1970    SOCIAL HISTORY: Social History   Socioeconomic History   Marital status: Married    Spouse name: Not on file   Number of children: 2   Years of education: Not on file   Highest education level: Some college, no degree  Occupational History   Occupation: retired  Tobacco Use   Smoking status: Never   Smokeless tobacco: Never  Vaping Use   Vaping Use: Never used  Substance and Sexual Activity   Alcohol use: No   Drug use: No   Sexual activity: Yes  Other Topics Concern  Not on file  Social History Narrative   Not on file   Social Determinants of Health   Financial Resource Strain: Low Risk  (04/21/2022)   Overall Financial Resource Strain (CARDIA)    Difficulty of Paying Living Expenses: Not hard at all  Food Insecurity: No Food Insecurity (04/21/2022)   Hunger Vital Sign    Worried About Running Out of Food in the Last Year: Never true    Ran Out of Food in the Last Year: Never true   Transportation Needs: No Transportation Needs (04/21/2022)   PRAPARE - Hydrologist (Medical): No    Lack of Transportation (Non-Medical): No  Physical Activity: Unknown (04/21/2022)   Exercise Vital Sign    Days of Exercise per Week: Not on file    Minutes of Exercise per Session: 20 min  Stress: No Stress Concern Present (04/21/2022)   [place]    Feeling of Stress : Only a little  Social Connections: Socially Integrated (04/21/2022)   Social Connection and Isolation Panel [NHANES]    Frequency of Communication with Friends and Family: More than three times a week    Frequency of Social Gatherings with Friends and Family: More than three times a week    Attends Religious Services: More than 4 times per year    Active Member of [employer] or Organizations: Yes    Attends Music therapist: More than 4 times per year    Marital Status: Married  Human resources officer Violence: Not At Risk (04/21/2022)   Humiliation, Afraid, Rape, and Kick questionnaire    Fear of Current or Ex-Partner: No    Emotionally Abused: No    Physically Abused: No    Sexually Abused: No    FAMILY HISTORY Family History  Problem Relation Age of Onset   Cancer Sister        Has had Breast Cancer and is in remission   Breast cancer Sister 94   Congenital heart disease Son     ALLERGIES:  is allergic to sulfa antibiotics, amoxicillin, and penicillins.  MEDICATIONS:  Current Outpatient Medications  Medication Sig Dispense Refill   amiodarone (PACERONE) 200 MG tablet Take 1 tablet (200 mg total) by mouth 2 (two) times daily. (Patient taking differently: Take 100 mg by mouth daily.) 50 tablet 0   atorvastatin (LIPITOR) 40 MG tablet Take 1 tablet (40 mg total) by mouth daily at 6 PM. 30 tablet 0   levothyroxine (SYNTHROID) 75 MCG tablet Take 1 tablet (75 mcg total) by mouth daily. 90 tablet 1   metoprolol tartrate  (LOPRESSOR) 25 MG tablet Take 1 tablet by mouth daily.     nitroGLYCERIN (NITROSTAT) 0.4 MG SL tablet Place 1 tablet (0.4 mg total) under the tongue every 5 (five) minutes as needed for chest pain. 30 tablet 2   rivaroxaban (XARELTO) 20 MG TABS tablet Take 1 tablet (20 mg total) by mouth daily with supper. 30 tablet 0   sertraline (ZOLOFT) 100 MG tablet TAKE 1 & 1/2 (ONE & ONE-HALF) TABLETS BY MOUTH ONCE DAILY NEED  TO  SCHEDULE  AN  APPOINTMENT 135 tablet 3   No current facility-administered medications for this visit.    PHYSICAL EXAMINATION:  ECOG PERFORMANCE STATUS: 2 - Symptomatic, <50% confined to bed   Vitals:   11/05/22 1325  BP: (!) 143/66  Pulse: 60  Resp: 20  Temp: 98.7 F (37.1 C)  SpO2: 99%  Filed Weights   11/05/22 1325  Weight: 148 lb 3.2 oz (67.2 kg)     Physical Exam Constitutional:      Appearance: Normal appearance.  HENT:     Head: Normocephalic and atraumatic.  Cardiovascular:     Rate and Rhythm: Normal rate.     Heart sounds: No murmur heard. Pulmonary:     Effort: Pulmonary effort is normal.     Breath sounds: Normal breath sounds.  Abdominal:     Palpations: Abdomen is soft.  Musculoskeletal:     Right lower leg: No edema.     Left lower leg: No edema.  Skin:    General: Skin is warm.  Neurological:     General: No focal deficit present.     Mental Status: She is oriented to person, place, and time.     LABORATORY DATA: I have personally reviewed the data as listed:  Appointment on 11/04/2022  Component Date Value Ref Range Status   Iron 11/04/2022 26 (L)  28 - 170 ug/dL Final   TIBC 11/04/2022 461 (H)  250 - 450 ug/dL Final   Saturation Ratios 11/04/2022 6 (L)  10.4 - 31.8 % Final   UIBC 11/04/2022 435  ug/dL Final   Performed at [facility], [address] [place], [place] [address]   Ferritin 11/04/2022 17  11 - 307 ng/mL Final   Performed at [facility], [address] [place], [place] [address]    WBC 11/04/2022 5.6  4.0 - 10.5 K/uL Final   RBC 11/04/2022 3.81 (L)  3.87 - 5.11 MIL/uL Final   Hemoglobin 11/04/2022 10.2 (L)  12.0 - 15.0 g/dL Final   HCT 11/04/2022 32.5 (L)  36.0 - 46.0 % Final   MCV 11/04/2022 85.3  80.0 - 100.0 fL Final   MCH 11/04/2022 26.8  26.0 - 34.0 pg Final   MCHC 11/04/2022 31.4  30.0 - 36.0 g/dL Final   RDW 11/04/2022 15.8 (H)  11.5 - 15.5 % Final   Platelets 11/04/2022 138 (L)  150 - 400 K/uL Final   nRBC 11/04/2022 0.0  0.0 - 0.2 % Final   Neutrophils Relative % 11/04/2022 70  % Final   Neutro Abs 11/04/2022 3.9  1.7 - 7.7 K/uL Final   Lymphocytes Relative 11/04/2022 21  % Final   Lymphs Abs 11/04/2022 1.2  0.7 - 4.0 K/uL Final   Monocytes Relative 11/04/2022 7  % Final   Monocytes Absolute 11/04/2022 0.4  0.1 - 1.0 K/uL Final   Eosinophils Relative 11/04/2022 1  % Final   Eosinophils Absolute 11/04/2022 0.1  0.0 - 0.5 K/uL Final   Basophils Relative 11/04/2022 1  % Final   Basophils Absolute 11/04/2022 0.0  0.0 - 0.1 K/uL Final   Immature Granulocytes 11/04/2022 0  % Final   Abs Immature Granulocytes 11/04/2022 0.02  0.00 - 0.07 K/uL Final   Performed at [facility], [address], [place], [place] [address]  Admission on 11/02/2022, Discharged on 11/02/2022  Component Date Value Ref Range Status   SURGICAL PATHOLOGY 11/02/2022    Final-Edited                   Value:SURGICAL PATHOLOGY CASE: [id number] PATIENT: Tara Marquez Surgical Pathology Report     Specimen Submitted: A. GEJ polyp; cbx  Clinical History: Iron deficiency anemia D50.9.  GEJ polyp    DIAGNOSIS: A. GASTROESOPHAGEAL JUNCTION POLYP; COLD BIOPSY: - FRAGMENTS OF BENIGN HYPERPLASTIC POLYP. - NEGATIVE FOR H.  PYLORI, INTESTINAL METAPLASIA, DYSPLASIA, AND MALIGNANCY.  GROSS DESCRIPTION: A. Labeled: GEJ polyp cbx Received: Formalin Collection time: 9:36 AM on 11/02/2022 Placed into formalin time: 9:36 AM on 11/02/2022 Tissue fragment(s): 1 Size: 0.8 x 0.3 x 0.2  cm Description: Tan elongated soft tissue fragment Entirely submitted in 1 cassette.  RB 11/02/2022  Final Diagnosis performed by Allena Napoleon, MD.   Electronically signed 11/03/2022 8:56:52AM The electronic signature indicates that the named Attending Pathologist has evaluated the specimen Technical component performed at [facility], [address] [place], [place] [address] Lab: [phone] Dir: Rush Farmer, MD, MMM  Professional component performed at [facility], [facility], [place], [place],  [address] Lab: [phone] Dir: Kathi Simpers, MD     ASSESSMENT/PLAN Tara Marquez is a 81 yo F with pmh of Afib, hypothyroidism HTN and HLD who was referred to Hematology for management of iron deficiency anemia.   #Iron deficiency anemia -Worse again.  Likely from chronic blood loss. -Status post 2 Feraheme infusions in August 2023 with lab work showing good response.  -Today hemoglobin down to 10.2.  Repeat iron panel from 11/03/2022 showed significant drop and iron levels with ferritin of 17 and saturation 6%.  We will do IV Feraheme today.  Scheduled for second dose next week.  -Colonoscopy done in Jan 2023 showed non bleeding internal hemorrhoids only.  She was also found to have microscopic hematuria.  Follows with Dr. Bernardo Heater.  Cystoscopy was unremarkable. CT abdomen with and without contrast showed bilateral nephrolithiasis with largest kidney stone in the inferior pole measuring 8 mm and 2 punctate stones identified within the upper and lower pole of right kidney.  She had endoscopy with Dr. Allen Norris on 11/02/2022 which showed small hiatal hernia and benign polyp in stomach with no evidence of bleeding.  She will be scheduled for capsule endoscopy.  RTC in 3 months for MD visit, labs Scheduled for second dose of Feraheme next week.  Orders Placed This Encounter  Procedures   CBC with Differential/Platelet    Standing Status:    Future    Standing Expiration Date:   11/05/2023   Ferritin    Standing Status:   Future    Standing Expiration Date:   11/06/2023   Iron and TIBC([facility])    Standing Status:   Future    Standing Expiration Date:   11/06/2023    All questions were answered. The patient knows to call the clinic with any problems, questions or concerns.  This note was electronically signed.    Jane Canary, MD  11/05/2022 2:53 PM

## 2022-11-08 ENCOUNTER — Encounter: Payer: Self-pay | Admitting: Gastroenterology

## 2022-11-09 ENCOUNTER — Encounter (INDEPENDENT_AMBULATORY_CARE_PROVIDER_SITE_OTHER): Payer: Self-pay | Admitting: Vascular Surgery

## 2022-11-09 ENCOUNTER — Ambulatory Visit (INDEPENDENT_AMBULATORY_CARE_PROVIDER_SITE_OTHER): Payer: Medicare Other | Admitting: Vascular Surgery

## 2022-11-09 VITALS — BP 149/74 | HR 60 | Resp 16 | Wt 146.4 lb

## 2022-11-09 DIAGNOSIS — I480 Paroxysmal atrial fibrillation: Secondary | ICD-10-CM | POA: Diagnosis not present

## 2022-11-09 DIAGNOSIS — E785 Hyperlipidemia, unspecified: Secondary | ICD-10-CM | POA: Diagnosis not present

## 2022-11-09 DIAGNOSIS — I7123 Aneurysm of the descending thoracic aorta, without rupture: Secondary | ICD-10-CM | POA: Diagnosis not present

## 2022-11-09 DIAGNOSIS — I10 Essential (primary) hypertension: Secondary | ICD-10-CM | POA: Diagnosis not present

## 2022-11-09 DIAGNOSIS — I712 Thoracic aortic aneurysm, without rupture, unspecified: Secondary | ICD-10-CM | POA: Insufficient documentation

## 2022-11-09 NOTE — Patient Instructions (Signed)
Thoracic Aortic Aneurysm  An aneurysm is a bulge in an artery. It happens when blood pushes up against a weakened or damaged artery wall. A thoracic aortic aneurysm is an aneurysm that occurs in the first part of the aorta, between the heart and the diaphragm. The aorta is the main artery of the body. It supplies blood from the heart to the rest of the body. Some aneurysms may not cause symptoms or problems. However, a thoracic aortic aneurysm can cause two serious problems: It can enlarge and burst (rupture). It can cause blood to flow between the layers of the wall of the aorta through a tear (aortic dissection). Both of these problems are medical emergencies. They can cause bleeding inside the body and can be life-threatening if they are not diagnosed and treated right away. What are the causes? The exact cause of this condition is not known. What increases the risk? The following factors may make you more likely to develop this condition: Being 65 years of age or older. Having a family history of aneurysms. Using tobacco. Having any of these conditions: Hardening of the arteries caused by the buildup of fat and other substances in the lining of a blood vessel (arteriosclerosis). Inflammation of the walls of an artery (arteritis). A genetic disease that weakens the body's connective tissue, such as Marfan syndrome. An injury or trauma to the aorta. High blood pressure (hypertension). High cholesterol. An infection from bacteria, such as syphilis or staphylococcus, in the wall of the aorta (infectious aortitis). What are the signs or symptoms? Symptoms of this condition vary depending on the size of the aneurysm and how fast it is growing. Most grow slowly and do not cause symptoms. When symptoms do occur, they may include: Pain in the chest, back, sides, or abdomen. The pain may vary in intensity. Sudden, severe pain may indicate that the aneurysm has  ruptured. Hoarseness. Cough. Shortness of breath. Swallowing problems. Swelling in the face, arms, or legs. Fever. Unexplained weight loss. How is this diagnosed? This condition may be diagnosed with: An ultrasound. X-rays. CT scan. MRI. A test to check the arteries for damage or blockages (angiogram). Most unruptured thoracic aortic aneurysms cause no symptoms, so they are often found during exams for other conditions. How is this treated? Treatment for this condition depends on: The size of the aneurysm. How fast the aneurysm is growing. Your age. Risk factors for rupture. Small aneurysms (2.2 inches, or 5.5 cm, or less) may be managed with: Medicines to: Control blood pressure. Manage pain. Fight infection. Regular monitoring. This may include an ultrasound or CT scan every year or every 6 months to see if the aneurysm is getting bigger. Large or fast-growing aneurysms may be treated with surgery. Follow these instructions at home: Eating and drinking  Eat a healthy diet. Your health care provider may recommend that you: Lower your salt (sodium) intake. In some people, too much salt can raise blood pressure and increase the risk for thoracic aortic aneurysm. Avoid foods that are high in saturated fat and cholesterol, such as red meat and full-fat dairy. Eat a diet that is low in sugar. Increase your fiber intake by including whole grains, vegetables, and fruits in your diet. Eating these foods may help to lower your blood pressure. Do not drink alcohol if your health care provider tells you not to drink. If you drink alcohol: Limit how much you use to: 0-1 drink a day for women who are not pregnant. 0-2 drinks a day   for men. Be aware of how much alcohol is in your drink. In the [place] one drink equals one 12 oz bottle of beer (355 mL), one 5 oz glass of wine (148 mL), or one 1 oz glass of hard liquor (44 mL). Lifestyle Do not use any products that contain nicotine or  tobacco, such as cigarettes, e-cigarettes, and chewing tobacco. If you need help quitting, ask your health care provider. Maintain a healthy weight. Check your blood pressure regularly. Follow your health care provider's instructions on how to keep your blood pressure within normal limits. Have your blood sugar (glucose) level and cholesterol levels checked regularly. Follow your health care provider's instructions on how to keep levels within normal limits. Activity  Stay physically active and exercise regularly. Talk with your health care provider about how often you should exercise and ask which types of exercise are best for you. Avoid heavy lifting and activities that take a lot of effort. Ask your health care provider what activities are safe for you. General instructions Take over-the-counter and prescription medicines only as told by your health care provider. Talk with your health care provider about regular screenings to see if the aneurysm is getting bigger. Keep all follow-up visits as told by your health care provider. This is important. Contact a health care provider if you have: Unexplained weight loss. Get help right away if you have: Pain in your upper back, neck, or abdomen. This pain may move into your chest and arms. Trouble swallowing. A cough or hoarseness. Shortness of breath. Summary A thoracic aortic aneurysm is an aneurysm that occurs in the first part of the aorta, between the heart and the diaphragm. As a thoracic aortic aneurysm becomes larger, it can burst (rupture), or blood can flow between the layers of the wall of the aorta through a tear (aorticdissection). These conditions can be life-threatening if they are not diagnosed and treated right away. If you have a thoracic aortic aneurysm, its growth will be closely monitored. Surgical repair may be needed for larger or faster-growing aneurysms. This information is not intended to replace advice given to you by  your health care provider. Make sure you discuss any questions you have with your health care provider. Document Revised: 02/23/2021 Document Reviewed: 02/23/2021 Elsevier Patient Education  2023 [employer]  

## 2022-11-09 NOTE — Assessment & Plan Note (Signed)
Her maximal diameter of her mid descending thoracic aorta measures approximately 4.9 cm.  She has significant atherosclerotic plaque and potentially a penetrating ulcer that has degenerated into an aneurysm.  There is no extravasation.  This does not continue into the abdominal aorta.  I had a long discussion with she and her husband today regarding the pathophysiology and natural history of thoracic aortic aneurysms.  This does appear that it could have been an aneurysmal degeneration of a penetrating thoracic ulcer, she has no symptoms and this was an incidental finding on the scan.  I will do a short interval follow-up of only 3 months as I would be concerned with rapid progression in this setting.  If this is a stable size over time we will follow-up less frequently.  We discussed that we generally defer repair until this reaches 5-1/2 cm at least unless there are significant symptoms of pain or embolization.  She already has good blood pressure control and does not smoke.  We will see her back with a CT scan in about 3 months.

## 2022-11-09 NOTE — Progress Notes (Signed)
Patient ID: Tara Marquez, female   DOB: [birth year], 81 y.o.   MRN: [id number]  Chief Complaint  Patient presents with   New Patient (Initial Visit)    Ref Simmons-Robinson consult dissections in descending aorta     HPI [name] is a 81 y.o. female.  I am asked to see the patient by Dr. Riki Sheer for evaluation of thoracic aortic aneurysm. She had a CT scan of the chest that I have independently reviewed. This was done for bleeding issues and her thoracic aortic pathology was an incidental finding. No obvious source of bleeding was seen.  Her maximal diameter of her mid descending thoracic aorta measures approximately 4.9 cm.  She has significant atherosclerotic plaque and potentially a penetrating ulcer that has degenerated into an aneurysm.  There is no extravasation.  This does not continue into the abdominal aorta.     Past Medical History:  Diagnosis Date   A-fib ([place])    Cancer ([place])    skin ca   Depression    mild major depression   GERD (gastroesophageal reflux disease)    Hyperlipidemia    Hypertension    Hypothyroidism     Past Surgical History:  Procedure Laterality Date   ABDOMINAL HYSTERECTOMY  03/1971   Status Post Hysterectomy   CATARACT EXTRACTION Left    COLONOSCOPY     2010   COLONOSCOPY WITH PROPOFOL N/A 01/12/2022   Procedure: COLONOSCOPY WITH PROPOFOL;  Surgeon: Lucilla Lame, MD;  Location: [facility] ENDOSCOPY;  Service: Endoscopy;  Laterality: N/A;   ESOPHAGOGASTRODUODENOSCOPY N/A 11/02/2022   Procedure: ESOPHAGOGASTRODUODENOSCOPY (EGD);  Surgeon: Lucilla Lame, MD;  Location[facility] ENDOSCOPY;  Service: Endoscopy;  Laterality: N/A;   EYE SURGERY     TONSILLECTOMY     TONSILLECTOMY AND ADENOIDECTOMY  1951   WISDOM TOOTH EXTRACTION  1970     Family History  Problem Relation Age of Onset   Cancer Sister        Has had Breast Cancer and is in remission   Breast cancer Sister 74   Congenital heart disease Son   No bleeding or clotting  disorders   Social History   Tobacco Use   Smoking status: Never   Smokeless tobacco: Never  Vaping Use   Vaping Use: Never used  Substance Use Topics   Alcohol use: No   Drug use: No     Allergies  Allergen Reactions   Sulfa Antibiotics Hives   Amoxicillin Rash   Penicillins Rash    Current Outpatient Medications  Medication Sig Dispense Refill   amiodarone (PACERONE) 200 MG tablet Take 1 tablet (200 mg total) by mouth 2 (two) times daily. (Patient taking differently: Take 100 mg by mouth daily.) 50 tablet 0   atorvastatin (LIPITOR) 40 MG tablet Take 1 tablet (40 mg total) by mouth daily at 6 PM. 30 tablet 0   levothyroxine (SYNTHROID) 75 MCG tablet Take 1 tablet (75 mcg total) by mouth daily. 90 tablet 1   metoprolol tartrate (LOPRESSOR) 25 MG tablet Take 1 tablet by mouth daily.     nitroGLYCERIN (NITROSTAT) 0.4 MG SL tablet Place 1 tablet (0.4 mg total) under the tongue every 5 (five) minutes as needed for chest pain. 30 tablet 2   rivaroxaban (XARELTO) 20 MG TABS tablet Take 1 tablet (20 mg total) by mouth daily with supper. 30 tablet 0   sertraline (ZOLOFT) 100 MG tablet TAKE 1 & 1/2 (ONE & ONE-HALF) TABLETS BY MOUTH ONCE DAILY NEED  TO  SCHEDULE  AN  APPOINTMENT 135 tablet 3   No current facility-administered medications for this visit.      REVIEW OF SYSTEMS (Negative unless checked)  Constitutional: '[]'$ Weight loss  '[]'$ Fever  '[]'$ Chills Cardiac: '[]'$ Chest pain   '[]'$ Chest pressure   '[]'$ Palpitations   '[]'$ Shortness of breath when laying flat   '[]'$ Shortness of breath at rest   '[]'$ Shortness of breath with exertion. Vascular:  '[]'$ Pain in legs with walking   '[]'$ Pain in legs at rest   '[]'$ Pain in legs when laying flat   '[]'$ Claudication   '[]'$ Pain in feet when walking  '[]'$ Pain in feet at rest  '[]'$ Pain in feet when laying flat   '[]'$ History of DVT   '[]'$ Phlebitis   '[]'$ Swelling in legs   '[]'$ Varicose veins   '[]'$ Non-healing ulcers Pulmonary:   '[]'$ Uses home oxygen   '[]'$ Productive cough   '[]'$ Hemoptysis    '[]'$ Wheeze  '[]'$ COPD   '[]'$ Asthma Neurologic:  '[]'$ Dizziness  '[]'$ Blackouts   '[]'$ Seizures   '[]'$ History of stroke   '[]'$ History of TIA  '[]'$ Aphasia   '[]'$ Temporary blindness   '[]'$ Dysphagia   '[]'$ Weakness or numbness in arms   '[]'$ Weakness or numbness in legs Musculoskeletal:  '[]'$ Arthritis   '[]'$ Joint swelling   '[]'$ Joint pain   '[]'$ Low back pain Hematologic:  '[]'$ Easy bruising  '[]'$ Easy bleeding   '[]'$ Hypercoagulable state   '[x]'$ Anemic  '[]'$ Hepatitis Gastrointestinal:  '[]'$ Blood in stool   '[]'$ Vomiting blood  '[x]'$ Gastroesophageal reflux/heartburn   '[]'$ Abdominal pain Genitourinary:  '[]'$ Chronic kidney disease   '[]'$ Difficult urination  '[]'$ Frequent urination  '[]'$ Burning with urination   '[x]'$ Hematuria Skin:  '[]'$ Rashes   '[]'$ Ulcers   '[]'$ Wounds Psychological:  '[]'$ History of anxiety   '[x]'$  History of major depression.    Physical Exam BP (!) 149/74 (BP Location: Right Arm)   Pulse 60   Resp 16   Wt 146 lb 6.4 oz (66.4 kg)   BMI 24.36 kg/m  Gen:  WD/WN, NAD.  Appears younger than stated age Head: [place]/AT, No temporalis wasting.  Ear/Nose/Throat: Hearing grossly intact, nares w/o erythema or drainage, oropharynx w/o Erythema/Exudate Eyes: Conjunctiva clear, sclera non-icteric  Neck: trachea midline.  No JVD.  Pulmonary:  Good air movement, respirations not labored, no use of accessory muscles  Cardiac: RRR, no JVD Vascular:  Vessel Right Left  Radial Palpable Palpable                                   Gastrointestinal:. No masses, surgical incisions, or scars. Musculoskeletal: M/S 5/5 throughout.  Extremities without ischemic changes.  No deformity or atrophy.  No edema. Neurologic: Sensation grossly intact in extremities.  Symmetrical.  Speech is fluent. Motor exam as listed above. Psychiatric: Judgment intact, Mood & affect appropriate for pt's clinical situation. Dermatologic: No rashes or ulcers noted.  No cellulitis or open wounds.    Radiology No results found.  Labs Recent Results (from the past 2160 hour(s))  Urinalysis,  Complete     Status: Abnormal   Collection Time: 08/18/22  2:49 PM  Result Value Ref Range   Specific Gravity, UA 1.020 1.005 - 1.030   pH, UA 5.0 5.0 - 7.5   Color, UA Yellow Yellow   Appearance Ur Cloudy (A) Clear   Leukocytes,UA 1+ (A) Negative   Protein,UA 2+ (A) Negative/Trace   Glucose, UA Negative Negative   Ketones, UA Trace (A) Negative   RBC, UA 3+ (A) Negative   Bilirubin, UA Negative Negative   Urobilinogen, Ur 1.0  0.2 - 1.0 mg/dL   Nitrite, UA Negative Negative   Microscopic Examination See below:   Microscopic Examination     Status: Abnormal   Collection Time: 08/18/22  2:49 PM   Urine  Result Value Ref Range   WBC, UA >30 (A) 0 - 5 /hpf   RBC, Urine 11-30 (A) 0 - 2 /hpf   Epithelial Cells (non renal) 0-10 0 - 10 /hpf   Casts Present (A) None seen /lpf   Cast Type Hyaline casts N/A   Bacteria, UA Many (A) None seen/Few  Urinalysis, Complete     Status: Abnormal   Collection Time: 08/19/22 10:14 AM  Result Value Ref Range   Specific Gravity, UA 1.020 1.005 - 1.030   pH, UA 5.5 5.0 - 7.5   Color, UA Red (A) Yellow   Appearance Ur Cloudy (A) Clear   Leukocytes,UA 1+ (A) Negative   Protein,UA 2+ (A) Negative/Trace   Glucose, UA Trace (A) Negative   Ketones, UA Trace (A) Negative   RBC, UA 3+ (A) Negative   Bilirubin, UA Negative Negative   Urobilinogen, Ur 1.0 0.2 - 1.0 mg/dL   Nitrite, UA Negative Negative   Microscopic Examination See below:   CULTURE, URINE COMPREHENSIVE     Status: Abnormal   Collection Time: 08/19/22 10:14 AM   Specimen: Urine   UR  Result Value Ref Range   Urine Culture, Comprehensive Final report (A)    Organism ID, Bacteria Klebsiella pneumoniae (A)     Comment: Cefazolin <=4 ug/mL Cefazolin with an MIC <=16 predicts susceptibility to the oral agents cefaclor, cefdinir, cefpodoxime, cefprozil, cefuroxime, cephalexin, and loracarbef when used for therapy of uncomplicated urinary tract infections due to E. coli, Klebsiella  pneumoniae, and Proteus mirabilis. Greater than 100,000 colony forming units per mL    Organism ID, Bacteria Not applicable    ANTIMICROBIAL SUSCEPTIBILITY Comment     Comment:       ** S = Susceptible; I = Intermediate; R = Resistant **                    P = Positive; N = Negative             MICS are expressed in micrograms per mL    Antibiotic                 RSLT#1    RSLT#2    RSLT#3    RSLT#4 Amoxicillin/Clavulanic Acid    S Ampicillin                     R Cefepime                       S Ceftriaxone                    S Cefuroxime                     S Ciprofloxacin                  S Ertapenem                      S Gentamicin                     S Imipenem  S Levofloxacin                   S Meropenem                      S Nitrofurantoin                 R Piperacillin/Tazobactam        S Tetracycline                   S Tobramycin                     S Trimethoprim/Sulfa             S   Microscopic Examination     Status: Abnormal   Collection Time: 08/19/22 10:14 AM   Urine  Result Value Ref Range   WBC, UA >30 (A) 0 - 5 /hpf   RBC, Urine >30 (A) 0 - 2 /hpf   Epithelial Cells (non renal) 0-10 0 - 10 /hpf   Bacteria, UA Many (A) None seen/Few  I-STAT creatinine     Status: Abnormal   Collection Time: 08/27/22 10:58 AM  Result Value Ref Range   Creatinine, Ser 1.20 (H) 0.44 - 1.00 mg/dL  Ferritin     Status: None   Collection Time: 09/06/22 11:10 AM  Result Value Ref Range   Ferritin 146 11 - 307 ng/mL    Comment: Performed at [facility], [address] [place], [place] [address]  Iron and TIBC([facility])     Status: None   Collection Time: 09/06/22 11:10 AM  Result Value Ref Range   Iron 70 28 - 170 ug/dL   TIBC 371 250 - 450 ug/dL   Saturation Ratios 19 10.4 - 31.8 %   UIBC 301 ug/dL    Comment: Performed at [facility], [address] [place], [place] [address]  CBC with Differential     Status:  Abnormal   Collection Time: 09/06/22 11:10 AM  Result Value Ref Range   WBC 5.6 4.0 - 10.5 K/uL   RBC 4.53 3.87 - 5.11 MIL/uL   Hemoglobin 11.9 (L) 12.0 - 15.0 g/dL   HCT 38.2 36.0 - 46.0 %   MCV 84.3 80.0 - 100.0 fL   MCH 26.3 26.0 - 34.0 pg   MCHC 31.2 30.0 - 36.0 g/dL   RDW 20.6 (H) 11.5 - 15.5 %   Platelets 150 150 - 400 K/uL   nRBC 0.0 0.0 - 0.2 %   Neutrophils Relative % 72 %   Neutro Abs 4.1 1.7 - 7.7 K/uL   Lymphocytes Relative 18 %   Lymphs Abs 1.0 0.7 - 4.0 K/uL   Monocytes Relative 7 %   Monocytes Absolute 0.4 0.1 - 1.0 K/uL   Eosinophils Relative 2 %   Eosinophils Absolute 0.1 0.0 - 0.5 K/uL   Basophils Relative 1 %   Basophils Absolute 0.1 0.0 - 0.1 K/uL   Immature Granulocytes 0 %   Abs Immature Granulocytes 0.02 0.00 - 0.07 K/uL    Comment: Performed at [facility], [address] [place],  [address]  Urinalysis, Complete     Status: Abnormal   Collection Time: 09/17/22  1:47 PM  Result Value Ref Range   Specific Gravity, UA CANCELED     Comment: Test not performed  Result canceled by the ancillary.    pH, UA CANCELED     Comment:  Test not performed  Result canceled by the ancillary.    Color, UA Red (A) Yellow    Comment: Unable to read or assay due to color interference.   Appearance Ur Cloudy (A) Clear   Protein,UA CANCELED     Comment: Test not performed  Result canceled by the ancillary.    Glucose, UA CANCELED     Comment: Test not performed  Result canceled by the ancillary.    Ketones, UA CANCELED     Comment: Test not performed  Result canceled by the ancillary.    Microscopic Examination See below:   Microscopic Examination     Status: Abnormal   Collection Time: 09/17/22  1:47 PM   Urine  Result Value Ref Range   WBC, UA 11-30 (A) 0 - 5 /hpf   RBC, Urine >30 (A) 0 - 2 /hpf   Epithelial Cells (non renal) 0-10 0 - 10 /hpf   Crystals Present (A) N/A   Crystal Type Amorphous Sediment N/A   Bacteria, UA Few None  seen/Few  CULTURE, URINE COMPREHENSIVE     Status: None   Collection Time: 09/17/22  3:56 PM   Specimen: Urine   UR  Result Value Ref Range   Urine Culture, Comprehensive Final report    Organism ID, Bacteria Comment     Comment: Mixed urogenital flora 8,000  Colonies/mL   Surgical pathology     Status: None   Collection Time: 11/02/22  9:36 AM  Result Value Ref Range   SURGICAL PATHOLOGY      SURGICAL PATHOLOGY CASE: [id number] PATIENT: Kittie Plater Surgical Pathology Report     Specimen Submitted: A. GEJ polyp; cbx  Clinical History: Iron deficiency anemia D50.9.  GEJ polyp    DIAGNOSIS: A. GASTROESOPHAGEAL JUNCTION POLYP; COLD BIOPSY: - FRAGMENTS OF BENIGN HYPERPLASTIC POLYP. - NEGATIVE FOR H. PYLORI, INTESTINAL METAPLASIA, DYSPLASIA, AND MALIGNANCY.  GROSS DESCRIPTION: A. Labeled: GEJ polyp cbx Received: Formalin Collection time: 9:36 AM on 11/02/2022 Placed into formalin time: 9:36 AM on 11/02/2022 Tissue fragment(s): 1 Size: 0.8 x 0.3 x 0.2 cm Description: Tan elongated soft tissue fragment Entirely submitted in 1 cassette.  RB 11/02/2022  Final Diagnosis performed by Allena Napoleon, MD.   Electronically signed 11/03/2022 8:56:52AM The electronic signature indicates that the named Attending Pathologist has evaluated the specimen Technical component performed at [facility], [address], [place], [place] [address] Lab: [phone] Dir: Rush Farmer, MD, MMM  Professional component performed at [facility], [facility] [place], [place], [place] [address] Lab: [phone] Dir: Kathi Simpers, MD   Iron and TIBC([facility])     Status: Abnormal   Collection Time: 11/04/22  1:09 PM  Result Value Ref Range   Iron 26 (L) 28 - 170 ug/dL   TIBC 461 (H) 250 - 450 ug/dL   Saturation Ratios 6 (L) 10.4 - 31.8 %   UIBC 435 ug/dL    Comment: Performed at [facility], [address] [place], [place] [address]  Ferritin      Status: None   Collection Time: 11/04/22  1:09 PM  Result Value Ref Range   Ferritin 17 11 - 307 ng/mL    Comment: Performed at [facility], [address] [place], [place] [address]  CBC with Differential     Status: Abnormal   Collection Time: 11/04/22  1:09 PM  Result Value Ref Range   WBC 5.6 4.0 - 10.5 K/uL   RBC 3.81 (L) 3.87 - 5.11 MIL/uL  Hemoglobin 10.2 (L) 12.0 - 15.0 g/dL   HCT 32.5 (L) 36.0 - 46.0 %   MCV 85.3 80.0 - 100.0 fL   MCH 26.8 26.0 - 34.0 pg   MCHC 31.4 30.0 - 36.0 g/dL   RDW 15.8 (H) 11.5 - 15.5 %   Platelets 138 (L) 150 - 400 K/uL   nRBC 0.0 0.0 - 0.2 %   Neutrophils Relative % 70 %   Neutro Abs 3.9 1.7 - 7.7 K/uL   Lymphocytes Relative 21 %   Lymphs Abs 1.2 0.7 - 4.0 K/uL   Monocytes Relative 7 %   Monocytes Absolute 0.4 0.1 - 1.0 K/uL   Eosinophils Relative 1 %   Eosinophils Absolute 0.1 0.0 - 0.5 K/uL   Basophils Relative 1 %   Basophils Absolute 0.0 0.0 - 0.1 K/uL   Immature Granulocytes 0 %   Abs Immature Granulocytes 0.02 0.00 - 0.07 K/uL    Comment: Performed at [facility], [address] [place], [place] [address]    Assessment/Plan:  Thoracic aortic aneurysm without rupture [facility]) Her maximal diameter of her mid descending thoracic aorta measures approximately 4.9 cm.  She has significant atherosclerotic plaque and potentially a penetrating ulcer that has degenerated into an aneurysm.  There is no extravasation.  This does not continue into the abdominal aorta.  I had a long discussion with she and her husband today regarding the pathophysiology and natural history of thoracic aortic aneurysms.  This does appear that it could have been an aneurysmal degeneration of a penetrating thoracic ulcer, she has no symptoms and this was an incidental finding on the scan.  I will do a short interval follow-up of only 3 months as I would be concerned with rapid progression in this setting.  If this is a stable size over time we will  follow-up less frequently.  We discussed that we generally defer repair until this reaches 5-1/2 cm at least unless there are significant symptoms of pain or embolization.  She already has good blood pressure control and does not smoke.  We will see her back with a CT scan in about 3 months.  Benign essential HTN blood pressure control important in reducing the progression of atherosclerotic disease and aneurysmal degeneration. On appropriate oral medications.   A-fib (HCC) On anticoagulation  HLD (hyperlipidemia) lipid control important in reducing the progression of atherosclerotic disease. Continue statin therapy      Leotis Pain 11/09/2022, 11:00 AM   This note was created with Dragon medical transcription system.  Any errors from dictation are unintentional.

## 2022-11-09 NOTE — Assessment & Plan Note (Signed)
On anticoagulation 

## 2022-11-09 NOTE — Assessment & Plan Note (Signed)
lipid control important in reducing the progression of atherosclerotic disease. Continue statin therapy  

## 2022-11-09 NOTE — Assessment & Plan Note (Signed)
blood pressure control important in reducing the progression of atherosclerotic disease and aneurysmal degeneration. On appropriate oral medications.  

## 2022-11-11 MED FILL — Ferumoxytol Inj 510 MG/17ML (30 MG/ML) (Elemental Fe): INTRAVENOUS | Qty: 17 | Status: AC

## 2022-11-12 ENCOUNTER — Inpatient Hospital Stay: Payer: Medicare Other

## 2022-11-12 VITALS — BP 140/42 | HR 56 | Temp 96.5°F | Resp 17

## 2022-11-12 DIAGNOSIS — E785 Hyperlipidemia, unspecified: Secondary | ICD-10-CM | POA: Diagnosis not present

## 2022-11-12 DIAGNOSIS — Z7901 Long term (current) use of anticoagulants: Secondary | ICD-10-CM | POA: Diagnosis not present

## 2022-11-12 DIAGNOSIS — D509 Iron deficiency anemia, unspecified: Secondary | ICD-10-CM | POA: Diagnosis not present

## 2022-11-12 DIAGNOSIS — I4891 Unspecified atrial fibrillation: Secondary | ICD-10-CM | POA: Diagnosis not present

## 2022-11-12 DIAGNOSIS — I10 Essential (primary) hypertension: Secondary | ICD-10-CM | POA: Diagnosis not present

## 2022-11-12 DIAGNOSIS — E039 Hypothyroidism, unspecified: Secondary | ICD-10-CM | POA: Diagnosis not present

## 2022-11-12 MED ORDER — SODIUM CHLORIDE 0.9 % IV SOLN
Freq: Once | INTRAVENOUS | Status: AC
  Filled 2022-11-12: qty 250

## 2022-11-12 MED ORDER — SODIUM CHLORIDE 0.9 % IV SOLN
510.0000 mg | Freq: Once | INTRAVENOUS | Status: AC
  Administered 2022-11-12: 510 mg via INTRAVENOUS
  Filled 2022-11-12: qty 510

## 2022-11-26 DIAGNOSIS — D2272 Melanocytic nevi of left lower limb, including hip: Secondary | ICD-10-CM | POA: Diagnosis not present

## 2022-11-26 DIAGNOSIS — D225 Melanocytic nevi of trunk: Secondary | ICD-10-CM | POA: Diagnosis not present

## 2022-11-26 DIAGNOSIS — D2261 Melanocytic nevi of right upper limb, including shoulder: Secondary | ICD-10-CM | POA: Diagnosis not present

## 2022-11-26 DIAGNOSIS — C44722 Squamous cell carcinoma of skin of right lower limb, including hip: Secondary | ICD-10-CM | POA: Diagnosis not present

## 2022-11-26 DIAGNOSIS — D485 Neoplasm of uncertain behavior of skin: Secondary | ICD-10-CM | POA: Diagnosis not present

## 2022-11-26 DIAGNOSIS — D2262 Melanocytic nevi of left upper limb, including shoulder: Secondary | ICD-10-CM | POA: Diagnosis not present

## 2022-11-26 DIAGNOSIS — D2271 Melanocytic nevi of right lower limb, including hip: Secondary | ICD-10-CM | POA: Diagnosis not present

## 2022-11-26 DIAGNOSIS — L821 Other seborrheic keratosis: Secondary | ICD-10-CM | POA: Diagnosis not present

## 2022-12-07 ENCOUNTER — Telehealth: Payer: Self-pay | Admitting: Gastroenterology

## 2022-12-07 ENCOUNTER — Ambulatory Visit: Payer: Medicare Other | Admitting: Gastroenterology

## 2022-12-07 NOTE — Telephone Encounter (Signed)
Per pt phone  call needs appt for swallowing test?  I see a note for medical clearance . Pt ask if you could call her back. She said she left two messages at some number but not sure what or who she left it with.

## 2022-12-08 ENCOUNTER — Other Ambulatory Visit: Payer: Self-pay

## 2022-12-08 DIAGNOSIS — D509 Iron deficiency anemia, unspecified: Secondary | ICD-10-CM

## 2022-12-22 ENCOUNTER — Ambulatory Visit
Admission: RE | Admit: 2022-12-22 | Discharge: 2022-12-22 | Disposition: A | Discharge status: Home / Self Care | Payer: Medicare Other | Attending: Gastroenterology | Admitting: Gastroenterology

## 2022-12-22 ENCOUNTER — Encounter: Payer: Self-pay | Admitting: General Practice

## 2022-12-22 ENCOUNTER — Encounter
Admission: RE | Disposition: A | Discharge status: Home / Self Care | Payer: Self-pay | Source: Home / Self Care | Attending: Gastroenterology

## 2022-12-22 DIAGNOSIS — Z029 Encounter for administrative examinations, unspecified: Secondary | ICD-10-CM | POA: Diagnosis present

## 2022-12-22 HISTORY — PX: GIVENS CAPSULE STUDY: SHX5432

## 2022-12-22 SURGERY — IMAGING PROCEDURE, GI TRACT, INTRALUMINAL, VIA CAPSULE

## 2022-12-23 ENCOUNTER — Encounter: Payer: Self-pay | Admitting: Gastroenterology

## 2023-01-04 DIAGNOSIS — I493 Ventricular premature depolarization: Secondary | ICD-10-CM | POA: Diagnosis not present

## 2023-01-04 DIAGNOSIS — E782 Mixed hyperlipidemia: Secondary | ICD-10-CM | POA: Diagnosis not present

## 2023-01-04 DIAGNOSIS — I480 Paroxysmal atrial fibrillation: Secondary | ICD-10-CM | POA: Diagnosis not present

## 2023-01-04 DIAGNOSIS — I10 Essential (primary) hypertension: Secondary | ICD-10-CM | POA: Diagnosis not present

## 2023-01-04 DIAGNOSIS — D500 Iron deficiency anemia secondary to blood loss (chronic): Secondary | ICD-10-CM | POA: Diagnosis not present

## 2023-01-07 ENCOUNTER — Telehealth: Payer: Self-pay

## 2023-01-07 NOTE — Telephone Encounter (Signed)
Requesting for capsule study results

## 2023-01-13 ENCOUNTER — Inpatient Hospital Stay: Payer: Medicare Other | Admitting: Certified Registered"

## 2023-01-13 ENCOUNTER — Other Ambulatory Visit: Payer: Self-pay

## 2023-01-13 ENCOUNTER — Inpatient Hospital Stay: Payer: Medicare Other

## 2023-01-13 ENCOUNTER — Encounter
Admission: EM | Disposition: A | Discharge status: Home / Self Care | Payer: Self-pay | Source: Home / Self Care | Attending: Student

## 2023-01-13 ENCOUNTER — Encounter: Payer: Self-pay | Admitting: Internal Medicine

## 2023-01-13 ENCOUNTER — Emergency Department: Payer: Medicare Other

## 2023-01-13 ENCOUNTER — Inpatient Hospital Stay
Admission: EM | Admit: 2023-01-13 | Discharge: 2023-01-22 | DRG: 659 | Disposition: A | Discharge status: Home / Self Care | Payer: Medicare Other | Attending: Family Medicine | Admitting: Family Medicine

## 2023-01-13 ENCOUNTER — Encounter: Payer: Self-pay | Admitting: Emergency Medicine

## 2023-01-13 DIAGNOSIS — N1832 Chronic kidney disease, stage 3b: Secondary | ICD-10-CM | POA: Diagnosis present

## 2023-01-13 DIAGNOSIS — D72829 Elevated white blood cell count, unspecified: Secondary | ICD-10-CM

## 2023-01-13 DIAGNOSIS — R8281 Pyuria: Secondary | ICD-10-CM

## 2023-01-13 DIAGNOSIS — N3289 Other specified disorders of bladder: Secondary | ICD-10-CM | POA: Diagnosis present

## 2023-01-13 DIAGNOSIS — N200 Calculus of kidney: Secondary | ICD-10-CM

## 2023-01-13 DIAGNOSIS — F32A Depression, unspecified: Secondary | ICD-10-CM | POA: Diagnosis present

## 2023-01-13 DIAGNOSIS — E876 Hypokalemia: Secondary | ICD-10-CM | POA: Diagnosis not present

## 2023-01-13 DIAGNOSIS — N202 Calculus of kidney with calculus of ureter: Secondary | ICD-10-CM | POA: Diagnosis present

## 2023-01-13 DIAGNOSIS — Z7901 Long term (current) use of anticoagulants: Secondary | ICD-10-CM

## 2023-01-13 DIAGNOSIS — K591 Functional diarrhea: Secondary | ICD-10-CM | POA: Diagnosis not present

## 2023-01-13 DIAGNOSIS — D62 Acute posthemorrhagic anemia: Secondary | ICD-10-CM | POA: Diagnosis not present

## 2023-01-13 DIAGNOSIS — R0902 Hypoxemia: Secondary | ICD-10-CM | POA: Diagnosis not present

## 2023-01-13 DIAGNOSIS — E039 Hypothyroidism, unspecified: Secondary | ICD-10-CM | POA: Diagnosis present

## 2023-01-13 DIAGNOSIS — Z79890 Hormone replacement therapy (postmenopausal): Secondary | ICD-10-CM

## 2023-01-13 DIAGNOSIS — B961 Klebsiella pneumoniae [K. pneumoniae] as the cause of diseases classified elsewhere: Secondary | ICD-10-CM | POA: Diagnosis present

## 2023-01-13 DIAGNOSIS — N201 Calculus of ureter: Secondary | ICD-10-CM

## 2023-01-13 DIAGNOSIS — Z1152 Encounter for screening for COVID-19: Secondary | ICD-10-CM | POA: Diagnosis not present

## 2023-01-13 DIAGNOSIS — I129 Hypertensive chronic kidney disease with stage 1 through stage 4 chronic kidney disease, or unspecified chronic kidney disease: Secondary | ICD-10-CM | POA: Diagnosis present

## 2023-01-13 DIAGNOSIS — R197 Diarrhea, unspecified: Secondary | ICD-10-CM

## 2023-01-13 DIAGNOSIS — N10 Acute tubulo-interstitial nephritis: Secondary | ICD-10-CM | POA: Diagnosis not present

## 2023-01-13 DIAGNOSIS — E871 Hypo-osmolality and hyponatremia: Secondary | ICD-10-CM | POA: Diagnosis present

## 2023-01-13 DIAGNOSIS — R109 Unspecified abdominal pain: Secondary | ICD-10-CM | POA: Diagnosis not present

## 2023-01-13 DIAGNOSIS — N138 Other obstructive and reflux uropathy: Secondary | ICD-10-CM | POA: Diagnosis not present

## 2023-01-13 DIAGNOSIS — E559 Vitamin D deficiency, unspecified: Secondary | ICD-10-CM | POA: Diagnosis present

## 2023-01-13 DIAGNOSIS — R000 Tachycardia, unspecified: Secondary | ICD-10-CM | POA: Diagnosis not present

## 2023-01-13 DIAGNOSIS — N23 Unspecified renal colic: Secondary | ICD-10-CM | POA: Diagnosis not present

## 2023-01-13 DIAGNOSIS — I700 Atherosclerosis of aorta: Secondary | ICD-10-CM | POA: Diagnosis present

## 2023-01-13 DIAGNOSIS — N210 Calculus in bladder: Secondary | ICD-10-CM | POA: Diagnosis not present

## 2023-01-13 DIAGNOSIS — E860 Dehydration: Secondary | ICD-10-CM | POA: Diagnosis present

## 2023-01-13 DIAGNOSIS — R54 Age-related physical debility: Secondary | ICD-10-CM | POA: Diagnosis present

## 2023-01-13 DIAGNOSIS — N139 Obstructive and reflux uropathy, unspecified: Principal | ICD-10-CM

## 2023-01-13 DIAGNOSIS — E785 Hyperlipidemia, unspecified: Secondary | ICD-10-CM | POA: Diagnosis present

## 2023-01-13 DIAGNOSIS — R0602 Shortness of breath: Secondary | ICD-10-CM | POA: Diagnosis not present

## 2023-01-13 DIAGNOSIS — I480 Paroxysmal atrial fibrillation: Secondary | ICD-10-CM

## 2023-01-13 DIAGNOSIS — N136 Pyonephrosis: Principal | ICD-10-CM | POA: Diagnosis present

## 2023-01-13 DIAGNOSIS — I7123 Aneurysm of the descending thoracic aorta, without rupture: Secondary | ICD-10-CM | POA: Diagnosis present

## 2023-01-13 DIAGNOSIS — R112 Nausea with vomiting, unspecified: Secondary | ICD-10-CM

## 2023-01-13 DIAGNOSIS — I4891 Unspecified atrial fibrillation: Secondary | ICD-10-CM | POA: Diagnosis present

## 2023-01-13 DIAGNOSIS — Z880 Allergy status to penicillin: Secondary | ICD-10-CM

## 2023-01-13 DIAGNOSIS — I959 Hypotension, unspecified: Secondary | ICD-10-CM | POA: Diagnosis not present

## 2023-01-13 DIAGNOSIS — E538 Deficiency of other specified B group vitamins: Secondary | ICD-10-CM | POA: Diagnosis present

## 2023-01-13 DIAGNOSIS — N12 Tubulo-interstitial nephritis, not specified as acute or chronic: Secondary | ICD-10-CM

## 2023-01-13 DIAGNOSIS — Z9842 Cataract extraction status, left eye: Secondary | ICD-10-CM

## 2023-01-13 DIAGNOSIS — N179 Acute kidney failure, unspecified: Secondary | ICD-10-CM

## 2023-01-13 DIAGNOSIS — Z91013 Allergy to seafood: Secondary | ICD-10-CM

## 2023-01-13 DIAGNOSIS — K219 Gastro-esophageal reflux disease without esophagitis: Secondary | ICD-10-CM | POA: Diagnosis present

## 2023-01-13 DIAGNOSIS — J189 Pneumonia, unspecified organism: Secondary | ICD-10-CM | POA: Diagnosis not present

## 2023-01-13 DIAGNOSIS — Z79899 Other long term (current) drug therapy: Secondary | ICD-10-CM

## 2023-01-13 DIAGNOSIS — I10 Essential (primary) hypertension: Secondary | ICD-10-CM | POA: Diagnosis not present

## 2023-01-13 HISTORY — PX: CYSTOSCOPY WITH RETROGRADE PYELOGRAM, URETEROSCOPY AND STENT PLACEMENT: SHX5789

## 2023-01-13 LAB — URINALYSIS, ROUTINE W REFLEX MICROSCOPIC
Bilirubin Urine: NEGATIVE
Glucose, UA: NEGATIVE mg/dL
Ketones, ur: 5 mg/dL — AB
Nitrite: NEGATIVE
Protein, ur: 100 mg/dL — AB
RBC / HPF: >50 RBC/hpf — ABNORMAL HIGH (ref 0–5)
Specific Gravity, Urine: 1.044 — ABNORMAL HIGH (ref 1.005–1.030)
Squamous Epithelial / HPF: NONE SEEN /HPF (ref 0–5)
WBC, UA: >50 WBC/hpf — ABNORMAL HIGH (ref 0–5)
pH: 6 (ref 5.0–8.0)

## 2023-01-13 LAB — COMPREHENSIVE METABOLIC PANEL
ALT: 42 U/L (ref 0–44)
AST: 47 U/L — ABNORMAL HIGH (ref 15–41)
Albumin: 2.7 g/dL — ABNORMAL LOW (ref 3.5–5.0)
Alkaline Phosphatase: 60 U/L (ref 38–126)
Anion gap: 11 (ref 5–15)
BUN: 28 mg/dL — ABNORMAL HIGH (ref 8–23)
CO2: 23 mmol/L (ref 22–32)
Calcium: 7.9 mg/dL — ABNORMAL LOW (ref 8.9–10.3)
Chloride: 97 mmol/L — ABNORMAL LOW (ref 98–111)
Creatinine, Ser: 1.45 mg/dL — ABNORMAL HIGH (ref 0.44–1.00)
GFR, Estimated: 36 mL/min — ABNORMAL LOW (ref >60)
Glucose, Bld: 142 mg/dL — ABNORMAL HIGH (ref 70–99)
Potassium: 2.8 mmol/L — ABNORMAL LOW (ref 3.5–5.1)
Sodium: 131 mmol/L — ABNORMAL LOW (ref 135–145)
Total Bilirubin: 1.4 mg/dL — ABNORMAL HIGH (ref 0.3–1.2)
Total Protein: 6 g/dL — ABNORMAL LOW (ref 6.5–8.1)

## 2023-01-13 LAB — LACTIC ACID, PLASMA: Lactic Acid, Venous: 1.4 mmol/L (ref 0.5–1.9)

## 2023-01-13 LAB — LIPASE, BLOOD: Lipase: 49 U/L (ref 11–51)

## 2023-01-13 LAB — CBC
HCT: 31.2 % — ABNORMAL LOW (ref 36.0–46.0)
Hemoglobin: 9.9 g/dL — ABNORMAL LOW (ref 12.0–15.0)
MCH: 26 pg (ref 26.0–34.0)
MCHC: 31.7 g/dL (ref 30.0–36.0)
MCV: 81.9 fL (ref 80.0–100.0)
Platelets: 126 10*3/uL — ABNORMAL LOW (ref 150–400)
RBC: 3.81 MIL/uL — ABNORMAL LOW (ref 3.87–5.11)
RDW: 14.8 % (ref 11.5–15.5)
WBC: 7.7 10*3/uL (ref 4.0–10.5)
nRBC: 0 % (ref 0.0–0.2)

## 2023-01-13 LAB — RESP PANEL BY RT-PCR (RSV, FLU A&B, COVID)  RVPGX2
Influenza A by PCR: NEGATIVE
Influenza B by PCR: NEGATIVE
Resp Syncytial Virus by PCR: NEGATIVE
SARS Coronavirus 2 by RT PCR: NEGATIVE

## 2023-01-13 LAB — MAGNESIUM: Magnesium: 1.9 mg/dL (ref 1.7–2.4)

## 2023-01-13 SURGERY — CYSTOURETEROSCOPY, WITH RETROGRADE PYELOGRAM AND STENT INSERTION
Anesthesia: General | Laterality: Left

## 2023-01-13 MED ORDER — LACTATED RINGERS IV BOLUS: 1000.0000 mL | Freq: Once | INTRAVENOUS | Status: DC

## 2023-01-13 MED ORDER — HYDROCODONE-ACETAMINOPHEN 5-325 MG PO TABS
1.0000 | ORAL_TABLET | Freq: Four times a day (QID) | ORAL | Status: DC | PRN
  Administered 2023-01-13 – 2023-01-15 (×5): 1 via ORAL
  Filled 2023-01-13 (×5): qty 1

## 2023-01-13 MED ORDER — IOHEXOL 180 MG/ML  SOLN
INTRAMUSCULAR | Status: DC | PRN
  Administered 2023-01-13: 10 mL

## 2023-01-13 MED ORDER — HYDROMORPHONE HCL 1 MG/ML IJ SOLN: 0.5000 mg | INTRAMUSCULAR | Status: DC | PRN

## 2023-01-13 MED ORDER — LIDOCAINE HCL (PF) 2 % IJ SOLN
INTRAMUSCULAR | Status: AC
  Filled 2023-01-13: qty 5

## 2023-01-13 MED ORDER — LIDOCAINE HCL URETHRAL/MUCOSAL 2 % EX GEL
CUTANEOUS | Status: AC
  Filled 2023-01-13: qty 10

## 2023-01-13 MED ORDER — FENTANYL CITRATE (PF) 100 MCG/2ML IJ SOLN
INTRAMUSCULAR | Status: DC | PRN
  Administered 2023-01-13 (×2): 25 ug via INTRAVENOUS

## 2023-01-13 MED ORDER — LACTATED RINGERS IV SOLN: INTRAVENOUS | Status: DC | PRN

## 2023-01-13 MED ORDER — ACETAMINOPHEN 325 MG PO TABS: 650.0000 mg | ORAL_TABLET | Freq: Four times a day (QID) | ORAL | Status: DC | PRN

## 2023-01-13 MED ORDER — ONDANSETRON HCL 4 MG/2ML IJ SOLN
INTRAMUSCULAR | Status: AC
  Administered 2023-01-13: 4 mg via INTRAVENOUS
  Filled 2023-01-13: qty 2

## 2023-01-13 MED ORDER — PHENYLEPHRINE 80 MCG/ML (10ML) SYRINGE FOR IV PUSH (FOR BLOOD PRESSURE SUPPORT)
PREFILLED_SYRINGE | INTRAVENOUS | Status: DC | PRN
  Administered 2023-01-13 (×4): 120 ug via INTRAVENOUS
  Administered 2023-01-13: 200 ug via INTRAVENOUS
  Administered 2023-01-13: 120 ug via INTRAVENOUS
  Administered 2023-01-13: 80 ug via INTRAVENOUS

## 2023-01-13 MED ORDER — PROPOFOL 10 MG/ML IV BOLUS
INTRAVENOUS | Status: AC
  Filled 2023-01-13: qty 20

## 2023-01-13 MED ORDER — POTASSIUM CHLORIDE 10 MEQ/100ML IV SOLN
10.0000 meq | Freq: Once | INTRAVENOUS | Status: AC
  Administered 2023-01-13: 10 meq via INTRAVENOUS
  Filled 2023-01-13: qty 100

## 2023-01-13 MED ORDER — IOHEXOL 300 MG/ML  SOLN
75.0000 mL | Freq: Once | INTRAMUSCULAR | Status: AC | PRN
  Administered 2023-01-13: 75 mL via INTRAVENOUS

## 2023-01-13 MED ORDER — POLYETHYLENE GLYCOL 3350 17 G PO PACK: 17.0000 g | PACK | Freq: Every day | ORAL | Status: DC | PRN

## 2023-01-13 MED ORDER — SERTRALINE HCL 50 MG PO TABS
150.0000 mg | ORAL_TABLET | Freq: Every day | ORAL | Status: DC
  Administered 2023-01-14 – 2023-01-22 (×9): 150 mg via ORAL
  Filled 2023-01-13 (×10): qty 3

## 2023-01-13 MED ORDER — POTASSIUM CHLORIDE 10 MEQ/100ML IV SOLN
10.0000 meq | INTRAVENOUS | Status: AC
  Administered 2023-01-13 (×2): 10 meq via INTRAVENOUS
  Filled 2023-01-13 (×2): qty 100

## 2023-01-13 MED ORDER — SODIUM CHLORIDE 0.9 % IV SOLN
1.0000 g | Freq: Two times a day (BID) | INTRAVENOUS | Status: DC
  Administered 2023-01-13 – 2023-01-15 (×4): 1 g via INTRAVENOUS
  Filled 2023-01-13: qty 1
  Filled 2023-01-13 (×2): qty 20
  Filled 2023-01-13: qty 1

## 2023-01-13 MED ORDER — METOPROLOL SUCCINATE ER 25 MG PO TB24
25.0000 mg | ORAL_TABLET | Freq: Every day | ORAL | Status: DC
  Administered 2023-01-13 – 2023-01-14 (×2): 25 mg via ORAL
  Filled 2023-01-13 (×2): qty 1

## 2023-01-13 MED ORDER — PROPOFOL 10 MG/ML IV BOLUS
INTRAVENOUS | Status: DC | PRN
  Administered 2023-01-13 (×9): 25 mg via INTRAVENOUS

## 2023-01-13 MED ORDER — DEXMEDETOMIDINE HCL IN NACL 80 MCG/20ML IV SOLN
INTRAVENOUS | Status: DC | PRN
  Administered 2023-01-13 (×2): 8 ug via BUCCAL

## 2023-01-13 MED ORDER — LIDOCAINE HCL (CARDIAC) PF 100 MG/5ML IV SOSY
PREFILLED_SYRINGE | INTRAVENOUS | Status: DC | PRN
  Administered 2023-01-13: 30 mg via INTRAVENOUS

## 2023-01-13 MED ORDER — ACETAMINOPHEN 650 MG RE SUPP: 650.0000 mg | Freq: Four times a day (QID) | RECTAL | Status: DC | PRN

## 2023-01-13 MED ORDER — LACTATED RINGERS IV SOLN: INTRAVENOUS | Status: AC

## 2023-01-13 MED ORDER — FENTANYL CITRATE (PF) 100 MCG/2ML IJ SOLN
INTRAMUSCULAR | Status: AC
  Filled 2023-01-13: qty 2

## 2023-01-13 MED ORDER — ATORVASTATIN CALCIUM 20 MG PO TABS
40.0000 mg | ORAL_TABLET | Freq: Every day | ORAL | Status: DC
  Administered 2023-01-14 – 2023-01-21 (×8): 40 mg via ORAL
  Filled 2023-01-13 (×8): qty 2

## 2023-01-13 MED ORDER — SODIUM CHLORIDE 0.9 % IV BOLUS
500.0000 mL | Freq: Once | INTRAVENOUS | Status: AC
  Administered 2023-01-13: 500 mL via INTRAVENOUS

## 2023-01-13 MED ORDER — PHENYLEPHRINE 80 MCG/ML (10ML) SYRINGE FOR IV PUSH (FOR BLOOD PRESSURE SUPPORT)
PREFILLED_SYRINGE | INTRAVENOUS | Status: AC
  Filled 2023-01-13: qty 10

## 2023-01-13 MED ORDER — LEVOTHYROXINE SODIUM 50 MCG PO TABS
75.0000 ug | ORAL_TABLET | Freq: Every day | ORAL | Status: DC
  Administered 2023-01-14 – 2023-01-22 (×9): 75 ug via ORAL
  Filled 2023-01-13 (×9): qty 1

## 2023-01-13 MED ORDER — FENTANYL CITRATE (PF) 100 MCG/2ML IJ SOLN: 25.0000 ug | INTRAMUSCULAR | Status: DC | PRN

## 2023-01-13 MED ORDER — ONDANSETRON HCL 4 MG/2ML IJ SOLN: 4.0000 mg | Freq: Once | INTRAMUSCULAR | Status: DC | PRN

## 2023-01-13 MED ORDER — ONDANSETRON HCL 4 MG/2ML IJ SOLN: 4.0000 mg | Freq: Once | INTRAMUSCULAR | Status: AC

## 2023-01-13 MED ORDER — SODIUM CHLORIDE 0.9 % IR SOLN
Status: DC | PRN
  Administered 2023-01-13: 3000 mL

## 2023-01-13 MED ORDER — ONDANSETRON HCL 4 MG PO TABS: 4.0000 mg | ORAL_TABLET | Freq: Four times a day (QID) | ORAL | Status: DC | PRN

## 2023-01-13 MED ORDER — POTASSIUM CHLORIDE CRYS ER 20 MEQ PO TBCR: 20.0000 meq | EXTENDED_RELEASE_TABLET | Freq: Once | ORAL | Status: DC

## 2023-01-13 MED ORDER — ONDANSETRON HCL 4 MG/2ML IJ SOLN
4.0000 mg | Freq: Four times a day (QID) | INTRAMUSCULAR | Status: DC | PRN
  Administered 2023-01-19: 4 mg via INTRAVENOUS
  Filled 2023-01-13 (×2): qty 2

## 2023-01-13 MED ORDER — LOPERAMIDE HCL 2 MG PO CAPS
2.0000 mg | ORAL_CAPSULE | ORAL | Status: DC | PRN
  Administered 2023-01-16 – 2023-01-19 (×3): 2 mg via ORAL
  Filled 2023-01-13 (×3): qty 1

## 2023-01-13 MED ORDER — SODIUM CHLORIDE 0.9 % IV SOLN
1.0000 g | INTRAVENOUS | Status: DC
  Filled 2023-01-13: qty 10

## 2023-01-13 MED ORDER — LIDOCAINE HCL URETHRAL/MUCOSAL 2 % EX GEL
CUTANEOUS | Status: DC | PRN
  Administered 2023-01-13: 1 via URETHRAL

## 2023-01-13 MED ORDER — AMIODARONE HCL 200 MG PO TABS
200.0000 mg | ORAL_TABLET | Freq: Every day | ORAL | Status: DC
  Administered 2023-01-14 – 2023-01-22 (×9): 200 mg via ORAL
  Filled 2023-01-13 (×10): qty 1

## 2023-01-13 SURGICAL SUPPLY — 20 items
BAG DRAIN SIEMENS DORNER NS (MISCELLANEOUS) ×1 IMPLANT
BRUSH SCRUB EZ 1% IODOPHOR (MISCELLANEOUS) ×1 IMPLANT
CATH URETL OPEN 5X70 (CATHETERS) IMPLANT
GAUZE 4X4 16PLY ~~LOC~~+RFID DBL (SPONGE) ×2 IMPLANT
GLOVE SURG UNDER POLY LF SZ7.5 (GLOVE) ×1 IMPLANT
GOWN STRL REUS W/ TWL XL LVL3 (GOWN DISPOSABLE) ×1 IMPLANT
GOWN STRL REUS W/TWL XL LVL3 (GOWN DISPOSABLE) ×1
GUIDEWIRE STR DUAL SENSOR (WIRE) ×1 IMPLANT
GUIDEWIRE STR ZIPWIRE 035X150 (MISCELLANEOUS) IMPLANT
IV NS IRRIG 3000ML ARTHROMATIC (IV SOLUTION) ×1 IMPLANT
KIT TURNOVER CYSTO (KITS) ×1 IMPLANT
MANIFOLD NEPTUNE II (INSTRUMENTS) ×1 IMPLANT
PACK CYSTO AR (MISCELLANEOUS) ×1 IMPLANT
SET CYSTO W/LG BORE CLAMP LF (SET/KITS/TRAYS/PACK) ×1 IMPLANT
STENT URET 6FRX24 CONTOUR (STENTS) IMPLANT
STENT URET 6FRX26 CONTOUR (STENTS) IMPLANT
SURGILUBE 2OZ TUBE FLIPTOP (MISCELLANEOUS) ×1 IMPLANT
TRAP FLUID SMOKE EVACUATOR (MISCELLANEOUS) ×1 IMPLANT
WATER STERILE IRR 1000ML POUR (IV SOLUTION) ×1 IMPLANT
WATER STERILE IRR 500ML POUR (IV SOLUTION) ×1 IMPLANT

## 2023-01-13 NOTE — Assessment & Plan Note (Addendum)
CT imaging with evidence of pyelonephritis in the setting of obstructive uropathy.  Given high risk for sepsis, will cover broadly with meropenem for now.  - Meropenem per pharmacy dosing - Urine culture pending - Will narrow antimicrobials based off culture data - IV fluids - Norco and Dilaudid for pain control - Zofran as needed for nausea

## 2023-01-13 NOTE — Anesthesia Procedure Notes (Signed)
Procedure Name: MAC Date/Time: 01/13/2023 6:50 PM  Performed by: Esaw Grandchild, CRNAPre-anesthesia Checklist: Patient identified, Emergency Drugs available, Patient being monitored and Suction available Patient Re-evaluated:Patient Re-evaluated prior to induction Oxygen Delivery Method: Simple face mask

## 2023-01-13 NOTE — H&P (Signed)
History and Physical    Patient: Tara Marquez DOB: 01-27-1941 DOA: 01/13/2023 DOS: the patient was seen and examined on 01/13/2023 PCP: Jerrol Banana., MD  Patient coming from: Home  Chief Complaint:  Chief Complaint  Patient presents with   Diarrhea   HPI: Tara Marquez is a 82 y.o. female with medical history significant of atrial fibrillation, iron deficiency anemia, chronic hematuria of unknown etiology, hypertension, hyperlipidemia, who presents to the ED due to diarrhea.  Tara Marquez states that for the last 5 days, she has been experiencing profound generalized malaise, nausea, vomiting, and lack of appetite.  Then 2 days ago, she noticed diarrhea that was nonbloody, nonmelanotic.  In addition, she endorses intermittent abdominal pain.  She has not had much to eat or drink in the last 5 days due to the symptoms.  She denies any fever, chills.  She states that she was told over the last couple months that she has had small kidney stone seen on imaging, but she denies any history of kidney stones prior to that.  ED course: On arrival to the ED, patient was normotensive at 114/72 with heart rate of 75.  She was saturating at 95% on room air.  Initial workup remarkable for potassium of 2.8, BUN of 28, creatinine 1.45, calcium of 7.9, albumin of 2.7, AST 47, total bilirubin of 1.4 and GFR of 36.  CBC with normal WBC is 7.7 but hemoglobin of 9.9 and platelets of 126.  Lactic acid within normal limits.  Urinalysis was obtained that demonstrates large hemoglobin, ketonuria, moderate leukocytes, many bacteria, and over 50 RBC and WBC per hpf. CT abdomen/pelvis was obtained that demonstrated an obstructing 7 mm distal left ureteral stone with high-grade left-sided obstructive uropathy and perinephritic fat stranding consistent with superimposed left-sided pyelonephritis.  Due to this, [facility] contacted for admission and urology has been consulted.  Review of Systems: As mentioned  in the history of present illness. All other systems reviewed and are negative. Past Medical History:  Diagnosis Date   A-fib ([place])    Cancer ([place])    skin ca   Depression    mild major depression   GERD (gastroesophageal reflux disease)    Hyperlipidemia    Hypertension    Hypothyroidism    Past Surgical History:  Procedure Laterality Date   ABDOMINAL HYSTERECTOMY  03/1971   Status Post Hysterectomy   CATARACT EXTRACTION Left    COLONOSCOPY     2010   COLONOSCOPY WITH PROPOFOL N/A 01/12/2022   Procedure: COLONOSCOPY WITH PROPOFOL;  Surgeon: Lucilla Lame, MD;  Location: [facility] ENDOSCOPY;  Service: Endoscopy;  Laterality: N/A;   ESOPHAGOGASTRODUODENOSCOPY N/A 11/02/2022   Procedure: ESOPHAGOGASTRODUODENOSCOPY (EGD);  Surgeon: Lucilla Lame, MD;  Location: [facility] ENDOSCOPY;  Service: Endoscopy;  Laterality: N/A;   EYE SURGERY     GIVENS CAPSULE STUDY N/A 12/22/2022   Procedure: GIVENS CAPSULE STUDY;  Surgeon: Lucilla Lame, MD;  Location: [facility] ENDOSCOPY;  Service: Endoscopy;  Laterality: N/A;   TONSILLECTOMY     TONSILLECTOMY AND ADENOIDECTOMY  1951   [place] EXTRACTION  1970   Social History:  reports that she has never smoked. She has never used smokeless tobacco. She reports that she does not drink alcohol and does not use drugs.  Allergies  Allergen Reactions   Sulfa Antibiotics Hives   Amoxicillin Rash   Penicillins Rash    Family History  Problem Relation Age of Onset   Cancer Sister  Has had Breast Cancer and is in remission   Breast cancer Sister 45   Congenital heart disease Son     Prior to Admission medications   Medication Sig Start Date End Date Taking? Authorizing Provider  amiodarone (PACERONE) 200 MG tablet Take 1 tablet (200 mg total) by mouth 2 (two) times daily. Patient taking differently: Take 100 mg by mouth daily. 10/26/18   Vaughan Basta, MD  atorvastatin (LIPITOR) 40 MG tablet Take 1 tablet (40 mg total) by mouth daily at 6 PM.  10/22/18   Vaughan Basta, MD  levothyroxine (SYNTHROID) 75 MCG tablet Take 1 tablet (75 mcg total) by mouth daily. 10/27/22   Simmons-Robinson, Makiera, MD  metoprolol tartrate (LOPRESSOR) 25 MG tablet Take 1 tablet by mouth daily.    [provider]  nitroGLYCERIN (NITROSTAT) 0.4 MG SL tablet Place 1 tablet (0.4 mg total) under the tongue every 5 (five) minutes as needed for chest pain. 10/20/18   Demetrios Loll, MD  rivaroxaban (XARELTO) 20 MG TABS tablet Take 1 tablet (20 mg total) by mouth daily with supper. 10/22/18   Vaughan Basta, MD  sertraline (ZOLOFT) 100 MG tablet TAKE 1 & 1/2 (ONE & ONE-HALF) TABLETS BY MOUTH ONCE DAILY NEED  TO  SCHEDULE  AN  APPOINTMENT 11/10/21   Jerrol Banana., MD    Physical Exam: Vitals:   01/13/23 1316 01/13/23 1401 01/13/23 1805  BP: 114/72  112/70  Pulse: 75  76  Resp: 19  18  Temp: 98 F (36.7 C)  98 F (36.7 C)  TempSrc:   Oral  SpO2: 95%  96%  Weight:  66.4 kg   Height:  '5\' 5"'$  (1.651 m)    Physical Exam Vitals and nursing note reviewed.  Constitutional:      General: She is not in acute distress.    Appearance: She is normal weight. She is not toxic-appearing.  HENT:     Head: Normocephalic and atraumatic.     Mouth/Throat:     Mouth: Mucous membranes are moist.     Pharynx: Oropharynx is clear.  Eyes:     Conjunctiva/sclera: Conjunctivae normal.     Pupils: Pupils are equal, round, and reactive to light.  Cardiovascular:     Rate and Rhythm: Normal rate. Rhythm irregular.     Heart sounds: No murmur heard.    No gallop.  Pulmonary:     Effort: Pulmonary effort is normal. No respiratory distress.     Breath sounds: Normal breath sounds. No wheezing, rhonchi or rales.  Abdominal:     General: There is no distension.     Palpations: Abdomen is soft.     Tenderness: There is abdominal tenderness (Mild tenderness to palpation in the suprapubic area). There is no guarding.  Musculoskeletal:     Right  lower leg: No edema.     Left lower leg: No edema.  Skin:    General: Skin is warm and dry.  Neurological:     General: No focal deficit present.     Mental Status: She is alert and oriented to person, place, and time. Mental status is at baseline.  Psychiatric:        Mood and Affect: Mood normal.        Behavior: Behavior normal.    Data Reviewed: CBC with WBC of 7.7, hemoglobin of 9.9, platelets of 126 CMP with sodium of 131, potassium 2.8, chloride 97, bicarb 23, glucose of 142, BUN of 28, creatinine 1.45, calcium 7.9,  anion gap 11, magnesium 1.9, alkaline phosphatase 60, albumin 2.7, AST 47, total protein 6.0, total bili 1.4, and GFR of 36. Lactic acid within normal limits at 1.4 Urinalysis with amber color, large hemoglobin, ketonuria, moderate leukocytes, proteinuria, and many bacteria in addition to over 50 WBC and RBC per high-powered field. COVID-19, influenza and RSV PCR negative  CT Abdomen Pelvis W Contrast  Result Date: 01/13/2023 CLINICAL DATA:  Abdominal pain and diarrhea since Sunday EXAM: CT ABDOMEN AND PELVIS WITH CONTRAST TECHNIQUE: Multidetector CT imaging of the abdomen and pelvis was performed using the standard protocol following bolus administration of intravenous contrast. RADIATION DOSE REDUCTION: This exam was performed according to the departmental dose-optimization program which includes automated exposure control, adjustment of the mA and/or kV according to patient size and/or use of iterative reconstruction technique. CONTRAST:  5m OMNIPAQUE IOHEXOL 300 MG/ML  SOLN COMPARISON:  08/27/2022 FINDINGS: Lower chest: No acute pleural or parenchymal lung disease. The inferior extent of the known descending thoracic aortic aneurysm is identified, measuring up to 4.9 cm in maximal transverse dimension reference image 60/6. This is incompletely assessed on this exam. Hepatobiliary: No focal liver abnormality is seen. No gallstones, gallbladder wall thickening, or biliary  dilatation. Pancreas: Unremarkable. No pancreatic ductal dilatation or surrounding inflammatory changes. Spleen: Stable splenomegaly. Adrenals/Urinary Tract: There is an obstructing 7 mm distal left ureteral calculus reference image 72/2, with severe left-sided obstructive uropathy. There is marked left renal edema, heterogeneous decreased enhancement, and perinephric stranding consistent with superimposed pyelonephritis. Debris is identified within the left renal collecting system. Incidental punctate 2 mm nonobstructing distal left ureteral calculus calculus is seen just proximal to the obstructing 7 mm calculus as well. Punctate 2 mm nonobstructing right renal calculus is unchanged. The right kidney enhances normally. The adrenals and bladder are unremarkable. Stomach/Bowel: No bowel obstruction or ileus. No bowel wall thickening or inflammatory change. Vascular/Lymphatic: Stable aortic atherosclerosis. Subcentimeter left para-aortic lymph nodes are likely reactive. No pathologic adenopathy. Reproductive: Status post hysterectomy. No adnexal masses. Other: Mild left-sided retroperitoneal fat stranding secondary to the high-grade left-sided obstructive uropathy described above. No free intraperitoneal fluid or free gas. No abdominal wall hernia. Musculoskeletal: No acute or destructive bony lesions. Reconstructed images demonstrate no additional findings. IMPRESSION: 1. Obstructing 7 mm distal left ureteral calculus, with high-grade left-sided obstructive uropathy. Marked left renal edema, heterogeneous decreased enhancement, and perinephric fat stranding consistent with superimposed left-sided pyelonephritis. No renal abscess. 2. Punctate nonobstructing right renal calculus. 3. Stable 4.9 cm descending thoracic aortic aneurysm. This is incompletely evaluated on this study due to slice selection. 4.  Aortic Atherosclerosis (ICD10-I70.0). Electronically Signed   By: MRanda NgoM.D.   On: 01/13/2023 14:52     There are no new results to review at this time.  Assessment and Plan: * Obstructive nephropathy CT imaging demonstrating a 7 mm distal left ureteral calculus with severe left-sided obstructive uropathy.  Due to evidence of infection, urology planning to place stent this evening.  - Urology consulted; appreciate their recommendations - Plan for ureteral stent this evening  Acute pyelonephritis CT imaging with evidence of pyelonephritis in the setting of obstructive uropathy.  Given high risk for sepsis, will cover broadly with meropenem for now.  - Meropenem per pharmacy dosing - Urine culture pending - Will narrow antimicrobials based off culture data - IV fluids - Norco and Dilaudid for pain control - Zofran as needed for nausea  AKI (acute kidney injury) ([place] In the setting of obstructive uropathy and profound dehydration.  -  Repeat BMP in the a.m. - Plan for ureteral stent this evening as noted above - IV fluids - Avoid nephrotoxic agents - Monitor urine output  Hypokalemia Secondary to poor p.o. intake over the last several days.  - EKG ordered - Oral and IV potassium supplementation - Repeat BMP in the a.m.  A-fib ([place]) - Hold home Xarelto in the setting of ongoing hematuria with IDA - Continue home metoprolol and amiodarone  Diarrhea Likely functional diarrhea in the setting of acute intra-abdominal infection.  No colitis noted on imaging.  - Imodium as needed  Advance Care Planning:   Code Status: Full Code   Consults: Urology  Family Communication: Patient's husband updated at bedside  Severity of Illness: The appropriate patient status for this patient is INPATIENT. Inpatient status is judged to be reasonable and necessary in order to provide the required intensity of service to ensure the patient's safety. The patient's presenting symptoms, physical exam findings, and initial radiographic and laboratory data in the context of their chronic  comorbidities is felt to place them at high risk for further clinical deterioration. Furthermore, it is not anticipated that the patient will be medically stable for discharge from the hospital within 2 midnights of admission.   * I certify that at the point of admission it is my clinical judgment that the patient will require inpatient hospital care spanning beyond 2 midnights from the point of admission due to high intensity of service, high risk for further deterioration and high frequency of surveillance required.*  Author: Jose Persia, MD 01/13/2023 6:21 PM  For on call review [email or web address].

## 2023-01-13 NOTE — Consult Note (Signed)
Urology Consult  Requesting provider: Sheran Luz, PA-C  Reason for consultation: Obstructing left ureteral calculus with infection  Chief Complaint: Abdominal pain  History of Present Illness: Tara Marquez is a 82 y.o. female who presented to the ED today with a 5-day history of diarrhea and intermittent abdominal pain.  Denies fever, chills.  2-day history of nausea/dry heaves.  Decreased p.o. intake in the last 5 days.  ED evaluation remarkable for elevated creatinine 1.45, hypokalemia 2.8.  No leukocytosis.  Lactate was normal.  UA with >50 WBC/>50 RBC and bacteria  CT abdomen/pelvis with contrast remarkable for a 7 mm left distal ureteral calculus with a 2 mm calculus just proximal.  Moderate-severe left hydronephrosis/hydroureter.  Also noted to have renal edema and perinephric stranding.  Debris was noted in the collecting system.  I saw her August 2023 for microhematuria.  CT urogram showed the calculus to be in the left lower pole and nonobstructing.  Cystoscopy was unremarkable.  Surveillance was elected for the calculus   Past Medical History:  Diagnosis Date   A-fib ([place])    Cancer ([place])    skin ca   Depression    mild major depression   GERD (gastroesophageal reflux disease)    Hyperlipidemia    Hypertension    Hypothyroidism     Past Surgical History:  Procedure Laterality Date   ABDOMINAL HYSTERECTOMY  03/1971   Status Post Hysterectomy   CATARACT EXTRACTION Left    COLONOSCOPY     2010   COLONOSCOPY WITH PROPOFOL N/A 01/12/2022   Procedure: COLONOSCOPY WITH PROPOFOL;  Surgeon: Lucilla Lame, MD;  Location: [facility] ENDOSCOPY;  Service: Endoscopy;  Laterality: N/A;   ESOPHAGOGASTRODUODENOSCOPY N/A 11/02/2022   Procedure: ESOPHAGOGASTRODUODENOSCOPY (EGD);  Surgeon: Lucilla Lame, MD;  Location[facility] ENDOSCOPY;  Service: Endoscopy;  Laterality: N/A;   EYE SURGERY     GIVENS CAPSULE STUDY N/A 12/22/2022   Procedure: GIVENS CAPSULE STUDY;  Surgeon: Lucilla Lame, MD;   Location: [facility] ENDOSCOPY;  Service: Endoscopy;  Laterality: N/A;   TONSILLECTOMY     TONSILLECTOMY AND ADENOIDECTOMY  1951   WISDOM TOOTH EXTRACTION  1970    Home Medications:  Current Meds  Medication Sig   levothyroxine (SYNTHROID) 75 MCG tablet Take 1 tablet (75 mcg total) by mouth daily.   metoprolol succinate (TOPROL-XL) 25 MG 24 hr tablet Take 25 mg by mouth at bedtime.   nitroGLYCERIN (NITROSTAT) 0.4 MG SL tablet Place 1 tablet (0.4 mg total) under the tongue every 5 (five) minutes as needed for chest pain.    Allergies:  Allergies  Allergen Reactions   Sulfa Antibiotics Hives   Amoxicillin Rash   Penicillins Rash    Family History  Problem Relation Age of Onset   Cancer Sister        Has had Breast Cancer and is in remission   Breast cancer Sister 66   Congenital heart disease Son     Social History:  reports that she has never smoked. She has never used smokeless tobacco. She reports that she does not drink alcohol and does not use drugs.  ROS: A complete review of systems was performed.  All systems are negative except for pertinent findings as noted.  Physical Exam:  Vital signs in last 24 hours: Temp:  [98 F (36.7 C)] 98 F (36.7 C) (01/18 1316) Pulse Rate:  [75] 75 (01/18 1316) Resp:  [19] 19 (01/18 1316) BP: (114)/(72) 114/72 (01/18 1316) SpO2:  [95 %] 95 % (01/18 1316)  Weight:  [66.4 kg] 66.4 kg (01/18 1401) Constitutional:  Alert, mild distress HEENT:  AT Cardiovascular: RRR Respiratory: Normal respiratory effort, lungs clear bilaterally GI: Abdomen is soft, mild tenderness GU: No CVA tenderness Skin: No rashes, bruises or suspicious lesions Lymph: No cervical or inguinal adenopathy Neurologic: Grossly intact, no focal deficits, moving all 4 extremities Psychiatric: Normal mood and affect   Laboratory Data:  Recent Labs    01/13/23 1316  WBC 7.7  HGB 9.9*  HCT 31.2*   Recent Labs    01/13/23 1316  NA 131*  K 2.8*  CL 97*  CO2  23  GLUCOSE 142*  BUN 28*  CREATININE 1.45*  CALCIUM 7.9*   No results for input(s): "LABPT", "INR" in the last 72 hours. No results for input(s): "LABURIN" in the last 72 hours. Results for orders placed or performed during the hospital encounter of 01/13/23  Resp panel by RT-PCR (RSV, Flu A&B, Covid) Anterior Nasal Swab     Status: None   Collection Time: 01/13/23  4:40 PM   Specimen: Anterior Nasal Swab  Result Value Ref Range Status   SARS Coronavirus 2 by RT PCR NEGATIVE NEGATIVE Final    Comment: (NOTE) SARS-CoV-2 target nucleic acids are NOT DETECTED.  The SARS-CoV-2 RNA is generally detectable in upper respiratory specimens during the acute phase of infection. The lowest concentration of SARS-CoV-2 viral copies this assay can detect is 138 copies/mL. A negative result does not preclude SARS-Cov-2 infection and should not be used as the sole basis for treatment or other patient management decisions. A negative result may occur with  improper specimen collection/handling, submission of specimen other than nasopharyngeal swab, presence of viral mutation(s) within the areas targeted by this assay, and inadequate number of viral copies(<138 copies/mL). A negative result must be combined with clinical observations, patient history, and epidemiological information. The expected result is Negative.  Fact Sheet for Patients:  EntrepreneurPulse.com.au  Fact Sheet for Healthcare Providers:  IncredibleEmployment.be  This test is no t yet approved or cleared by the [place] FDA and  has been authorized for detection and/or diagnosis of SARS-CoV-2 by FDA under an Emergency Use Authorization (EUA). This EUA will remain  in effect (meaning this test can be used) for the duration of the COVID-19 declaration under Section 564(b)(1) of the Act, 21 U.S.C.section 360bbb-3(b)(1), unless the authorization is terminated  or revoked sooner.        Influenza A by PCR NEGATIVE NEGATIVE Final   Influenza B by PCR NEGATIVE NEGATIVE Final    Comment: (NOTE) The Xpert Xpress SARS-CoV-2/FLU/RSV plus assay is intended as an aid in the diagnosis of influenza from Nasopharyngeal swab specimens and should not be used as a sole basis for treatment. Nasal washings and aspirates are unacceptable for Xpert Xpress SARS-CoV-2/FLU/RSV testing.  Fact Sheet for Patients: EntrepreneurPulse.com.au  Fact Sheet for Healthcare Providers: IncredibleEmployment.be  This test is not yet approved or cleared by the [place] FDA and has been authorized for detection and/or diagnosis of SARS-CoV-2 by FDA under an Emergency Use Authorization (EUA). This EUA will remain in effect (meaning this test can be used) for the duration of the COVID-19 declaration under Section 564(b)(1) of the Act, 21 U.S.C. section 360bbb-3(b)(1), unless the authorization is terminated or revoked.     Resp Syncytial Virus by PCR NEGATIVE NEGATIVE Final    Comment: (NOTE) Fact Sheet for Patients: EntrepreneurPulse.com.au  Fact Sheet for Healthcare Providers: IncredibleEmployment.be  This test is not yet approved or cleared by  the [employer] and has been authorized for detection and/or diagnosis of SARS-CoV-2 by FDA under an Emergency Use Authorization (EUA). This EUA will remain in effect (meaning this test can be used) for the duration of the COVID-19 declaration under Section 564(b)(1) of the Act, 21 U.S.C. section 360bbb-3(b)(1), unless the authorization is terminated or revoked.  Performed at [facility], [address] [place], [place] [address]      Radiologic Imaging: CT images were personally reviewed and interpreted  CT Abdomen Pelvis W Contrast  Result Date: 01/13/2023 CLINICAL DATA:  Abdominal pain and diarrhea since Sunday EXAM: CT ABDOMEN AND PELVIS WITH CONTRAST  TECHNIQUE: Multidetector CT imaging of the abdomen and pelvis was performed using the standard protocol following bolus administration of intravenous contrast. RADIATION DOSE REDUCTION: This exam was performed according to the departmental dose-optimization program which includes automated exposure control, adjustment of the mA and/or kV according to patient size and/or use of iterative reconstruction technique. CONTRAST:  10m OMNIPAQUE IOHEXOL 300 MG/ML  SOLN COMPARISON:  08/27/2022 FINDINGS: Lower chest: No acute pleural or parenchymal lung disease. The inferior extent of the known descending thoracic aortic aneurysm is identified, measuring up to 4.9 cm in maximal transverse dimension reference image 60/6. This is incompletely assessed on this exam. Hepatobiliary: No focal liver abnormality is seen. No gallstones, gallbladder wall thickening, or biliary dilatation. Pancreas: Unremarkable. No pancreatic ductal dilatation or surrounding inflammatory changes. Spleen: Stable splenomegaly. Adrenals/Urinary Tract: There is an obstructing 7 mm distal left ureteral calculus reference image 72/2, with severe left-sided obstructive uropathy. There is marked left renal edema, heterogeneous decreased enhancement, and perinephric stranding consistent with superimposed pyelonephritis. Debris is identified within the left renal collecting system. Incidental punctate 2 mm nonobstructing distal left ureteral calculus calculus is seen just proximal to the obstructing 7 mm calculus as well. Punctate 2 mm nonobstructing right renal calculus is unchanged. The right kidney enhances normally. The adrenals and bladder are unremarkable. Stomach/Bowel: No bowel obstruction or ileus. No bowel wall thickening or inflammatory change. Vascular/Lymphatic: Stable aortic atherosclerosis. Subcentimeter left para-aortic lymph nodes are likely reactive. No pathologic adenopathy. Reproductive: Status post hysterectomy. No adnexal masses. Other:  Mild left-sided retroperitoneal fat stranding secondary to the high-grade left-sided obstructive uropathy described above. No free intraperitoneal fluid or free gas. No abdominal wall hernia. Musculoskeletal: No acute or destructive bony lesions. Reconstructed images demonstrate no additional findings. IMPRESSION: 1. Obstructing 7 mm distal left ureteral calculus, with high-grade left-sided obstructive uropathy. Marked left renal edema, heterogeneous decreased enhancement, and perinephric fat stranding consistent with superimposed left-sided pyelonephritis. No renal abscess. 2. Punctate nonobstructing right renal calculus. 3. Stable 4.9 cm descending thoracic aortic aneurysm. This is incompletely evaluated on this study due to slice selection. 4.  Aortic Atherosclerosis (ICD10-I70.0). Electronically Signed   By: MRanda NgoM.D.   On: 01/13/2023 14:52    Impression/Assessment:  82y.o. female with an obstructing left distal ureteral calculus UA with pyuria No leukocytosis or elevated lactate  Plan:  With her degree of obstruction and leukocytosis she has at high risk for developing sepsis and have recommended cystoscopy with placement of a left ureteral stent this evening.  The procedure was discussed including potential risks of bleeding, sepsis and stent related bladder symptoms/pain It was stressed no attempt will be made to remove her calculus with infection due to the high risk of bacteremia/sepsis and that she will need definitive stone treatment once the infection has been treated We discuss on rare occasion stent placement is  unsuccessful due to an impacted stone and would potentially need percutaneous nephrostomy if this were to occur All questions were answered and she desires to proceed   01/13/2023, 5:25 PM  John Giovanni,  MD

## 2023-01-13 NOTE — Assessment & Plan Note (Signed)
-  Hold home Xarelto in the setting of ongoing hematuria with IDA - Continue home metoprolol and amiodarone

## 2023-01-13 NOTE — Assessment & Plan Note (Signed)
CT imaging demonstrating a 7 mm distal left ureteral calculus with severe left-sided obstructive uropathy.  Due to evidence of infection, urology planning to place stent this evening.  - Urology consulted; appreciate their recommendations - Plan for ureteral stent this evening

## 2023-01-13 NOTE — Op Note (Signed)
Preoperative diagnosis:  Left distal ureteral calculus Pyuria  Postoperative diagnosis:  Same  Procedure:  Cystoscopy Left ureteral stent placement (41F/26 cm)  Left retrograde pyelography with interpretation   Surgeon: Nicki Reaper C. Jeniffer Culliver, M.D.  Anesthesia: MAC  Complications: None  Intraoperative findings:  Initial fluoroscopy showed contrast remaining in the right collecting system without dilation.  A faint nephrogram was present on the left and no contrast seen in the left collecting system.  The left distal calculus was visualized Bladder mucosa without solid or papillary lesions.  Scattered dark blood clots noted in the bladder.  No efflux seen from the left UO Left retrograde pyelogram remarkable for S-curve of the proximal ureter.  Moderate hydronephrosis with a mottled appearance of the calyces consistent with clot.  EBL: Minimal  Specimens: Urine left renal pelvis for culture  Indication: Tara Marquez is a 82 y.o. female presented to the ED today with malaise, nausea and lower abdominal pain.  The malaise and nausea has persisted.  Abdominal pain resolved after the first day.  CT showed a 7 mm left distal ureteral calculus with moderate-severe hydronephrosis/hydroureter.  Lactate and WBC count normal.  UA with >50 RBC/>50 WBC.  After reviewing the management options for treatment, she elected to proceed with the above surgical procedure(s). We have discussed the potential benefits and risks of the procedure, side effects of the proposed treatment, the likelihood of the patient achieving the goals of the procedure, and any potential problems that might occur during the procedure or recuperation. Informed consent has been obtained.  Description of procedure:  The patient was taken to the operating room and IVs was obtained by anesthesia.  The patient was placed in the dorsal lithotomy position, prepped and draped in the usual sterile fashion, and preoperative antibiotics were  administered. A preoperative time-out was performed.   A lidocaine Uro-Jet was instilled per urethra.  A 21 French cystoscope was lubricated and passed per urethra.  Panendoscopy was performed with findings as described above.  A 0.038 Sensor guidewire was then advanced up the left ureter however the wire would not negotiate the S-curve.  A 5 French open-ended ureteral catheter was placed over the sensor wire to a point just distal to the curve.  A 0.038 Zip wire was placed to the ureteral catheter and able to negotiate the curve and be advanced into the renal pelvis.  The ureter straightened once the wire was advanced into the kidney.  The open-ended catheter was then advanced over the Glidewire to the renal pelvis.  The wire was removed and 10 cc of dark bloody urine was aspirated and sent for culture.  Contrast was then instilled through the catheter with findings as described above.  The Sensor wire was then placed into the ureteral catheter followed by catheter removal.  A 41F/24 cm Contour ureteral stent was then advanced over the wire however the proximal tip was just past the UPJ and a good curl was not obtained.  The cystoscope was repassed and the stent was brought out through the urethral meatus.  The guidewire was replaced and the stent and to an upper pole calyx.  A 41F/26 cm Contour ureteral stent was then placed under fluoroscopic guidance with the proximal curl noted in an upper pole calyx.  The distal end of the stent was well-positioned.  The cystoscope was repassed and the bladder was emptied.  The patient appeared to tolerate the procedure well and without complications.  She was transferred to the PACU in stable  condition.  Recommendation: Continue broad-spectrum antibiotics Will need definitive stone treatment ~ 2 weeks   John Giovanni, MD

## 2023-01-13 NOTE — Transfer of Care (Signed)
Immediate Anesthesia Transfer of Care Note  Patient: Tara Marquez  Procedure(s) Performed: CYSTOSCOPY WITH RETROGRADE PYELOGRAM, URETEROSCOPY AND STENT PLACEMENT (Left)  Patient Location: PACU  Anesthesia Type:General  Level of Consciousness: drowsy  Airway & Oxygen Therapy: Patient Spontanous Breathing and Patient connected to face mask oxygen  Post-op Assessment: Report given to RN, Post -op Vital signs reviewed and stable, and Patient moving all extremities  Post vital signs: Reviewed and stable  Last Vitals:  Vitals Value Taken Time  BP 110/63 01/13/23 1935  Temp 36.2 C 01/13/23 1928  Pulse 93 01/13/23 1935  Resp 19 01/13/23 1936  SpO2 100 % 01/13/23 1935  Vitals shown include unvalidated device data.  Last Pain:  Vitals:   01/13/23 1806  TempSrc:   PainSc: 2          Complications: No notable events documented.

## 2023-01-13 NOTE — Assessment & Plan Note (Signed)
In the setting of obstructive uropathy and profound dehydration.  - Repeat BMP in the a.m. - Plan for ureteral stent this evening as noted above - IV fluids - Avoid nephrotoxic agents - Monitor urine output

## 2023-01-13 NOTE — ED Triage Notes (Signed)
Pt comes via EMs from home with c/o diarrhea since Sunday. Pt states some belly pain on Sunday but none now. Pt was given some fluids. IV in place.

## 2023-01-13 NOTE — Assessment & Plan Note (Signed)
Secondary to poor p.o. intake over the last several days.  - EKG ordered - Oral and IV potassium supplementation - Repeat BMP in the a.m.

## 2023-01-13 NOTE — ED Provider Notes (Signed)
[facility] Provider Note    Event Date/Time   First MD Initiated Contact with Patient 01/13/23 1345     (approximate)   History   Diarrhea   HPI  Tara Marquez is a 82 y.o. female who presents today for evaluation of diarrhea that has been ongoing since Sunday.  She reports that she has had several episodes daily, most recently 3 since being in the emergency department.  Patient reports that she has had dry heaving but no vomiting over the last couple of days.  She denies any blood in her stool.  She reports that she had abdominal pain at the time of symptom onset but this has since resolved.  She has not had any fevers, cough, runny nose.  She denies any urinary symptoms.  Patient Active Problem List   Diagnosis Date Noted   Thoracic aortic aneurysm without rupture ([place]) 11/09/2022   Hematuria 11/05/2022   Polyp of esophagus 11/02/2022   Iron deficiency anemia 07/28/2022   Positive colorectal cancer screening using Cologuard test    Abnormal ECG 03/31/2021   Acute non-recurrent sinusitis 11/30/2020   Acute otitis media 11/30/2020   Benign essential HTN 10/31/2018   Frequent PVCs 09/08/2017   A-fib ([place]) 08/27/2015   Clinical depression 08/27/2015   Blood pressure elevated 08/27/2015   Polypharmacy 08/27/2015   Esophagitis, reflux 08/27/2015   Fatigue 08/27/2015   Acid reflux 08/27/2015   HLD (hyperlipidemia) 08/27/2015   Adult hypothyroidism 08/27/2015   Adaptive colitis 08/27/2015   Malaise and fatigue 08/27/2015   Mild major depression ([place]) 08/27/2015   Polymyalgia rheumatica ([place]) 08/27/2015   Cranial arteritis ([place]) 08/27/2015   Avitaminosis D 08/27/2015          Physical Exam   Triage Vital Signs: ED Triage Vitals  Enc Vitals Group     BP 01/13/23 1316 114/72     Pulse Rate 01/13/23 1316 75     Resp 01/13/23 1316 19     Temp 01/13/23 1316 98 F (36.7 C)     Temp src --      SpO2 01/13/23 1316 95 %     Weight --       Height --      Head Circumference --      Peak Flow --      Pain Score 01/13/23 1315 0     Pain Loc --      Pain Edu? --      Excl. in [place]? --     Most recent vital signs: Vitals:   01/13/23 1316  BP: 114/72  Pulse: 75  Resp: 19  Temp: 98 F (36.7 C)  SpO2: 95%    Physical Exam Vitals and nursing note reviewed.  Constitutional:      General: Awake and alert. No acute distress.    Appearance: Normal appearance. The patient is normal weight.  HENT:     Head: Normocephalic and atraumatic.     Mouth: Mucous membranes are moist.  Eyes:     General: PERRL. Normal EOMs        Right eye: No discharge.        Left eye: No discharge.     Conjunctiva/sclera: Conjunctivae normal.  Cardiovascular:     Rate and Rhythm: Normal rate and regular rhythm.     Pulses: Normal pulses.  Pulmonary:     Effort: Pulmonary effort is normal. No respiratory distress.     Breath sounds: Normal breath sounds.  Abdominal:  Abdomen is soft. There is mild diffuse abdominal tenderness. No rebound or guarding. No distention. Musculoskeletal:        General: No swelling. Normal range of motion.     Cervical back: Normal range of motion and neck supple.  Skin:    General: Skin is warm and dry.     Capillary Refill: Capillary refill takes less than 2 seconds.     Findings: No rash.  Neurological:     Mental Status: The patient is awake and alert.      ED Results / Procedures / Treatments   Labs (all labs ordered are listed, but only abnormal results are displayed) Labs Reviewed  COMPREHENSIVE METABOLIC PANEL - Abnormal; Notable for the following components:      Result Value   Sodium 131 (*)    Potassium 2.8 (*)    Chloride 97 (*)    Glucose, Bld 142 (*)    BUN 28 (*)    Creatinine, Ser 1.45 (*)    Calcium 7.9 (*)    Total Protein 6.0 (*)    Albumin 2.7 (*)    AST 47 (*)    Total Bilirubin 1.4 (*)    GFR, Estimated 36 (*)    All other components within normal limits  CBC -  Abnormal; Notable for the following components:   RBC 3.81 (*)    Hemoglobin 9.9 (*)    HCT 31.2 (*)    Platelets 126 (*)    All other components within normal limits  GASTROINTESTINAL PANEL BY PCR, STOOL (REPLACES STOOL CULTURE)  C DIFFICILE QUICK SCREEN W PCR REFLEX    RESP PANEL BY RT-PCR (RSV, FLU A&B, COVID)  RVPGX2  LIPASE, BLOOD  URINALYSIS, ROUTINE W REFLEX MICROSCOPIC     EKG     RADIOLOGY I independently reviewed and interpreted imaging and agree with radiologists findings.     PROCEDURES:  Critical Care performed:   Procedures   MEDICATIONS ORDERED IN ED: Medications  potassium chloride 10 mEq in 100 mL IVPB (10 mEq Intravenous New Bag/Given 01/13/23 1454)  potassium chloride SA (KLOR-CON M) CR tablet 20 mEq (has no administration in time range)  cefTRIAXone (ROCEPHIN) 1 g in sodium chloride 0.9 % 100 mL IVPB (has no administration in time range)  sodium chloride 0.9 % bolus 500 mL (500 mLs Intravenous New Bag/Given 01/13/23 1455)  iohexol (OMNIPAQUE) 300 MG/ML solution 75 mL (75 mLs Intravenous Contrast Given 01/13/23 1432)     IMPRESSION / MDM / ASSESSMENT AND PLAN / ED COURSE  I reviewed the triage vital signs and the nursing notes.   Differential diagnosis includes, but is not limited to, gastroenteritis, C. difficile, diverticulitis, COVID, influenza.  Patient is awake and alert, hemodynamically stable and afebrile.  She was placed on the cardiac monitor.  No leukocytosis, stable H&H.  However she does have electrolyte disarray with a potassium of 2.8.  She also has a BUN to creatinine ratio suggestive of prerenal volume depletion likely in the setting of dehydration.  She got 1 L of fluids with EMS, an additional 500cc in the ER. I reviewed her chart and her most recent echo was in 2019 and showed an EF of 50%.  She declined any analgesia or antiemetic.  Her potassium was repleted with 10 IV potassium.  Given that she was mildly tender on exam, she agreed  to CT scan.  CT reveals obstructing 7 mm distal left ureteral calculus with high-grade left-sided obstructive uropathy with marked left renal edema  and perinephric fat stranding consistent with superimposed left-sided pyelonephritis with debris in the left renal collecting system.  No abscess.  I discussed these findings with Dr. Bernardo Heater with urology who recommends admission for stenting within 24 hours.  If urinalysis is infected or if she becomes septic she will require stenting more urgently.  He has requested admission to the hospitalist service.  Patient and family ember understand and agree with plan.  Antibiotics were initiated.  Patient was discussed with Dr. Corky Downs who agrees with assessment and plan.  Patient's presentation is most consistent with acute complicated illness / injury requiring diagnostic workup.     FINAL CLINICAL IMPRESSION(S) / ED DIAGNOSES   Final diagnoses:  Nausea vomiting and diarrhea  Hypokalemia  Dehydration  Obstructive uropathy  Pyelonephritis  Kidney stone     Rx / DC Orders   ED Discharge Orders     None        Note:  This document was prepared using Dragon voice recognition software and may include unintentional dictation errors.   Emeline Gins 01/13/23 1529    Rada Hay, MD 01/13/23 1620    Rada Hay, MD 01/14/23 1659

## 2023-01-13 NOTE — Consult Note (Signed)
Pharmacy Antibiotic Note  Tara Marquez is a 82 y.o. female admitted on 01/13/2023 with  diarrhea . Patient subsequently found to have obstructing left ureteral calculus with left sided pyelonephritis. Pharmacy has been consulted for meropenem dosing.  Plan:  Meropenem 1 g IV q12h  Height: '5\' 5"'$  (165.1 cm) Weight: 66.4 kg (146 lb 6.2 oz) IBW/kg (Calculated) : 57  Temp (24hrs), Avg:98 F (36.7 C), Min:98 F (36.7 C), Max:98 F (36.7 C)  Recent Labs  Lab 01/13/23 1316  WBC 7.7  CREATININE 1.45*    Estimated Creatinine Clearance: 27.8 mL/min (A) (by C-G formula based on SCr of 1.45 mg/dL (H)).    Allergies  Allergen Reactions   Sulfa Antibiotics Hives   Amoxicillin Rash   Penicillins Rash    Antimicrobials this admission: Meropenem 1/18 >>   Dose adjustments this admission: N/A  Microbiology results: 1/18 GI panel: pending 1/18 C diff screen: pending 1/18 COVID / Flu A&B / RSV: pending  Thank you for allowing pharmacy to be a part of this patient's care.  Benita Gutter 01/13/2023 4:21 PM

## 2023-01-13 NOTE — Anesthesia Preprocedure Evaluation (Signed)
Anesthesia Evaluation  Patient identified by MRN, date of birth, ID band Patient awake    Reviewed: Allergy & Precautions, H&P , NPO status , Patient's Chart, lab work & pertinent test results, reviewed documented beta blocker date and time   Airway Mallampati: III  TM Distance: >3 FB Neck ROM: full    Dental  (+) Teeth Intact   Pulmonary neg pulmonary ROS   Pulmonary exam normal        Cardiovascular Exercise Tolerance: Poor hypertension, On Medications negative cardio ROS Atrial Fibrillation  Rhythm:regular Rate:Normal     Neuro/Psych  PSYCHIATRIC DISORDERS  Depression    negative neurological ROS     GI/Hepatic Neg liver ROS,GERD  Medicated,,  Endo/Other  Hypothyroidism    Renal/GU negative Renal ROS  negative genitourinary   Musculoskeletal   Abdominal   Peds  Hematology  (+) Blood dyscrasia, anemia   Anesthesia Other Findings Past Medical History: No date: A-fib ([facility]) No date: Cancer ([place])     Comment:  skin ca No date: Depression     Comment:  mild major depression No date: GERD (gastroesophageal reflux disease) No date: Hyperlipidemia No date: Hypertension No date: Hypothyroidism Past Surgical History: 03/1971: ABDOMINAL HYSTERECTOMY     Comment:  Status Post Hysterectomy No date: CATARACT EXTRACTION; Left No date: COLONOSCOPY     Comment:  2010 01/12/2022: COLONOSCOPY WITH PROPOFOL; N/A     Comment:  Procedure: COLONOSCOPY WITH PROPOFOL;  Surgeon: Lucilla Lame, MD;  Location: [facility] ENDOSCOPY;  Service:               Endoscopy;  Laterality: N/A; 11/02/2022: ESOPHAGOGASTRODUODENOSCOPY; N/A     Comment:  Procedure: ESOPHAGOGASTRODUODENOSCOPY (EGD);  Surgeon:               Lucilla Lame, MD;  Location: [facility] ENDOSCOPY;  Service:               Endoscopy;  Laterality: N/A; No date: EYE SURGERY 12/22/2022: GIVENS CAPSULE STUDY; N/A     Comment:  Procedure: GIVENS CAPSULE STUDY;  Surgeon:  Lucilla Lame,              MD;  Location: [facility] ENDOSCOPY;  Service: Endoscopy;                Laterality: N/A; No date: TONSILLECTOMY 1951: TONSILLECTOMY AND ADENOIDECTOMY 1970: WISDOM TOOTH EXTRACTION BMI    Body Mass Index: 24.36 kg/m     Reproductive/Obstetrics negative OB ROS                             Anesthesia Physical Anesthesia Plan  ASA: 3 and emergent  Anesthesia Plan: General ETT   Post-op Pain Management:    Induction:   PONV Risk Score and Plan:   Airway Management Planned:   Additional Equipment:   Intra-op Plan:   Post-operative Plan:   Informed Consent: I have reviewed the patients History and Physical, chart, labs and discussed the procedure including the risks, benefits and alternatives for the proposed anesthesia with the patient or authorized representative who has indicated his/her understanding and acceptance.     Dental Advisory Given  Plan Discussed with: CRNA  Anesthesia Plan Comments:        Anesthesia Quick Evaluation

## 2023-01-13 NOTE — Assessment & Plan Note (Signed)
Likely functional diarrhea in the setting of acute intra-abdominal infection.  No colitis noted on imaging.  - Imodium as needed

## 2023-01-14 ENCOUNTER — Other Ambulatory Visit: Payer: Self-pay | Admitting: Physician Assistant

## 2023-01-14 ENCOUNTER — Encounter: Payer: Self-pay | Admitting: Urology

## 2023-01-14 DIAGNOSIS — N23 Unspecified renal colic: Secondary | ICD-10-CM | POA: Diagnosis not present

## 2023-01-14 DIAGNOSIS — N210 Calculus in bladder: Secondary | ICD-10-CM

## 2023-01-14 DIAGNOSIS — N201 Calculus of ureter: Secondary | ICD-10-CM

## 2023-01-14 DIAGNOSIS — N138 Other obstructive and reflux uropathy: Secondary | ICD-10-CM | POA: Diagnosis not present

## 2023-01-14 DIAGNOSIS — R8281 Pyuria: Secondary | ICD-10-CM | POA: Diagnosis not present

## 2023-01-14 LAB — BASIC METABOLIC PANEL
Anion gap: 8 (ref 5–15)
Anion gap: 8 (ref 5–15)
BUN: 27 mg/dL — ABNORMAL HIGH (ref 8–23)
BUN: 28 mg/dL — ABNORMAL HIGH (ref 8–23)
CO2: 22 mmol/L (ref 22–32)
CO2: 23 mmol/L (ref 22–32)
Calcium: 7.5 mg/dL — ABNORMAL LOW (ref 8.9–10.3)
Calcium: 7.9 mg/dL — ABNORMAL LOW (ref 8.9–10.3)
Chloride: 101 mmol/L (ref 98–111)
Chloride: 99 mmol/L (ref 98–111)
Creatinine, Ser: 1.4 mg/dL — ABNORMAL HIGH (ref 0.44–1.00)
Creatinine, Ser: 1.49 mg/dL — ABNORMAL HIGH (ref 0.44–1.00)
GFR, Estimated: 38 mL/min — ABNORMAL LOW (ref >60)
GFR, Estimated: 35 mL/min — ABNORMAL LOW (ref >60)
Glucose, Bld: 101 mg/dL — ABNORMAL HIGH (ref 70–99)
Glucose, Bld: 117 mg/dL — ABNORMAL HIGH (ref 70–99)
Potassium: 3.1 mmol/L — ABNORMAL LOW (ref 3.5–5.1)
Potassium: 3.5 mmol/L (ref 3.5–5.1)
Sodium: 131 mmol/L — ABNORMAL LOW (ref 135–145)
Sodium: 130 mmol/L — ABNORMAL LOW (ref 135–145)

## 2023-01-14 LAB — CBC
HCT: 24.1 % — ABNORMAL LOW (ref 36.0–46.0)
Hemoglobin: 7.8 g/dL — ABNORMAL LOW (ref 12.0–15.0)
MCH: 26.6 pg (ref 26.0–34.0)
MCHC: 32.4 g/dL (ref 30.0–36.0)
MCV: 82.3 fL (ref 80.0–100.0)
Platelets: 90 10*3/uL — ABNORMAL LOW (ref 150–400)
RBC: 2.93 MIL/uL — ABNORMAL LOW (ref 3.87–5.11)
RDW: 15.2 % (ref 11.5–15.5)
WBC: 5.3 10*3/uL (ref 4.0–10.5)
nRBC: 0 % (ref 0.0–0.2)

## 2023-01-14 LAB — MAGNESIUM: Magnesium: 1.8 mg/dL (ref 1.7–2.4)

## 2023-01-14 LAB — VITAMIN D 25 HYDROXY (VIT D DEFICIENCY, FRACTURES): Vit D, 25-Hydroxy: 15.21 ng/mL — ABNORMAL LOW (ref 30–100)

## 2023-01-14 LAB — PHOSPHORUS: Phosphorus: 2.3 mg/dL — ABNORMAL LOW (ref 2.5–4.6)

## 2023-01-14 LAB — FOLATE: Folate: 11.2 ng/mL (ref >5.9)

## 2023-01-14 LAB — OSMOLALITY: Osmolality: 281 mOsm/kg (ref 275–295)

## 2023-01-14 LAB — VITAMIN B12: Vitamin B-12: 62 pg/mL — ABNORMAL LOW (ref 180–914)

## 2023-01-14 MED ORDER — POTASSIUM CHLORIDE 10 MEQ/100ML IV SOLN
10.0000 meq | INTRAVENOUS | Status: AC
  Administered 2023-01-14 (×4): 10 meq via INTRAVENOUS
  Filled 2023-01-14: qty 100

## 2023-01-14 MED ORDER — RIVAROXABAN 15 MG PO TABS
15.0000 mg | ORAL_TABLET | Freq: Every day | ORAL | Status: DC
  Administered 2023-01-14 – 2023-01-20 (×7): 15 mg via ORAL
  Filled 2023-01-14 (×7): qty 1

## 2023-01-14 NOTE — Consult Note (Signed)
Pharmacy Antibiotic Note  Tara Marquez is a 82 y.o. female admitted on 01/13/2023 with  diarrhea . Patient subsequently found to have obstructing left ureteral calculus with left sided pyelonephritis. Pharmacy has been consulted for meropenem dosing.  -s/p ureteral stent 1/18,  f/u Ucx  Plan:  Meropenem 1 g IV q12h   for Crcl 28.8 ml/min   Height: '5\' 5"'$  (165.1 cm) Weight: 66.4 kg (146 lb 6.2 oz) IBW/kg (Calculated) : 57  Temp (24hrs), Avg:97.8 F (36.6 C), Min:97.1 F (36.2 C), Max:98.4 F (36.9 C)  Recent Labs  Lab 01/13/23 1316 01/13/23 1535 01/14/23 0327  WBC 7.7  --  5.3  CREATININE 1.45*  --  1.40*  LATICACIDVEN  --  1.4  --      Estimated Creatinine Clearance: 28.8 mL/min (A) (by C-G formula based on SCr of 1.4 mg/dL (H)).    Allergies  Allergen Reactions   Sulfa Antibiotics Hives   Amoxicillin Rash   Penicillins Rash    Antimicrobials this admission: Meropenem 1/18 >>   Dose adjustments this admission: N/A  Microbiology results: 1/18 GI panel: pending 1/18 C diff screen: pending 1/18 COVID / Flu A&B / RSV: nag 1/8 Ucx: pend  Thank you for allowing pharmacy to be a part of this patient's care.  Tara Marquez A 01/14/2023 8:03 AM

## 2023-01-14 NOTE — Progress Notes (Signed)
Urology Inpatient Progress Note  Subjective: No acute events overnight. She is afebrile, BP has been a bit labile. WBC count down today, 5.3. Creatinine stable, 1.40. Urine culture pending, on antibiotics as below. She reports indigestion, nausea, and left hip/thigh pain this morning. She denies flank pain. She also reports dysuria and frequency. She is voiding red urine.  Anti-infectives: Anti-infectives (From admission, onward)    Start     Dose/Rate Route Frequency Ordered Stop   01/13/23 1800  meropenem (MERREM) 1 g in sodium chloride 0.9 % 100 mL IVPB        1 g 200 mL/hr over 30 Minutes Intravenous Every 12 hours 01/13/23 1620     01/13/23 1515  cefTRIAXone (ROCEPHIN) 1 g in sodium chloride 0.9 % 100 mL IVPB  Status:  Discontinued        1 g 200 mL/hr over 30 Minutes Intravenous Every 24 hours 01/13/23 1501 01/13/23 1604       Current Facility-Administered Medications  Medication Dose Route Frequency Provider Last Rate Last Admin   acetaminophen (TYLENOL) tablet 650 mg  650 mg Oral Q6H PRN Jose Persia, MD       Or   acetaminophen (TYLENOL) suppository 650 mg  650 mg Rectal Q6H PRN Jose Persia, MD       amiodarone (PACERONE) tablet 200 mg  200 mg Oral Daily Jose Persia, MD   200 mg at 01/14/23 0819   atorvastatin (LIPITOR) tablet 40 mg  40 mg Oral q1800 Jose Persia, MD       HYDROcodone-acetaminophen (NORCO/VICODIN) 5-325 MG per tablet 1 tablet  1 tablet Oral Q6H PRN Jose Persia, MD   1 tablet at 01/14/23 0819   HYDROmorphone (DILAUDID) injection 0.5 mg  0.5 mg Intravenous Q4H PRN Jose Persia, MD       lactated ringers bolus 1,000 mL  1,000 mL Intravenous Once Jose Persia, MD       levothyroxine (SYNTHROID) tablet 75 mcg  75 mcg Oral Q0600 Jose Persia, MD   75 mcg at 01/14/23 0503   loperamide (IMODIUM) capsule 2 mg  2 mg Oral PRN Jose Persia, MD       meropenem (MERREM) 1 g in sodium chloride 0.9 % 100 mL IVPB  1 g Intravenous Q12H Lockie Mola B, RPH 200 mL/hr at 01/14/23 0829 1 g at 01/14/23 0829   metoprolol succinate (TOPROL-XL) 24 hr tablet 25 mg  25 mg Oral QHS Jose Persia, MD   25 mg at 01/13/23 2139   ondansetron (ZOFRAN) tablet 4 mg  4 mg Oral Q6H PRN Jose Persia, MD       Or   ondansetron (ZOFRAN) injection 4 mg  4 mg Intravenous Q6H PRN Jose Persia, MD       polyethylene glycol (MIRALAX / GLYCOLAX) packet 17 g  17 g Oral Daily PRN Jose Persia, MD       potassium chloride 10 mEq in 100 mL IVPB  10 mEq Intravenous Q1 Hr x 4 Val Riles, MD       sertraline (ZOLOFT) tablet 150 mg  150 mg Oral Daily Jose Persia, MD   150 mg at 01/14/23 0819   Objective: Vital signs in last 24 hours: Temp:  [97.1 F (36.2 C)-98.8 F (37.1 C)] 98.8 F (37.1 C) (01/19 0818) Pulse Rate:  [75-106] 99 (01/19 0818) Resp:  [16-24] 18 (01/19 0818) BP: (58-179)/(37-139) 97/62 (01/19 0818) SpO2:  [90 %-100 %] 94 % (01/19 0818) Weight:  [66.4 kg] 66.4 kg (01/18 1401)  Intake/Output from previous day: 01/18 0701 - 01/19 0700 In: 6948 [P.O.:240; I.V.:1000; IV Piggyback:100] Out: 600 [Urine:600] Intake/Output this shift: No intake/output data recorded.  Physical Exam Vitals and nursing note reviewed.  Constitutional:      General: She is not in acute distress.    Appearance: She is not ill-appearing, toxic-appearing or diaphoretic.  HENT:     Head: Normocephalic and atraumatic.  Pulmonary:     Effort: Pulmonary effort is normal. No respiratory distress.  Skin:    General: Skin is warm and dry.  Neurological:     Mental Status: She is alert and oriented to person, place, and time.  Psychiatric:        Mood and Affect: Mood normal.        Behavior: Behavior normal.    Lab Results:  Recent Labs    01/13/23 1316 01/14/23 0327  WBC 7.7 5.3  HGB 9.9* 7.8*  HCT 31.2* 24.1*  PLT 126* 90*   BMET Recent Labs    01/13/23 1316 01/14/23 0327  NA 131* 131*  K 2.8* 3.1*  CL 97* 101  CO2 23 22  GLUCOSE  142* 101*  BUN 28* 27*  CREATININE 1.45* 1.40*  CALCIUM 7.9* 7.5*   Studies/Results: DG OR UROLOGY CYSTO IMAGE ([facility] ONLY)  Result Date: 01/13/2023 There is no interpretation for this exam.  This order is for images obtained during a surgical procedure.  Please See "Surgeries" Tab for more information regarding the procedure.   CT Abdomen Pelvis W Contrast  Result Date: 01/13/2023 CLINICAL DATA:  Abdominal pain and diarrhea since Sunday EXAM: CT ABDOMEN AND PELVIS WITH CONTRAST TECHNIQUE: Multidetector CT imaging of the abdomen and pelvis was performed using the standard protocol following bolus administration of intravenous contrast. RADIATION DOSE REDUCTION: This exam was performed according to the departmental dose-optimization program which includes automated exposure control, adjustment of the mA and/or kV according to patient size and/or use of iterative reconstruction technique. CONTRAST:  35m OMNIPAQUE IOHEXOL 300 MG/ML  SOLN COMPARISON:  08/27/2022 FINDINGS: Lower chest: No acute pleural or parenchymal lung disease. The inferior extent of the known descending thoracic aortic aneurysm is identified, measuring up to 4.9 cm in maximal transverse dimension reference image 60/6. This is incompletely assessed on this exam. Hepatobiliary: No focal liver abnormality is seen. No gallstones, gallbladder wall thickening, or biliary dilatation. Pancreas: Unremarkable. No pancreatic ductal dilatation or surrounding inflammatory changes. Spleen: Stable splenomegaly. Adrenals/Urinary Tract: There is an obstructing 7 mm distal left ureteral calculus reference image 72/2, with severe left-sided obstructive uropathy. There is marked left renal edema, heterogeneous decreased enhancement, and perinephric stranding consistent with superimposed pyelonephritis. Debris is identified within the left renal collecting system. Incidental punctate 2 mm nonobstructing distal left ureteral calculus calculus is seen just  proximal to the obstructing 7 mm calculus as well. Punctate 2 mm nonobstructing right renal calculus is unchanged. The right kidney enhances normally. The adrenals and bladder are unremarkable. Stomach/Bowel: No bowel obstruction or ileus. No bowel wall thickening or inflammatory change. Vascular/Lymphatic: Stable aortic atherosclerosis. Subcentimeter left para-aortic lymph nodes are likely reactive. No pathologic adenopathy. Reproductive: Status post hysterectomy. No adnexal masses. Other: Mild left-sided retroperitoneal fat stranding secondary to the high-grade left-sided obstructive uropathy described above. No free intraperitoneal fluid or free gas. No abdominal wall hernia. Musculoskeletal: No acute or destructive bony lesions. Reconstructed images demonstrate no additional findings. IMPRESSION: 1. Obstructing 7 mm distal left ureteral calculus, with high-grade left-sided obstructive uropathy. Marked left renal edema, heterogeneous decreased enhancement,  and perinephric fat stranding consistent with superimposed left-sided pyelonephritis. No renal abscess. 2. Punctate nonobstructing right renal calculus. 3. Stable 4.9 cm descending thoracic aortic aneurysm. This is incompletely evaluated on this study due to slice selection. 4.  Aortic Atherosclerosis (ICD10-I70.0). Electronically Signed   By: Randa Ngo M.D.   On: 01/13/2023 14:52    Assessment & Plan: 82 year old female admitted with left renal colic and pyuria associated with 7 mm and 2 mm distal left ureteral stones, now s/p left ureteral stent placement with Dr. Bernardo Heater.  She is clinically improving today on empiric antibiotics.  She is having some mild stent symptoms; I am hesitant to start Flomax because she is rather frail appearing today.  We discussed that she will require definitive stone management in an outpatient ureteroscopy with laser lithotripsy and stent exchange in 2 to 3 weeks.  She expressed understanding.  Our surgical scheduler  will call her to arrange.  Will defer to primary team for further evaluation of her nausea, indigestion, and left hip/leg pain.  Recommendations: -Oxybutynin 5 mg every 8 as needed for stent discomfort -Agree with empiric antibiotics and following cultures.  She will require a total of 14 days of culture appropriate therapy. -Outpatient left URS/LL/stent exchange in 2 to 3 weeks, we will call to arrange  Debroah Loop, PA-C 01/14/2023

## 2023-01-14 NOTE — Progress Notes (Signed)
Surgical Physician Order Form [facility] [place]  Dr. Bernardo Heater * Scheduling expectation :  2-3 weeks  *Length of Case:   *Clearance needed: yes, Dr. Albertine Patricia (cardio)  *Anticoagulation Instructions: Hold all anticoagulants  *Aspirin Instructions: N/A  *Post-op visit Date/Instructions:   TBD  *Diagnosis: Left Ureteral Stone  *Procedure: left  Ureteroscopy w/laser lithotripsy & stent exchange ([id number])   Additional orders: N/A  -Admit type: OUTpatient  -Anesthesia: General  -VTE Prophylaxis Standing Order SCD's       Other:   -Standing Lab Orders Per Anesthesia    Lab other: None  -Standing Test orders EKG/Chest x-ray per Anesthesia       Test other:   - Medications:   TBD per UCx  -Other orders:  N/A

## 2023-01-14 NOTE — Progress Notes (Signed)
Patient is alert and oriented x4. Husband and son at bedside (numbers written on whiteboard). Stated that she was in pain and received norco x1. Had 2 more doses of IV potassium to get which was administered by floor nurse. Bp runs on the lower side diastolic. She asked for SCDs tobe removed due to discomfort. No skin issues other than bruising. Patient did report incontinence. Had a urinary accident in bed upon arrival to unit and entire bed was changed and pericare performed. Did assess a small amount of brown blood coming from vagina that was from procedure. Placed purewick due to incontinence. Patient denied additional needs. Sleeping for most of the night.

## 2023-01-14 NOTE — Progress Notes (Signed)
[facility] Progress Note  Patient: Tara Marquez    EYC:[id number]  DOA: 01/13/2023     Date of Service: the patient was seen and examined on 01/14/2023  Chief Complaint  Patient presents with   Diarrhea   Brief hospital course: Tara Marquez is a 82 y.o. female with medical history significant of atrial fibrillation, iron deficiency anemia, chronic hematuria of unknown etiology, hypertension, hyperlipidemia, who presents to the ED due to diarrhea.  For past 5 days patient is feeling generalized weakness, nausea vomiting and lack of appetite.  2 days ago she developed diarrhea, having intermittent abdominal pain.  Decreased oral intake. ED workup.  Hypokalemia potassium 2.8, hyponatremia sodium 131, AKI creatinine 1.45, anemia Hb 9.9, UA positive for UTI, CT A/P:1. Obstructing 7 mm distal left ureteral calculus, with high-grade left-sided obstructive uropathy. Marked left renal edema, heterogeneous decreased enhancement, and perinephric fat stranding consistent with superimposed left-sided pyelonephritis. No renal abscess. 2. Punctate nonobstructing right renal calculus.  3. Stable 4.9 cm descending thoracic aortic aneurysm. This is incompletely evaluated on this study due to slice selection. 4.  Aortic Atherosclerosis (ICD10-I70.0).   Urology was consulted and patient was admitted for further management as below.  Assessment and Plan:  # Obstructive nephropathy CT imaging demonstrating a 7 mm distal left ureteral calculus with severe left-sided obstructive uropathy.  Due to evidence of infection, urology was consulted, s/p left ureteral stent placement with Dr. Bernardo Heater. Oxybutynin 5 mg every 8 as needed for stent discomfort. empiric antibiotics and following cultures.   She will require a total of 14 days of culture appropriate therapy. Outpatient left URS/LL/stent exchange in 2 to 3 weeks, we will call to arrange   Acute pyelonephritis CT imaging with evidence of pyelonephritis  in the setting of obstructive uropathy.  Given high risk for sepsis, will cover broadly with meropenem for now. - Meropenem per pharmacy dosing - Urine culture pending - Will narrow antimicrobials based off culture data - IV fluids - Norco and Dilaudid for pain control - Zofran as needed for nausea   AKI (acute kidney injury) ([place]) In the setting of obstructive uropathy and profound dehydration. Continue IV fluids - Avoid nephrotoxic agents - Monitor urine output Creatinine level is improving  Hypokalemia Secondary to poor p.o. intake over the last several days. - EKG reviewed - Oral and IV potassium supplementation - Repeat BMP in the a.m.   Hyponatremia secondary to diarrhea vs nutritional deficiency Monitor sodium level daily Check serum osmolality   A-fib (HCC) - Held home Xarelto in the setting of ongoing hematuria with IDA - Continue home Amiodarone 1/19 resume Xarelto, patient was cleared by urology. Held Lopressor due to hypotension   Diarrhea Likely functional diarrhea in the setting of acute intra-abdominal infection.  No colitis noted on imaging. - Imodium as needed   Body mass index is 24.36 kg/m.  Interventions:      Diet: Heart healthy diet DVT Prophylaxis: Therapeutic Anticoagulation with Xarelto    Advance goals of care discussion: Full code  Family Communication: family was not present at bedside, at the time of interview.  The pt provided permission to discuss medical plan with the family. Opportunity was given to ask question and all questions were answered satisfactorily.   Disposition:  Pt is from Home, admitted with UTI and left ureteral stone status post stent insertion, urine culture is pending and patient is on IV antibiotics, which precludes a safe discharge. Discharge to Home, when clinically stable, may need 1-2 more  days..  Subjective: No significant events overnight, patient was having pain in the left lower quadrant which resolved  after pain medication.  Denied any nausea vomiting, no chest pain or palpitation, no shortness of breath.  Physical Exam: General: NAD, lying comfortably Appear in no distress, affect appropriate Eyes: PERRLA ENT: Oral Mucosa Clear, moist  Neck: no JVD,  Cardiovascular: S1 and S2 Present, no Murmur,  Respiratory: good respiratory effort, Bilateral Air entry equal and Decreased, no Crackles, no wheezes Abdomen: Bowel Sound present, Soft and mild LLQ tenderness,  Skin: no rashes Extremities: no Pedal edema, no calf tenderness Neurologic: without any new focal findings Gait not checked due to patient safety concerns  Vitals:   01/13/23 2022 01/13/23 2239 01/14/23 0549 01/14/23 0818  BP: (!) 114/52 (!) 103/91 (!) 179/139 97/62  Pulse: (!) 106 (!) 103 84 99  Resp: '18 18 20 18  '$ Temp: 97.7 F (36.5 C) 97.8 F (36.6 C) 98.4 F (36.9 C) 98.8 F (37.1 C)  TempSrc: Oral Oral Oral Oral  SpO2: 93% 94% 90% 94%  Weight:      Height:        Intake/Output Summary (Last 24 hours) at 01/14/2023 1428 Last data filed at 01/14/2023 0930 Gross per 24 hour  Intake 1340 ml  Output 650 ml  Net 690 ml   Filed Weights   01/13/23 1401  Weight: 66.4 kg    Data Reviewed: I have personally reviewed and interpreted daily labs, tele strips, imagings as discussed above. I reviewed all nursing notes, pharmacy notes, vitals, pertinent old records I have discussed plan of care as described above with RN and patient/family.  CBC: Recent Labs  Lab 01/13/23 1316 01/14/23 0327  WBC 7.7 5.3  HGB 9.9* 7.8*  HCT 31.2* 24.1*  MCV 81.9 82.3  PLT 126* 90*   Basic Metabolic Panel: Recent Labs  Lab 01/13/23 1316 01/14/23 0327  NA 131* 131*  K 2.8* 3.1*  CL 97* 101  CO2 23 22  GLUCOSE 142* 101*  BUN 28* 27*  CREATININE 1.45* 1.40*  CALCIUM 7.9* 7.5*  MG 1.9 1.8  PHOS  --  2.3*    Studies: DG OR UROLOGY CYSTO IMAGE ([facility] ONLY)  Result Date: 01/13/2023 There is no interpretation for this  exam.  This order is for images obtained during a surgical procedure.  Please See "Surgeries" Tab for more information regarding the procedure.   CT Abdomen Pelvis W Contrast  Result Date: 01/13/2023 CLINICAL DATA:  Abdominal pain and diarrhea since Sunday EXAM: CT ABDOMEN AND PELVIS WITH CONTRAST TECHNIQUE: Multidetector CT imaging of the abdomen and pelvis was performed using the standard protocol following bolus administration of intravenous contrast. RADIATION DOSE REDUCTION: This exam was performed according to the departmental dose-optimization program which includes automated exposure control, adjustment of the mA and/or kV according to patient size and/or use of iterative reconstruction technique. CONTRAST:  41m OMNIPAQUE IOHEXOL 300 MG/ML  SOLN COMPARISON:  08/27/2022 FINDINGS: Lower chest: No acute pleural or parenchymal lung disease. The inferior extent of the known descending thoracic aortic aneurysm is identified, measuring up to 4.9 cm in maximal transverse dimension reference image 60/6. This is incompletely assessed on this exam. Hepatobiliary: No focal liver abnormality is seen. No gallstones, gallbladder wall thickening, or biliary dilatation. Pancreas: Unremarkable. No pancreatic ductal dilatation or surrounding inflammatory changes. Spleen: Stable splenomegaly. Adrenals/Urinary Tract: There is an obstructing 7 mm distal left ureteral calculus reference image 72/2, with severe left-sided obstructive uropathy. There is marked left renal  edema, heterogeneous decreased enhancement, and perinephric stranding consistent with superimposed pyelonephritis. Debris is identified within the left renal collecting system. Incidental punctate 2 mm nonobstructing distal left ureteral calculus calculus is seen just proximal to the obstructing 7 mm calculus as well. Punctate 2 mm nonobstructing right renal calculus is unchanged. The right kidney enhances normally. The adrenals and bladder are unremarkable.  Stomach/Bowel: No bowel obstruction or ileus. No bowel wall thickening or inflammatory change. Vascular/Lymphatic: Stable aortic atherosclerosis. Subcentimeter left para-aortic lymph nodes are likely reactive. No pathologic adenopathy. Reproductive: Status post hysterectomy. No adnexal masses. Other: Mild left-sided retroperitoneal fat stranding secondary to the high-grade left-sided obstructive uropathy described above. No free intraperitoneal fluid or free gas. No abdominal wall hernia. Musculoskeletal: No acute or destructive bony lesions. Reconstructed images demonstrate no additional findings. IMPRESSION: 1. Obstructing 7 mm distal left ureteral calculus, with high-grade left-sided obstructive uropathy. Marked left renal edema, heterogeneous decreased enhancement, and perinephric fat stranding consistent with superimposed left-sided pyelonephritis. No renal abscess. 2. Punctate nonobstructing right renal calculus. 3. Stable 4.9 cm descending thoracic aortic aneurysm. This is incompletely evaluated on this study due to slice selection. 4.  Aortic Atherosclerosis (ICD10-I70.0). Electronically Signed   By: Randa Ngo M.D.   On: 01/13/2023 14:52    Scheduled Meds:  amiodarone  200 mg Oral Daily   atorvastatin  40 mg Oral q1800   levothyroxine  75 mcg Oral Q0600   metoprolol succinate  25 mg Oral QHS   sertraline  150 mg Oral Daily   Continuous Infusions:  lactated ringers     meropenem (MERREM) IV 1 g (01/14/23 0829)   potassium chloride 10 mEq (01/14/23 1349)   PRN Meds: acetaminophen **OR** acetaminophen, HYDROcodone-acetaminophen, HYDROmorphone (DILAUDID) injection, loperamide, ondansetron **OR** ondansetron (ZOFRAN) IV, polyethylene glycol  Time spent: 50 minutes  Author: Val Riles. MD [facility] 01/14/2023 2:28 PM  To reach On-call, see care teams to locate the attending and reach out to them via [email or web address]. If 7PM-7AM, please contact night-coverage If you still have  difficulty reaching the attending provider, please page the [facility] (Director on Call) for [facility] on amion for assistance.

## 2023-01-15 DIAGNOSIS — N138 Other obstructive and reflux uropathy: Secondary | ICD-10-CM | POA: Diagnosis not present

## 2023-01-15 LAB — BASIC METABOLIC PANEL
Anion gap: 9 (ref 5–15)
BUN: 29 mg/dL — ABNORMAL HIGH (ref 8–23)
CO2: 22 mmol/L (ref 22–32)
Calcium: 7.5 mg/dL — ABNORMAL LOW (ref 8.9–10.3)
Chloride: 100 mmol/L (ref 98–111)
Creatinine, Ser: 1.44 mg/dL — ABNORMAL HIGH (ref 0.44–1.00)
GFR, Estimated: 37 mL/min — ABNORMAL LOW (ref >60)
Glucose, Bld: 99 mg/dL (ref 70–99)
Potassium: 3.3 mmol/L — ABNORMAL LOW (ref 3.5–5.1)
Sodium: 131 mmol/L — ABNORMAL LOW (ref 135–145)

## 2023-01-15 LAB — CBC
HCT: 25.4 % — ABNORMAL LOW (ref 36.0–46.0)
Hemoglobin: 7.9 g/dL — ABNORMAL LOW (ref 12.0–15.0)
MCH: 25.6 pg — ABNORMAL LOW (ref 26.0–34.0)
MCHC: 31.1 g/dL (ref 30.0–36.0)
MCV: 82.2 fL (ref 80.0–100.0)
Platelets: 104 10*3/uL — ABNORMAL LOW (ref 150–400)
RBC: 3.09 MIL/uL — ABNORMAL LOW (ref 3.87–5.11)
RDW: 15.5 % (ref 11.5–15.5)
WBC: 4.6 10*3/uL (ref 4.0–10.5)
nRBC: 0 % (ref 0.0–0.2)

## 2023-01-15 LAB — URINE CULTURE: Culture: 50000 — AB

## 2023-01-15 LAB — PHOSPHORUS: Phosphorus: 2.4 mg/dL — ABNORMAL LOW (ref 2.5–4.6)

## 2023-01-15 LAB — MAGNESIUM: Magnesium: 1.9 mg/dL (ref 1.7–2.4)

## 2023-01-15 MED ORDER — K PHOS MONO-SOD PHOS DI & MONO 155-852-130 MG PO TABS
500.0000 mg | ORAL_TABLET | Freq: Three times a day (TID) | ORAL | Status: AC
  Administered 2023-01-15 (×3): 500 mg via ORAL
  Filled 2023-01-15 (×3): qty 2

## 2023-01-15 MED ORDER — SODIUM CHLORIDE 0.9 % IV SOLN
1.0000 g | INTRAVENOUS | Status: DC
  Administered 2023-01-15 – 2023-01-21 (×7): 1 g via INTRAVENOUS
  Filled 2023-01-15: qty 10
  Filled 2023-01-15: qty 1
  Filled 2023-01-15 (×2): qty 10
  Filled 2023-01-15: qty 1
  Filled 2023-01-15 (×2): qty 10
  Filled 2023-01-15: qty 1

## 2023-01-15 MED ORDER — ENSURE ENLIVE PO LIQD
237.0000 mL | Freq: Three times a day (TID) | ORAL | Status: DC
  Administered 2023-01-15 – 2023-01-22 (×11): 237 mL via ORAL

## 2023-01-15 MED ORDER — VITAMIN B-12 1000 MCG PO TABS
1000.0000 ug | ORAL_TABLET | Freq: Every day | ORAL | Status: DC
  Administered 2023-01-22: 1000 ug via ORAL
  Filled 2023-01-15: qty 1

## 2023-01-15 MED ORDER — SODIUM CHLORIDE 1 G PO TABS
1.0000 g | ORAL_TABLET | Freq: Three times a day (TID) | ORAL | Status: AC
  Administered 2023-01-15 – 2023-01-19 (×15): 1 g via ORAL
  Filled 2023-01-15 (×15): qty 1

## 2023-01-15 MED ORDER — VITAMIN D (ERGOCALCIFEROL) 1.25 MG (50000 UNIT) PO CAPS
50000.0000 [IU] | ORAL_CAPSULE | ORAL | Status: DC
  Administered 2023-01-15 – 2023-01-22 (×2): 50000 [IU] via ORAL
  Filled 2023-01-15 (×2): qty 1

## 2023-01-15 MED ORDER — CYANOCOBALAMIN 1000 MCG/ML IJ SOLN
1000.0000 ug | Freq: Every day | INTRAMUSCULAR | Status: AC
  Administered 2023-01-15 – 2023-01-21 (×7): 1000 ug via INTRAMUSCULAR
  Filled 2023-01-15 (×7): qty 1

## 2023-01-15 MED ORDER — POTASSIUM CHLORIDE CRYS ER 20 MEQ PO TBCR
20.0000 meq | EXTENDED_RELEASE_TABLET | Freq: Once | ORAL | Status: AC
  Administered 2023-01-15: 20 meq via ORAL
  Filled 2023-01-15: qty 1

## 2023-01-15 MED ORDER — METOPROLOL SUCCINATE ER 25 MG PO TB24
25.0000 mg | ORAL_TABLET | Freq: Every day | ORAL | Status: DC
  Filled 2023-01-15: qty 1

## 2023-01-15 NOTE — Progress Notes (Signed)
[facility] Progress Note  Patient: Tara Marquez    VVO:[id number]  DOA: 01/13/2023     Date of Service: the patient was seen and examined on 01/15/2023  Chief Complaint  Patient presents with   Diarrhea   Brief hospital course: Tara Marquez is a 82 y.o. female with medical history significant of atrial fibrillation, iron deficiency anemia, chronic hematuria of unknown etiology, hypertension, hyperlipidemia, who presents to the ED due to diarrhea.  For past 5 days patient is feeling generalized weakness, nausea vomiting and lack of appetite.  2 days ago she developed diarrhea, having intermittent abdominal pain.  Decreased oral intake. ED workup.  Hypokalemia potassium 2.8, hyponatremia sodium 131, AKI creatinine 1.45, anemia Hb 9.9, UA positive for UTI, CT A/P:1. Obstructing 7 mm distal left ureteral calculus, with high-grade left-sided obstructive uropathy. Marked left renal edema, heterogeneous decreased enhancement, and perinephric fat stranding consistent with superimposed left-sided pyelonephritis. No renal abscess. 2. Punctate nonobstructing right renal calculus.  3. Stable 4.9 cm descending thoracic aortic aneurysm. This is incompletely evaluated on this study due to slice selection. 4.  Aortic Atherosclerosis (ICD10-I70.0).   Urology was consulted and patient was admitted for further management as below.  Assessment and Plan:  # Obstructive nephropathy CT imaging demonstrating a 7 mm distal left ureteral calculus with severe left-sided obstructive uropathy.  Due to evidence of infection, urology was consulted, s/p left ureteral stent placement with Dr. Bernardo Heater. Oxybutynin 5 mg every 8 as needed for stent discomfort. empiric antibiotics and following cultures.   She will require a total of 14 days of culture appropriate therapy. Outpatient left URS/LL/stent exchange in 2 to 3 weeks, we will call to arrange   Acute pyelonephritis CT imaging with evidence of pyelonephritis  in the setting of obstructive uropathy.  Given high risk for sepsis, will cover broadly with meropenem for now. S/p Meropenem, Urine culture growing Klebsiella pneumonia, sensitive to cephalosporin.  Started ceftriaxone 1 g IV daily, plan is to transition to oral antibiotics on discharge. S/p IV fluids, continue oral hydration - Norco and Dilaudid for pain control - Zofran as needed for nausea   AKI (acute kidney injury) ([place]) In the setting of obstructive uropathy and profound dehydration. S/p IV fluids - Avoid nephrotoxic agents - Monitor urine output Creatinine level 1.4-1.49-1.44    # Hypokalemia, Secondary to poor p.o. intake over the last several days. Potassium repleted, monitor and replete as needed. # Hypophosphatemia, phosphorus repleted.  Continue nutritional support    # Isotonic hyponatremia secondary to diarrhea vs nutritional deficiency serum osmolality 281 within normal range Started sodium chloride 1 g p.o. 3 times daily Monitor sodium level daily   A-fib ([place]) - Held home Xarelto in the setting of ongoing hematuria with IDA - Continue home Amiodarone 1/19 resume Xarelto, patient was cleared by urology. Lopressor was held initially due to low blood pressure, resumed with holding parameters.  # Hypothyroid, continue Synthroid 75 mcg p.o. daily home dose # Depression, continue Zoloft home dose   Diarrhea Likely functional diarrhea in the setting of acute intra-abdominal infection.  No colitis noted on imaging. - Imodium as needed  Vitamin B12 deficiency: Started vitamin B12 1000 mcg IM injection daily during hospital stay, followed by oral supplement.  Follow-up PCP to repeat vitamin B12 level after 3 to 6 months. Anemia secondary to Y69 deficiency, folic acid within normal range, monitor H&H.  Vitamin D deficiency: started vitamin D 50,000 units p.o. weekly, follow with PCP to repeat vitamin D level  after 3 to 6 months.  Body mass index is 24.36 kg/m.   Interventions:      Diet: Regular diet DVT Prophylaxis: Therapeutic Anticoagulation with Xarelto    Advance goals of care discussion: Full code  Family Communication: family was  present at bedside, at the time of interview.  The pt provided permission to discuss medical plan with the family. Opportunity was given to ask question and all questions were answered satisfactorily.  1/20 discussed with patient's husband at bedside.  Disposition:  Pt is from Home, admitted with UTI and left ureteral stone status post stent insertion, urine culture is pending and patient is on IV antibiotics, which precludes a safe discharge. Discharge to Home, when clinically stable, may need 1-2 more days..  Subjective: No significant events overnight, pain is under control, patient wanted to change her diet to regular which has been done.  Electrolytes will be repleted.  Patient denies any other active issues.   Physical Exam: General: NAD, lying comfortably Appear in no distress, affect appropriate Eyes: PERRLA ENT: Oral Mucosa Clear, moist  Neck: no JVD,  Cardiovascular: S1 and S2 Present, no Murmur,  Respiratory: good respiratory effort, Bilateral Air entry equal and Decreased, no Crackles, no wheezes Abdomen: Bowel Sound present, Soft and mild LLQ tenderness,  Skin: no rashes Extremities: no Pedal edema, no calf tenderness Neurologic: without any new focal findings Gait not checked due to patient safety concerns  Vitals:   01/14/23 1531 01/14/23 2127 01/15/23 0536 01/15/23 0813  BP: (!) 126/110 (!) 112/52 (!) 145/133 (!) 111/50  Pulse: 96 91 94 (!) 102  Resp: '20 19 20 16  '$ Temp: 97.9 F (36.6 C) (!) 97.5 F (36.4 C) 99.1 F (37.3 C) (!) 97.5 F (36.4 C)  TempSrc:   Oral Oral  SpO2: 98% 95% 92% 96%  Weight:      Height:       No intake or output data in the 24 hours ending 01/15/23 1219  Filed Weights   01/13/23 1401  Weight: 66.4 kg    Data Reviewed: I have personally reviewed  and interpreted daily labs, tele strips, imagings as discussed above. I reviewed all nursing notes, pharmacy notes, vitals, pertinent old records I have discussed plan of care as described above with RN and patient/family.  CBC: Recent Labs  Lab 01/13/23 1316 01/14/23 0327 01/15/23 0658  WBC 7.7 5.3 4.6  HGB 9.9* 7.8* 7.9*  HCT 31.2* 24.1* 25.4*  MCV 81.9 82.3 82.2  PLT 126* 90* 245*   Basic Metabolic Panel: Recent Labs  Lab 01/13/23 1316 01/14/23 0327 01/14/23 1419 01/15/23 0658  NA 131* 131* 130* 131*  K 2.8* 3.1* 3.5 3.3*  CL 97* 101 99 100  CO2 '23 22 23 22  '$ GLUCOSE 142* 101* 117* 99  BUN 28* 27* 28* 29*  CREATININE 1.45* 1.40* 1.49* 1.44*  CALCIUM 7.9* 7.5* 7.9* 7.5*  MG 1.9 1.8  --  1.9  PHOS  --  2.3*  --  2.4*    Studies: No results found.  Scheduled Meds:  amiodarone  200 mg Oral Daily   atorvastatin  40 mg Oral q1800   cyanocobalamin  1,000 mcg Intramuscular Daily   Followed by   Derrill Memo ON 01/22/2023] vitamin B-12  1,000 mcg Oral Daily   levothyroxine  75 mcg Oral Q0600   metoprolol succinate  25 mg Oral QHS   phosphorus  500 mg Oral TID   Rivaroxaban  15 mg Oral Q supper   sertraline  150 mg Oral Daily   sodium chloride  1 g Oral TID WC   Vitamin D (Ergocalciferol)  50,000 Units Oral Q7 days   Continuous Infusions:  cefTRIAXone (ROCEPHIN)  IV     lactated ringers     PRN Meds: acetaminophen **OR** acetaminophen, HYDROcodone-acetaminophen, HYDROmorphone (DILAUDID) injection, loperamide, ondansetron **OR** ondansetron (ZOFRAN) IV, polyethylene glycol  Time spent: 50 minutes  Author: Val Riles. MD [facility] 01/15/2023 12:19 PM  To reach On-call, see care teams to locate the attending and reach out to them via [email or web address]. If 7PM-7AM, please contact night-coverage If you still have difficulty reaching the attending provider, please page the [facility] (Director on Call) for [facility] on amion for assistance.

## 2023-01-16 DIAGNOSIS — N138 Other obstructive and reflux uropathy: Secondary | ICD-10-CM | POA: Diagnosis not present

## 2023-01-16 LAB — BASIC METABOLIC PANEL
Anion gap: 6 (ref 5–15)
BUN: 26 mg/dL — ABNORMAL HIGH (ref 8–23)
CO2: 24 mmol/L (ref 22–32)
Calcium: 7.4 mg/dL — ABNORMAL LOW (ref 8.9–10.3)
Chloride: 103 mmol/L (ref 98–111)
Creatinine, Ser: 1.26 mg/dL — ABNORMAL HIGH (ref 0.44–1.00)
GFR, Estimated: 43 mL/min — ABNORMAL LOW (ref >60)
Glucose, Bld: 104 mg/dL — ABNORMAL HIGH (ref 70–99)
Potassium: 3.5 mmol/L (ref 3.5–5.1)
Sodium: 133 mmol/L — ABNORMAL LOW (ref 135–145)

## 2023-01-16 LAB — CBC
HCT: 25.8 % — ABNORMAL LOW (ref 36.0–46.0)
Hemoglobin: 8.1 g/dL — ABNORMAL LOW (ref 12.0–15.0)
MCH: 25.8 pg — ABNORMAL LOW (ref 26.0–34.0)
MCHC: 31.4 g/dL (ref 30.0–36.0)
MCV: 82.2 fL (ref 80.0–100.0)
Platelets: 145 10*3/uL — ABNORMAL LOW (ref 150–400)
RBC: 3.14 MIL/uL — ABNORMAL LOW (ref 3.87–5.11)
RDW: 15.9 % — ABNORMAL HIGH (ref 11.5–15.5)
WBC: 5.8 10*3/uL (ref 4.0–10.5)
nRBC: 0 % (ref 0.0–0.2)

## 2023-01-16 LAB — PHOSPHORUS: Phosphorus: 2.6 mg/dL (ref 2.5–4.6)

## 2023-01-16 LAB — MAGNESIUM: Magnesium: 2 mg/dL (ref 1.7–2.4)

## 2023-01-16 MED ORDER — PANTOPRAZOLE SODIUM 40 MG PO TBEC
40.0000 mg | DELAYED_RELEASE_TABLET | Freq: Every day | ORAL | Status: DC
  Administered 2023-01-17 – 2023-01-22 (×6): 40 mg via ORAL
  Filled 2023-01-16 (×6): qty 1

## 2023-01-16 MED ORDER — PANTOPRAZOLE SODIUM 40 MG IV SOLR
40.0000 mg | Freq: Once | INTRAVENOUS | Status: AC
  Administered 2023-01-16: 40 mg via INTRAVENOUS
  Filled 2023-01-16: qty 10

## 2023-01-16 MED ORDER — SODIUM CHLORIDE 0.9 % IV SOLN: INTRAVENOUS | Status: AC

## 2023-01-16 MED ORDER — SACCHAROMYCES BOULARDII 250 MG PO CAPS
250.0000 mg | ORAL_CAPSULE | Freq: Two times a day (BID) | ORAL | Status: DC
  Administered 2023-01-16 – 2023-01-22 (×13): 250 mg via ORAL
  Filled 2023-01-16 (×13): qty 1

## 2023-01-16 MED ORDER — PANTOPRAZOLE SODIUM 40 MG PO TBEC: 40.0000 mg | DELAYED_RELEASE_TABLET | Freq: Every day | ORAL | Status: DC

## 2023-01-16 MED ORDER — METOPROLOL SUCCINATE ER 25 MG PO TB24
25.0000 mg | ORAL_TABLET | Freq: Every day | ORAL | Status: DC
  Administered 2023-01-17 – 2023-01-22 (×5): 25 mg via ORAL
  Filled 2023-01-16 (×6): qty 1

## 2023-01-16 NOTE — Evaluation (Signed)
Physical Therapy Evaluation Patient Details Name: Tara Marquez MRN: [id number] DOB: 08/22/1941 Today's Date: 01/16/2023  History of Present Illness  Pt is an 82 y/o F admitted on 01/13/23 after presenting to the ED with c/o diarrhea. CT imaging demonstrating a 7 mm distal left ureteral calculus with severe left-sided obstructive uropathy & with evidence of pyelonephritis in the setting of obstructive uropathy. Pt is s/p L ureteral stent placement. PMH: a-fib, iron deficiency anemia, chronic hematuria of unknown etiology, HTN, HLD  Clinical Impression  Pt seen for PT evaluation with pt agreeable. Pt reports prior to admission she was independent without AD, residing on the main level of a 2 story home with 3 steps with L rail to enter, with her husband, Glendell Docker. On this date, pt is able to complete bed mobility with mod I, STS & gait with CGA, increasing to min assist for gait after pt reports episode of dizziness (that did improve with standing rest break).  Pt requested to return to room 2/2 dizziness, nausea & fatigue. Anticipate pt will increase independence with mobility the more she ambulates while in acute setting. Will continue to follow pt acutely to address gait, stair negotiation & balance, but do not anticipate pt will require formal PT f/u upon d/c.   Recommendations for follow up therapy are one component of a multi-disciplinary discharge planning process, led by the attending physician.  Recommendations may be updated based on patient status, additional functional criteria and insurance authorization.  Follow Up Recommendations No PT follow up      Assistance Recommended at Discharge Set up Supervision/Assistance  Patient can return home with the following  Assistance with cooking/housework;Help with stairs or ramp for entrance;Assist for transportation    Equipment Recommendations None recommended by PT  Recommendations for Other Services       Functional Status Assessment Patient  has had a recent decline in their functional status and demonstrates the ability to make significant improvements in function in a reasonable and predictable amount of time.     Precautions / Restrictions Precautions Precautions: Fall Restrictions Weight Bearing Restrictions: No      Mobility  Bed Mobility Overal bed mobility: Modified Independent             General bed mobility comments: supine>sit with HOB elevated, use of bed rails    Transfers Overall transfer level: Needs assistance Equipment used: None Transfers: Sit to/from Stand Sit to Stand: Min guard                Ambulation/Gait Ambulation/Gait assistance: Min assist, Min guard Gait Distance (Feet): 180 Feet Assistive device: None Gait Pattern/deviations: Decreased step length - left, Decreased dorsiflexion - right, Decreased step length - right, Decreased dorsiflexion - left, Decreased stride length Gait velocity: decreased     General Gait Details: Pt requires frequent cuing to stop reaching for rails for support. Pt with c/o dizziness x 1 with 1 standing rest break & pt reporting improvement in symptoms but PT providing min assist afterwards for increased safety.  Stairs            Wheelchair Mobility    Modified Rankin (Stroke Patients Only)       Balance Overall balance assessment: Needs assistance Sitting-balance support: Feet supported, No upper extremity supported Sitting balance-Leahy Scale: Good     Standing balance support: During functional activity, No upper extremity supported Standing balance-Leahy Scale: Fair  Pertinent Vitals/Pain Pain Assessment Pain Assessment: No/denies pain (pt only with c/o nausea)    Home Living Family/patient expects to be discharged to:: Private residence Living Arrangements: Spouse/significant other;Children Available Help at Discharge: Family Type of Home: House Home Access: Stairs to  enter Entrance Stairs-Rails: Left Entrance Stairs-Number of Steps: 3   Home Layout: Two level;Able to live on main level with bedroom/bathroom Home Equipment: Rollator (4 wheels)      Prior Function Prior Level of Function : Independent/Modified Independent;Driving             Mobility Comments: Independent without AD, driving.       Hand Dominance        Extremity/Trunk Assessment   Upper Extremity Assessment Upper Extremity Assessment: Overall WFL for tasks assessed    Lower Extremity Assessment Lower Extremity Assessment: Generalized weakness    Cervical / Trunk Assessment Cervical / Trunk Assessment:  (rounded shoulders)  Communication   Communication: No difficulties  Cognition Arousal/Alertness: Awake/alert Behavior During Therapy: WFL for tasks assessed/performed Overall Cognitive Status: Within Functional Limits for tasks assessed                                          General Comments      Exercises     Assessment/Plan    PT Assessment Patient needs continued PT services  PT Problem List Decreased strength;Decreased activity tolerance;Decreased balance;Decreased mobility;Decreased safety awareness;Decreased knowledge of use of DME       PT Treatment Interventions DME instruction;Therapeutic exercise;Gait training;Balance training;Stair training;Neuromuscular re-education;Functional mobility training;Therapeutic activities;Patient/family education    PT Goals (Current goals can be found in the Care Plan section)  Acute Rehab PT Goals Patient Stated Goal: feel better PT Goal Formulation: With patient Time For Goal Achievement: 01/30/23 Potential to Achieve Goals: Good    Frequency Min 2X/week     Co-evaluation               AM-PAC PT "6 Clicks" Mobility  Outcome Measure Help needed turning from your back to your side while in a flat bed without using bedrails?: None Help needed moving from lying on your back to  sitting on the side of a flat bed without using bedrails?: None Help needed moving to and from a bed to a chair (including a wheelchair)?: A Little Help needed standing up from a chair using your arms (e.g., wheelchair or bedside chair)?: A Little Help needed to walk in hospital room?: A Little Help needed climbing 3-5 steps with a railing? : A Little 6 Click Score: 20    End of Session   Activity Tolerance: Patient limited by fatigue (limited by dizziness, nausea) Patient left: in chair;with chair alarm set;with call bell/phone within reach;with family/visitor present Nurse Communication: Mobility status PT Visit Diagnosis: Unsteadiness on feet (R26.81);Muscle weakness (generalized) (M62.81)    Time: 2952-8413 PT Time Calculation (min) (ACUTE ONLY): 15 min   Charges:   PT Evaluation $PT Eval Low Complexity: [place], PT, DPT 01/16/23, 10:10 AM   Waunita Schooner 01/16/2023, 10:08 AM

## 2023-01-16 NOTE — Evaluation (Signed)
Occupational Therapy Evaluation Patient Details Name: Tara Marquez MRN: [id number] DOB: 1941-08-11 Today's Date: 01/16/2023   History of Present Illness Pt is an 82 y/o F admitted on 01/13/23 after presenting to the ED with c/o diarrhea. CT imaging demonstrating a 7 mm distal left ureteral calculus with severe left-sided obstructive uropathy & with evidence of pyelonephritis in the setting of obstructive uropathy. Pt is s/p L ureteral stent placement. PMH: a-fib, iron deficiency anemia, chronic hematuria of unknown etiology, HTN, HLD   Clinical Impression   Patient seen for OT evaluation, spouse present. Pt presenting with decreased independence in self care, balance, functional mobility/transfers, and endurance. PTA pt was independent for ADLs, independent for functional mobility without an AD, and still driving. Pt currently functioning at Mod I for bed mobility, Min guard for functional mobility to the bathroom (use of IV pole for support 2/2 fatigue), Min guard for toilet transfer, supervision for posterior hygiene in sitting, and CGA-Min A for LB dressing. Pt will benefit from acute OT to increase overall independence in the areas of ADLs and functional mobility in order to safely discharge home. Anticipate pt to make necessary progress with ADL tasks while in hospital to not require formal f/u therapy at D/C.      Recommendations for follow up therapy are one component of a multi-disciplinary discharge planning process, led by the attending physician.  Recommendations may be updated based on patient status, additional functional criteria and insurance authorization.   Follow Up Recommendations  No OT follow up     Assistance Recommended at Discharge Set up Supervision/Assistance  Patient can return home with the following Assistance with cooking/housework;Assist for transportation;Help with stairs or ramp for entrance    Functional Status Assessment  Patient has had a recent decline in  their functional status and demonstrates the ability to make significant improvements in function in a reasonable and predictable amount of time.  Equipment Recommendations  None recommended by OT    Recommendations for Other Services       Precautions / Restrictions Precautions Precautions: Fall Restrictions Weight Bearing Restrictions: No      Mobility Bed Mobility Overal bed mobility: Modified Independent             General bed mobility comments: use of bed rails    Transfers Overall transfer level: Needs assistance Equipment used: None Transfers: Sit to/from Stand Sit to Stand: Min guard                  Balance Overall balance assessment: Needs assistance Sitting-balance support: Feet supported, No upper extremity supported Sitting balance-Leahy Scale: Good     Standing balance support: During functional activity, Single extremity supported, No upper extremity supported Standing balance-Leahy Scale: Fair                             ADL either performed or assessed with clinical judgement   ADL Overall ADL's : Needs assistance/impaired     Grooming: Supervision/safety;Standing;Wash/dry hands               Lower Body Dressing: Minimal assistance;Sitting/lateral leans;Min guard;Sit to/from stand Lower Body Dressing Details (indicate cue type and reason): Min A to thread underwear over B feet while sitting on toilet, Min guard for safety to hike over B hips in standing Toilet Transfer: Min guard;Regular Toilet;Grab bars Toilet Transfer Details (indicate cue type and reason): use of grab bar 2/2 lower transfer surface Toileting- Clothing Manipulation  and Hygiene: Supervision/safety;Sitting/lateral lean Toileting - Clothing Manipulation Details (indicate cue type and reason): for posterior hygiene after continent BM on toilet     Functional mobility during ADLs: Min guard (to the bathroom, use of IV pole for support 2/2 fatigue)        Vision Patient Visual Report: No change from baseline       Perception     Praxis      Pertinent Vitals/Pain Pain Assessment Pain Assessment: No/denies pain     Hand Dominance     Extremity/Trunk Assessment Upper Extremity Assessment Upper Extremity Assessment: Overall WFL for tasks assessed   Lower Extremity Assessment Lower Extremity Assessment: Generalized weakness       Communication Communication Communication: No difficulties   Cognition Arousal/Alertness: Awake/alert Behavior During Therapy: WFL for tasks assessed/performed Overall Cognitive Status: Within Functional Limits for tasks assessed                                       General Comments       Exercises Other Exercises Other Exercises: OT provided education re: role of OT, OT POC, post acute recs, sitting up for all meals, EOB/OOB mobility with assistance, home/fall safety.     Shoulder Instructions      Home Living Family/patient expects to be discharged to:: Private residence Living Arrangements: Spouse/significant other;Children Available Help at Discharge: Family Type of Home: House Home Access: Stairs to enter Technical brewer of Steps: 3 Entrance Stairs-Rails: Left Home Layout: Two level;Able to live on main level with bedroom/bathroom     Bathroom Shower/Tub: Occupational psychologist: Handicapped height     Home Equipment: Rollator (4 wheels);Grab bars - tub/shower;Shower seat          Prior Functioning/Environment Prior Level of Function : Independent/Modified Independent;Driving             Mobility Comments: Independent without AD, driving. ADLs Comments: independent        OT Problem List: Decreased strength;Decreased activity tolerance;Impaired balance (sitting and/or standing);Decreased knowledge of use of DME or AE      OT Treatment/Interventions: Self-care/ADL training;Therapeutic exercise;Therapeutic activities;Energy  conservation;DME and/or AE instruction;Patient/family education;Balance training    OT Goals(Current goals can be found in the care plan section) Acute Rehab OT Goals Patient Stated Goal: return home OT Goal Formulation: With patient/family Time For Goal Achievement: 01/30/23 Potential to Achieve Goals: Good   OT Frequency: Min 2X/week    Co-evaluation              AM-PAC OT "6 Clicks" Daily Activity     Outcome Measure Help from another person eating meals?: None Help from another person taking care of personal grooming?: None Help from another person toileting, which includes using toliet, bedpan, or urinal?: A Little Help from another person bathing (including washing, rinsing, drying)?: A Little Help from another person to put on and taking off regular upper body clothing?: None Help from another person to put on and taking off regular lower body clothing?: A Little 6 Click Score: 21   End of Session Equipment Utilized During Treatment: Gait belt Nurse Communication: Mobility status  Activity Tolerance: Patient tolerated treatment well Patient left: in bed;with call bell/phone within reach;with bed alarm set;with family/visitor present  OT Visit Diagnosis: Unsteadiness on feet (R26.81);Muscle weakness (generalized) (M62.81)  Time: 3329-5188 OT Time Calculation (min): 15 min Charges:  OT General Charges $OT Visit: 1 Visit OT Evaluation $OT Eval Low Complexity: 1 Low  [facility] MS, OTR/L ascom [phone]  01/16/23, 4:39 PM

## 2023-01-16 NOTE — Progress Notes (Signed)
[facility] Progress Note  Patient: Tara Marquez    LKT:[id number]  DOA: 01/13/2023     Date of Service: the patient was seen and examined on 01/16/2023  Chief Complaint  Patient presents with   Diarrhea   Brief hospital course: Tara Marquez is a 82 y.o. female with medical history significant of atrial fibrillation, iron deficiency anemia, chronic hematuria of unknown etiology, hypertension, hyperlipidemia, who presents to the ED due to diarrhea.  For past 5 days patient is feeling generalized weakness, nausea vomiting and lack of appetite.  2 days ago she developed diarrhea, having intermittent abdominal pain.  Decreased oral intake. ED workup.  Hypokalemia potassium 2.8, hyponatremia sodium 131, AKI creatinine 1.45, anemia Hb 9.9, UA positive for UTI, CT A/P:1. Obstructing 7 mm distal left ureteral calculus, with high-grade left-sided obstructive uropathy. Marked left renal edema, heterogeneous decreased enhancement, and perinephric fat stranding consistent with superimposed left-sided pyelonephritis. No renal abscess. 2. Punctate nonobstructing right renal calculus.  3. Stable 4.9 cm descending thoracic aortic aneurysm. This is incompletely evaluated on this study due to slice selection. 4.  Aortic Atherosclerosis (ICD10-I70.0).   Urology was consulted and patient was admitted for further management as below.  Assessment and Plan:  # Obstructive nephropathy CT imaging demonstrating a 7 mm distal left ureteral calculus with severe left-sided obstructive uropathy.  Due to evidence of infection, urology was consulted, s/p left ureteral stent placement with Dr. Bernardo Heater. Oxybutynin 5 mg every 8 as needed for stent discomfort. empiric antibiotics and following cultures.   She will require a total of 14 days of culture appropriate therapy. Outpatient left URS/LL/stent exchange in 2 to 3 weeks, we will call to arrange   Acute pyelonephritis CT imaging with evidence of pyelonephritis  in the setting of obstructive uropathy.  Given high risk for sepsis, will cover broadly with meropenem for now. S/p Meropenem, Urine culture growing Klebsiella pneumonia, sensitive to cephalosporin.  Started ceftriaxone 1 g IV daily, plan is to transition to oral antibiotics on discharge. S/p IV fluids, continue oral hydration - Norco and Dilaudid for pain control - Zofran as needed for nausea, started PPI for possible GERD    AKI (acute kidney injury) ([place]) In the setting of obstructive uropathy and profound dehydration. Started IV fluids for gentle hydration - Avoid nephrotoxic agents - Monitor urine output Creatinine level 1.4-1.49-1.44 --1.26    # Hypokalemia, Secondary to poor p.o. intake over the last several days. Potassium repleted, monitor and replete as needed. # Hypophosphatemia, phosphorus repleted.  Continue nutritional support    # Isotonic hyponatremia secondary to diarrhea vs nutritional deficiency serum osmolality 281 within normal range Started sodium chloride 1 g p.o. 3 times daily Monitor sodium level daily   A-fib ([place]) - Held home Xarelto in the setting of ongoing hematuria with IDA - Continue home Amiodarone 1/19 resume Xarelto, patient was cleared by urology. Lopressor was held initially due to low blood pressure, resumed with holding parameters.  # Hypothyroid, continue Synthroid 75 mcg p.o. daily home dose # Depression, continue Zoloft home dose   Diarrhea Likely functional diarrhea in the setting of acute intra-abdominal infection.  No colitis noted on imaging. - Imodium as needed Started probiotics   Vitamin B12 deficiency: Vitamin B12 level 62, goal >400.  Started vitamin B12 1000 mcg IM injection daily during hospital stay, followed by oral supplement.  Follow-up PCP to repeat vitamin B12 level after 3 to 6 months. Anemia secondary to D42 deficiency, folic acid within normal range, monitor  H&H.  Vitamin D deficiency: started vitamin D 50,000  units p.o. weekly, follow with PCP to repeat vitamin D level after 3 to 6 months.  Body mass index is 24.36 kg/m.  Interventions:      Diet: Regular diet DVT Prophylaxis: Therapeutic Anticoagulation with Xarelto    Advance goals of care discussion: Full code  Family Communication: family was  present at bedside, at the time of interview.  The pt provided permission to discuss medical plan with the family. Opportunity was given to ask question and all questions were answered satisfactorily.  1/20 discussed with patient's husband at bedside.  Disposition:  Pt is from Home, admitted with UTI and left ureteral stone status post stent insertion, urine culture is pending and patient is on IV antibiotics, which precludes a safe discharge. Discharge to Home, when clinically stable, may need 1-2 more days.. PT and OT consulted  Subjective: No significant events overnight, in the morning time patient was feeling nauseous, no abdominal pain, patient said that she is still having diarrhea 1-2 episodes yesterday and one episode today morning. Patient still has low appetite and feels very weak, we will start gentle IV hydration, started PPI and probiotics, we will ambulate her today and plan for discharge tomorrow a.m. if remains stable.    Physical Exam: General: NAD, lying comfortably Appear in no distress, affect appropriate Eyes: PERRLA ENT: Oral Mucosa Clear, moist  Neck: no JVD,  Cardiovascular: S1 and S2 Present, no Murmur,  Respiratory: good respiratory effort, Bilateral Air entry equal and Decreased, no Crackles, no wheezes Abdomen: Bowel Sound present, Soft and mild LLQ tenderness,  Skin: no rashes Extremities: no Pedal edema, no calf tenderness Neurologic: without any new focal findings Gait not checked due to patient safety concerns  Vitals:   01/15/23 1643 01/15/23 2148 01/16/23 0444 01/16/23 0858  BP: (!) 102/58 (!) 96/58 (!) 102/46 112/73  Pulse: 90 97 98 99  Resp: '18 16  18 16  '$ Temp: 98.4 F (36.9 C) 98.5 F (36.9 C) 99 F (37.2 C) (!) 97.4 F (36.3 C)  TempSrc:      SpO2: 92% 94% 96% 98%  Weight:      Height:       No intake or output data in the 24 hours ending 01/16/23 1117  Filed Weights   01/13/23 1401  Weight: 66.4 kg    Data Reviewed: I have personally reviewed and interpreted daily labs, tele strips, imagings as discussed above. I reviewed all nursing notes, pharmacy notes, vitals, pertinent old records I have discussed plan of care as described above with RN and patient/family.  CBC: Recent Labs  Lab 01/13/23 1316 01/14/23 0327 01/15/23 0658 01/16/23 0326  WBC 7.7 5.3 4.6 5.8  HGB 9.9* 7.8* 7.9* 8.1*  HCT 31.2* 24.1* 25.4* 25.8*  MCV 81.9 82.3 82.2 82.2  PLT 126* 90* 104* 786*   Basic Metabolic Panel: Recent Labs  Lab 01/13/23 1316 01/14/23 0327 01/14/23 1419 01/15/23 0658 01/16/23 0326  NA 131* 131* 130* 131* 133*  K 2.8* 3.1* 3.5 3.3* 3.5  CL 97* 101 99 100 103  CO2 '23 22 23 22 24  '$ GLUCOSE 142* 101* 117* 99 104*  BUN 28* 27* 28* 29* 26*  CREATININE 1.45* 1.40* 1.49* 1.44* 1.26*  CALCIUM 7.9* 7.5* 7.9* 7.5* 7.4*  MG 1.9 1.8  --  1.9 2.0  PHOS  --  2.3*  --  2.4* 2.6    Studies: No results found.  Scheduled Meds:  amiodarone  200  mg Oral Daily   atorvastatin  40 mg Oral q1800   cyanocobalamin  1,000 mcg Intramuscular Daily   Followed by   Derrill Memo ON 01/22/2023] vitamin B-12  1,000 mcg Oral Daily   feeding supplement  237 mL Oral TID BM   levothyroxine  75 mcg Oral Q0600   [START ON 01/17/2023] metoprolol succinate  25 mg Oral Daily   [START ON 01/17/2023] pantoprazole  40 mg Oral QAC breakfast   Rivaroxaban  15 mg Oral Q supper   saccharomyces boulardii  250 mg Oral BID   sertraline  150 mg Oral Daily   sodium chloride  1 g Oral TID WC   Vitamin D (Ergocalciferol)  50,000 Units Oral Q7 days   Continuous Infusions:  sodium chloride 75 mL/hr at 01/16/23 1109   cefTRIAXone (ROCEPHIN)  IV 1 g (01/15/23  2033)   lactated ringers     PRN Meds: acetaminophen **OR** acetaminophen, HYDROcodone-acetaminophen, HYDROmorphone (DILAUDID) injection, loperamide, ondansetron **OR** ondansetron (ZOFRAN) IV, polyethylene glycol  Time spent: 50 minutes  Author: Val Riles. MD [facility] 01/16/2023 11:17 AM  To reach On-call, see care teams to locate the attending and reach out to them via [email or web address]. If 7PM-7AM, please contact night-coverage If you still have difficulty reaching the attending provider, please page the [facility] (Director on Call) for [facility] on amion for assistance.

## 2023-01-17 DIAGNOSIS — N138 Other obstructive and reflux uropathy: Secondary | ICD-10-CM | POA: Diagnosis not present

## 2023-01-17 LAB — BASIC METABOLIC PANEL
Anion gap: 7 (ref 5–15)
BUN: 18 mg/dL (ref 8–23)
CO2: 23 mmol/L (ref 22–32)
Calcium: 7.2 mg/dL — ABNORMAL LOW (ref 8.9–10.3)
Chloride: 106 mmol/L (ref 98–111)
Creatinine, Ser: 1.21 mg/dL — ABNORMAL HIGH (ref 0.44–1.00)
GFR, Estimated: 45 mL/min — ABNORMAL LOW (ref >60)
Glucose, Bld: 105 mg/dL — ABNORMAL HIGH (ref 70–99)
Potassium: 3.2 mmol/L — ABNORMAL LOW (ref 3.5–5.1)
Sodium: 136 mmol/L (ref 135–145)

## 2023-01-17 LAB — CBC
HCT: 23.8 % — ABNORMAL LOW (ref 36.0–46.0)
Hemoglobin: 7.5 g/dL — ABNORMAL LOW (ref 12.0–15.0)
MCH: 25.8 pg — ABNORMAL LOW (ref 26.0–34.0)
MCHC: 31.5 g/dL (ref 30.0–36.0)
MCV: 81.8 fL (ref 80.0–100.0)
Platelets: 178 10*3/uL (ref 150–400)
RBC: 2.91 MIL/uL — ABNORMAL LOW (ref 3.87–5.11)
RDW: 16.1 % — ABNORMAL HIGH (ref 11.5–15.5)
WBC: 5.1 10*3/uL (ref 4.0–10.5)
nRBC: 0 % (ref 0.0–0.2)

## 2023-01-17 LAB — PHOSPHORUS: Phosphorus: 2.6 mg/dL (ref 2.5–4.6)

## 2023-01-17 LAB — MAGNESIUM: Magnesium: 1.8 mg/dL (ref 1.7–2.4)

## 2023-01-17 MED ORDER — POTASSIUM CHLORIDE CRYS ER 20 MEQ PO TBCR
40.0000 meq | EXTENDED_RELEASE_TABLET | Freq: Once | ORAL | Status: AC
  Administered 2023-01-17: 40 meq via ORAL
  Filled 2023-01-17: qty 2

## 2023-01-17 MED ORDER — CYANOCOBALAMIN 1000 MCG PO TABS: 1000.0000 ug | ORAL_TABLET | Freq: Every day | ORAL | 1 refills | Status: AC

## 2023-01-17 MED ORDER — CIPROFLOXACIN HCL 500 MG PO TABS: 500.0000 mg | ORAL_TABLET | Freq: Two times a day (BID) | ORAL | 0 refills | Status: DC

## 2023-01-17 MED ORDER — PANTOPRAZOLE SODIUM 40 MG PO TBEC: 40.0000 mg | DELAYED_RELEASE_TABLET | Freq: Every day | ORAL | 0 refills | Status: DC

## 2023-01-17 MED ORDER — POTASSIUM CHLORIDE 10 MEQ/100ML IV SOLN
10.0000 meq | INTRAVENOUS | Status: AC
  Administered 2023-01-17 (×2): 10 meq via INTRAVENOUS
  Filled 2023-01-17 (×3): qty 100

## 2023-01-17 MED ORDER — SACCHAROMYCES BOULARDII 250 MG PO CAPS: 250.0000 mg | ORAL_CAPSULE | Freq: Two times a day (BID) | ORAL | 0 refills | Status: AC

## 2023-01-17 MED ORDER — GUAIFENESIN-DM 100-10 MG/5ML PO SYRP
5.0000 mL | ORAL_SOLUTION | ORAL | Status: DC | PRN
  Administered 2023-01-17 – 2023-01-20 (×4): 5 mL via ORAL
  Filled 2023-01-17 (×4): qty 10

## 2023-01-17 MED ORDER — VITAMIN D (ERGOCALCIFEROL) 1.25 MG (50000 UNIT) PO CAPS: 50000.0000 [IU] | ORAL_CAPSULE | ORAL | 0 refills | Status: AC

## 2023-01-17 NOTE — Progress Notes (Signed)
Occupational Therapy Treatment Patient Details Name: Tara Marquez MRN: [id number] DOB: [birth year] Today's Date: 01/17/2023   History of present illness Pt is an 82 y/o F admitted on 01/13/23 after presenting to the ED with c/o diarrhea. CT imaging demonstrating a 7 mm distal left ureteral calculus with severe left-sided obstructive uropathy & with evidence of pyelonephritis in the setting of obstructive uropathy. Pt is s/p L ureteral stent placement. PMH: a-fib, iron deficiency anemia, chronic hematuria of unknown etiology, HTN, HLD   OT comments  Upon entering session, pt supine in bed and agreeable to OT. Tx session focused on energy conservation techniques to improve activity tolerance and safety with ADLs. Education included the 4 P's (plan, prioritize, pace, position) of energy conservation, pursed lip breathing, diaphragmatic breathing, and specific strategies for ADLs (toileting, grooming, bathing/showering, dressing). OT discussed specific examples of sitting when possible to complete ADL tasks (I.e. dressing, bathing, meal prep), gathering all the necessary items in advance for a certain task, avoiding excessive bending/reaching, and taking rest breaks before you feel tired. Pt demonstrated good carryover of education provided by OT. Pt will benefit from further opportunities to practice implementing energy conservation techniques during self-care tasks before returning home. During session this date, pt able to complete bed mobility with Mod I, functional transfers with Min guard, and functional mobility to bedside chair with Min guard and no AD. Pt completed LB dressing with supervision-CGA and seated grooming tasks with set up A. Pt left sitting up in recliner with all needs in reach. Pt is making progress toward goal completion. D/C recommendation remains appropriate. OT will continue to follow acutely.    Recommendations for follow up therapy are one component of a multi-disciplinary discharge  planning process, led by the attending physician.  Recommendations may be updated based on patient status, additional functional criteria and insurance authorization.    Follow Up Recommendations  No OT follow up     Assistance Recommended at Discharge Set up Supervision/Assistance  Patient can return home with the following  Assistance with cooking/housework;Assist for transportation;Help with stairs or ramp for entrance   Equipment Recommendations  None recommended by OT    Recommendations for Other Services      Precautions / Restrictions Precautions Precautions: Fall Restrictions Weight Bearing Restrictions: No       Mobility Bed Mobility Overal bed mobility: Modified Independent                  Transfers Overall transfer level: Needs assistance Equipment used: None Transfers: Sit to/from Stand Sit to Stand: Min guard                 Balance Overall balance assessment: Needs assistance Sitting-balance support: Feet supported, No upper extremity supported       Standing balance support: During functional activity, No upper extremity supported Standing balance-Leahy Scale: Fair                             ADL either performed or assessed with clinical judgement   ADL Overall ADL's : Needs assistance/impaired     Grooming: Set up;Sitting;Brushing hair               Lower Body Dressing: Sitting/lateral leans;Sit to/from stand;Min guard;Set up;Supervision/safety Lower Body Dressing Details (indicate cue type and reason): pt able to thread pajama pants over B feet in sitting with set up A, Min guard for safety to hike over B hips in standing  Functional mobility during ADLs: Min guard (at room level, no AD)      Extremity/Trunk Assessment Upper Extremity Assessment Upper Extremity Assessment: Overall WFL for tasks assessed   Lower Extremity Assessment Lower Extremity Assessment: Generalized weakness         Vision Patient Visual Report: No change from baseline     Perception     Praxis      Cognition Arousal/Alertness: Awake/alert Behavior During Therapy: WFL for tasks assessed/performed Overall Cognitive Status: Within Functional Limits for tasks assessed                                          Exercises Other Exercises Other Exercises: Education provided re: energy conservation strategies with handout provided    Shoulder Instructions       General Comments      Pertinent Vitals/ Pain       Pain Assessment Pain Assessment: No/denies pain  Home Living                                          Prior Functioning/Environment              Frequency  Min 2X/week        Progress Toward Goals  OT Goals(current goals can now be found in the care plan section)  Progress towards OT goals: Progressing toward goals  Acute Rehab OT Goals Patient Stated Goal: return home OT Goal Formulation: With patient/family Time For Goal Achievement: 01/30/23 Potential to Achieve Goals: Good  Plan Discharge plan remains appropriate;Frequency remains appropriate    Co-evaluation                 AM-PAC OT "6 Clicks" Daily Activity     Outcome Measure   Help from another person eating meals?: None Help from another person taking care of personal grooming?: None Help from another person toileting, which includes using toliet, bedpan, or urinal?: A Little Help from another person bathing (including washing, rinsing, drying)?: A Little Help from another person to put on and taking off regular upper body clothing?: None Help from another person to put on and taking off regular lower body clothing?: A Little 6 Click Score: 21    End of Session    OT Visit Diagnosis: Unsteadiness on feet (R26.81);Muscle weakness (generalized) (M62.81)   Activity Tolerance Patient tolerated treatment well   Patient Left in chair;with call bell/phone  within reach;with chair alarm set   Nurse Communication Mobility status        Time: 3300-7622 OT Time Calculation (min): 14 min  Charges: OT General Charges $OT Visit: 1 Visit OT Treatments $Self Care/Home Management : 8-22 mins  [facility] MS, OTR/L ascom [phone]  01/17/23, 6:16 PM

## 2023-01-17 NOTE — Care Management Important Message (Signed)
Important Message  Patient Details  Name: Tara Marquez MRN: [id number] Date of Birth: 07-11-1941   Medicare Important Message Given:  Yes     Juliann Pulse A Javaun Dimperio 01/17/2023, 12:19 PM

## 2023-01-17 NOTE — Progress Notes (Signed)
[facility] Progress Note  Patient: Tara Marquez    WCH:[id number]  DOA: 01/13/2023     Date of Service: the patient was seen and examined on 01/17/2023  Chief Complaint  Patient presents with   Diarrhea   Brief hospital course: Tara Marquez is a 82 y.o. female with medical history significant of atrial fibrillation, iron deficiency anemia, chronic hematuria of unknown etiology, hypertension, hyperlipidemia, who presents to the ED due to diarrhea.  For past 5 days patient is feeling generalized weakness, nausea vomiting and lack of appetite.  2 days ago she developed diarrhea, having intermittent abdominal pain.  Decreased oral intake. ED workup.  Hypokalemia potassium 2.8, hyponatremia sodium 131, AKI creatinine 1.45, anemia Hb 9.9, UA positive for UTI, CT A/P:1. Obstructing 7 mm distal left ureteral calculus, with high-grade left-sided obstructive uropathy. Marked left renal edema, heterogeneous decreased enhancement, and perinephric fat stranding consistent with superimposed left-sided pyelonephritis. No renal abscess. 2. Punctate nonobstructing right renal calculus.  3. Stable 4.9 cm descending thoracic aortic aneurysm. This is incompletely evaluated on this study due to slice selection. 4.  Aortic Atherosclerosis (ICD10-I70.0).   Urology was consulted and patient was admitted for further management as below.  Assessment and Plan:  # Obstructive nephropathy CT imaging demonstrating a 7 mm distal left ureteral calculus with severe left-sided obstructive uropathy.  Due to evidence of infection, urology was consulted, s/p left ureteral stent placement with Dr. Bernardo Heater. Oxybutynin 5 mg every 8 as needed for stent discomfort. empiric antibiotics and following cultures.   She will require a total of 14 days of culture appropriate therapy. Outpatient left URS/LL/stent exchange in 2 to 3 weeks, we will call to arrange   Acute pyelonephritis CT imaging with evidence of pyelonephritis  in the setting of obstructive uropathy.  Given high risk for sepsis, will cover broadly with meropenem for now. S/p Meropenem, Urine culture growing Klebsiella pneumonia, sensitive to cephalosporin.  Started ceftriaxone 1 g IV daily, transition to ciprofloxacin founded twice daily for additional 10 days to complete 2 weeks course.   S/p IV fluids, continue oral hydration - Norco and Dilaudid for pain control - Zofran as needed for nausea, started PPI for possible GERD    AKI (acute kidney injury) ([place]) In the setting of obstructive uropathy and profound dehydration. Started IV fluids for gentle hydration - Avoid nephrotoxic agents - Monitor urine output Creatinine level 1.4-1.49-1.44 --1.26    # Hypokalemia, Secondary to poor p.o. intake over the last several days. Potassium repleted, monitor and replete as needed. # Hypophosphatemia, phosphorus repleted.  Continue nutritional support    # Isotonic hyponatremia secondary to diarrhea vs nutritional deficiency serum osmolality 281 within normal range Started sodium chloride 1 g p.o. 3 times daily Monitor sodium level daily   A-fib ([place]) - Held home Xarelto in the setting of ongoing hematuria with IDA - Continue home Amiodarone 1/19 resume Xarelto, patient was cleared by urology. Lopressor was held initially due to low blood pressure, resumed with holding parameters.  # Hypothyroid, continue Synthroid 75 mcg p.o. daily home dose # Depression, continue Zoloft home dose   Diarrhea Likely functional diarrhea in the setting of acute intra-abdominal infection.  No colitis noted on imaging. - Imodium as needed Started probiotics  Acute blood loss anemia due to ureteral stone and B12 deficiency Hb 11.94 months ago Hb 9.9 on admission, trended down to 7.5 today Monitor H&H and transfuse if hemoglobin less than 7  Vitamin B12 deficiency: Vitamin B12 level 62, goal >  400.  Started vitamin B12 1000 mcg IM injection daily during  hospital stay, followed by oral supplement.  Follow-up PCP to repeat vitamin B12 level after 3 to 6 months. Folic acid level within normal range  Vitamin D deficiency: started vitamin D 50,000 units p.o. weekly, follow with PCP to repeat vitamin D level after 3 to 6 months.  Body mass index is 24.36 kg/m.  Interventions:      Diet: Regular diet DVT Prophylaxis: Therapeutic Anticoagulation with Xarelto    Advance goals of care discussion: Full code  Family Communication: family was present at bedside, at the time of interview.  The pt provided permission to discuss medical plan with the family. Opportunity was given to ask question and all questions were answered satisfactorily.  1/22 discussed with patient's husband at bedside.  Disposition:  Pt is from Home, admitted with UTI and left ureteral stone status post stent insertion, urine culture growing Klebsiella, on IV antibiotics, low Hb and low K which precludes a safe discharge. Discharge to Home, when clinically stable, most likely tomorrow a.m.   Subjective: No significant events overnight, patient feels little bit generalized weakness and had nausea in the morning, no vomiting, no diarrhea, no significant abdominal pain.  Denies any other active issues.  Patient is ambulating well. Potassium is low which will be repleted, hemoglobin is low, we will monitor and repeat H&H tomorrow a.m., plan for disposition tomorrow a.m. if remains stable.   Physical Exam: General: NAD, lying comfortably Appear in no distress, affect appropriate Eyes: PERRLA ENT: Oral Mucosa Clear, moist  Neck: no JVD,  Cardiovascular: S1 and S2 Present, no Murmur,  Respiratory: good respiratory effort, Bilateral Air entry equal and Decreased, no Crackles, no wheezes Abdomen: Bowel Sound present, Soft and mild LLQ tenderness,  Skin: no rashes Extremities: no Pedal edema, no calf tenderness Neurologic: without any new focal findings Gait not checked due to  patient safety concerns  Vitals:   01/16/23 1636 01/16/23 2029 01/17/23 0433 01/17/23 0753  BP: 120/62 (!) 112/52 (!) 110/58 132/78  Pulse: (!) 104 (!) 108 (!) 103 (!) 108  Resp: '20 20 20 16  '$ Temp: (!) 97.5 F (36.4 C) 98.7 F (37.1 C) 98.9 F (37.2 C) 98.1 F (36.7 C)  TempSrc:  Oral Oral   SpO2: 96% 95% 96% 99%  Weight:      Height:        Intake/Output Summary (Last 24 hours) at 01/17/2023 1355 Last data filed at 01/17/2023 4818 Gross per 24 hour  Intake 868.3 ml  Output --  Net 868.3 ml    Filed Weights   01/13/23 1401  Weight: 66.4 kg    Data Reviewed: I have personally reviewed and interpreted daily labs, tele strips, imagings as discussed above. I reviewed all nursing notes, pharmacy notes, vitals, pertinent old records I have discussed plan of care as described above with RN and patient/family.  CBC: Recent Labs  Lab 01/13/23 1316 01/14/23 0327 01/15/23 0658 01/16/23 0326 01/17/23 0607  WBC 7.7 5.3 4.6 5.8 5.1  HGB 9.9* 7.8* 7.9* 8.1* 7.5*  HCT 31.2* 24.1* 25.4* 25.8* 23.8*  MCV 81.9 82.3 82.2 82.2 81.8  PLT 126* 90* 104* 145* 563   Basic Metabolic Panel: Recent Labs  Lab 01/13/23 1316 01/14/23 0327 01/14/23 1419 01/15/23 0658 01/16/23 0326 01/17/23 0607  NA 131* 131* 130* 131* 133* 136  K 2.8* 3.1* 3.5 3.3* 3.5 3.2*  CL 97* 101 99 100 103 106  CO2 '23 22 23 '$ 22  24 23  GLUCOSE 142* 101* 117* 99 104* 105*  BUN 28* 27* 28* 29* 26* 18  CREATININE 1.45* 1.40* 1.49* 1.44* 1.26* 1.21*  CALCIUM 7.9* 7.5* 7.9* 7.5* 7.4* 7.2*  MG 1.9 1.8  --  1.9 2.0 1.8  PHOS  --  2.3*  --  2.4* 2.6 2.6    Studies: No results found.  Scheduled Meds:  amiodarone  200 mg Oral Daily   atorvastatin  40 mg Oral q1800   cyanocobalamin  1,000 mcg Intramuscular Daily   Followed by   Derrill Memo ON 01/22/2023] vitamin B-12  1,000 mcg Oral Daily   feeding supplement  237 mL Oral TID BM   levothyroxine  75 mcg Oral Q0600   metoprolol succinate  25 mg Oral Daily    pantoprazole  40 mg Oral QAC breakfast   Rivaroxaban  15 mg Oral Q supper   saccharomyces boulardii  250 mg Oral BID   sertraline  150 mg Oral Daily   sodium chloride  1 g Oral TID WC   Vitamin D (Ergocalciferol)  50,000 Units Oral Q7 days   Continuous Infusions:  cefTRIAXone (ROCEPHIN)  IV 1 g (01/16/23 2038)   lactated ringers     PRN Meds: acetaminophen **OR** acetaminophen, guaiFENesin-dextromethorphan, HYDROcodone-acetaminophen, HYDROmorphone (DILAUDID) injection, loperamide, ondansetron **OR** ondansetron (ZOFRAN) IV, polyethylene glycol  Time spent: 50 minutes  Author: Val Riles. MD [facility] 01/17/2023 1:55 PM  To reach On-call, see care teams to locate the attending and reach out to them via [email or web address]. If 7PM-7AM, please contact night-coverage If you still have difficulty reaching the attending provider, please page the [facility] (Director on Call) for [facility] on amion for assistance.

## 2023-01-17 NOTE — TOC CM/SW Note (Signed)
  Transition of Care [facility]) Screening Note   Patient Details  Name: ZYLIAH SCHIER Date of Birth: 10/23/1941   Transition of Care [facility]) CM/SW Contact:    Gerilyn Pilgrim, LCSW Phone Number: 01/17/2023, 11:54 AM    Transition of Care Department [facility]) has reviewed patient and no TOC needs have been identified at this time. We will continue to monitor patient advancement through interdisciplinary progression rounds. If new patient transition needs arise, please place a TOC consult.

## 2023-01-18 ENCOUNTER — Inpatient Hospital Stay: Payer: Medicare Other

## 2023-01-18 DIAGNOSIS — N138 Other obstructive and reflux uropathy: Secondary | ICD-10-CM | POA: Diagnosis not present

## 2023-01-18 LAB — CBC
HCT: 26.1 % — ABNORMAL LOW (ref 36.0–46.0)
Hemoglobin: 8 g/dL — ABNORMAL LOW (ref 12.0–15.0)
MCH: 25.2 pg — ABNORMAL LOW (ref 26.0–34.0)
MCHC: 30.7 g/dL (ref 30.0–36.0)
MCV: 82.3 fL (ref 80.0–100.0)
Platelets: 225 10*3/uL (ref 150–400)
RBC: 3.17 MIL/uL — ABNORMAL LOW (ref 3.87–5.11)
RDW: 15.9 % — ABNORMAL HIGH (ref 11.5–15.5)
WBC: 6.2 10*3/uL (ref 4.0–10.5)
nRBC: 0 % (ref 0.0–0.2)

## 2023-01-18 LAB — BASIC METABOLIC PANEL
Anion gap: 7 (ref 5–15)
BUN: 14 mg/dL (ref 8–23)
CO2: 23 mmol/L (ref 22–32)
Calcium: 7.3 mg/dL — ABNORMAL LOW (ref 8.9–10.3)
Chloride: 106 mmol/L (ref 98–111)
Creatinine, Ser: 1.13 mg/dL — ABNORMAL HIGH (ref 0.44–1.00)
GFR, Estimated: 49 mL/min — ABNORMAL LOW (ref >60)
Glucose, Bld: 99 mg/dL (ref 70–99)
Potassium: 3.7 mmol/L (ref 3.5–5.1)
Sodium: 136 mmol/L (ref 135–145)

## 2023-01-18 LAB — MAGNESIUM: Magnesium: 1.8 mg/dL (ref 1.7–2.4)

## 2023-01-18 LAB — PHOSPHORUS: Phosphorus: 2.1 mg/dL — ABNORMAL LOW (ref 2.5–4.6)

## 2023-01-18 MED ORDER — MENTHOL 3 MG MT LOZG: 1.0000 | LOZENGE | OROMUCOSAL | Status: DC | PRN

## 2023-01-18 MED ORDER — POTASSIUM CHLORIDE CRYS ER 20 MEQ PO TBCR
40.0000 meq | EXTENDED_RELEASE_TABLET | Freq: Once | ORAL | Status: AC
  Administered 2023-01-18: 40 meq via ORAL
  Filled 2023-01-18: qty 2

## 2023-01-18 MED ORDER — SODIUM CHLORIDE 0.9 % IV SOLN
500.0000 mg | Freq: Every day | INTRAVENOUS | Status: AC
  Administered 2023-01-18 – 2023-01-19 (×2): 500 mg via INTRAVENOUS
  Filled 2023-01-18 (×2): qty 500

## 2023-01-18 MED ORDER — AZITHROMYCIN 500 MG PO TABS
500.0000 mg | ORAL_TABLET | Freq: Every day | ORAL | Status: AC
  Administered 2023-01-20 – 2023-01-22 (×3): 500 mg via ORAL
  Filled 2023-01-18 (×3): qty 1

## 2023-01-18 MED ORDER — IPRATROPIUM-ALBUTEROL 0.5-2.5 (3) MG/3ML IN SOLN
3.0000 mL | Freq: Four times a day (QID) | RESPIRATORY_TRACT | Status: DC | PRN
  Administered 2023-01-18 – 2023-01-20 (×3): 3 mL via RESPIRATORY_TRACT
  Filled 2023-01-18 (×3): qty 3

## 2023-01-18 MED ORDER — HYDROCOD POLI-CHLORPHE POLI ER 10-8 MG/5ML PO SUER
5.0000 mL | Freq: Two times a day (BID) | ORAL | Status: DC | PRN
  Administered 2023-01-18 – 2023-01-20 (×4): 5 mL via ORAL
  Filled 2023-01-18 (×4): qty 5

## 2023-01-18 MED ORDER — MAGNESIUM SULFATE 2 GM/50ML IV SOLN
2.0000 g | Freq: Once | INTRAVENOUS | Status: AC
  Administered 2023-01-18: 2 g via INTRAVENOUS
  Filled 2023-01-18: qty 50

## 2023-01-18 MED ORDER — GUAIFENESIN ER 600 MG PO TB12
600.0000 mg | ORAL_TABLET | Freq: Two times a day (BID) | ORAL | Status: DC
  Administered 2023-01-18 – 2023-01-22 (×9): 600 mg via ORAL
  Filled 2023-01-18 (×10): qty 1

## 2023-01-18 MED ORDER — POTASSIUM PHOSPHATES 15 MMOLE/5ML IV SOLN
30.0000 mmol | Freq: Once | INTRAVENOUS | Status: AC
  Administered 2023-01-18: 30 mmol via INTRAVENOUS
  Filled 2023-01-18: qty 10

## 2023-01-18 MED ORDER — BENZONATATE 100 MG PO CAPS
100.0000 mg | ORAL_CAPSULE | Freq: Three times a day (TID) | ORAL | Status: DC | PRN
  Administered 2023-01-20 – 2023-01-22 (×5): 100 mg via ORAL
  Filled 2023-01-18 (×5): qty 1

## 2023-01-18 NOTE — Progress Notes (Addendum)
[facility] Progress Note  Patient: Tara Marquez    QMV:[id number]  DOA: 01/13/2023     Date of Service: the patient was seen and examined on 01/18/2023  Chief Complaint  Patient presents with   Diarrhea   Brief hospital course: Tara Marquez is a 82 y.o. female with medical history significant of atrial fibrillation, iron deficiency anemia, chronic hematuria of unknown etiology, hypertension, hyperlipidemia, who presents to the ED due to diarrhea.  For past 5 days patient is feeling generalized weakness, nausea vomiting and lack of appetite.  2 days ago she developed diarrhea, having intermittent abdominal pain.  Decreased oral intake. ED workup.  Hypokalemia potassium 2.8, hyponatremia sodium 131, AKI creatinine 1.45, anemia Hb 9.9, UA positive for UTI, CT A/P:1. Obstructing 7 mm distal left ureteral calculus, with high-grade left-sided obstructive uropathy. Marked left renal edema, heterogeneous decreased enhancement, and perinephric fat stranding consistent with superimposed left-sided pyelonephritis. No renal abscess. 2. Punctate nonobstructing right renal calculus.  3. Stable 4.9 cm descending thoracic aortic aneurysm. This is incompletely evaluated on this study due to slice selection. 4.  Aortic Atherosclerosis (ICD10-I70.0).   Urology was consulted and patient was admitted for further management as below.  Assessment and Plan:  # Obstructive nephropathy CT imaging demonstrating a 7 mm distal left ureteral calculus with severe left-sided obstructive uropathy.  Due to evidence of infection, urology was consulted, s/p left ureteral stent placement with Dr. Bernardo Heater. Oxybutynin 5 mg every 8 as needed for stent discomfort. empiric antibiotics and following cultures.   She will require a total of 14 days of culture appropriate therapy. Outpatient left URS/LL/stent exchange in 2 to 3 weeks, we will call to arrange   Acute pyelonephritis CT imaging with evidence of pyelonephritis  in the setting of obstructive uropathy.  Given high risk for sepsis, will cover broadly with meropenem for now. S/p Meropenem, Urine culture growing Klebsiella pneumonia, sensitive to cephalosporin.  Started ceftriaxone 1 g IV daily, transition to ciprofloxacin founded twice daily for additional 10 days to complete 2 weeks course.   S/p IV fluids, continue oral hydration - Norco and Dilaudid for pain control - Zofran as needed for nausea, started PPI for possible GERD    AKI (acute kidney injury) ([place]) In the setting of obstructive uropathy and profound dehydration. Started IV fluids for gentle hydration - Avoid nephrotoxic agents - Monitor urine output Creatinine level 1.4-1.49-1.44 --1.26 --1.13   Community-acquired pneumonia Started azithromycin 40 mg daily for 5 days, patient is already on ceftriaxone. Started Mucinex 600 mg p.o. twice daily, Robitussin DM and Tussionex as needed for cough, Tessalon Perles and Cepacol lozenges as needed CXR:  Increased volume loss and opacity at the left lung base which could reflect atelectasis or developing infiltrate. Possible small left pleural effusion.  Known descending thoracic aortic aneurysm with dissection or penetrating ulcer could potentially be related. Consider follow-up chest CTA as clinically warranted. Clinically patient is stable, recommended to follow with PCP as an outpatient for CTA chest for descending thoracic aortic aneurysm and follow-up with vascular surgery as an outpatient.   # Hypokalemia, Secondary to poor p.o. intake over the last several days. Potassium repleted, monitor and replete as needed. # Hypophosphatemia, phosphorus repleted.  Continue nutritional support    # Isotonic hyponatremia secondary to diarrhea vs nutritional deficiency serum osmolality 281 within normal range Started sodium chloride 1 g p.o. 3 times daily Monitor sodium level daily   A-fib ([place]) - Held home Xarelto in the setting of ongoing  hematuria with IDA - Continue home Amiodarone 1/19 resume Xarelto, patient was cleared by urology. Lopressor was held initially due to low blood pressure, resumed with holding parameters.  # Hypothyroid, continue Synthroid 75 mcg p.o. daily home dose # Depression, continue Zoloft home dose   Diarrhea Likely functional diarrhea in the setting of acute intra-abdominal infection.  No colitis noted on imaging. - Imodium as needed Started probiotics  Acute blood loss anemia due to ureteral stone and B12 deficiency Hb 11.94 months ago Hb 9.9 on admission, trended down to 7.5 --8.0 today Monitor H&H and transfuse if hemoglobin less than 7  Vitamin B12 deficiency: Vitamin B12 level 62, goal >400.  Started vitamin B12 1000 mcg IM injection daily during hospital stay, followed by oral supplement.  Follow-up PCP to repeat vitamin B12 level after 3 to 6 months. Folic acid level within normal range  Vitamin D deficiency: started vitamin D 50,000 units p.o. weekly, follow with PCP to repeat vitamin D level after 3 to 6 months.  Body mass index is 24.36 kg/m.  Interventions:      Diet: Regular diet DVT Prophylaxis: Therapeutic Anticoagulation with Xarelto    Advance goals of care discussion: Full code  Family Communication: family was present at bedside, at the time of interview.  The pt provided permission to discuss medical plan with the family. Opportunity was given to ask question and all questions were answered satisfactorily.  1/22 discussed with patient's husband at bedside.  Disposition:  Pt is from Home, admitted with UTI and left ureteral stone status post stent insertion, urine culture growing Klebsiella, on IV antibiotics, low phosphorus, still having diarrhea and developed cough, which precludes a safe discharge. Discharge to Home, when clinically stable, most likely tomorrow a.m.   Subjective: No significant events overnight, patient is presently coughing, patient's husband  is concerned, wanted further workup, such as x-ray.  Patient also had a diarrhea 2 episodes today morning, we will replete electrolytes today and possible discharge tomorrow a.m. if remains stable.   Physical Exam: General: NAD, lying comfortably Appear in no distress, affect appropriate Eyes: PERRLA ENT: Oral Mucosa Clear, moist  Neck: no JVD,  Cardiovascular: S1 and S2 Present, no Murmur,  Respiratory: good respiratory effort, Bilateral Air entry equal and Decreased, no Crackles, no wheezes Abdomen: Bowel Sound present, Soft and mild LLQ tenderness,  Skin: no rashes Extremities: no Pedal edema, no calf tenderness Neurologic: without any new focal findings Gait not checked due to patient safety concerns  Vitals:   01/17/23 0753 01/17/23 1749 01/17/23 1933 01/18/23 0742  BP: 132/78 (!) 140/83 (!) 117/58 130/76  Pulse: (!) 108 80 98 96  Resp: '16 16 18 18  '$ Temp: 98.1 F (36.7 C) 98 F (36.7 C) 98.8 F (37.1 C) 98.8 F (37.1 C)  TempSrc:   Oral Oral  SpO2: 99% 100% 99% 99%  Weight:      Height:        Intake/Output Summary (Last 24 hours) at 01/18/2023 1439 Last data filed at 01/18/2023 0744 Gross per 24 hour  Intake --  Output 600 ml  Net -600 ml    Filed Weights   01/13/23 1401  Weight: 66.4 kg    Data Reviewed: I have personally reviewed and interpreted daily labs, tele strips, imagings as discussed above. I reviewed all nursing notes, pharmacy notes, vitals, pertinent old records I have discussed plan of care as described above with RN and patient/family.  CBC: Recent Labs  Lab 01/14/23 0327 01/15/23 9024  01/16/23 0326 01/17/23 0607 01/18/23 0613  WBC 5.3 4.6 5.8 5.1 6.2  HGB 7.8* 7.9* 8.1* 7.5* 8.0*  HCT 24.1* 25.4* 25.8* 23.8* 26.1*  MCV 82.3 82.2 82.2 81.8 82.3  PLT 90* 104* 145* 178 323   Basic Metabolic Panel: Recent Labs  Lab 01/14/23 0327 01/14/23 1419 01/15/23 0658 01/16/23 0326 01/17/23 0607 01/18/23 0613  NA 131* 130* 131* 133* 136  136  K 3.1* 3.5 3.3* 3.5 3.2* 3.7  CL 101 99 100 103 106 106  CO2 '22 23 22 24 23 23  '$ GLUCOSE 101* 117* 99 104* 105* 99  BUN 27* 28* 29* 26* 18 14  CREATININE 1.40* 1.49* 1.44* 1.26* 1.21* 1.13*  CALCIUM 7.5* 7.9* 7.5* 7.4* 7.2* 7.3*  MG 1.8  --  1.9 2.0 1.8 1.8  PHOS 2.3*  --  2.4* 2.6 2.6 2.1*    Studies: DG Chest Port 1 View  Result Date: 01/18/2023 CLINICAL DATA:  Shortness of breath. EXAM: PORTABLE CHEST 1 VIEW COMPARISON:  Radiographs 10/22/2018 and 10/20/2018. Chest CT 09/29/2022 and abdominal CT 01/13/2023 FINDINGS: 1038 hours. The heart size and mediastinal contours are grossly stable with aortic atherosclerosis. Patient has a known aneurysm of the descending aorta with associated penetrating ulcer or short segment dissection. Compared with the recent CT exams, there is increased volume loss and opacity at the left lung base which could reflect atelectasis or developing infiltrate. The right lung remains clear. There may be a small left pleural effusion. No pneumothorax or acute osseous abnormality identified. IMPRESSION: 1. Increased volume loss and opacity at the left lung base which could reflect atelectasis or developing infiltrate. Possible small left pleural effusion. 2. Known descending thoracic aortic aneurysm with dissection or penetrating ulcer could potentially be related. Consider follow-up chest CTA as clinically warranted. Electronically Signed   By: Richardean Sale M.D.   On: 01/18/2023 10:49    Scheduled Meds:  amiodarone  200 mg Oral Daily   atorvastatin  40 mg Oral q1800   cyanocobalamin  1,000 mcg Intramuscular Daily   Followed by   Derrill Memo ON 01/22/2023] vitamin B-12  1,000 mcg Oral Daily   feeding supplement  237 mL Oral TID BM   guaiFENesin  600 mg Oral BID   levothyroxine  75 mcg Oral Q0600   metoprolol succinate  25 mg Oral Daily   pantoprazole  40 mg Oral QAC breakfast   Rivaroxaban  15 mg Oral Q supper   saccharomyces boulardii  250 mg Oral BID    sertraline  150 mg Oral Daily   sodium chloride  1 g Oral TID WC   Vitamin D (Ergocalciferol)  50,000 Units Oral Q7 days   Continuous Infusions:  cefTRIAXone (ROCEPHIN)  IV 1 g (01/17/23 2011)   lactated ringers     potassium PHOSPHATE IVPB (in mmol) 30 mmol (01/18/23 1226)   PRN Meds: acetaminophen **OR** acetaminophen, chlorpheniramine-HYDROcodone, guaiFENesin-dextromethorphan, HYDROcodone-acetaminophen, HYDROmorphone (DILAUDID) injection, ipratropium-albuterol, loperamide, ondansetron **OR** ondansetron (ZOFRAN) IV, polyethylene glycol  Time spent: 50 minutes  Author: Val Riles. MD [facility] 01/18/2023 2:39 PM  To reach On-call, see care teams to locate the attending and reach out to them via [email or web address]. If 7PM-7AM, please contact night-coverage If you still have difficulty reaching the attending provider, please page the [facility] (Director on Call) for [facility] on amion for assistance.

## 2023-01-18 NOTE — Evaluation (Addendum)
Clinical/Bedside Swallow Evaluation Patient Details  Name: Tara Marquez MRN: [id number] Date of Birth: 1941/04/13  Today's Date: 01/18/2023 Time: SLP Start Time (ACUTE ONLY): 1100 SLP Stop Time (ACUTE ONLY): 1200 SLP Time Calculation (min) (ACUTE ONLY): 60 min  Past Medical History:  Past Medical History:  Diagnosis Date   A-fib ([place])    Cancer ([place])    skin ca   Depression    mild major depression   GERD (gastroesophageal reflux disease)    Hyperlipidemia    Hypertension    Hypothyroidism    Past Surgical History:  Past Surgical History:  Procedure Laterality Date   ABDOMINAL HYSTERECTOMY  03/1971   Status Post Hysterectomy   CATARACT EXTRACTION Left    COLONOSCOPY     2010   COLONOSCOPY WITH PROPOFOL N/A 01/12/2022   Procedure: COLONOSCOPY WITH PROPOFOL;  Surgeon: Lucilla Lame, MD;  Location: [facility] ENDOSCOPY;  Service: Endoscopy;  Laterality: N/A;   CYSTOSCOPY WITH RETROGRADE PYELOGRAM, URETEROSCOPY AND STENT PLACEMENT Left 01/13/2023   Procedure: CYSTOSCOPY WITH RETROGRADE PYELOGRAM, URETEROSCOPY AND STENT PLACEMENT;  Surgeon: Abbie Sons, MD;  Location: [facility] ORS;  Service: Urology;  Laterality: Left;   ESOPHAGOGASTRODUODENOSCOPY N/A 11/02/2022   Procedure: ESOPHAGOGASTRODUODENOSCOPY (EGD);  Surgeon: Lucilla Lame, MD;  Location: [facility] ENDOSCOPY;  Service: Endoscopy;  Laterality: N/A;   EYE SURGERY     GIVENS CAPSULE STUDY N/A 12/22/2022   Procedure: GIVENS CAPSULE STUDY;  Surgeon: Lucilla Lame, MD;  Location: [facility] ENDOSCOPY;  Service: Endoscopy;  Laterality: N/A;   TONSILLECTOMY     TONSILLECTOMY AND ADENOIDECTOMY  1951   WISDOM TOOTH EXTRACTION  1970   HPI:  Per H&P, pt is a 82 y.o. female with medical history significant of atrial fibrillation, iron deficiency anemia, chronic hematuria of unknown etiology, hypertension, hyperlipidemia, who presents to the ED due to diarrhea. Pt states that for the last 5 days, she has been experiencing profound generalized malaise,  nausea, vomiting, and lack of appetite. Then 2 days ago, she noticed diarrhea that was nonbloody, nonmelanotic.  In addition, she endorses intermittent abdominal pain.  She has not had much to eat or drink in the last 5 days due to the symptoms.  She denies any fever, chills.  She states that she was told over the last couple months that she has had small kidney stone seen on imaging, but she denies any history of kidney stones prior to that.    Assessment / Plan / Recommendation  Clinical Impression  Pt seen today for BSE. Pt appeared alert and cooperative throughout eval. Pt's husband present for duration of eval. Pt and husband reported coughing w/ breakfast this morning. Pt also reported that coughing started two days ago since pt has been more bedridden. He also reported that coughing was not in the presence of po's. Pt left sitting up in bed with husband in room, call button within reach, and bed alarm set.   Pt on room air, afebrile, WBC WFL.   Pt does not appear to present with oropharyngeal phase dysphagia - oropharyngeal swallowing appeared functional. Oral mech exam completed and WFL. Trials of ice chips (x6), thin liquids (~4 oz water and gingerale), and solids (1.5 graham cracker dipped in apple sauce) were given. During oral phase, pt demonstrated no overt s/s of oral dysphagia c/b appropriate and timely mastication, timely bolus A/P transit, and clear oral cavity post-swallow. During pharyngeal phase, pt demonstrated seemingly timely swallow, seemingly adequate hyolaryngeal elevation, and clear vocal quality post-swallows of all trials except initial  ice chip. Noted "croupy" cough (x1) in between trials of ice chips. Cough was not present during presentation of any other trials.  SLP provided pt and family education on aspiration precautions including small sips/bites, breaks between sips/bites, and eating slowly.   Husband reported hx of pt vomitting after swallowing pills. Conversed with  RN to provide education on swallowing pills in puree to address this issue. Pt and husband agreed. Pt appears to have a history of esophageal phase dysmotility as evidenced by EGD 10/2022: Hiatal hernia. General reflux precautions given.  Recommend continue regular diet (w/ meats cut and moistened foods in setting of esophageal phase dysmotility) w/ thin liquids via cup - pt reported she does not use straws. Recommend swallowing pills whole w/ puree for ease of clearing (see above). General aspiration precaution to include sitting upright, small bites/sip, eating/drinking slowly.  SLP Visit Diagnosis: Dysphagia, unspecified (R13.10)    Aspiration Risk  Mild aspiration risk (d/t esophageal-related issues)    Diet Recommendation  Recommend continue regular diet (w/ meats cut and moistened foods in setting of esophageal phase dysmotility) w/ thin liquids via cup - pt reported she does not use straws. General aspiration precaution to include sitting upright, small bites/sip, eating/drinking slowly. Reflux precautions.  Medication Administration: Whole meds with puree    Other  Recommendations Oral Care Recommendations: Oral care BID;Oral care before and after PO    Recommendations for follow up therapy are one component of a multi-disciplinary discharge planning process, led by the attending physician.  Recommendations may be updated based on patient status, additional functional criteria and insurance authorization.  Follow up Recommendations No SLP follow up      Assistance Recommended at Discharge  PRN  Functional Status Assessment Patient has had a recent decline in their functional status and/or demonstrates limited ability to make significant improvements in function in a reasonable and predictable amount of time  Frequency and Duration     N/A       Prognosis Prognosis for Safe Diet Advancement: Good Barriers to Reach Goals: Severity of deficits (Esophageal-relates issues)       Swallow Study   General Date of Onset: 01/13/23 HPI: Per H&P, pt is a 82 y.o. female with medical history significant of atrial fibrillation, iron deficiency anemia, chronic hematuria of unknown etiology, hypertension, hyperlipidemia, who presents to the ED due to diarrhea. Pt states that for the last 5 days, she has been experiencing profound generalized malaise, nausea, vomiting, and lack of appetite. Then 2 days ago, she noticed diarrhea that was nonbloody, nonmelanotic.  In addition, she endorses intermittent abdominal pain.  She has not had much to eat or drink in the last 5 days due to the symptoms.  She denies any fever, chills.  She states that she was told over the last couple months that she has had small kidney stone seen on imaging, but she denies any history of kidney stones prior to that. Pt also has history of esophageal issues including hiatal hernia and c/o esophageal phase dismobility. Type of Study: Bedside Swallow Evaluation Previous Swallow Assessment: N/A Diet Prior to this Study: Regular;Thin liquids Temperature Spikes Noted: Yes (WBC 6.2) Respiratory Status: Room air Behavior/Cognition: Alert;Cooperative;Pleasant mood Oral Cavity Assessment: Within Functional Limits Oral Care Completed by SLP: No Oral Cavity - Dentition: Adequate natural dentition Vision: Functional for self-feeding Self-Feeding Abilities: Able to feed self;Needs set up Patient Positioning: Upright in bed Baseline Vocal Quality: Normal Volitional Swallow: Able to elicit    Oral/Motor/Sensory Function Overall Oral  Motor/Sensory Function: Within functional limits   Ice Chips Ice chips: Within functional limits Presentation: Spoon (x6, "croupy" cough between trials)   Thin Liquid Thin Liquid: Within functional limits Presentation: Cup;Self Fed (~4 oz)    Nectar Thick Nectar Thick Liquid: Not tested   Honey Thick Honey Thick Liquid: Not tested   Puree Puree: Within functional limits Presentation:  Self Fed;Spoon (~1 oz)   Solid     Solid: Within functional limits Presentation: Self Fed (x1.5 graham cracker)      Randall Hiss Graduate Clinician [place], Speech Pathology   Randall Hiss 01/18/2023,1:29 PM   ADDENDUM: The information in this patient note, response to treatment, and overall treatment plan developed has been reviewed and agreed upon after reviewing documentation. This session was performed under the supervision of this licensed clinician.   Orinda Kenner, MS, CCC-SLP Speech Language Pathologist Rehab Services; [place] [phone] (ascom) 01/18/2023, 1652PM

## 2023-01-19 DIAGNOSIS — N138 Other obstructive and reflux uropathy: Secondary | ICD-10-CM | POA: Diagnosis not present

## 2023-01-19 LAB — CBC
HCT: 26.5 % — ABNORMAL LOW (ref 36.0–46.0)
Hemoglobin: 8.2 g/dL — ABNORMAL LOW (ref 12.0–15.0)
MCH: 25.5 pg — ABNORMAL LOW (ref 26.0–34.0)
MCHC: 30.9 g/dL (ref 30.0–36.0)
MCV: 82.6 fL (ref 80.0–100.0)
Platelets: 230 10*3/uL (ref 150–400)
RBC: 3.21 MIL/uL — ABNORMAL LOW (ref 3.87–5.11)
RDW: 16.3 % — ABNORMAL HIGH (ref 11.5–15.5)
WBC: 7.8 10*3/uL (ref 4.0–10.5)
nRBC: 0 % (ref 0.0–0.2)

## 2023-01-19 LAB — BASIC METABOLIC PANEL
Anion gap: 7 (ref 5–15)
BUN: 13 mg/dL (ref 8–23)
CO2: 23 mmol/L (ref 22–32)
Calcium: 7.4 mg/dL — ABNORMAL LOW (ref 8.9–10.3)
Chloride: 106 mmol/L (ref 98–111)
Creatinine, Ser: 1.2 mg/dL — ABNORMAL HIGH (ref 0.44–1.00)
GFR, Estimated: 46 mL/min — ABNORMAL LOW (ref >60)
Glucose, Bld: 109 mg/dL — ABNORMAL HIGH (ref 70–99)
Potassium: 4.7 mmol/L (ref 3.5–5.1)
Sodium: 136 mmol/L (ref 135–145)

## 2023-01-19 LAB — MAGNESIUM: Magnesium: 1.9 mg/dL (ref 1.7–2.4)

## 2023-01-19 LAB — PHOSPHORUS: Phosphorus: 3 mg/dL (ref 2.5–4.6)

## 2023-01-19 MED ORDER — ZINC OXIDE 40 % EX OINT
TOPICAL_OINTMENT | Freq: Four times a day (QID) | CUTANEOUS | Status: DC | PRN
  Filled 2023-01-19: qty 113

## 2023-01-19 NOTE — Anesthesia Postprocedure Evaluation (Signed)
Anesthesia Post Note  Patient: ASJAH RAUDA  Procedure(s) Performed: CYSTOSCOPY WITH RETROGRADE PYELOGRAM, URETEROSCOPY AND STENT PLACEMENT (Left)  Patient location during evaluation: PACU Anesthesia Type: General Level of consciousness: awake and alert Pain management: pain level controlled Vital Signs Assessment: post-procedure vital signs reviewed and stable Respiratory status: spontaneous breathing, nonlabored ventilation, respiratory function stable and patient connected to nasal cannula oxygen Cardiovascular status: blood pressure returned to baseline and stable Postop Assessment: no apparent nausea or vomiting Anesthetic complications: no   No notable events documented.   Last Vitals:  Vitals:   01/19/23 0439 01/19/23 0823  BP: 138/89 124/70  Pulse: (!) 102 93  Resp: 19 18  Temp: 37 C 36.9 C  SpO2: 99% 97%    Last Pain:  Vitals:   01/19/23 0843  TempSrc:   PainSc: 0-No pain                 Molli Barrows

## 2023-01-19 NOTE — Progress Notes (Signed)
[facility] Progress Note  Patient: Tara Marquez    TDD:[id number]  DOA: 01/13/2023     Date of Service: the patient was seen and examined on 01/19/2023  Chief Complaint  Patient presents with   Diarrhea   Brief hospital course: Tara Marquez is a 82 y.o. female with medical history significant of atrial fibrillation, iron deficiency anemia, chronic hematuria of unknown etiology, hypertension, hyperlipidemia, who presents to the ED due to diarrhea.  For past 5 days patient is feeling generalized weakness, nausea vomiting and lack of appetite.  2 days ago she developed diarrhea, having intermittent abdominal pain.  Decreased oral intake. ED workup.  Hypokalemia potassium 2.8, hyponatremia sodium 131, AKI creatinine 1.45, anemia Hb 9.9, UA positive for UTI, CT A/P:1. Obstructing 7 mm distal left ureteral calculus, with high-grade left-sided obstructive uropathy. Marked left renal edema, heterogeneous decreased enhancement, and perinephric fat stranding consistent with superimposed left-sided pyelonephritis. No renal abscess. 2. Punctate nonobstructing right renal calculus.  3. Stable 4.9 cm descending thoracic aortic aneurysm. This is incompletely evaluated on this study due to slice selection. 4.  Aortic Atherosclerosis (ICD10-I70.0).   Urology was consulted and patient was admitted for further management as below.  Assessment and Plan:  # Obstructive nephropathy CT imaging demonstrating a 7 mm distal left ureteral calculus with severe left-sided obstructive uropathy.  Due to evidence of infection, urology was consulted, s/p left ureteral stent placement with Dr. Bernardo Marquez. Oxybutynin 5 mg every 8 as needed for stent discomfort. empiric antibiotics and following cultures.   She will require a total of 14 days of culture appropriate therapy. Outpatient left URS/LL/stent exchange in 2 to 3 weeks, we will call to arrange   Acute pyelonephritis CT imaging with evidence of pyelonephritis  in the setting of obstructive uropathy.  Given high risk for sepsis, will cover broadly with meropenem for now. S/p Meropenem, Urine culture growing Klebsiella pneumonia, sensitive to cephalosporin.  Started ceftriaxone 1 g IV daily, transition to ciprofloxacin founded twice daily for additional 10 days to complete 2 weeks course.   S/p IV fluids, continue oral hydration - Norco and Dilaudid for pain control - Zofran as needed for nausea, started PPI for possible GERD    AKI (acute kidney injury) ([place]) In the setting of obstructive uropathy and profound dehydration. Started IV fluids for gentle hydration - Avoid nephrotoxic agents - Monitor urine output Creatinine level 1.4-1.49-1.44 ---1.2   Community-acquired pneumonia Started azithromycin 40 mg daily for 5 days, patient is already on ceftriaxone. Started Mucinex 600 mg p.o. twice daily, Robitussin DM and Tussionex as needed for cough, Tessalon Perles and Cepacol lozenges as needed CXR:  Increased volume loss and opacity at the left lung base which could reflect atelectasis or developing infiltrate. Possible small left pleural effusion.  Known descending thoracic aortic aneurysm with dissection or penetrating ulcer could potentially be related. Consider follow-up chest CTA as clinically warranted. Clinically patient is stable, recommended to follow with PCP as an outpatient for CTA chest for descending thoracic aortic aneurysm and follow-up with vascular surgery as an outpatient.   # Hypokalemia, Secondary to poor p.o. intake over the last several days. Potassium repleted, monitor and replete as needed. # Hypophosphatemia, phosphorus repleted.  Continue nutritional support    # Isotonic hyponatremia secondary to diarrhea vs nutritional deficiency serum osmolality 281 within normal range Started sodium chloride 1 g p.o. 3 times daily Monitor sodium level daily   A-fib ([place]) - Held home Xarelto in the setting of ongoing hematuria  with IDA - Continue home Amiodarone 1/19 resume Xarelto, patient was cleared by urology. Lopressor was held initially due to low blood pressure, resumed with holding parameters.  # Hypothyroid, continue Synthroid 75 mcg p.o. daily home dose # Depression, continue Zoloft home dose   Diarrhea Likely functional diarrhea in the setting of acute intra-abdominal infection.  No colitis noted on imaging. - Imodium as needed Started probiotics  Acute blood loss anemia due to ureteral stone and B12 deficiency Hb 11.94 months ago Hb 9.9 on admission, trended down to 7.5 --8.0 --8.2 today Monitor H&H and transfuse if hemoglobin less than 7  Vitamin B12 deficiency: Vitamin B12 level 62, goal >400.  Started vitamin B12 1000 mcg IM injection daily during hospital stay, followed by oral supplement.  Follow-up PCP to repeat vitamin B12 level after 3 to 6 months. Folic acid level within normal range  Vitamin D deficiency: started vitamin D 50,000 units p.o. weekly, follow with PCP to repeat vitamin D level after 3 to 6 months.  Body mass index is 24.36 kg/m.  Interventions:      Diet: Regular diet DVT Prophylaxis: Therapeutic Anticoagulation with Xarelto    Advance goals of care discussion: Full code  Family Communication: family was present at bedside, at the time of interview.  The pt provided permission to discuss medical plan with the family. Opportunity was given to ask question and all questions were answered satisfactorily.  1/22 discussed with patient's husband at bedside.  Disposition:  Pt is from Home, admitted with UTI and left ureteral stone status post stent insertion, urine culture growing Klebsiella, on IV antibiotics, still having diarrhea, N/V, low appetite and cough, which precludes a safe discharge. Discharge to Home, when clinically stable, most likely tomorrow a.m.   Subjective: No significant events overnight, in the morning time patient was feeling nauseous and vomited  all her morning meds, also had an episode of diarrhea.  Patient does not feel good is still having cough and chest congestion so does not feel stable to be discharged. Patient's husband was at bedside, management plan discussed.  We will continue to monitor today and plan for discharge tomorrow a.m.   Physical Exam: General: NAD, lying comfortably Appear in no distress, affect appropriate Eyes: PERRLA ENT: Oral Mucosa Clear, moist  Neck: no JVD,  Cardiovascular: S1 and S2 Present, no Murmur,  Respiratory: good respiratory effort, Bilateral Air entry equal and Decreased, no Crackles, no wheezes Abdomen: Bowel Sound present, Soft and mild LLQ tenderness,  Skin: no rashes Extremities: no Pedal edema, no calf tenderness Neurologic: without any new focal findings Gait not checked due to patient safety concerns  Vitals:   01/18/23 1822 01/18/23 2122 01/19/23 0439 01/19/23 0823  BP: (!) 146/80 132/80 138/89 124/70  Pulse: 90 (!) 105 (!) 102 93  Resp: '20 20 19 18  '$ Temp: (!) 97.5 F (36.4 C) 100 F (37.8 C) 98.6 F (37 C) 98.4 F (36.9 C)  TempSrc:   Oral   SpO2: 99% 95% 99% 97%  Weight:      Height:        Intake/Output Summary (Last 24 hours) at 01/19/2023 1516 Last data filed at 01/19/2023 0500 Gross per 24 hour  Intake 1154.63 ml  Output --  Net 1154.63 ml    Filed Weights   01/13/23 1401  Weight: 66.4 kg    Data Reviewed: I have personally reviewed and interpreted daily labs, tele strips, imagings as discussed above. I reviewed all nursing notes, pharmacy notes, vitals,  pertinent old records I have discussed plan of care as described above with RN and patient/family.  CBC: Recent Labs  Lab 01/15/23 0658 01/16/23 0326 01/17/23 0607 01/18/23 0613 01/19/23 0518  WBC 4.6 5.8 5.1 6.2 7.8  HGB 7.9* 8.1* 7.5* 8.0* 8.2*  HCT 25.4* 25.8* 23.8* 26.1* 26.5*  MCV 82.2 82.2 81.8 82.3 82.6  PLT 104* 145* 178 225 970   Basic Metabolic Panel: Recent Labs  Lab  01/15/23 0658 01/16/23 0326 01/17/23 0607 01/18/23 0613 01/19/23 0518  NA 131* 133* 136 136 136  K 3.3* 3.5 3.2* 3.7 4.7  CL 100 103 106 106 106  CO2 '22 24 23 23 23  '$ GLUCOSE 99 104* 105* 99 109*  BUN 29* 26* '18 14 13  '$ CREATININE 1.44* 1.26* 1.21* 1.13* 1.20*  CALCIUM 7.5* 7.4* 7.2* 7.3* 7.4*  MG 1.9 2.0 1.8 1.8 1.9  PHOS 2.4* 2.6 2.6 2.1* 3.0    Studies: No results found.  Scheduled Meds:  amiodarone  200 mg Oral Daily   atorvastatin  40 mg Oral q1800   [START ON 01/20/2023] azithromycin  500 mg Oral Daily   cyanocobalamin  1,000 mcg Intramuscular Daily   Followed by   Derrill Memo ON 01/22/2023] vitamin B-12  1,000 mcg Oral Daily   feeding supplement  237 mL Oral TID BM   guaiFENesin  600 mg Oral BID   levothyroxine  75 mcg Oral Q0600   metoprolol succinate  25 mg Oral Daily   pantoprazole  40 mg Oral QAC breakfast   Rivaroxaban  15 mg Oral Q supper   saccharomyces boulardii  250 mg Oral BID   sertraline  150 mg Oral Daily   sodium chloride  1 g Oral TID WC   Vitamin D (Ergocalciferol)  50,000 Units Oral Q7 days   Continuous Infusions:  cefTRIAXone (ROCEPHIN)  IV 1 g (01/18/23 2334)   lactated ringers     PRN Meds: acetaminophen **OR** acetaminophen, benzonatate, chlorpheniramine-HYDROcodone, guaiFENesin-dextromethorphan, HYDROcodone-acetaminophen, HYDROmorphone (DILAUDID) injection, ipratropium-albuterol, liver oil-zinc oxide, loperamide, menthol-cetylpyridinium, ondansetron **OR** ondansetron (ZOFRAN) IV, polyethylene glycol  Time spent: 50 minutes  Author: Val Riles. MD [facility] 01/19/2023 3:16 PM  To reach On-call, see care teams to locate the attending and reach out to them via [email or web address]. If 7PM-7AM, please contact night-coverage If you still have difficulty reaching the attending provider, please page the [facility] (Director on Call) for [facility] on amion for assistance.

## 2023-01-19 NOTE — Progress Notes (Signed)
PT Cancellation Note  Patient Details Name: Tara Marquez MRN: [id number] DOB: Dec 08, 1941   Cancelled Treatment:    Reason Eval/Treat Not Completed:  (Treatment session attempted. Upon arrival to room, patient resting with dimmed lights, emesis bag on chest.  Endorses recent vomiting (with persistent nausea) and requests therapist re-attempt later.  RN informed/aware and meds requested as available.  Will continue efforts as appropriate.)   Chan Rosasco H. Owens Shark, PT, DPT, NCS 01/19/23, 9:52 AM [phone]

## 2023-01-20 ENCOUNTER — Inpatient Hospital Stay: Payer: Medicare Other | Admitting: Internal Medicine

## 2023-01-20 ENCOUNTER — Inpatient Hospital Stay: Payer: Medicare Other

## 2023-01-20 DIAGNOSIS — N138 Other obstructive and reflux uropathy: Secondary | ICD-10-CM | POA: Diagnosis not present

## 2023-01-20 LAB — CBC
HCT: 26.4 % — ABNORMAL LOW (ref 36.0–46.0)
Hemoglobin: 8.1 g/dL — ABNORMAL LOW (ref 12.0–15.0)
MCH: 25.4 pg — ABNORMAL LOW (ref 26.0–34.0)
MCHC: 30.7 g/dL (ref 30.0–36.0)
MCV: 82.8 fL (ref 80.0–100.0)
Platelets: 226 10*3/uL (ref 150–400)
RBC: 3.19 MIL/uL — ABNORMAL LOW (ref 3.87–5.11)
RDW: 16.2 % — ABNORMAL HIGH (ref 11.5–15.5)
WBC: 8.9 10*3/uL (ref 4.0–10.5)
nRBC: 0 % (ref 0.0–0.2)

## 2023-01-20 LAB — BASIC METABOLIC PANEL
Anion gap: 5 (ref 5–15)
BUN: 13 mg/dL (ref 8–23)
CO2: 25 mmol/L (ref 22–32)
Calcium: 7.6 mg/dL — ABNORMAL LOW (ref 8.9–10.3)
Chloride: 105 mmol/L (ref 98–111)
Creatinine, Ser: 1.21 mg/dL — ABNORMAL HIGH (ref 0.44–1.00)
GFR, Estimated: 45 mL/min — ABNORMAL LOW (ref >60)
Glucose, Bld: 105 mg/dL — ABNORMAL HIGH (ref 70–99)
Potassium: 4.4 mmol/L (ref 3.5–5.1)
Sodium: 135 mmol/L (ref 135–145)

## 2023-01-20 LAB — MAGNESIUM: Magnesium: 1.8 mg/dL (ref 1.7–2.4)

## 2023-01-20 LAB — PHOSPHORUS: Phosphorus: 3.5 mg/dL (ref 2.5–4.6)

## 2023-01-20 NOTE — Care Management Important Message (Signed)
Important Message  Patient Details  Name: Tara Marquez MRN: [id number] Date of Birth: 1941/07/09   Medicare Important Message Given:  Yes     Juliann Pulse A Francois Elk 01/20/2023, 1:59 PM

## 2023-01-20 NOTE — Progress Notes (Signed)
Occupational Therapy Treatment Patient Details Name: Tara Marquez MRN: [id number] DOB: [birth year] Today's Date: 01/20/2023   History of present illness Pt is an 82 y/o F admitted on 01/13/23 after presenting to the ED with c/o diarrhea. CT imaging demonstrating a 7 mm distal left ureteral calculus with severe left-sided obstructive uropathy & with evidence of pyelonephritis in the setting of obstructive uropathy. Pt is s/p L ureteral stent placement. PMH: a-fib, iron deficiency anemia, chronic hematuria of unknown etiology, HTN, HLD   OT comments  Upon entering the room, pt supine in bed with no c/o pain and agreeable to OT intervention. Family present in room. Pt requesting assistance to ambulate to bathroom. Pt stands with min guard and ambulates with min HHA into bathroom. Pt able to void and donning brief and pants while seated on commode with min A to thread onto B feet. Pt stands with min guard to pull pants over B hips. Pt stands at sink for hand hygiene before returning to recliner chair in same manner. Pt appears very fatigued and OT discussed use of RW at home for energy conservation and in safety. Pt making progress towards goals with recommendation for HHOT at hospital discharge.    Recommendations for follow up therapy are one component of a multi-disciplinary discharge planning process, led by the attending physician.  Recommendations may be updated based on patient status, additional functional criteria and insurance authorization.    Follow Up Recommendations  Home health OT     Assistance Recommended at Discharge Intermittent Supervision/Assistance  Patient can return home with the following  Assistance with cooking/housework;Assist for transportation;Help with stairs or ramp for entrance;A little help with walking and/or transfers;A little help with bathing/dressing/bathroom   Equipment Recommendations  Other (comment) (RW)       Precautions / Restrictions  Precautions Precautions: Fall Restrictions Weight Bearing Restrictions: No       Mobility Bed Mobility Overal bed mobility: Modified Independent                  Transfers Overall transfer level: Needs assistance Equipment used: 1 person hand held assist Transfers: Sit to/from Stand Sit to Stand: Min guard, Min assist                 Balance Overall balance assessment: Needs assistance Sitting-balance support: Feet supported, No upper extremity supported Sitting balance-Leahy Scale: Good     Standing balance support: During functional activity, No upper extremity supported Standing balance-Leahy Scale: Fair                             ADL either performed or assessed with clinical judgement   ADL Overall ADL's : Needs assistance/impaired     Grooming: Standing;Wash/dry hands;Supervision/safety           Upper Body Dressing : Supervision/safety;Set up;Sitting   Lower Body Dressing: Sitting/lateral leans;Sit to/from stand;Minimal assistance   Toilet Transfer: Minimal assistance   Toileting- Clothing Manipulation and Hygiene: Min guard;Sit to/from stand              Extremity/Trunk Assessment Upper Extremity Assessment Upper Extremity Assessment: Generalized weakness   Lower Extremity Assessment Lower Extremity Assessment: Generalized weakness        Vision Patient Visual Report: No change from baseline            Cognition Arousal/Alertness: Awake/alert Behavior During Therapy: WFL for tasks assessed/performed Overall Cognitive Status: Within Functional Limits for tasks assessed  Pertinent Vitals/ Pain       Pain Assessment Pain Assessment: No/denies pain         Frequency  Min 2X/week        Progress Toward Goals  OT Goals(current goals can now be found in the care plan section)  Progress towards OT goals: Progressing toward  goals  Acute Rehab OT Goals Patient Stated Goal: to go home OT Goal Formulation: With patient/family Time For Goal Achievement: 01/30/23 Potential to Achieve Goals: Good  Plan Frequency remains appropriate;Discharge plan needs to be updated       AM-PAC OT "6 Clicks" Daily Activity     Outcome Measure   Help from another person eating meals?: None Help from another person taking care of personal grooming?: None Help from another person toileting, which includes using toliet, bedpan, or urinal?: A Little Help from another person bathing (including washing, rinsing, drying)?: A Little Help from another person to put on and taking off regular upper body clothing?: None Help from another person to put on and taking off regular lower body clothing?: A Little 6 Click Score: 21    End of Session    OT Visit Diagnosis: Unsteadiness on feet (R26.81);Muscle weakness (generalized) (M62.81)   Activity Tolerance Patient limited by fatigue   Patient Left in chair;with call bell/phone within reach;with chair alarm set;with family/visitor present   Nurse Communication Mobility status        Time: 1000-1013 OT Time Calculation (min): 13 min  Charges: OT General Charges $OT Visit: 1 Visit OT Treatments $Self Care/Home Management : 8-22 mins  Darleen Crocker, MS, OTR/L , CBIS ascom [phone]  01/20/23, 11:03 AM

## 2023-01-20 NOTE — Progress Notes (Signed)
[facility] Progress Note  Patient: Tara Marquez    OIN:[id number]  DOA: 01/13/2023     Date of Service: the patient was seen and examined on 01/20/2023  Chief Complaint  Patient presents with   Diarrhea   Brief hospital course: Tara Marquez is a 82 y.o. female with medical history significant of atrial fibrillation, iron deficiency anemia, chronic hematuria of unknown etiology, hypertension, hyperlipidemia, who presents to the ED due to diarrhea.  For past 5 days patient is feeling generalized weakness, nausea vomiting and lack of appetite.  2 days ago she developed diarrhea, having intermittent abdominal pain.  Decreased oral intake. ED workup.  Hypokalemia potassium 2.8, hyponatremia sodium 131, AKI creatinine 1.45, anemia Hb 9.9, UA positive for UTI, CT A/P:1. Obstructing 7 mm distal left ureteral calculus, with high-grade left-sided obstructive uropathy. Marked left renal edema, heterogeneous decreased enhancement, and perinephric fat stranding consistent with superimposed left-sided pyelonephritis. No renal abscess. 2. Punctate nonobstructing right renal calculus.  3. Stable 4.9 cm descending thoracic aortic aneurysm. This is incompletely evaluated on this study due to slice selection. 4.  Aortic Atherosclerosis (ICD10-I70.0).   Urology was consulted and patient was admitted for further management as below.  Assessment and Plan:  # Obstructive nephropathy CT imaging demonstrating a 7 mm distal left ureteral calculus with severe left-sided obstructive uropathy.  Due to evidence of infection, urology was consulted, s/p left ureteral stent placement with Dr. Bernardo Heater. Oxybutynin 5 mg every 8 as needed for stent discomfort. empiric antibiotics and following cultures.   She will require a total of 14 days of culture appropriate therapy. Outpatient left URS/LL/stent exchange in 2 to 3 weeks, we will call to arrange   Acute pyelonephritis CT imaging with evidence of pyelonephritis  in the setting of obstructive uropathy.  Given high risk for sepsis, will cover broadly with meropenem for now. S/p Meropenem, Urine culture growing Klebsiella pneumonia, sensitive to cephalosporin.  Started ceftriaxone 1 g IV daily, transition to ciprofloxacin founded twice daily for additional 10 days to complete 2 weeks course.   S/p IV fluids, continue oral hydration - Norco and Dilaudid for pain control - Zofran as needed for nausea, started PPI for possible GERD    AKI (acute kidney injury) ([place]) In the setting of obstructive uropathy and profound dehydration. Started IV fluids for gentle hydration - Avoid nephrotoxic agents - Monitor urine output Creatinine level 1.4-1.49-1.44 ---1.2   Community-acquired pneumonia Started azithromycin 40 mg daily for 5 days, patient is already on ceftriaxone. Started Mucinex 600 mg p.o. twice daily, Robitussin DM and Tussionex as needed for cough, Tessalon Perles and Cepacol lozenges as needed CXR:  Increased volume loss and opacity at the left lung base which could reflect atelectasis or developing infiltrate. Possible small left pleural effusion.  Known descending thoracic aortic aneurysm with dissection or penetrating ulcer could potentially be related. Consider follow-up chest CTA as clinically warranted. Clinically patient is stable, recommended to follow with PCP as an outpatient for CTA chest for descending thoracic aortic aneurysm and follow-up with vascular surgery as an outpatient.   # Hypokalemia, Secondary to poor p.o. intake over the last several days. Potassium repleted, monitor and replete as needed. # Hypophosphatemia, phosphorus repleted.  Continue nutritional support    # Isotonic hyponatremia secondary to diarrhea vs nutritional deficiency serum osmolality 281 within normal range Started sodium chloride 1 g p.o. 3 times daily Monitor sodium level daily   A-fib ([place]) - Held home Xarelto in the setting of ongoing hematuria  with IDA - Continue home Amiodarone 1/19 resume Xarelto, patient was cleared by urology. Lopressor was held initially due to low blood pressure, resumed with holding parameters.  # Hypothyroid, continue Synthroid 75 mcg p.o. daily home dose # Depression, continue Zoloft home dose   Diarrhea Likely functional diarrhea in the setting of acute intra-abdominal infection.  No colitis noted on imaging. - Imodium as needed Continue probiotics  Acute blood loss anemia due to ureteral stone and B12 deficiency Hb 11.94 months ago Hb 9.9 on admission, trended down to 7.5 --8.0 --8.1 today Monitor H&H and transfuse if hemoglobin less than 7  Vitamin B12 deficiency: Vitamin B12 level 62, goal >400.  Started vitamin B12 1000 mcg IM injection daily during hospital stay, followed by oral supplement.  Follow-up PCP to repeat vitamin B12 level after 3 to 6 months. Folic acid level within normal range  Vitamin D deficiency: started vitamin D 50,000 units p.o. weekly, follow with PCP to repeat vitamin D level after 3 to 6 months.  Body mass index is 24.36 kg/m.  Interventions:      Diet: Regular diet DVT Prophylaxis: Therapeutic Anticoagulation with Xarelto    Advance goals of care discussion: Full code  Family Communication: family was present at bedside, at the time of interview.  The pt provided permission to discuss medical plan with the family. Opportunity was given to ask question and all questions were answered satisfactorily.  1/22 discussed with patient's husband at bedside.  Disposition:  Pt is from Home, admitted with UTI and left ureteral stone status post stent insertion, urine culture growing Klebsiella, on IV antibiotics, still having diarrhea, N/V, low appetite and cough, which precludes a safe discharge. Discharge to Home, when clinically stable, most likely tomorrow a.m.   Subjective: No significant events overnight, patient had 3 episodes of diarrhea 2 in the morning and 1  last night, no diarrhea today.  Patient still feeling nauseous but no vomiting today.  Patient has significant productive cough, no chest pain.  Patient still feels generalized weakness and has not got up and walk.  She will be ambulated today with PT and OT, we will continue to monitor today and plan for discharge tomorrow a.m.   Physical Exam: General: NAD, lying comfortably Appear in no distress, affect appropriate Eyes: PERRLA ENT: Oral Mucosa Clear, moist  Neck: no JVD,  Cardiovascular: S1 and S2 Present, no Murmur,  Respiratory: good respiratory effort, Bilateral Air entry equal and Decreased, mild Crackles, no wheezes Abdomen: Bowel Sound present, Soft and mild LLQ tenderness,  Skin: no rashes Extremities: no Pedal edema, no calf tenderness Neurologic: without any new focal findings Gait not checked due to patient safety concerns  Vitals:   01/19/23 1640 01/19/23 2023 01/20/23 0422 01/20/23 0901  BP: 137/77 124/79 (!) 126/56 (!) 104/48  Pulse: 98 (!) 104 71 72  Resp: '19 18 18 19  '$ Temp: 98 F (36.7 C) 99.4 F (37.4 C) 97.9 F (36.6 C) 98.4 F (36.9 C)  TempSrc:  Oral Oral Oral  SpO2: 95% 95% 96% 96%  Weight:      Height:        Intake/Output Summary (Last 24 hours) at 01/20/2023 1247 Last data filed at 01/20/2023 0400 Gross per 24 hour  Intake 400 ml  Output --  Net 400 ml    Filed Weights   01/13/23 1401  Weight: 66.4 kg    Data Reviewed: I have personally reviewed and interpreted daily labs, tele strips, imagings as discussed above. I  reviewed all nursing notes, pharmacy notes, vitals, pertinent old records I have discussed plan of care as described above with RN and patient/family.  CBC: Recent Labs  Lab 01/16/23 0326 01/17/23 0607 01/18/23 0613 01/19/23 0518 01/20/23 0438  WBC 5.8 5.1 6.2 7.8 8.9  HGB 8.1* 7.5* 8.0* 8.2* 8.1*  HCT 25.8* 23.8* 26.1* 26.5* 26.4*  MCV 82.2 81.8 82.3 82.6 82.8  PLT 145* 178 225 230 053   Basic Metabolic  Panel: Recent Labs  Lab 01/16/23 0326 01/17/23 0607 01/18/23 0613 01/19/23 0518 01/20/23 0438  NA 133* 136 136 136 135  K 3.5 3.2* 3.7 4.7 4.4  CL 103 106 106 106 105  CO2 '24 23 23 23 25  '$ GLUCOSE 104* 105* 99 109* 105*  BUN 26* '18 14 13 13  '$ CREATININE 1.26* 1.21* 1.13* 1.20* 1.21*  CALCIUM 7.4* 7.2* 7.3* 7.4* 7.6*  MG 2.0 1.8 1.8 1.9 1.8  PHOS 2.6 2.6 2.1* 3.0 3.5    Studies: No results found.  Scheduled Meds:  amiodarone  200 mg Oral Daily   atorvastatin  40 mg Oral q1800   azithromycin  500 mg Oral Daily   cyanocobalamin  1,000 mcg Intramuscular Daily   Followed by   Derrill Memo ON 01/22/2023] vitamin B-12  1,000 mcg Oral Daily   feeding supplement  237 mL Oral TID BM   guaiFENesin  600 mg Oral BID   levothyroxine  75 mcg Oral Q0600   metoprolol succinate  25 mg Oral Daily   pantoprazole  40 mg Oral QAC breakfast   Rivaroxaban  15 mg Oral Q supper   saccharomyces boulardii  250 mg Oral BID   sertraline  150 mg Oral Daily   Vitamin D (Ergocalciferol)  50,000 Units Oral Q7 days   Continuous Infusions:  cefTRIAXone (ROCEPHIN)  IV 1 g (01/19/23 2312)   lactated ringers     PRN Meds: acetaminophen **OR** acetaminophen, benzonatate, chlorpheniramine-HYDROcodone, guaiFENesin-dextromethorphan, HYDROcodone-acetaminophen, HYDROmorphone (DILAUDID) injection, ipratropium-albuterol, liver oil-zinc oxide, loperamide, menthol-cetylpyridinium, ondansetron **OR** ondansetron (ZOFRAN) IV, polyethylene glycol  Time spent: 35 minutes  Author: Val Riles. MD [facility] 01/20/2023 12:47 PM  To reach On-call, see care teams to locate the attending and reach out to them via [email or web address]. If 7PM-7AM, please contact night-coverage If you still have difficulty reaching the attending provider, please page the [facility] (Director on Call) for [facility] on amion for assistance.

## 2023-01-20 NOTE — Progress Notes (Signed)
Physical Therapy Treatment Patient Details Name: Tara Marquez MRN: [id number] DOB: [birth year] Today's Date: 01/20/2023   History of Present Illness Pt is an 82 y/o F admitted on 01/13/23 after presenting to the ED with c/o diarrhea. CT imaging demonstrating a 7 mm distal left ureteral calculus with severe left-sided obstructive uropathy & with evidence of pyelonephritis in the setting of obstructive uropathy. Pt is s/p L ureteral stent placement. PMH: a-fib, iron deficiency anemia, chronic hematuria of unknown etiology, HTN, HLD    PT Comments    Pt received in recliner, son present. Overall fair tolerance for activity maintaining SpO2 >90% on RA with induced coughing. Pt trialed with use of SPC during gait and functional mobility. Do to continued weakness, pt was slightly unsteady and will benefit from gait training with Rollator next session. (Pt has one at home). Will continue to progress until d/c home.   Recommendations for follow up therapy are one component of a multi-disciplinary discharge planning process, led by the attending physician.  Recommendations may be updated based on patient status, additional functional criteria and insurance authorization.  Follow Up Recommendations  No PT follow up     Assistance Recommended at Discharge Set up Supervision/Assistance  Patient can return home with the following Assistance with cooking/housework;Help with stairs or ramp for entrance;Assist for transportation   Equipment Recommendations  None recommended by PT (Pt has rollator at home)    Recommendations for Other Services       Precautions / Restrictions Precautions Precautions: Fall Restrictions Weight Bearing Restrictions: No     Mobility  Bed Mobility               General bed mobility comments:  (In recliner pre/post)    Transfers Overall transfer level: Needs assistance Equipment used: 1 person hand held assist Transfers: Sit to/from Stand Sit to Stand: Min  guard, Min assist                Ambulation/Gait Ambulation/Gait assistance: Min assist, Min guard Gait Distance (Feet): 85 Feet Assistive device: Straight cane Gait Pattern/deviations: Step-through pattern, Decreased step length - right, Decreased step length - left, Drifts right/left Gait velocity: decreased     General Gait Details: vc's for proper use of SPC, reaching for rail at times for extra support   Stairs             Wheelchair Mobility    Modified Rankin (Stroke Patients Only)       Balance Overall balance assessment: Needs assistance Sitting-balance support: Feet supported, No upper extremity supported Sitting balance-Leahy Scale: Good     Standing balance support: During functional activity, Single extremity supported, Reliant on assistive device for balance Standing balance-Leahy Scale: Fair Standing balance comment:  (Unsteady with SPC)                            Cognition Arousal/Alertness: Awake/alert Behavior During Therapy: WFL for tasks assessed/performed Overall Cognitive Status: Within Functional Limits for tasks assessed                                 General Comments:  (very pleasant)        Exercises General Exercises - Lower Extremity Ankle Circles/Pumps: AROM, Both, 10 reps, Seated Long Arc Quad: AROM, Both, 10 reps, Seated    General Comments General comments (skin integrity, edema, etc.):  (Pt educated on pulmonary  toileting)      Pertinent Vitals/Pain Pain Assessment Pain Assessment: No/denies pain    Home Living                          Prior Function            PT Goals (current goals can now be found in the care plan section) Acute Rehab PT Goals Patient Stated Goal: feel better Progress towards PT goals: Progressing toward goals    Frequency    Min 2X/week      PT Plan Current plan remains appropriate    Co-evaluation              AM-PAC PT "6  Clicks" Mobility   Outcome Measure  Help needed turning from your back to your side while in a flat bed without using bedrails?: None Help needed moving from lying on your back to sitting on the side of a flat bed without using bedrails?: None Help needed moving to and from a bed to a chair (including a wheelchair)?: A Little Help needed standing up from a chair using your arms (e.g., wheelchair or bedside chair)?: A Little Help needed to walk in hospital room?: A Little Help needed climbing 3-5 steps with a railing? : A Little 6 Click Score: 20    End of Session Equipment Utilized During Treatment: Gait belt Activity Tolerance: Patient limited by fatigue Patient left: in chair;with chair alarm set;with call bell/phone within reach;with family/visitor present Nurse Communication: Mobility status PT Visit Diagnosis: Unsteadiness on feet (R26.81);Muscle weakness (generalized) (M62.81)     Time: 0086-7619 PT Time Calculation (min) (ACUTE ONLY): 35 min  Charges:  $Gait Training: 8-22 mins $Therapeutic Exercise: 8-22 mins                    Mikel Cella, PTA    Tara Marquez 01/20/2023, 12:30 PM

## 2023-01-21 ENCOUNTER — Encounter: Payer: Self-pay | Admitting: Internal Medicine

## 2023-01-21 ENCOUNTER — Other Ambulatory Visit (HOSPITAL_COMMUNITY): Payer: Self-pay

## 2023-01-21 DIAGNOSIS — N138 Other obstructive and reflux uropathy: Secondary | ICD-10-CM | POA: Diagnosis not present

## 2023-01-21 LAB — BASIC METABOLIC PANEL
Anion gap: 6 (ref 5–15)
BUN: 16 mg/dL (ref 8–23)
CO2: 24 mmol/L (ref 22–32)
Calcium: 7.6 mg/dL — ABNORMAL LOW (ref 8.9–10.3)
Chloride: 107 mmol/L (ref 98–111)
Creatinine, Ser: 1.31 mg/dL — ABNORMAL HIGH (ref 0.44–1.00)
GFR, Estimated: 41 mL/min — ABNORMAL LOW (ref >60)
Glucose, Bld: 100 mg/dL — ABNORMAL HIGH (ref 70–99)
Potassium: 3.9 mmol/L (ref 3.5–5.1)
Sodium: 137 mmol/L (ref 135–145)

## 2023-01-21 LAB — CBC
HCT: 23.4 % — ABNORMAL LOW (ref 36.0–46.0)
Hemoglobin: 7.1 g/dL — ABNORMAL LOW (ref 12.0–15.0)
MCH: 25.4 pg — ABNORMAL LOW (ref 26.0–34.0)
MCHC: 30.3 g/dL (ref 30.0–36.0)
MCV: 83.6 fL (ref 80.0–100.0)
Platelets: 205 10*3/uL (ref 150–400)
RBC: 2.8 MIL/uL — ABNORMAL LOW (ref 3.87–5.11)
RDW: 16.3 % — ABNORMAL HIGH (ref 11.5–15.5)
WBC: 7 10*3/uL (ref 4.0–10.5)
nRBC: 0 % (ref 0.0–0.2)

## 2023-01-21 LAB — PREPARE RBC (CROSSMATCH)

## 2023-01-21 LAB — MAGNESIUM: Magnesium: 1.7 mg/dL (ref 1.7–2.4)

## 2023-01-21 LAB — PHOSPHORUS: Phosphorus: 3.3 mg/dL (ref 2.5–4.6)

## 2023-01-21 LAB — ABO/RH: ABO/RH(D): O POS

## 2023-01-21 MED ORDER — APIXABAN 2.5 MG PO TABS
2.5000 mg | ORAL_TABLET | Freq: Two times a day (BID) | ORAL | Status: DC
  Administered 2023-01-21 – 2023-01-22 (×2): 2.5 mg via ORAL
  Filled 2023-01-21 (×2): qty 1

## 2023-01-21 MED ORDER — MAGNESIUM SULFATE 2 GM/50ML IV SOLN
2.0000 g | Freq: Once | INTRAVENOUS | Status: AC
  Administered 2023-01-21: 2 g via INTRAVENOUS
  Filled 2023-01-21: qty 50

## 2023-01-21 MED ORDER — SODIUM CHLORIDE 0.9% IV SOLUTION: Freq: Once | INTRAVENOUS | Status: AC

## 2023-01-21 NOTE — Progress Notes (Signed)
PT Cancellation Note  Patient Details Name: Tara Marquez MRN: [id number] DOB: February 10, 1941   Cancelled Treatment:     Attempted to see pt for gait training with Rollator this am. Pt's Hgb 7.1, symptomatic, and awaiting transfusion. Will try back in pm if time permits.    Josie Dixon 01/21/2023, 10:51 AM

## 2023-01-21 NOTE — TOC Initial Note (Signed)
Transition of Care [facility]) - Initial/Assessment Note    Patient Details  Name: Tara Marquez MRN: [id number] Date of Birth: 1941/10/31  Transition of Care [facility]) CM/SW Contact:    Gerilyn Pilgrim, LCSW Phone Number: 01/21/2023, 9:55 AM  Clinical Narrative:   SW spoke with patient regarding discharge plan. Pt reports she has been driving herself to appointments but if she is unable to discharge her husband can assist. Pt reports she is not interested in any [facility] but would like a RW. Pt currently has a rollator but states it was her husbands moms. CSW will contact [employer] to deliver at time of discharge. Pt sees Dr Rosanna Randy for primary care and uses [facility] on [address] for her medications.                  Expected Discharge Plan: Home/Self Care Barriers to Discharge: Continued Medical Work up   Patient Goals and CMS Choice Patient states their goals for this hospitalization and ongoing recovery are:: return home with husband CMS Medicare.gov Compare Post Acute Care list provided to:: Patient Choice offered to / list presented to : Patient      Expected Discharge Plan and Services     Post Acute Care Choice: Durable Medical Equipment Living arrangements for the past 2 months: Single Family Home Expected Discharge Date: 01/17/23               DME Arranged: Gilford Rile rolling DME Agency: [employer] Date DME Agency Contacted: 01/21/23                Prior Living Arrangements/Services Living arrangements for the past 2 months: Single Family Home Lives with:: Spouse Patient language and need for interpreter reviewed:: Yes Do you feel safe going back to the place where you live?: Yes      Need for Family Participation in Patient Care: Yes (Comment) Care giver support system in place?: Yes (comment)   Criminal Activity/Legal Involvement Pertinent to Current Situation/Hospitalization: No - Comment as needed  Activities of Daily Living Home Assistive Devices/Equipment: None ADL  Screening (condition at time of admission) Patient's cognitive ability adequate to safely complete daily activities?: Yes Is the patient deaf or have difficulty hearing?: No Does the patient have difficulty seeing, even when wearing glasses/contacts?: No Does the patient have difficulty concentrating, remembering, or making decisions?: No Patient able to express need for assistance with ADLs?: Yes Does the patient have difficulty dressing or bathing?: No Independently performs ADLs?: Yes (appropriate for developmental age) Does the patient have difficulty walking or climbing stairs?: No Weakness of Legs: Both Weakness of Arms/Hands: None  Permission Sought/Granted            Permission granted to share info w Relationship: husband  Permission granted to share info w Contact Information: [employer]  Emotional Assessment Appearance:: Appears stated age     Orientation: : Oriented to Self, Oriented to Place, Oriented to  Time, Oriented to Situation Alcohol / Substance Use: Never Used    Admission diagnosis:  Dehydration [E86.0] Hypokalemia [E87.6] Kidney stone [N20.0] Obstructive nephropathy [N13.8] Pyelonephritis [N12] Obstructive uropathy [N13.9] Nausea vomiting and diarrhea [R11.2, R19.7] Patient Active Problem List   Diagnosis Date Noted   Obstructive nephropathy 01/13/2023   Acute pyelonephritis 01/13/2023   AKI (acute kidney injury) ([place]) 01/13/2023   Hypokalemia 01/13/2023   Diarrhea 01/13/2023   Thoracic aortic aneurysm without rupture ([place]) 11/09/2022   Hematuria 11/05/2022   Polyp of esophagus 11/02/2022   Iron deficiency anemia 07/28/2022  Positive colorectal cancer screening using Cologuard test    Abnormal ECG 03/31/2021   Acute non-recurrent sinusitis 11/30/2020   Acute otitis media 11/30/2020   Benign essential HTN 10/31/2018   Frequent PVCs 09/08/2017   A-fib ([place]) 08/27/2015   Clinical depression 08/27/2015   Blood pressure elevated 08/27/2015    Polypharmacy 08/27/2015   Esophagitis, reflux 08/27/2015   Fatigue 08/27/2015   Acid reflux 08/27/2015   HLD (hyperlipidemia) 08/27/2015   Adult hypothyroidism 08/27/2015   Adaptive colitis 08/27/2015   Malaise and fatigue 08/27/2015   Mild major depression ([place]) 08/27/2015   Polymyalgia rheumatica ([place]) 08/27/2015   Cranial arteritis ([place]) 08/27/2015   Avitaminosis D 08/27/2015   PCP:  Jerrol Banana., MD Pharmacy:   [facility] [address], [place] [address] Phone: [phone] Fax: [phone]     Social Determinants of Health (SDOH) Social History: SDOH Screenings   Food Insecurity: No Food Insecurity (01/13/2023)  Housing: Low Risk  (01/13/2023)  Transportation Needs: No Transportation Needs (01/13/2023)  Utilities: Not At Risk (01/13/2023)  Alcohol Screen: Low Risk  (09/16/2022)  Depression (PHQ2-9): Medium Risk (09/16/2022)  Financial Resource Strain: Low Risk  (04/21/2022)  Physical Activity: Unknown (04/21/2022)  Social Connections: Socially Integrated (04/21/2022)  Stress: No Stress Concern Present (04/21/2022)  Tobacco Use: Low Risk  (01/14/2023)   SDOH Interventions:     Readmission Risk Interventions     No data to display

## 2023-01-21 NOTE — Progress Notes (Signed)
[facility] Progress Note  Patient: Tara Marquez    ZOX:[id number]  DOA: 01/13/2023     Date of Service: the patient was seen and examined on 01/21/2023  Chief Complaint  Patient presents with   Diarrhea   Brief hospital course: Tara Marquez is a 82 y.o. female with medical history significant of atrial fibrillation, iron deficiency anemia, chronic hematuria of unknown etiology, hypertension, hyperlipidemia, who presents to the ED due to diarrhea.  For past 5 days patient is feeling generalized weakness, nausea vomiting and lack of appetite.  2 days ago she developed diarrhea, having intermittent abdominal pain.  Decreased oral intake. ED workup.  Hypokalemia potassium 2.8, hyponatremia sodium 131, AKI creatinine 1.45, anemia Hb 9.9, UA positive for UTI, CT A/P:1. Obstructing 7 mm distal left ureteral calculus, with high-grade left-sided obstructive uropathy. Marked left renal edema, heterogeneous decreased enhancement, and perinephric fat stranding consistent with superimposed left-sided pyelonephritis. No renal abscess. 2. Punctate nonobstructing right renal calculus.  3. Stable 4.9 cm descending thoracic aortic aneurysm. This is incompletely evaluated on this study due to slice selection. 4.  Aortic Atherosclerosis (ICD10-I70.0).   Urology was consulted and patient was admitted for further management as below.  Assessment and Plan:  # Obstructive nephropathy CT imaging demonstrating a 7 mm distal left ureteral calculus with severe left-sided obstructive uropathy.  Due to evidence of infection, urology was consulted, s/p left ureteral stent placement with Dr. Bernardo Heater. Oxybutynin 5 mg every 8 as needed for stent discomfort. empiric antibiotics and following cultures.   She will require a total of 14 days of culture appropriate therapy. Outpatient left URS/LL/stent exchange in 2 to 3 weeks, we will call to arrange   Acute pyelonephritis CT imaging with evidence of pyelonephritis  in the setting of obstructive uropathy.  Given high risk for sepsis, will cover broadly with meropenem for now. S/p Meropenem, Urine culture growing Klebsiella pneumonia, sensitive to cephalosporin.  Started ceftriaxone 1 g IV daily, transition to ciprofloxacin founded twice daily for additional 10 days to complete 2 weeks course.   S/p IV fluids, continue oral hydration - Norco and Dilaudid for pain control - Zofran as needed for nausea, started PPI for possible GERD    AKI (acute kidney injury) ([place]) In the setting of obstructive uropathy and profound dehydration. Started IV fluids for gentle hydration - Avoid nephrotoxic agents - Monitor urine output Creatinine level 1.4-1.49-1.44 ---1.2   Community-acquired pneumonia Started azithromycin 40 mg daily for 5 days, patient is already on ceftriaxone. Started Mucinex 600 mg p.o. twice daily, Robitussin DM and Tussionex as needed for cough, Tessalon Perles and Cepacol lozenges as needed CXR:  Increased volume loss and opacity at the left lung base which could reflect atelectasis or developing infiltrate. Possible small left pleural effusion.  Known descending thoracic aortic aneurysm with dissection or penetrating ulcer could potentially be related. Consider follow-up chest CTA as clinically warranted. Clinically patient is stable, recommended to follow with PCP as an outpatient for CTA chest for descending thoracic aortic aneurysm and follow-up with vascular surgery as an outpatient.   # Hypokalemia, Secondary to poor p.o. intake over the last several days. Potassium repleted, monitor and replete as needed. # Hypophosphatemia, phosphorus repleted.  Continue nutritional support    # Isotonic hyponatremia secondary to diarrhea vs nutritional deficiency serum osmolality 281 within normal range Started sodium chloride 1 g p.o. 3 times daily Monitor sodium level daily   A-fib ([place]) Held home Xarelto in the setting of ongoing hematuria with  IDA - Continue home Amiodarone On 1/19 resume Xarelto, patient was cleared by urology. Lopressor was held initially due to low blood pressure, resumed with holding parameters. On 1/26 due to low hemoglobin, no active bleeding noticed.  Xarelto was discontinued and started on Eliquis 2.5 mg p.o. twice daily.  Started low-dose due to risk of bleeding.  Patient was recommended to follow with cardiology for possible Watchman device.   # Hypothyroid, continue Synthroid 75 mcg p.o. daily home dose # Depression, continue Zoloft home dose   Diarrhea Likely functional diarrhea in the setting of acute intra-abdominal infection.  No colitis noted on imaging. - Imodium as needed Continue probiotics  Acute blood loss anemia due to ureteral stone and B12 deficiency Hb 11.94 months ago Hb 9.9 on admission, trended down to 7.5 --8.0 --8.1 today 1/26 Hb 7.1, at lower end, transfuse 1 unit of PRBC, monitor H&H Monitor H&H and transfuse prn Check FOBT, patient was recommended to follow-up with GI as an outpatient.  Vitamin B12 deficiency: Vitamin B12 level 62, goal >400.  Started vitamin B12 1000 mcg IM injection daily during hospital stay, followed by oral supplement.  Follow-up PCP to repeat vitamin B12 level after 3 to 6 months. Folic acid level within normal range  Vitamin D deficiency: started vitamin D 50,000 units p.o. weekly, follow with PCP to repeat vitamin D level after 3 to 6 months.  Body mass index is 24.36 kg/m.  Interventions:      Diet: Regular diet DVT Prophylaxis: Therapeutic Anticoagulation with Xarelto    Advance goals of care discussion: Full code  Family Communication: family was present at bedside, at the time of interview.  The pt provided permission to discuss medical plan with the family. Opportunity was given to ask question and all questions were answered satisfactorily.  1/22 discussed with patient's husband at bedside.  Disposition:  Pt is from Home, admitted  with UTI and left ureteral stone status post stent insertion, urine culture growing Klebsiella, on IV antibiotics, still having diarrhea, N/V, low appetite and cough, which precludes a safe discharge. Discharge to Home, when clinically stable, most likely tomorrow a.m.   Subjective: No significant events overnight, patient feels improvement in the cough but it still has rattling cough, overall feels improvement.  Denies any diarrhea today, had 1 episode of diarrhea yesterday, nausea is improving, no vomiting.  Feels nausea especially after taking oral medications in the morning. Patient agreed with the blood transfusion and continue Xarelto and start Eliquis.  Physical Exam: General: NAD, lying comfortably Appear in no distress, affect appropriate Eyes: PERRLA ENT: Oral Mucosa Clear, moist  Neck: no JVD,  Cardiovascular: S1 and S2 Present, no Murmur,  Respiratory: good respiratory effort, Bilateral Air entry equal and Decreased, mild Crackles, no wheezes Abdomen: Bowel Sound present, Soft and mild LLQ tenderness,  Skin: no rashes Extremities: no Pedal edema, no calf tenderness Neurologic: without any new focal findings Gait not checked due to patient safety concerns  Vitals:   01/21/23 0506 01/21/23 0802 01/21/23 1514 01/21/23 1543  BP: (!) 116/47 (!) 127/53 (!) 117/54 (!) 113/53  Pulse: 68 71 91 100  Resp: '16 18 16 17  '$ Temp: 98.3 F (36.8 C) 98.2 F (36.8 C) 97.7 F (36.5 C) 98 F (36.7 C)  TempSrc:   Oral Oral  SpO2: 94% 97% 95% 94%  Weight:      Height:        Intake/Output Summary (Last 24 hours) at 01/21/2023 1547 Last data filed at 01/21/2023  1043 Gross per 24 hour  Intake 760 ml  Output --  Net 760 ml    Filed Weights   01/13/23 1401  Weight: 66.4 kg    Data Reviewed: I have personally reviewed and interpreted daily labs, tele strips, imagings as discussed above. I reviewed all nursing notes, pharmacy notes, vitals, pertinent old records I have discussed plan  of care as described above with RN and patient/family.  CBC: Recent Labs  Lab 01/17/23 0607 01/18/23 0613 01/19/23 0518 01/20/23 0438 01/21/23 0622  WBC 5.1 6.2 7.8 8.9 7.0  HGB 7.5* 8.0* 8.2* 8.1* 7.1*  HCT 23.8* 26.1* 26.5* 26.4* 23.4*  MCV 81.8 82.3 82.6 82.8 83.6  PLT 178 225 230 226 829   Basic Metabolic Panel: Recent Labs  Lab 01/17/23 0607 01/18/23 0613 01/19/23 0518 01/20/23 0438 01/21/23 0622  NA 136 136 136 135 137  K 3.2* 3.7 4.7 4.4 3.9  CL 106 106 106 105 107  CO2 '23 23 23 25 24  '$ GLUCOSE 105* 99 109* 105* 100*  BUN '18 14 13 13 16  '$ CREATININE 1.21* 1.13* 1.20* 1.21* 1.31*  CALCIUM 7.2* 7.3* 7.4* 7.6* 7.6*  MG 1.8 1.8 1.9 1.8 1.7  PHOS 2.6 2.1* 3.0 3.5 3.3    Studies: No results found.  Scheduled Meds:  amiodarone  200 mg Oral Daily   apixaban  2.5 mg Oral BID   atorvastatin  40 mg Oral q1800   azithromycin  500 mg Oral Daily   [START ON 01/22/2023] vitamin B-12  1,000 mcg Oral Daily   feeding supplement  237 mL Oral TID BM   guaiFENesin  600 mg Oral BID   levothyroxine  75 mcg Oral Q0600   metoprolol succinate  25 mg Oral Daily   pantoprazole  40 mg Oral QAC breakfast   saccharomyces boulardii  250 mg Oral BID   sertraline  150 mg Oral Daily   Vitamin D (Ergocalciferol)  50,000 Units Oral Q7 days   Continuous Infusions:  cefTRIAXone (ROCEPHIN)  IV 1 g (01/20/23 2150)   lactated ringers     PRN Meds: acetaminophen **OR** acetaminophen, benzonatate, chlorpheniramine-HYDROcodone, guaiFENesin-dextromethorphan, HYDROcodone-acetaminophen, HYDROmorphone (DILAUDID) injection, ipratropium-albuterol, liver oil-zinc oxide, loperamide, menthol-cetylpyridinium, ondansetron **OR** ondansetron (ZOFRAN) IV, polyethylene glycol  Time spent: 55 minutes  Author: Val Riles. MD [facility] 01/21/2023 3:47 PM  To reach On-call, see care teams to locate the attending and reach out to them via [email or web address]. If 7PM-7AM, please contact night-coverage If  you still have difficulty reaching the attending provider, please page the [facility] (Director on Call) for [facility] on amion for assistance.

## 2023-01-21 NOTE — TOC Benefit Eligibility Note (Signed)
Patient Teacher, English as a foreign language completed.    The patient is currently admitted and upon discharge could be taking Eliquis 2.5 mg.  The current 30 day co-pay is $247.00 due to a $200.00 deductible will be $47.00 once deductible is met.   The patient is insured through [place], [place] Patient Advocate Specialist [place] Patient Advocate Team Direct Number: [phone]  Fax: [phone]

## 2023-01-22 DIAGNOSIS — N138 Other obstructive and reflux uropathy: Secondary | ICD-10-CM | POA: Diagnosis not present

## 2023-01-22 LAB — CBC
HCT: 32.7 % — ABNORMAL LOW (ref 36.0–46.0)
Hemoglobin: 10.1 g/dL — ABNORMAL LOW (ref 12.0–15.0)
MCH: 25.4 pg — ABNORMAL LOW (ref 26.0–34.0)
MCHC: 30.9 g/dL (ref 30.0–36.0)
MCV: 82.2 fL (ref 80.0–100.0)
Platelets: 275 10*3/uL (ref 150–400)
RBC: 3.98 MIL/uL (ref 3.87–5.11)
RDW: 15.8 % — ABNORMAL HIGH (ref 11.5–15.5)
WBC: 9.5 10*3/uL (ref 4.0–10.5)
nRBC: 0 % (ref 0.0–0.2)

## 2023-01-22 LAB — BPAM RBC
Blood Product Expiration Date: 202403022359
ISSUE DATE / TIME: 202401261523
Unit Type and Rh: 5100

## 2023-01-22 LAB — TYPE AND SCREEN
ABO/RH(D): O POS
Antibody Screen: NEGATIVE
Unit division: 0

## 2023-01-22 LAB — BASIC METABOLIC PANEL
Anion gap: 10 (ref 5–15)
BUN: 14 mg/dL (ref 8–23)
CO2: 24 mmol/L (ref 22–32)
Calcium: 7.7 mg/dL — ABNORMAL LOW (ref 8.9–10.3)
Chloride: 103 mmol/L (ref 98–111)
Creatinine, Ser: 1.28 mg/dL — ABNORMAL HIGH (ref 0.44–1.00)
GFR, Estimated: 42 mL/min — ABNORMAL LOW (ref >60)
Glucose, Bld: 90 mg/dL (ref 70–99)
Potassium: 3.9 mmol/L (ref 3.5–5.1)
Sodium: 137 mmol/L (ref 135–145)

## 2023-01-22 LAB — PHOSPHORUS: Phosphorus: 3.1 mg/dL (ref 2.5–4.6)

## 2023-01-22 LAB — MAGNESIUM: Magnesium: 2.2 mg/dL (ref 1.7–2.4)

## 2023-01-22 MED ORDER — APIXABAN 2.5 MG PO TABS: 2.5000 mg | ORAL_TABLET | Freq: Two times a day (BID) | ORAL | 0 refills | Status: AC

## 2023-01-22 MED ORDER — CIPROFLOXACIN HCL 500 MG PO TABS: 500.0000 mg | ORAL_TABLET | Freq: Two times a day (BID) | ORAL | 0 refills | Status: AC

## 2023-01-22 NOTE — Progress Notes (Signed)
PT Cancellation Note  Patient Details Name: Tara Marquez MRN: [id number] DOB: October 03, 1941   Cancelled Treatment:    Reason Eval/Treat Not Completed: Patient declined, no reason specified Offered PT this morning; pt declined wanting to get ready to leave with husband and felt she did not need to practice mobility before going home today.   Bjorn Loser, PTA  01/22/23, 9:45 AM

## 2023-01-22 NOTE — TOC Transition Note (Signed)
Transition of Care [facility]) - CM/SW Discharge Note   Patient Details  Name: Tara Marquez MRN: [id number] Date of Birth: Jan 26, 1941  [facility] of Care [facility]) CM/SW Contact:  Izola Price, RN Phone Number: 01/22/2023, 9:22 AM   Clinical Narrative: 1/27: Ordered BSC from [employer], order received and progress note placed for Medicare need. Patient has discharge orders to home/self care. Declined HH on discharge per prior CSW CM initial TOC note. Simmie Davies RN CM      Final next level of care: Home/Self Care Barriers to Discharge: Barriers Resolved   Patient Goals and CMS Choice CMS Medicare.gov Compare Post Acute Care list provided to:: Patient Choice offered to / list presented to : Patient  Discharge Placement                         Discharge Plan and Services Additional resources added to the After Visit Summary for       Post Acute Care Choice: Durable Medical Equipment          DME Arranged: 3-N-1 DME Agency: [employer] Date DME Agency Contacted: 01/22/23 Time DME Agency Contacted: [phone] Representative spoke with at DME Agency: [place]: NA [place] Agency: NA        Social Determinants of Health ([place]) Interventions SDOH Screenings   Food Insecurity: No Food Insecurity (01/13/2023)  Housing: Low Risk  (01/13/2023)  Transportation Needs: No Transportation Needs (01/13/2023)  Utilities: Not At Risk (01/13/2023)  Alcohol Screen: Low Risk  (09/16/2022)  Depression (PHQ2-9): Medium Risk (09/16/2022)  Financial Resource Strain: Low Risk  (04/21/2022)  Physical Activity: Unknown (04/21/2022)  Social Connections: Socially Integrated (04/21/2022)  Stress: No Stress Concern Present (04/21/2022)  Tobacco Use: Low Risk  (01/14/2023)     Readmission Risk Interventions     No data to display

## 2023-01-22 NOTE — Progress Notes (Cosign Needed)
Patient is not able to walk the distance required to go the bathroom, or he/she is unable to safely negotiate stairs required to access the bathroom.  A BSC will alleviate this problem. 

## 2023-01-22 NOTE — Discharge Summary (Signed)
Physician Discharge Summary  SHLEY Marquez PPJ:[id number] DOB: 1941/10/27 DOA: 01/13/2023  PCP: Jerrol Banana., MD  Admit date: 01/13/2023 Discharge date: 01/22/2023 30 Day Unplanned Readmission Risk Score    Flowsheet Row ED to Hosp-Admission (Current) from 01/13/2023 in [facility]  30 Day Unplanned Readmission Risk Score (%) 23.12 Filed at 01/22/2023 0800       This score is the patient's risk of an unplanned readmission within 30 days of being discharged (0 -100%). The score is based on dignosis, age, lab data, medications, orders, and past utilization.   Low:  0-14.9   Medium: 15-21.9   High: 22-29.9   Extreme: 30 and above          Admitted From: Home Disposition: Home  Recommendations for Outpatient Follow-up:  Follow up with PCP in 1-2 weeks Please obtain BMP/CBC in one week Follow-up with urology in 2 to 3 weeks, please call for appointment Please follow up with your PCP on the following pending results: Unresulted Labs (From admission, onward)     Start     Ordered   01/21/23 0831  Occult blood card to lab, stool  Daily,   R      01/21/23 0830   01/20/23 5809  Basic metabolic panel  Daily,   R     Question:  Specimen collection method  Answer:  Lab=Lab collect   01/19/23 0852   01/20/23 0500  CBC  Daily,   R     Question:  Specimen collection method  Answer:  Lab=Lab collect   01/19/23 0852              Home Health: None-patient declined Equipment/Devices: None  Discharge Condition: Stable CODE STATUS: Full code Diet recommendation: Cardiac  Subjective: Seen and examined. Patient is doing well, she has no complaints.  She wants to go home.  Brief/Interim Summary:  Tara Marquez is a 82 y.o. female with medical history significant of atrial fibrillation, iron deficiency anemia, chronic hematuria of unknown etiology, hypertension, hyperlipidemia, who presented to the ED due to diarrhea, generalized  weakness, nausea vomiting and lack of appetite.   ED workup.  Hypokalemia potassium 2.8, hyponatremia sodium 131, AKI creatinine 1.45, anemia Hb 9.9, UA positive for UTI, CT A/P:1. Obstructing 7 mm distal left ureteral calculus, with high-grade left-sided obstructive uropathy. Marked left renal edema, heterogeneous decreased enhancement, and perinephric fat stranding consistent with superimposed left-sided pyelonephritis. No renal abscess. 2. Punctate nonobstructing right renal calculus.  3. Stable 4.9 cm descending thoracic aortic aneurysm. This is incompletely evaluated on this study due to slice selection. 4.  Aortic Atherosclerosis (ICD10-I70.0).    Urology was consulted and patient was admitted for further management, details as below.  # Obstructive nephropathy/acute pyelonephritis CT imaging demonstrating a 7 mm distal left ureteral calculus with severe left-sided obstructive uropathy,  with evidence of pyelonephritis..  Due to evidence of infection, urology was consulted, s/p left ureteral stent placement with Dr. Bernardo Heater.  She was started on antibiotics, S/p Meropenem, Urine culture growing Klebsiella pneumonia, sensitive to cephalosporin.  Started ceftriaxone 1 g IV daily, transitioned to ciprofloxacin founded twice daily for additional 7 days to complete 2 weeks course. She will require a total of 14 days of culture appropriate therapy. Outpatient left URS/LL/stent exchange in 2 to 3 weeks, patient to call urology to arrange outpatient follow-up.     AKI (acute kidney injury) ([place]) on CKD stage III, patient's baseline creatinine appears to be around  1.1 EGFR around 46, presented with creatinine of 1.45, now back to 1.2, very close to her baseline.     Community-acquired pneumonia Patient completed antibiotics for this.  She is doing well with no respiratory complaints.     # Hypokalemia, resolved. # Hypophosphatemia, phosphorus repleted.  Continue nutritional support     #  Isotonic hyponatremia secondary to diarrhea vs nutritional deficiency Normalized now.   A-fib [facility]) Held home Xarelto in the setting of ongoing hematuria with IDA - Continue home Amiodarone On 1/19 resume Xarelto, patient was cleared by urology. Lopressor was held initially due to low blood pressure, resumed with holding parameters. On 1/26 due to low hemoglobin, no active bleeding noticed.  Xarelto was discontinued and started on Eliquis 2.5 mg p.o. twice daily.  Started low-dose due to risk of bleeding.  Patient was recommended to follow with cardiology for possible Watchman device.    # Hypothyroid, continue Synthroid 75 mcg p.o. daily home dose # Depression, continue Zoloft home dose     Diarrhea Likely functional diarrhea in the setting of acute intra-abdominal infection.  No colitis noted on imaging.  Negative.   Acute blood loss anemia due to ureteral stone and B12 deficiency Hb 11.94 months ago Hb 9.9 on admission, trended down to 7.5 --8.0 --8.1 1/26 Hb 7.1, at lower end, she was transfused 1 unit of PRBC.  Hemoglobin over 8 today. patient was recommended to follow-up with GI as an outpatient.   Vitamin B12 deficiency: Vitamin B12 level 62, goal >400.  Started vitamin B12 1000 mcg IM injection daily during hospital stay, followed by oral supplement.  Follow-up PCP to repeat vitamin B12 level after 3 to 6 months. Folic acid level within normal range   Vitamin D deficiency: started vitamin D 50,000 units p.o. weekly, follow with PCP to repeat vitamin D level after 3 to 6 months.  Discharge plan was discussed with patient and/or family member and they verbalized understanding and agreed with it.  Discharge Diagnoses:  Principal Problem:   Obstructive nephropathy Active Problems:   Acute pyelonephritis   AKI (acute kidney injury) ([place])   Hypokalemia   A-fib ([facility])   Diarrhea    Discharge Instructions  Discharge Instructions     Call MD for:  difficulty breathing,  headache or visual disturbances   Complete by: As directed    Call MD for:  extreme fatigue   Complete by: As directed    Call MD for:  persistant dizziness or light-headedness   Complete by: As directed    Call MD for:  persistant nausea and vomiting   Complete by: As directed    Call MD for:  severe uncontrolled pain   Complete by: As directed    Call MD for:  temperature >100.4   Complete by: As directed    Diet - low sodium heart healthy   Complete by: As directed    Discharge instructions   Complete by: As directed    Follow-up with PCP in 1 week, repeat B12 and vitamin D level after 3 months Follow with urology in 1 week for definitive treatment of renal stone   Increase activity slowly   Complete by: As directed       Allergies as of 01/22/2023       Reactions   Sulfa Antibiotics Hives   Amoxicillin Rash   Penicillins Rash        Medication List     STOP taking these medications    metoprolol tartrate  25 MG tablet Commonly known as: LOPRESSOR   rivaroxaban 20 MG Tabs tablet Commonly known as: XARELTO       TAKE these medications    amiodarone 200 MG tablet Commonly known as: Pacerone Take 1 tablet (200 mg total) by mouth 2 (two) times daily. What changed: when to take this   apixaban 2.5 MG Tabs tablet Commonly known as: ELIQUIS Take 1 tablet (2.5 mg total) by mouth 2 (two) times daily.   atorvastatin 40 MG tablet Commonly known as: LIPITOR Take 1 tablet (40 mg total) by mouth daily at 6 PM.   ciprofloxacin 500 MG tablet Commonly known as: Cipro Take 1 tablet (500 mg total) by mouth 2 (two) times daily for 7 days.   cyanocobalamin 1000 MCG tablet Take 1 tablet (1,000 mcg total) by mouth daily.   levothyroxine 75 MCG tablet Commonly known as: SYNTHROID Take 1 tablet (75 mcg total) by mouth daily.   metoprolol succinate 25 MG 24 hr tablet Commonly known as: TOPROL-XL Take 25 mg by mouth at bedtime.   nitroGLYCERIN 0.4 MG SL  tablet Commonly known as: NITROSTAT Place 1 tablet (0.4 mg total) under the tongue every 5 (five) minutes as needed for chest pain.   pantoprazole 40 MG tablet Commonly known as: PROTONIX Take 1 tablet (40 mg total) by mouth daily before breakfast for 14 days.   saccharomyces boulardii 250 MG capsule Commonly known as: FLORASTOR Take 1 capsule (250 mg total) by mouth 2 (two) times daily for 10 days.   sertraline 100 MG tablet Commonly known as: ZOLOFT TAKE 1 & 1/2 (ONE & ONE-HALF) TABLETS BY MOUTH ONCE DAILY NEED  TO  SCHEDULE  AN  APPOINTMENT   Vitamin D (Ergocalciferol) 1.25 MG (50000 UNIT) Caps capsule Commonly known as: DRISDOL Take 1 capsule (50,000 Units total) by mouth every 7 (seven) days.        Follow-up Information     Jerrol Banana., MD Follow up.   Specialty: Family Medicine        Jerrol Banana., MD Follow up in 1 week(s).   Specialty: Family Medicine               Allergies  Allergen Reactions   Sulfa Antibiotics Hives   Amoxicillin Rash   Penicillins Rash    Consultations: Urology   Procedures/Studies: DG Chest Port 1 View  Result Date: 01/18/2023 CLINICAL DATA:  Shortness of breath. EXAM: PORTABLE CHEST 1 VIEW COMPARISON:  Radiographs 10/22/2018 and 10/20/2018. Chest CT 09/29/2022 and abdominal CT 01/13/2023 FINDINGS: 1038 hours. The heart size and mediastinal contours are grossly stable with aortic atherosclerosis. Patient has a known aneurysm of the descending aorta with associated penetrating ulcer or short segment dissection. Compared with the recent CT exams, there is increased volume loss and opacity at the left lung base which could reflect atelectasis or developing infiltrate. The right lung remains clear. There may be a small left pleural effusion. No pneumothorax or acute osseous abnormality identified. IMPRESSION: 1. Increased volume loss and opacity at the left lung base which could reflect atelectasis or developing  infiltrate. Possible small left pleural effusion. 2. Known descending thoracic aortic aneurysm with dissection or penetrating ulcer could potentially be related. Consider follow-up chest CTA as clinically warranted. Electronically Signed   By: Richardean Sale M.D.   On: 01/18/2023 10:49   DG OR UROLOGY CYSTO IMAGE ([facility] ONLY)  Result Date: 01/13/2023 There is no interpretation for this exam.  This order is for  images obtained during a surgical procedure.  Please See "Surgeries" Tab for more information regarding the procedure.   CT Abdomen Pelvis W Contrast  Result Date: 01/13/2023 CLINICAL DATA:  Abdominal pain and diarrhea since Sunday EXAM: CT ABDOMEN AND PELVIS WITH CONTRAST TECHNIQUE: Multidetector CT imaging of the abdomen and pelvis was performed using the standard protocol following bolus administration of intravenous contrast. RADIATION DOSE REDUCTION: This exam was performed according to the departmental dose-optimization program which includes automated exposure control, adjustment of the mA and/or kV according to patient size and/or use of iterative reconstruction technique. CONTRAST:  49m OMNIPAQUE IOHEXOL 300 MG/ML  SOLN COMPARISON:  08/27/2022 FINDINGS: Lower chest: No acute pleural or parenchymal lung disease. The inferior extent of the known descending thoracic aortic aneurysm is identified, measuring up to 4.9 cm in maximal transverse dimension reference image 60/6. This is incompletely assessed on this exam. Hepatobiliary: No focal liver abnormality is seen. No gallstones, gallbladder wall thickening, or biliary dilatation. Pancreas: Unremarkable. No pancreatic ductal dilatation or surrounding inflammatory changes. Spleen: Stable splenomegaly. Adrenals/Urinary Tract: There is an obstructing 7 mm distal left ureteral calculus reference image 72/2, with severe left-sided obstructive uropathy. There is marked left renal edema, heterogeneous decreased enhancement, and perinephric stranding  consistent with superimposed pyelonephritis. Debris is identified within the left renal collecting system. Incidental punctate 2 mm nonobstructing distal left ureteral calculus calculus is seen just proximal to the obstructing 7 mm calculus as well. Punctate 2 mm nonobstructing right renal calculus is unchanged. The right kidney enhances normally. The adrenals and bladder are unremarkable. Stomach/Bowel: No bowel obstruction or ileus. No bowel wall thickening or inflammatory change. Vascular/Lymphatic: Stable aortic atherosclerosis. Subcentimeter left para-aortic lymph nodes are likely reactive. No pathologic adenopathy. Reproductive: Status post hysterectomy. No adnexal masses. Other: Mild left-sided retroperitoneal fat stranding secondary to the high-grade left-sided obstructive uropathy described above. No free intraperitoneal fluid or free gas. No abdominal wall hernia. Musculoskeletal: No acute or destructive bony lesions. Reconstructed images demonstrate no additional findings. IMPRESSION: 1. Obstructing 7 mm distal left ureteral calculus, with high-grade left-sided obstructive uropathy. Marked left renal edema, heterogeneous decreased enhancement, and perinephric fat stranding consistent with superimposed left-sided pyelonephritis. No renal abscess. 2. Punctate nonobstructing right renal calculus. 3. Stable 4.9 cm descending thoracic aortic aneurysm. This is incompletely evaluated on this study due to slice selection. 4.  Aortic Atherosclerosis (ICD10-I70.0). Electronically Signed   By: MRanda NgoM.D.   On: 01/13/2023 14:52     Discharge Exam: Vitals:   01/22/23 0527 01/22/23 0823  BP: (!) 150/73 132/65  Pulse: 86 69  Resp: 18 20  Temp: 97.6 F (36.4 C) 97.8 F (36.6 C)  SpO2: 97% 94%   Vitals:   01/21/23 1814 01/21/23 2051 01/22/23 0527 01/22/23 0823  BP: 125/72 136/73 (!) 150/73 132/65  Pulse: 89 94 86 69  Resp: '18 18 18 20  '$ Temp: 97.9 F (36.6 C) 98 F (36.7 C) 97.6 F (36.4 C)  97.8 F (36.6 C)  TempSrc:  Oral    SpO2: 96% 95% 97% 94%  Weight:      Height:        General: Pt is alert, awake, not in acute distress Cardiovascular: RRR, S1/S2 +, no rubs, no gallops Respiratory: CTA bilaterally, no wheezing, no rhonchi Abdominal: Soft, NT, ND, bowel sounds + Extremities: no edema, no cyanosis    The results of significant diagnostics from this hospitalization (including imaging, microbiology, ancillary and laboratory) are listed below for reference.     Microbiology:  Recent Results (from the past 240 hour(s))  Resp panel by RT-PCR (RSV, Flu A&B, Covid) Anterior Nasal Swab     Status: None   Collection Time: 01/13/23  4:40 PM   Specimen: Anterior Nasal Swab  Result Value Ref Range Status   SARS Coronavirus 2 by RT PCR NEGATIVE NEGATIVE Final    Comment: (NOTE) SARS-CoV-2 target nucleic acids are NOT DETECTED.  The SARS-CoV-2 RNA is generally detectable in upper respiratory specimens during the acute phase of infection. The lowest concentration of SARS-CoV-2 viral copies this assay can detect is 138 copies/mL. A negative result does not preclude SARS-Cov-2 infection and should not be used as the sole basis for treatment or other patient management decisions. A negative result may occur with  improper specimen collection/handling, submission of specimen other than nasopharyngeal swab, presence of viral mutation(s) within the areas targeted by this assay, and inadequate number of viral copies(<138 copies/mL). A negative result must be combined with clinical observations, patient history, and epidemiological information. The expected result is Negative.  Fact Sheet for Patients:  EntrepreneurPulse.com.au  Fact Sheet for Healthcare Providers:  IncredibleEmployment.be  This test is no t yet approved or cleared by the [place] FDA and  has been authorized for detection and/or diagnosis of SARS-CoV-2 by FDA under  an Emergency Use Authorization (EUA). This EUA will remain  in effect (meaning this test can be used) for the duration of the COVID-19 declaration under Section 564(b)(1) of the Act, 21 U.S.C.section 360bbb-3(b)(1), unless the authorization is terminated  or revoked sooner.       Influenza A by PCR NEGATIVE NEGATIVE Final   Influenza B by PCR NEGATIVE NEGATIVE Final    Comment: (NOTE) The Xpert Xpress SARS-CoV-2/FLU/RSV plus assay is intended as an aid in the diagnosis of influenza from Nasopharyngeal swab specimens and should not be used as a sole basis for treatment. Nasal washings and aspirates are unacceptable for Xpert Xpress SARS-CoV-2/FLU/RSV testing.  Fact Sheet for Patients: EntrepreneurPulse.com.au  Fact Sheet for Healthcare Providers: IncredibleEmployment.be  This test is not yet approved or cleared by the [place] FDA and has been authorized for detection and/or diagnosis of SARS-CoV-2 by FDA under an Emergency Use Authorization (EUA). This EUA will remain in effect (meaning this test can be used) for the duration of the COVID-19 declaration under Section 564(b)(1) of the Act, 21 U.S.C. section 360bbb-3(b)(1), unless the authorization is terminated or revoked.     Resp Syncytial Virus by PCR NEGATIVE NEGATIVE Final    Comment: (NOTE) Fact Sheet for Patients: EntrepreneurPulse.com.au  Fact Sheet for Healthcare Providers: IncredibleEmployment.be  This test is not yet approved or cleared by the [place] FDA and has been authorized for detection and/or diagnosis of SARS-CoV-2 by FDA under an Emergency Use Authorization (EUA). This EUA will remain in effect (meaning this test can be used) for the duration of the COVID-19 declaration under Section 564(b)(1) of the Act, 21 U.S.C. section 360bbb-3(b)(1), unless the authorization is terminated or revoked.  Performed at [facility], [address], [place], [place] [address]   Urine Culture     Status: Abnormal   Collection Time: 01/13/23  7:05 PM   Specimen: PATH Other; Urine  Result Value Ref Range Status   Specimen Description   Final    CYSTOSCOPY Performed at [facility], [place] [address] [place],  [address]    Special Requests   Final    NONE Performed at [facility], [address] [place], [place] [address]    Culture 50,000 COLONIES/mL KLEBSIELLA PNEUMONIAE (A)  Final   Report Status 01/15/2023 FINAL  Final   Organism ID, Bacteria KLEBSIELLA PNEUMONIAE (A)  Final      Susceptibility   Klebsiella pneumoniae - MIC*    AMPICILLIN >=32 RESISTANT Resistant     CEFAZOLIN <=4 SENSITIVE Sensitive     CEFEPIME <=0.12 SENSITIVE Sensitive     CEFTAZIDIME <=1 SENSITIVE Sensitive     CEFTRIAXONE <=0.25 SENSITIVE Sensitive     CIPROFLOXACIN <=0.25 SENSITIVE Sensitive     GENTAMICIN <=1 SENSITIVE Sensitive     IMIPENEM 0.5 SENSITIVE Sensitive     TRIMETH/SULFA <=20 SENSITIVE Sensitive     AMPICILLIN/SULBACTAM 4 SENSITIVE Sensitive     PIP/TAZO <=4 SENSITIVE Sensitive     * 50,000 COLONIES/mL KLEBSIELLA PNEUMONIAE     Labs: BNP (last 3 results) No results for input(s): "BNP" in the last 8760 hours. Basic Metabolic Panel: Recent Labs  Lab 01/18/23 0613 01/19/23 0518 01/20/23 0438 01/21/23 0622 01/22/23 0539  NA 136 136 135 137 137  K 3.7 4.7 4.4 3.9 3.9  CL 106 106 105 107 103  CO2 '23 23 25 24 24  '$ GLUCOSE 99 109* 105* 100* 90  BUN '14 13 13 16 14  '$ CREATININE 1.13* 1.20* 1.21* 1.31* 1.28*  CALCIUM 7.3* 7.4* 7.6* 7.6* 7.7*  MG 1.8 1.9 1.8 1.7 2.2  PHOS 2.1* 3.0 3.5 3.3 3.1   Liver Function Tests: No results for input(s): "AST", "ALT", "ALKPHOS", "BILITOT", "PROT", "ALBUMIN" in the last 168 hours. No results for input(s): "LIPASE", "AMYLASE" in the last 168 hours. No results for input(s): "AMMONIA" in the last 168 hours. CBC: Recent Labs  Lab 01/18/23 0613  01/19/23 0518 01/20/23 0438 01/21/23 0622 01/22/23 0539  WBC 6.2 7.8 8.9 7.0 9.5  HGB 8.0* 8.2* 8.1* 7.1* 10.1*  HCT 26.1* 26.5* 26.4* 23.4* 32.7*  MCV 82.3 82.6 82.8 83.6 82.2  PLT 225 230 226 205 275   Cardiac Enzymes: No results for input(s): "CKTOTAL", "CKMB", "CKMBINDEX", "TROPONINI" in the last 168 hours. BNP: Invalid input(s): "POCBNP" CBG: No results for input(s): "GLUCAP" in the last 168 hours. D-Dimer No results for input(s): "DDIMER" in the last 72 hours. Hgb A1c No results for input(s): "HGBA1C" in the last 72 hours. Lipid Profile No results for input(s): "CHOL", "HDL", "LDLCALC", "TRIG", "CHOLHDL", "LDLDIRECT" in the last 72 hours. Thyroid function studies No results for input(s): "TSH", "T4TOTAL", "T3FREE", "THYROIDAB" in the last 72 hours.  Invalid input(s): "FREET3" Anemia work up No results for input(s): "VITAMINB12", "FOLATE", "FERRITIN", "TIBC", "IRON", "RETICCTPCT" in the last 72 hours. Urinalysis    Component Value Date/Time   COLORURINE AMBER (A) 01/13/2023 1545   APPEARANCEUR CLOUDY (A) 01/13/2023 1545   APPEARANCEUR Cloudy (A) 09/17/2022 1347   LABSPEC 1.044 (H) 01/13/2023 1545   PHURINE 6.0 01/13/2023 1545   GLUCOSEU NEGATIVE 01/13/2023 1545   HGBUR LARGE (A) 01/13/2023 1545   BILIRUBINUR NEGATIVE 01/13/2023 1545   BILIRUBINUR Negative 08/19/2022 1014   KETONESUR 5 (A) 01/13/2023 1545   PROTEINUR 100 (A) 01/13/2023 1545   UROBILINOGEN 0.2 02/09/2021 1153   NITRITE NEGATIVE 01/13/2023 1545   LEUKOCYTESUR MODERATE (A) 01/13/2023 1545   Sepsis Labs Recent Labs  Lab 01/19/23 0518 01/20/23 0438 01/21/23 0622 01/22/23 0539  WBC 7.8 8.9 7.0 9.5   Microbiology Recent Results (from the past 240 hour(s))  Resp panel by RT-PCR (RSV, Flu A&B, Covid) Anterior Nasal Swab     Status: None  Collection Time: 01/13/23  4:40 PM   Specimen: Anterior Nasal Swab  Result Value Ref Range Status   SARS Coronavirus 2 by RT PCR NEGATIVE NEGATIVE Final     Comment: (NOTE) SARS-CoV-2 target nucleic acids are NOT DETECTED.  The SARS-CoV-2 RNA is generally detectable in upper respiratory specimens during the acute phase of infection. The lowest concentration of SARS-CoV-2 viral copies this assay can detect is 138 copies/mL. A negative result does not preclude SARS-Cov-2 infection and should not be used as the sole basis for treatment or other patient management decisions. A negative result may occur with  improper specimen collection/handling, submission of specimen other than nasopharyngeal swab, presence of viral mutation(s) within the areas targeted by this assay, and inadequate number of viral copies(<138 copies/mL). A negative result must be combined with clinical observations, patient history, and epidemiological information. The expected result is Negative.  Fact Sheet for Patients:  EntrepreneurPulse.com.au  Fact Sheet for Healthcare Providers:  IncredibleEmployment.be  This test is no t yet approved or cleared by the [place] FDA and  has been authorized for detection and/or diagnosis of SARS-CoV-2 by FDA under an Emergency Use Authorization (EUA). This EUA will remain  in effect (meaning this test can be used) for the duration of the COVID-19 declaration under Section 564(b)(1) of the Act, 21 U.S.C.section 360bbb-3(b)(1), unless the authorization is terminated  or revoked sooner.       Influenza A by PCR NEGATIVE NEGATIVE Final   Influenza B by PCR NEGATIVE NEGATIVE Final    Comment: (NOTE) The Xpert Xpress SARS-CoV-2/FLU/RSV plus assay is intended as an aid in the diagnosis of influenza from Nasopharyngeal swab specimens and should not be used as a sole basis for treatment. Nasal washings and aspirates are unacceptable for Xpert Xpress SARS-CoV-2/FLU/RSV testing.  Fact Sheet for Patients: EntrepreneurPulse.com.au  Fact Sheet for Healthcare  Providers: IncredibleEmployment.be  This test is not yet approved or cleared by the [place] FDA and has been authorized for detection and/or diagnosis of SARS-CoV-2 by FDA under an Emergency Use Authorization (EUA). This EUA will remain in effect (meaning this test can be used) for the duration of the COVID-19 declaration under Section 564(b)(1) of the Act, 21 U.S.C. section 360bbb-3(b)(1), unless the authorization is terminated or revoked.     Resp Syncytial Virus by PCR NEGATIVE NEGATIVE Final    Comment: (NOTE) Fact Sheet for Patients: EntrepreneurPulse.com.au  Fact Sheet for Healthcare Providers: IncredibleEmployment.be  This test is not yet approved or cleared by the [place] FDA and has been authorized for detection and/or diagnosis of SARS-CoV-2 by FDA under an Emergency Use Authorization (EUA). This EUA will remain in effect (meaning this test can be used) for the duration of the COVID-19 declaration under Section 564(b)(1) of the Act, 21 U.S.C. section 360bbb-3(b)(1), unless the authorization is terminated or revoked.  Performed at [facility], [address], [place], [place] [address]   Urine Culture     Status: Abnormal   Collection Time: 01/13/23  7:05 PM   Specimen: PATH Other; Urine  Result Value Ref Range Status   Specimen Description   Final    CYSTOSCOPY Performed at [facility] [facility], [place] [address] [place], [place] [address]    Special Requests   Final    NONE Performed at [facility], [place], [place] [address]    Culture 50,000 COLONIES/mL KLEBSIELLA PNEUMONIAE (A)  Final   Report Status 01/15/2023 FINAL  Final   Organism ID,  Bacteria KLEBSIELLA PNEUMONIAE (A)  Final      Susceptibility   Klebsiella pneumoniae - MIC*    AMPICILLIN >=32 RESISTANT Resistant     CEFAZOLIN <=4 SENSITIVE Sensitive     CEFEPIME <=0.12 SENSITIVE Sensitive      CEFTAZIDIME <=1 SENSITIVE Sensitive     CEFTRIAXONE <=0.25 SENSITIVE Sensitive     CIPROFLOXACIN <=0.25 SENSITIVE Sensitive     GENTAMICIN <=1 SENSITIVE Sensitive     IMIPENEM 0.5 SENSITIVE Sensitive     TRIMETH/SULFA <=20 SENSITIVE Sensitive     AMPICILLIN/SULBACTAM 4 SENSITIVE Sensitive     PIP/TAZO <=4 SENSITIVE Sensitive     * 50,000 COLONIES/mL KLEBSIELLA PNEUMONIAE     Time coordinating discharge: Over 30 minutes  SIGNED:   Darliss Cheney, MD  [facility] 01/22/2023, 8:55 AM *Please note that this is a verbal dictation therefore any spelling or grammatical errors are due to the "[employer]" system interpretation. If 7PM-7AM, please contact night-coverage [email or web address]

## 2023-01-22 NOTE — Progress Notes (Signed)
Patient is being discharged home. Discharge papers given and explained to patient and spouse. Both verbalized understanding. Medications and f/u appointments reviewed. Rx was sent electronically to the pharmacy. Patient made aware.

## 2023-01-24 ENCOUNTER — Telehealth: Payer: Self-pay

## 2023-01-24 NOTE — Telephone Encounter (Signed)
Called patient to schedule next OR procedure since being discharge. Did leave detailed message to call back and get scheduled. Will try again.

## 2023-01-25 ENCOUNTER — Telehealth: Payer: Self-pay | Admitting: *Deleted

## 2023-01-25 ENCOUNTER — Other Ambulatory Visit: Payer: Self-pay

## 2023-01-25 DIAGNOSIS — I4891 Unspecified atrial fibrillation: Secondary | ICD-10-CM

## 2023-01-25 NOTE — Telephone Encounter (Signed)
I spoke with Tara Marquez. We have discussed possible surgery dates and Tuesday February 13th, 2024 was agreed upon by all parties. Patient given information about surgery date, what to expect pre-operatively and post operatively.  We discussed that a Pre-Admission Testing office will be calling to set up the pre-op visit that will take place prior to surgery, and that these appointments are typically done over the phone with a Pre-Admissions RN. Informed patient that our office will communicate any additional care to be provided after surgery. Patients questions or concerns were discussed during our call. Advised to call our office should there be any additional information, questions or concerns that arise. Patient verbalized understanding.

## 2023-01-25 NOTE — Progress Notes (Signed)
   REQUEST FOR SURGICAL CLEARANCE Phone Number: [phone] for Surgical Coordinator Fax Number: [phone]      Date: Date: 01/25/23  Faxed to: Leroy Libman with [facility]  Surgeon: Dr. John Giovanni, MD     Date of Surgery: 02/08/2023  Operation: Left Ureteroscopy with laser lithotripsy and stent placement   Anesthesia Type: General   Diagnosis: Left Ureteral Stone  Patient Requires:   Cardiac / Vascular Clearance : Yes  Reason: Patient will need to hold Eliquis prior to surgery    Risk Assessment:    Low   '[]'$       Moderate   '[]'$     High   '[]'$           This patient is optimized for surgery  YES '[]'$       NO   '[]'$    I recommend further assessment/workup prior to surgery. YES '[]'$      NO  '[]'$   Appointment scheduled for: _______________________   Further recommendations: ____________________________________     Physician Signature:__________________________________   Printed Name: ________________________________________   Date: _________________

## 2023-01-25 NOTE — Progress Notes (Signed)
   [facility] From  Surgery Date: Date: 02/08/2023  Surgeon: Dr. John Giovanni, MD  Inpt ( No  )   Outpt (Yes)   Obs ( No  )   Diagnosis: N20.1 Left Ureteral Stone  -CPT: [phone]  Surgery: Left Ureteroscopy with Laser Lithotripsy and Stent Exchange  Stop Anticoagulations: Yes  Cardiac/Medical/Pulmonary Clearance needed: yes  Clearance needed from Dr: Leroy Libman with [place] request sent on: Date: 01/25/23  *Orders entered into EPIC  Date: 01/25/23   *Case booked in EPIC  Date: 01/25/23  *Notified pt of Surgery: Date: 01/25/23  PRE-OP UA & CX: no  *Placed into Prior Authorization Work [place] Date: 01/25/23  Assistant/laser/rep:No

## 2023-01-25 NOTE — Progress Notes (Signed)
  Care Coordination   Note   01/25/2023 Name: Tara Marquez MRN: [id number] DOB: [birth year]  Tara Marquez is a 82 y.o. year old female who sees Eulas Post, MD for primary care. I reached out to [name] by phone today to offer care coordination services.  Ms. Hoke was given information about Care Coordination services today including:   The Care Coordination services include support from the care team which includes your Nurse Coordinator, Clinical Social Worker, or Pharmacist.  The Care Coordination team is here to help remove barriers to the health concerns and goals most important to you. Care Coordination services are voluntary, and the patient may decline or stop services at any time by request to their care team member.   Care Coordination Consent Status: Patient agreed to services and verbal consent obtained.   Follow up plan:  Telephone appointment with care coordination team member scheduled for:  01/28/2023  Encounter Outcome:  Pt. Scheduled from referral   Julian Hy, [place] Direct Dial: [phone]

## 2023-01-25 NOTE — Patient Outreach (Signed)
  Care Coordination [facility] Note Transition Care Management Unsuccessful Follow-up Telephone Call  Date of discharge and from where:  [facility] [id number] obstructive uropathy  Attempts:  1st Attempt   Reason for unsuccessful TCM follow-up call:  Left voice message  [place] Management [phone]

## 2023-01-26 ENCOUNTER — Telehealth: Payer: Self-pay | Admitting: *Deleted

## 2023-01-26 NOTE — Patient Outreach (Signed)
  Care Coordination [facility] Note Transition Care Management Unsuccessful Follow-up Telephone Call  Date of discharge and from where:  [facility] [phone]  Attempts:  2nd Attempt  Reason for unsuccessful TCM follow-up call:  Left voice message  [place] Care Management [phone]

## 2023-01-28 ENCOUNTER — Telehealth: Payer: Self-pay

## 2023-01-28 ENCOUNTER — Ambulatory Visit: Payer: Self-pay | Admitting: *Deleted

## 2023-01-28 NOTE — Patient Outreach (Signed)
  Care Coordination   Initial Visit Note   01/28/2023 Name: Tara Marquez MRN: [id number] DOB: [birth year]  Tara Marquez is a 82 y.o. year old female who sees Eulas Post, MD for primary care. I spoke with  [name] by phone today.  What matters to the patients health and wellness today?  Hospitalized 1/18-1/27 for ureter stones, will have lithotripsy on 2/13.    Goals Addressed             This Visit's Progress    Resolution of Ureter Stones       Care Coordination Interventions: Evaluation of current treatment plan related to ureter stones and patient's adherence to plan as established by provider Advised patient to drink fluids in effort to decrease risk of dehydration Provided education to patient re: lithotripsy Collaborated with PCP office regarding Patient complaint of nausea and request for Zofran.  Husband state patient has been using his old prescription with relief Reviewed scheduled/upcoming provider appointments including 2/7 with PCP and 2/8 for preadmission testing. Procedure scheduled for 2/13  Discussed plans with patient for ongoing care management follow up and provided patient with direct contact information for care management team Screening for signs and symptoms of depression related to chronic disease state  Assessed social determinant of health barriers Discussed nutrition with patient and husband, encouraged to drink ensure if not eating, she does not like ensure.  Husband interested in the best multivitamin that will provide nutrients needed, will discuss with provider        Interventions Today    Flowsheet Row Most Recent Value  Chronic Disease Discussed/Reviewed   Chronic disease discussed/reviewed during today's visit Other  [Ureter Stones]  General Interventions   General Interventions Discussed/Reviewed General Interventions Discussed  Education Interventions   Education Provided Provided Verbal Education  Provided Verbal Education  On Nutrition, Medication, When to see the doctor  Nutrition Interventions   Nutrition Discussed/Reviewed Nutrition Discussed, Supplmental nutrition        SDOH assessments and interventions completed:  Yes     Care Coordination Interventions:  Yes, provided   Follow up plan: Follow up call scheduled for 2/16    Encounter Outcome:  Pt. Visit Completed   Valente David, RN, MSN, [place] Care Management Care Management Coordinator [phone]

## 2023-01-28 NOTE — Patient Outreach (Signed)
  Care Coordination [facility] Note Transition Care Management Follow-up Telephone Call Date of discharge and from where: [facility] 01/22/23 How have you been since you were released from the hospital? "I am feeling okay" Any questions or concerns? No  Items Reviewed: Did the pt receive and understand the discharge instructions provided? Yes  Medications obtained and verified? Yes  Other? No  Any new allergies since your discharge? No  Dietary orders reviewed? Yes Do you have support at home? Yes   Home Care and Equipment/Supplies: Were home health services ordered? no If so, what is the name of the agency? N/a  Has the agency set up a time to come to the patient's home? not applicable Were any new equipment or medical supplies ordered?  Yes: [employer] What is the name of the medical supply agency? [employer] Were you able to get the supplies/equipment? yes Do you have any questions related to the use of the equipment or supplies? No  Functional Questionnaire: (I = Independent and D = Dependent) ADLs: I  Bathing/Dressing- I  Meal Prep- I  Eating- I  Maintaining continence- I  Transferring/Ambulation- I  Managing Meds- I  Follow up appointments reviewed:  PCP Hospital f/u appt confirmed? No  Patient having lithotripsy next week [facility] f/u appt confirmed? Yes  Scheduled to see Rew on 02/03/23 @ 0900. Are transportation arrangements needed? No  If their condition worsens, is the pt aware to call PCP or go to the Emergency Dept.? No Was the patient provided with contact information for the PCP's office or ED? No Was to pt encouraged to call back with questions or concerns? No  SDOH assessments and interventions completed:   Yes SDOH Interventions Today    Flowsheet Row Most Recent Value  SDOH Interventions   Food Insecurity Interventions Intervention Not Indicated  Housing Interventions Intervention Not Indicated       Care Coordination Interventions:  Referred for Care  Coordination Services:  RN Care Coordinator   Encounter Outcome:  Pt. Visit Completed

## 2023-01-28 NOTE — Patient Instructions (Signed)
Visit Information  Thank you for taking time to visit with me today. Please don't hesitate to contact me if I can be of assistance to you before our next scheduled telephone appointment.  Following are the goals we discussed today:  Listen for call from PCP office regarding medication for nausea  Our next appointment is by telephone on 2/16  Please call the care guide team at [phone] if you need to cancel or reschedule your appointment.   Please call the Suicide and Crisis Lifeline: 988 call the [place] National Suicide Prevention Lifeline: 564-283-8775 or TTY: (424)166-2576 TTY 406-875-5946) to talk to a trained counselor call 1-800-273-TALK (toll free, 24 hour hotline) call 911 if you are experiencing a Mental Health or [place] or need someone to talk to.  Patient verbalizes understanding of instructions and care plan provided today and agrees to view in [place]. Active MyChart status and patient understanding of how to access instructions and care plan via MyChart confirmed with patient.     The patient has been provided with contact information for the care management team and has been advised to call with any health related questions or concerns.   Valente David, RN, MSN, [place] Care Management Care Management Coordinator [phone]

## 2023-02-01 IMAGING — CR DG SHOULDER 2+V*L*
1 series · 3 of 3 positions shown · non-contrast
Comparison: None.

CLINICAL DATA: Bilateral shoulder pain.

EXAM:
LEFT SHOULDER - 2+ VIEW; RIGHT SHOULDER - 2+ VIEW

[Series 1: dg shoulder left · 0.14mm/px · 3 of 3 slices shown]
[im 1/3]
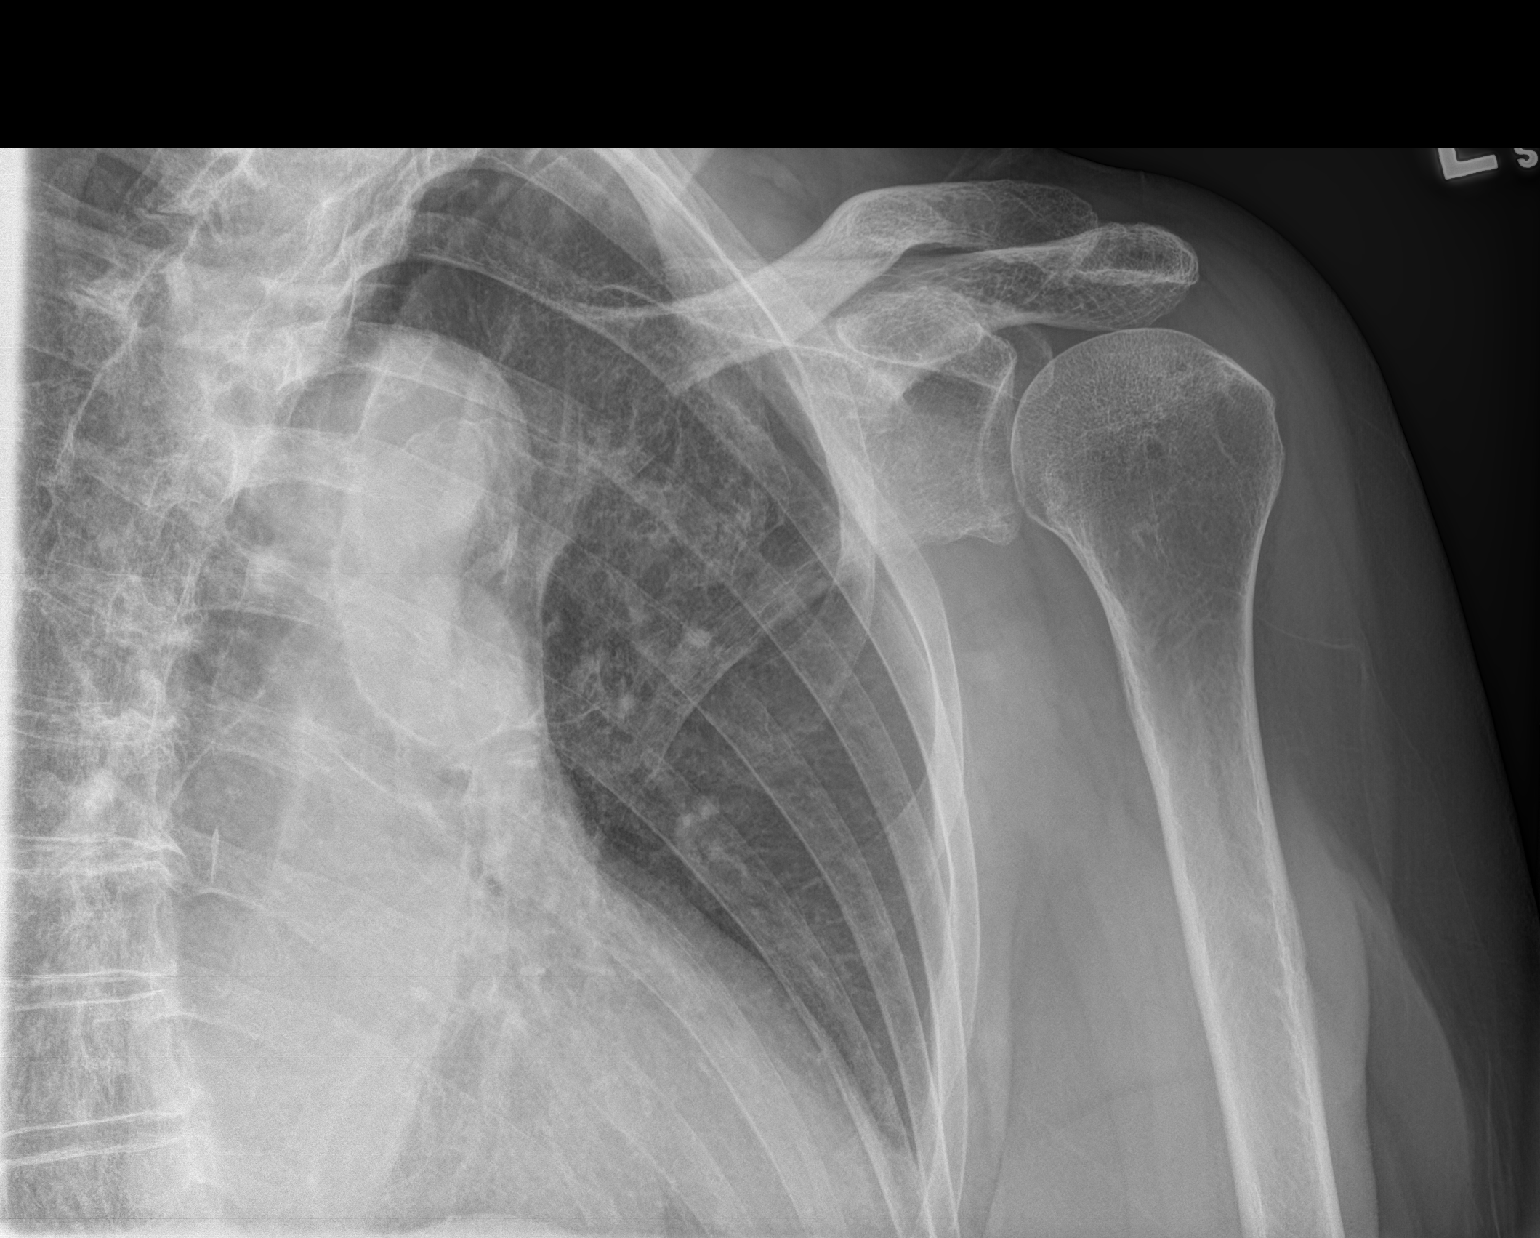
[im 2/3]
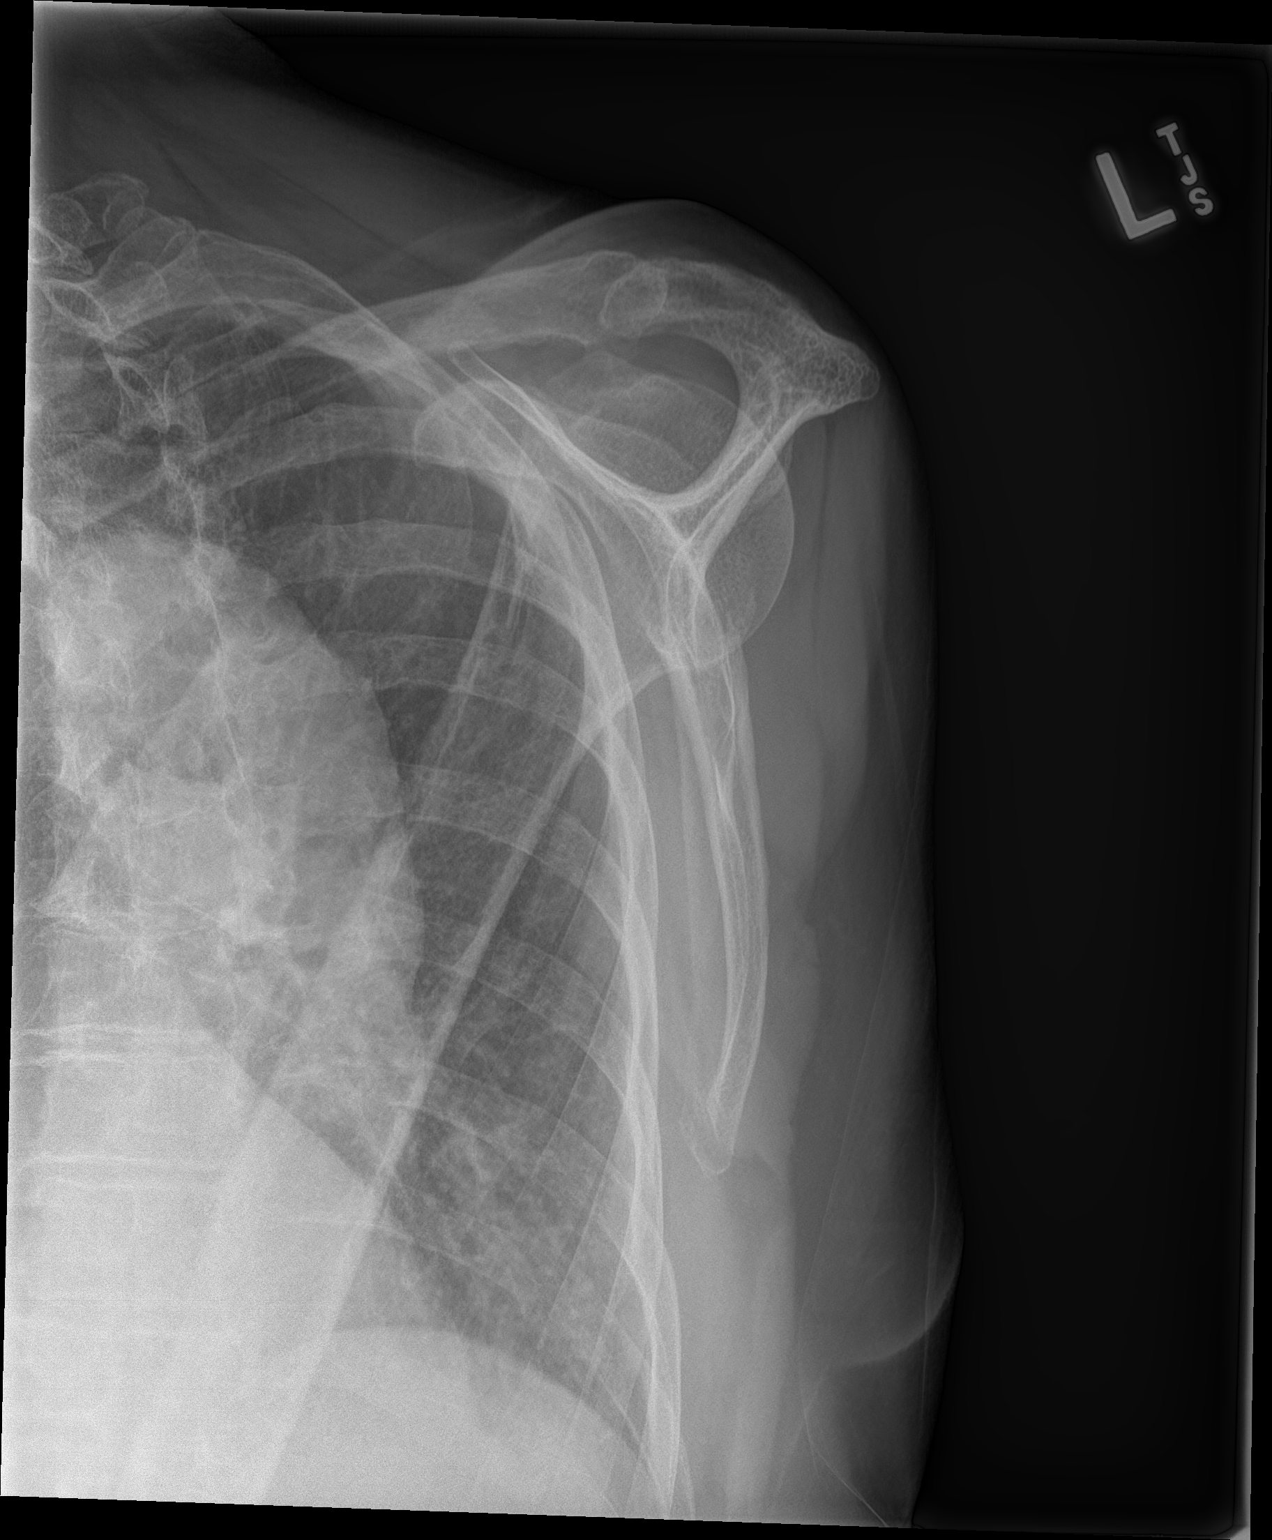
[im 3/3]
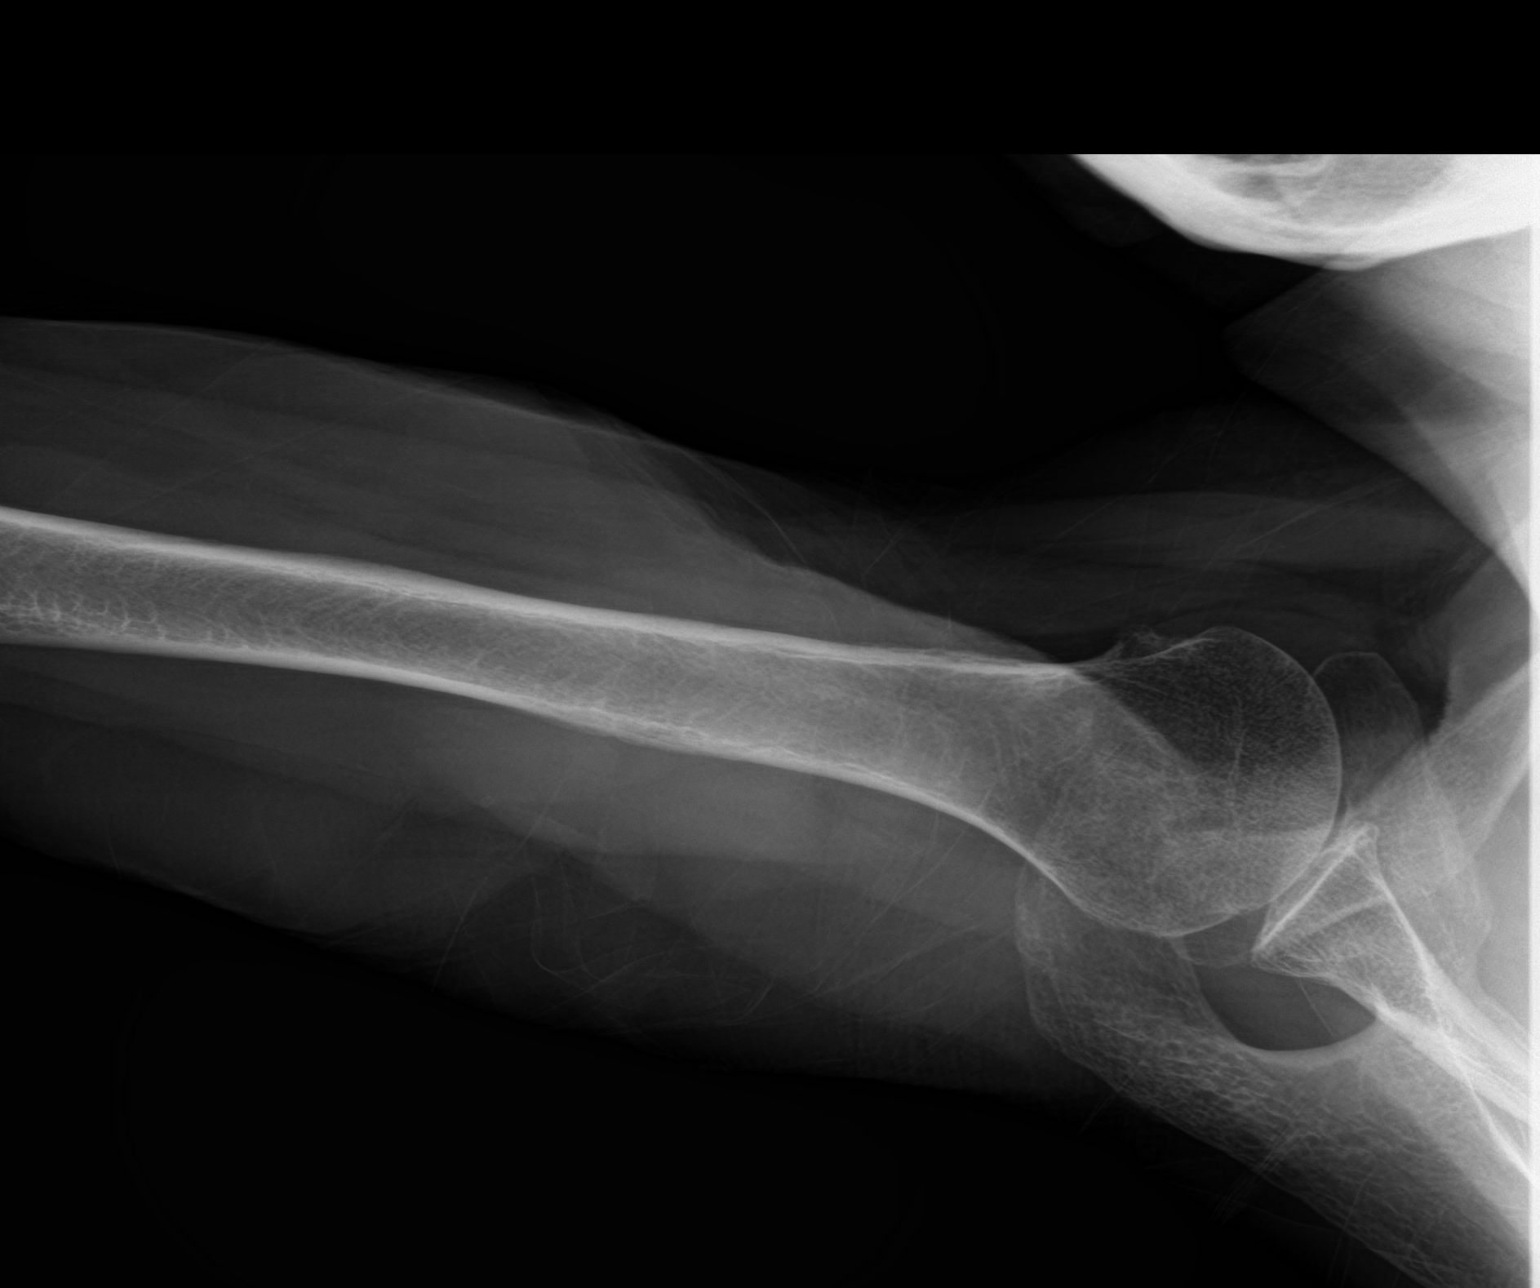

[3 of 3 positions shown; findings below may reference images not displayed]

FINDINGS: There is no acute fracture or dislocation. The bones are mildly
osteopenic. There is mild bilateral shoulder arthritic changes with
mild irregularity of the bony glenoid and mild joint space
narrowing. The soft tissues are unremarkable.
IMPRESSION: 1. No acute fracture or dislocation.
2. Mild bilateral arthritic changes.

## 2023-02-02 DIAGNOSIS — D509 Iron deficiency anemia, unspecified: Secondary | ICD-10-CM | POA: Diagnosis not present

## 2023-02-02 DIAGNOSIS — N200 Calculus of kidney: Secondary | ICD-10-CM | POA: Diagnosis not present

## 2023-02-02 DIAGNOSIS — I10 Essential (primary) hypertension: Secondary | ICD-10-CM | POA: Diagnosis not present

## 2023-02-02 DIAGNOSIS — E538 Deficiency of other specified B group vitamins: Secondary | ICD-10-CM | POA: Diagnosis not present

## 2023-02-02 DIAGNOSIS — E782 Mixed hyperlipidemia: Secondary | ICD-10-CM | POA: Diagnosis not present

## 2023-02-02 DIAGNOSIS — N10 Acute tubulo-interstitial nephritis: Secondary | ICD-10-CM | POA: Diagnosis not present

## 2023-02-03 ENCOUNTER — Encounter: Payer: Self-pay | Admitting: Urology

## 2023-02-03 ENCOUNTER — Encounter
Admission: RE | Admit: 2023-02-03 | Discharge: 2023-02-03 | Disposition: A | Discharge status: Home / Self Care | Payer: Medicare Other | Source: Ambulatory Visit | Attending: Urology | Admitting: Urology

## 2023-02-03 HISTORY — DX: Irritable bowel syndrome, unspecified: K58.9

## 2023-02-03 HISTORY — DX: Polymyalgia rheumatica: M35.3

## 2023-02-03 HISTORY — DX: Ventricular premature depolarization: I49.3

## 2023-02-03 HISTORY — DX: Hypokalemia: E87.6

## 2023-02-03 HISTORY — DX: Acute pyelonephritis: N10

## 2023-02-03 NOTE — Progress Notes (Signed)
Perioperative / Anesthesia Services  Pre-Admission Testing Clinical Review / Preoperative Anesthesia Consult  Date: 02/03/23  Patient Demographics:  Name: Tara Marquez DOB:   11/16/1941 MRN:   [id number]  Planned Surgical Procedure(s):    Case: [id number] Date/Time: 02/08/23 1401   Procedure: CYSTOSCOPY/URETEROSCOPY/HOLMIUM LASER/STENT EXCHANGE (Left)   Anesthesia type: General   Pre-op diagnosis: Left Ureteral Stone   Location: [facility] OR ROOM 10 / [place] [facility]   Surgeons: Abbie Sons, MD   NOTE: Available PAT nursing documentation and vital signs have been reviewed. Clinical nursing staff has updated patient's PMH/PSHx, current medication list, and drug allergies/intolerances to ensure comprehensive history available to assist in medical decision making as it pertains to the aforementioned surgical procedure and anticipated anesthetic course. Extensive review of available clinical information personally performed. Tara Marquez PMH and PSHx updated with any diagnoses/procedures that  may have been inadvertently omitted during her intake with the pre-admission testing department's nursing staff.  Clinical Discussion:  Tara Marquez is a 82 y.o. female who is submitted for pre-surgical anesthesia review and clearance prior to her undergoing the above procedure. Patient has never been a smoker. Pertinent PMH includes: CAD, atrial fibrillation, descending thoracic aortic aneurysm, frequent PVCs, aortic atherosclerosis, HTN, HLD, hypothyroidism, GERD (on daily PPI), anemia, polymyalgia rheumatica, splenomegaly, depression.  Patient is followed by cardiology Tara Massed, MD). She was last seen in the cardiology clinic on 01/04/2023; notes reviewed. At the time of her clinic visit, patient doing well overall from a cardiovascular perspective. Patient denied any chest pain, shortness of breath, PND, orthopnea, significant peripheral edema, weakness, fatigue, vertiginous symptoms, or  presyncope/syncope.  Patient reported fatigue related to her underlying anemia. Patient with a past medical history significant for cardiovascular diagnoses. Documented physical exam was grossly benign, providing no evidence of acute exacerbation and/or decompensation of the patient's known cardiovascular conditions.  Most recent TTE was performed on 05/28/2021 revealing a low normal left ventricular systolic function with a low normal LVEF of 50%.  There were no regional wall motion abnormalities.  Right ventricular size and function normal.  There was mild mitral, tricuspid, and pulmonary valve regurgitation.  All transvalvular gradients were noted to be normal providing no evidence suggestive of valvular stenosis.  Patient with known descending thoracic aortic aneurysm.  Last imaging was obtained on 09/29/2022, at which time aneurysmal defect measured 4.9 cm.  There was concern for tandem short segment dissection versus penetrating atheromatous ulcers at that time.  Patient with an atrial fibrillation diagnosis; CHA2DS2-VASc Score = 5 (age x 2, sex, HTN, vascular disease history). Her rate and rhythm are currently being maintained on oral amiodarone + metoprolol succinate.  Discussed potential LAA closure device placement for definitive treatment of atrial fibrillation diagnosis.  She is chronically anticoagulated using dose rivaroxaban.  Patient reporting worsening anemia requiring iron transfusions.  She wished to discuss transition from rivaroxaban to another DOAC.  Blood pressure was reasonably controlled at 130/60 mmHg on currently prescribed interventions being used for both her hypertension and atrial fibrillation diagnosis (amiodarone + metoprolol succinate).  Patient was on atorvastatin for her HLD diagnosis and further ASCVD prevention.  She was not diabetic. Patient does not have an OSAH diagnosis.  Functional capacity somewhat limited by age, fatigue associated with anemia, and underlying  medical comorbidities.  With that being said, patient still felt to be able to achieve at least 4 METS of physical activity without experiencing any significant degree of angina/anginal equivalent symptoms.  Rivaroxaban was discontinued and patient  was started on apixaban.  No other changes were made to her medication regimen.  Patient to follow-up with outpatient cardiology for ongoing management and treatment of her underlying cardiovascular diagnoses.  Tara Marquez is scheduled for a CYSTOSCOPY/URETEROSCOPY/HOLMIUM LASER/STENT EXCHANGE (Left) on 02/08/2022 with Dr. John Giovanni, MD.  Given patient's past medical history significant for cardiovascular diagnoses, presurgical cardiac clearance was sought by the PAT team. Per cardiology, "this patient is optimized for surgery and may proceed with the planned procedural course with a LOW risk of significant perioperative cardiovascular complications". Again, this patient is on daily oral anticoagulation therapy. She has been instructed on recommendations for holding her apixaban for 3 days prior to her procedure with plans to restart as soon as postoperative bleeding risk felt to be minimized by her attending surgeon. The patient has been instructed that her last dose of her apixaban should be on 02/04/2023.  Patient denies previous perioperative complications with anesthesia in the past. In review of the available records, it is noted that patient underwent a general anesthetic course here at [facility] ([facility] in 12/2022 without documented complications.      02/03/2023   12:00 PM 01/22/2023    8:23 AM 01/22/2023    5:27 AM  Vitals with BMI  Height '5\' 5"'$     Weight 146 lbs 6 oz    BMI 51.76    Systolic  160 737  Diastolic  65 73  Pulse  69 86    Providers/Specialists:   NOTE: Primary physician provider listed below. Patient may have been seen by APP or partner within same practice.   PROVIDER ROLE / SPECIALTY  LAST Claud Kelp, MD Urology (Surgeon) 01/14/2023  Eulas Post, MD Primary Care Provider 02/02/2023  Serafina Royals, MD Cardiology 01/04/2023   Allergies:  Sulfa antibiotics, Amoxicillin, and Penicillins  Current Home Medications:   No current facility-administered medications for this encounter.    amiodarone (PACERONE) 200 MG tablet   apixaban (ELIQUIS) 2.5 MG TABS tablet   atorvastatin (LIPITOR) 40 MG tablet   cyanocobalamin 1000 MCG tablet   levothyroxine (SYNTHROID) 75 MCG tablet   metoprolol succinate (TOPROL-XL) 25 MG 24 hr tablet   nitroGLYCERIN (NITROSTAT) 0.4 MG SL tablet   ondansetron (ZOFRAN) 4 MG tablet   pantoprazole (PROTONIX) 40 MG tablet   sertraline (ZOLOFT) 100 MG tablet   Vitamin D, Ergocalciferol, (DRISDOL) 1.25 MG (50000 UNIT) CAPS capsule   History:   Past Medical History:  Diagnosis Date   Acute pyelonephritis    Adaptive colitis    Anemia    Aortic atherosclerosis (HCC)    Atrial fibrillation and flutter (HCC)    a.) CHA2DS2VASc = 5 (age x 2, sex, HTN, vascular disease history);  b.) rate/rhythm maintained on oral amiodarone + metoprolol succinate; chronically anticoagulated with dose reduced apixaban   CAD (coronary artery disease)    Depression    Descending thoracic aortic aneurysm (HCC)    a.) CT chest 09/29/2022: 4.9 cm; b.) CT abd 01/13/2023: 4.9 cm   Frequent PVCs    GERD (gastroesophageal reflux disease)    Hepatic steatosis    History of 2019 novel coronavirus disease (COVID-19) 12/04/2021   Hyperlipidemia    Hypertension    Hypokalemia    Hypothyroidism    Long term current use of amiodarone    Long term current use of anticoagulant    a.) dose reduced apixaban   Nephrolithiasis    Osteopenia  Polymyalgia rheumatica (HCC)    Skin cancer    Splenomegaly    Temporal arteritis ([place])    Vitamin D deficiency    Past Surgical History:  Procedure Laterality Date   ABDOMINAL HYSTERECTOMY  03/1971   Status Post  Hysterectomy   CATARACT EXTRACTION Left    COLONOSCOPY     2010   COLONOSCOPY WITH PROPOFOL N/A 01/12/2022   Procedure: COLONOSCOPY WITH PROPOFOL;  Surgeon: Lucilla Lame, MD;  Location: [facility] ENDOSCOPY;  Service: Endoscopy;  Laterality: N/A;   CYSTOSCOPY WITH RETROGRADE PYELOGRAM, URETEROSCOPY AND STENT PLACEMENT Left 01/13/2023   Procedure: CYSTOSCOPY WITH RETROGRADE PYELOGRAM, URETEROSCOPY AND STENT PLACEMENT;  Surgeon: Abbie Sons, MD;  Location: [facility] ORS;  Service: Urology;  Laterality: Left;   ESOPHAGOGASTRODUODENOSCOPY N/A 11/02/2022   Procedure: ESOPHAGOGASTRODUODENOSCOPY (EGD);  Surgeon: Lucilla Lame, MD;  Location: [facility] ENDOSCOPY;  Service: Endoscopy;  Laterality: N/A;   EYE SURGERY     GIVENS CAPSULE STUDY N/A 12/22/2022   Procedure: GIVENS CAPSULE STUDY;  Surgeon: Lucilla Lame, MD;  Location: [facility] ENDOSCOPY;  Service: Endoscopy;  Laterality: N/A;   TONSILLECTOMY     TONSILLECTOMY AND ADENOIDECTOMY  1951   WISDOM TOOTH EXTRACTION  1970   Family History  Problem Relation Age of Onset   Cancer Sister        Has had Breast Cancer and is in remission   Breast cancer Sister 47   Congenital heart disease Son    Social History   Tobacco Use   Smoking status: Never   Smokeless tobacco: Never  Vaping Use   Vaping Use: Never used  Substance Use Topics   Alcohol use: No   Drug use: No    Pertinent Clinical Results:  LABS:     Ref Range & Units 02/02/2023  WBC (White Blood Cell Count) 4.1 - 10.2 10^3/uL 8.4  RBC (Red Blood Cell Count) 4.04 - 5.48 10^6/uL 4.18  Hemoglobin 12.0 - 15.0 gm/dL 10.7 Low   Hematocrit 35.0 - 47.0 % 34.9 Low   MCV (Mean Corpuscular Volume) 80.0 - 100.0 fl 83.5  MCH (Mean Corpuscular Hemoglobin) 27.0 - 31.2 pg 25.6 Low   MCHC (Mean Corpuscular Hemoglobin Concentration) 32.0 - 36.0 gm/dL 30.7 Low   Platelet Count 150 - 450 10^3/uL 229  RDW-CV (Red Cell Distribution Width) 11.6 - 14.8 % 15.6 High   MPV (Mean Platelet Volume) 9.4 - 12.4 fl 10.1   Neutrophils 1.50 - 7.80 10^3/uL 6.90  Lymphocytes 1.00 - 3.60 10^3/uL 0.80 Low   Mixed Count 0.10 - 0.90 10^3/uL 0.70  Neutrophil % 32.0 - 70.0 % 82.2 High   Lymphocyte % 10.0 - 50.0 % 9.6 Low   Mixed % 3.0 - 14.4 % 8.2  Resulting Agency  [facility] - LAB  Specimen Collected: 02/02/23 10:52   Performed by: [facility] - LAB Last Resulted: 02/02/23 11:09  Received From: [place]  Result Received: 02/03/23 10:55    Ref Range & Units 02/02/2023  Glucose 70 - 110 mg/dL 108  Sodium 136 - 145 mmol/L 143  Potassium 3.6 - 5.1 mmol/L 4.6  Chloride 97 - 109 mmol/L 110 High   Carbon Dioxide (CO2) 22.0 - 32.0 mmol/L 25.6  Urea Nitrogen (BUN) 7 - 25 mg/dL 26 High   Creatinine 0.6 - 1.1 mg/dL 3.2 High   Glomerular Filtration Rate (eGFR) >60 mL/min/1.73sq m 14 Low   Calcium 8.7 - 10.3 mg/dL 8.8  AST 8 - 39 U/L 29  ALT 5 - 38  U/L 18  Alk Phos (alkaline Phosphatase) 34 - 104 U/L 110 High   Albumin 3.5 - 4.8 g/dL 3.3 Low   Bilirubin, Total 0.3 - 1.2 mg/dL 0.9  Protein, Total 6.1 - 7.9 g/dL 6.1  A/G Ratio 1.0 - 5.0 gm/dL 1.2  Resulting Agency  [facility] - LAB  Specimen Collected: 02/02/23 10:52   Performed by: [place]: 02/02/23 17:41  Received From: [place]  Result Received: 02/03/23 10:55     ECG: Date: 01/13/2023 Time ECG obtained: 1839 PM Rate: 108 bpm Rhythm: atrial flutter with variable AV block Axis (leads I and aVF): Normal Intervals: QRS 90 ms. QTc 466 ms. ST segment and T wave changes: Nonspecific T wave abnormality in the inferior leads. Comparison: Previous tracing obtained on 09/23/2022 showed sinus bradycardia at a rate of 56 bpm.   IMAGING / PROCEDURES: DG CHEST PORT 1 VIEW performed on 01/18/2023 Increased volume loss and opacity at the left lung base which could reflect atelectasis or developing infiltrate.  Possible small left pleural effusion. Known  descending thoracic aortic aneurysm with dissection or penetrating ulcer could potentially be related. Consider follow-up chest CTA as clinically warranted.  CT ABDOMEN PELVIS W CONTRAST performed on 01/13/2023 Obstructing 7 mm distal left ureteral calculus, with high-grade left-sided obstructive uropathy. Marked left renal edema, heterogeneous decreased enhancement, and perinephric fat stranding consistent with superimposed left-sided pyelonephritis. No renal abscess. Punctate nonobstructing right renal calculus. Stable 4.9 cm descending thoracic aortic aneurysm. This is incompletely evaluated on this study due to slice selection. Aortic atherosclerosis  CTA CHEST DISSECTION W&/OR WO & GATING performed on 09/29/2022 Tandem short-segment dissections versus penetrating atheromatous ulcers in the descending thoracic aorta with associated 4.9 cm aneurysmal dilatation. Recommend semi-annual imaging followup by CTA or MRA and referral to cardiothoracic surgery if not already obtained. This recommendation follows 2010 ACCF/AHA/AATS/ACR/ASA/SCA/SCAI/SIR/STS/SVM Guidelines for the Diagnosis and Management of Patients With Thoracic Aortic Disease. Circulation. 2010; 121: e266-e36 Coronary calcifications Nonobstructing left urolithiasis  TRANSTHORACIC ECHOCARDIOGRAM performed on 05/28/2021 Low normal left ventricular systolic function with an EF of 50% No regional wall motion abnormalities Right ventricular size and function normal  Mild mitral, tricuspid, and pulmonary valve regurgitation Normal gradients; no valvular stenosis No pericardial effusion  MYOCARDIAL PERFUSION IMAGING STUDY (LEXISCAN) performed on 10/21/2018 Normal left ventricular systolic function with a normal LVEF of 55 to 65%  Normal myocardial thickening and wall motion Left ventricular cavity size normal SPECT images demonstrate homogenous tracer distribution throughout the myocardium No evidence of stress-induced myocardial  ischemia or arrhythmia Normal low risk study  Impression and Plan:  Tara Marquez has been referred for pre-anesthesia review and clearance prior to her undergoing the planned anesthetic and procedural courses. Available labs, pertinent testing, and imaging results were personally reviewed by me in preparation for upcoming operative/procedural course. [facility] medical record has been updated following extensive record review and patient interview with PAT staff.   This patient has been appropriately cleared by cardiology with an overall LOW risk of significant perioperative cardiovascular complications. Based on clinical review performed today (02/03/23), barring any significant acute changes in the patient's overall condition, it is anticipated that she will be able to proceed with the planned surgical intervention. Any acute changes in clinical condition may necessitate her procedure being postponed and/or cancelled. Patient will meet with anesthesia team (MD and/or CRNA) on the day of her procedure for preoperative evaluation/assessment. Questions regarding anesthetic course will be fielded at that time.  Pre-surgical instructions were reviewed with the patient during her PAT appointment, and questions were fielded to satisfaction by PAT clinical staff. She has been instructed on which medications that she will need to hold prior to surgery, as well as the ones that have been deemed safe/appropriate to take of the day of her procedure. As part of the general education provided by PAT, patient made aware both verbally and in writing, that she would need to abstain from the use of any illegal substances during her perioperative course.  She was advised that failure to follow the provided instructions could necessitate case cancellation or result serious perioperative complications up to and including death. Patient encouraged to contact PAT and/or her surgeon's office to discuss any questions or  concerns that may arise prior to surgery; verbalized understanding.   Honor Loh, MSN, APRN, FNP-C, CEN [facility]  Peri-operative Services Nurse Practitioner Phone: [phone] Fax: [phone] 02/03/23 4:27 PM  NOTE: This note has been prepared using Dragon dictation software. Despite my best ability to proofread, there is always the potential that unintentional transcriptional errors may still occur from this process.

## 2023-02-03 NOTE — Patient Instructions (Addendum)
Your procedure is scheduled on:02-08-23 Tuesday Report to the Registration Desk on the 1st floor of the [facility]. To find out your arrival time, please call [phone] between 1PM - 3PM on:02-07-23 Monday If your arrival time is 6:00 am, do not arrive before that time as the [place] entrance doors do not open until 6:00 am.  REMEMBER: Instructions that are not followed completely may result in serious medical risk, up to and including death; or upon the discretion of your surgeon and anesthesiologist your surgery may need to be rescheduled.  Do not eat food OR drink any liquids after midnight the night before surgery.  No gum chewing or hard candies.  One week prior to surgery: Stop Anti-inflammatories (NSAIDS) such as Advil, Aleve, Ibuprofen, Motrin, Naproxen, Naprosyn and Aspirin based products such as Excedrin, Goody's Powder, BC Powder.You may however, take Tylenol if needed for pain up until the day of surgery.  Stop ANY OVER THE COUNTER supplements/vitamins NOW (02-03-23) until after surgery (Vitamin B12)  Stop your apixaban (ELIQUIS) 3 days prior to surgery-Last dose will be on 02-04-23 Friday  TAKE ONLY THESE MEDICATIONS THE MORNING OF SURGERY WITH A SIP OF WATER: -amiodarone (PACERONE)  -levothyroxine (SYNTHROID)  -pantoprazole (PROTONIX)-take one the night before and one on the morning of surgery - helps to prevent nausea after surgery.) -sertraline (ZOLOFT)  -You may take ondansetron (ZOFRAN) for nausea if needed  No Alcohol for 24 hours before or after surgery.  No Smoking including e-cigarettes for 24 hours before surgery.  No chewable tobacco products for at least 6 hours before surgery.  No nicotine patches on the day of surgery.  Do not use any "recreational" drugs for at least a week (preferably 2 weeks) before your surgery.  Please be advised that the combination of cocaine and anesthesia may have negative outcomes, up to and including death. If you test  positive for cocaine, your surgery will be cancelled.  On the morning of surgery brush your teeth with toothpaste and water, you may rinse your mouth with mouthwash if you wish. Do not swallow any toothpaste or mouthwash.  Do not wear jewelry, make-up, hairpins, clips or nail polish.  Do not wear lotions, powders, or perfumes.   Do not shave body hair from the neck down 48 hours before surgery.  Contact lenses, hearing aids and dentures may not be worn into surgery.  Do not bring valuables to the hospital. [facility] is not responsible for any missing/lost belongings or valuables.    Notify your doctor if there is any change in your medical condition (cold, fever, infection).  Wear comfortable clothing (specific to your surgery type) to the hospital.  After surgery, you can help prevent lung complications by doing breathing exercises.  Take deep breaths and cough every 1-2 hours. Your doctor may order a device called an Incentive Spirometer to help you take deep breaths. When coughing or sneezing, hold a pillow firmly against your incision with both hands. This is called "splinting." Doing this helps protect your incision. It also decreases belly discomfort.  If you are being admitted to the hospital overnight, leave your suitcase in the car. After surgery it may be brought to your room.  In case of increased patient census, it may be necessary for you, the patient, to continue your postoperative care in the Same Day Surgery department.  If you are being discharged the day of surgery, you will not be allowed to drive home. You will need a responsible individual  to drive you home and stay with you for 24 hours after surgery.   If you are taking public transportation, you will need to have a responsible individual with you.  Please call the [facility] Dept. at [phone] if you have any questions about these instructions.  Surgery Visitation Policy:  Patients  undergoing a surgery or procedure may have two family members or support persons with them as long as the person is not COVID-19 positive or experiencing its symptoms.   Due to an increase in RSV and influenza rates and associated hospitalizations, children ages 16 and under will not be able to visit patients in [facility]. Masks continue to be strongly recommended.

## 2023-02-04 ENCOUNTER — Encounter: Payer: Self-pay | Admitting: Internal Medicine

## 2023-02-04 ENCOUNTER — Telehealth (INDEPENDENT_AMBULATORY_CARE_PROVIDER_SITE_OTHER): Payer: Self-pay | Admitting: Vascular Surgery

## 2023-02-04 DIAGNOSIS — C44722 Squamous cell carcinoma of skin of right lower limb, including hip: Secondary | ICD-10-CM | POA: Diagnosis not present

## 2023-02-04 NOTE — Telephone Encounter (Signed)
LVM for pt regarding CT that Dr. Lucky Cowboy wanted her to have in 3 months (around 2.14.24). I advised to call radiology scheduling at [phone] and make the appt with them. After making that appt, to please give [place] a call at [phone] to make a CT results appt with Dr. Lucky Cowboy.

## 2023-02-07 MED ORDER — CHLORHEXIDINE GLUCONATE 0.12 % MT SOLN: 15.0000 mL | Freq: Once | OROMUCOSAL | Status: AC

## 2023-02-07 MED ORDER — LACTATED RINGERS IV SOLN: INTRAVENOUS | Status: DC

## 2023-02-07 MED ORDER — CEFAZOLIN SODIUM-DEXTROSE 2-4 GM/100ML-% IV SOLN
2.0000 g | INTRAVENOUS | Status: AC
  Administered 2023-02-08: 2 g via INTRAVENOUS

## 2023-02-07 MED ORDER — ORAL CARE MOUTH RINSE: 15.0000 mL | Freq: Once | OROMUCOSAL | Status: AC

## 2023-02-07 NOTE — Telephone Encounter (Signed)
Left message on voicemail.

## 2023-02-08 ENCOUNTER — Ambulatory Visit: Payer: Medicare Other | Admitting: Urgent Care

## 2023-02-08 ENCOUNTER — Other Ambulatory Visit: Payer: Self-pay

## 2023-02-08 ENCOUNTER — Encounter: Payer: Self-pay | Admitting: Urology

## 2023-02-08 ENCOUNTER — Ambulatory Visit: Payer: Medicare Other

## 2023-02-08 ENCOUNTER — Ambulatory Visit
Admission: RE | Admit: 2023-02-08 | Discharge: 2023-02-08 | Disposition: A | Discharge status: Home / Self Care | Payer: Medicare Other | Attending: Urology | Admitting: Urology

## 2023-02-08 ENCOUNTER — Encounter
Admission: RE | Disposition: A | Discharge status: Home / Self Care | Payer: Self-pay | Source: Home / Self Care | Attending: Urology

## 2023-02-08 DIAGNOSIS — I4891 Unspecified atrial fibrillation: Secondary | ICD-10-CM | POA: Insufficient documentation

## 2023-02-08 DIAGNOSIS — I2510 Atherosclerotic heart disease of native coronary artery without angina pectoris: Secondary | ICD-10-CM | POA: Diagnosis not present

## 2023-02-08 DIAGNOSIS — N201 Calculus of ureter: Secondary | ICD-10-CM | POA: Insufficient documentation

## 2023-02-08 DIAGNOSIS — M353 Polymyalgia rheumatica: Secondary | ICD-10-CM | POA: Insufficient documentation

## 2023-02-08 DIAGNOSIS — D649 Anemia, unspecified: Secondary | ICD-10-CM | POA: Insufficient documentation

## 2023-02-08 DIAGNOSIS — I10 Essential (primary) hypertension: Secondary | ICD-10-CM | POA: Insufficient documentation

## 2023-02-08 DIAGNOSIS — E785 Hyperlipidemia, unspecified: Secondary | ICD-10-CM | POA: Insufficient documentation

## 2023-02-08 DIAGNOSIS — F32A Depression, unspecified: Secondary | ICD-10-CM | POA: Diagnosis not present

## 2023-02-08 DIAGNOSIS — E039 Hypothyroidism, unspecified: Secondary | ICD-10-CM | POA: Insufficient documentation

## 2023-02-08 DIAGNOSIS — I739 Peripheral vascular disease, unspecified: Secondary | ICD-10-CM | POA: Insufficient documentation

## 2023-02-08 DIAGNOSIS — Z7901 Long term (current) use of anticoagulants: Secondary | ICD-10-CM | POA: Diagnosis not present

## 2023-02-08 DIAGNOSIS — D759 Disease of blood and blood-forming organs, unspecified: Secondary | ICD-10-CM | POA: Insufficient documentation

## 2023-02-08 DIAGNOSIS — I480 Paroxysmal atrial fibrillation: Secondary | ICD-10-CM | POA: Diagnosis not present

## 2023-02-08 HISTORY — DX: Other long term (current) drug therapy: Z79.899

## 2023-02-08 HISTORY — DX: Unspecified atrial fibrillation: I48.91

## 2023-02-08 HISTORY — PX: CYSTOSCOPY/URETEROSCOPY/HOLMIUM LASER/STENT PLACEMENT: SHX6546

## 2023-02-08 HISTORY — DX: Anemia, unspecified: D64.9

## 2023-02-08 HISTORY — DX: Long term (current) use of anticoagulants: Z79.01

## 2023-02-08 HISTORY — DX: Other giant cell arteritis: M31.6

## 2023-02-08 HISTORY — DX: Unspecified malignant neoplasm of skin, unspecified: C44.90

## 2023-02-08 HISTORY — DX: Atherosclerotic heart disease of native coronary artery without angina pectoris: I25.10

## 2023-02-08 HISTORY — DX: Fatty (change of) liver, not elsewhere classified: K76.0

## 2023-02-08 HISTORY — DX: Vitamin D deficiency, unspecified: E55.9

## 2023-02-08 HISTORY — DX: Splenomegaly, not elsewhere classified: R16.1

## 2023-02-08 HISTORY — DX: Atherosclerosis of aorta: I70.0

## 2023-02-08 HISTORY — DX: Calculus of kidney: N20.0

## 2023-02-08 HISTORY — DX: Other specified disorders of bone density and structure, unspecified site: M85.80

## 2023-02-08 HISTORY — DX: Aneurysm of the descending thoracic aorta, without rupture: I71.23

## 2023-02-08 SURGERY — CYSTOSCOPY/URETEROSCOPY/HOLMIUM LASER/STENT PLACEMENT
Anesthesia: General | Site: Ureter | Laterality: Left

## 2023-02-08 MED ORDER — ACETAMINOPHEN 10 MG/ML IV SOLN
INTRAVENOUS | Status: AC
  Filled 2023-02-08: qty 100

## 2023-02-08 MED ORDER — CEFAZOLIN SODIUM-DEXTROSE 2-4 GM/100ML-% IV SOLN
INTRAVENOUS | Status: AC
  Filled 2023-02-08: qty 100

## 2023-02-08 MED ORDER — PROPOFOL 10 MG/ML IV BOLUS
INTRAVENOUS | Status: DC | PRN
  Administered 2023-02-08: 100 mg via INTRAVENOUS

## 2023-02-08 MED ORDER — FENTANYL CITRATE (PF) 100 MCG/2ML IJ SOLN
INTRAMUSCULAR | Status: DC | PRN
  Administered 2023-02-08: 50 ug via INTRAVENOUS
  Administered 2023-02-08: 25 ug via INTRAVENOUS

## 2023-02-08 MED ORDER — FENTANYL CITRATE (PF) 100 MCG/2ML IJ SOLN
INTRAMUSCULAR | Status: AC
  Filled 2023-02-08: qty 2

## 2023-02-08 MED ORDER — FENTANYL CITRATE (PF) 100 MCG/2ML IJ SOLN: 25.0000 ug | INTRAMUSCULAR | Status: DC | PRN

## 2023-02-08 MED ORDER — CEFUROXIME AXETIL 250 MG PO TABS: 250.0000 mg | ORAL_TABLET | Freq: Two times a day (BID) | ORAL | 0 refills | Status: DC

## 2023-02-08 MED ORDER — LIDOCAINE HCL (CARDIAC) PF 100 MG/5ML IV SOSY
PREFILLED_SYRINGE | INTRAVENOUS | Status: DC | PRN
  Administered 2023-02-08: 40 mg via INTRAVENOUS

## 2023-02-08 MED ORDER — OXYCODONE HCL 5 MG/5ML PO SOLN: 5.0000 mg | Freq: Once | ORAL | Status: DC | PRN

## 2023-02-08 MED ORDER — IOHEXOL 180 MG/ML  SOLN
INTRAMUSCULAR | Status: DC | PRN
  Administered 2023-02-08: 10 mL

## 2023-02-08 MED ORDER — SODIUM CHLORIDE 0.9 % IR SOLN
Status: DC | PRN
  Administered 2023-02-08: 3000 mL

## 2023-02-08 MED ORDER — ONDANSETRON HCL 4 MG/2ML IJ SOLN: 4.0000 mg | Freq: Once | INTRAMUSCULAR | Status: DC | PRN

## 2023-02-08 MED ORDER — EPHEDRINE SULFATE (PRESSORS) 50 MG/ML IJ SOLN
INTRAMUSCULAR | Status: DC | PRN
  Administered 2023-02-08 (×3): 5 mg via INTRAVENOUS
  Administered 2023-02-08: 10 mg via INTRAVENOUS

## 2023-02-08 MED ORDER — ACETAMINOPHEN 10 MG/ML IV SOLN: 1000.0000 mg | Freq: Once | INTRAVENOUS | Status: DC | PRN

## 2023-02-08 MED ORDER — PHENYLEPHRINE 80 MCG/ML (10ML) SYRINGE FOR IV PUSH (FOR BLOOD PRESSURE SUPPORT)
PREFILLED_SYRINGE | INTRAVENOUS | Status: DC | PRN
  Administered 2023-02-08 (×4): 160 ug via INTRAVENOUS
  Administered 2023-02-08 (×2): 80 ug via INTRAVENOUS

## 2023-02-08 MED ORDER — TROSPIUM CHLORIDE 20 MG PO TABS: 20.0000 mg | ORAL_TABLET | Freq: Two times a day (BID) | ORAL | 0 refills | Status: DC | PRN

## 2023-02-08 MED ORDER — DEXAMETHASONE SODIUM PHOSPHATE 10 MG/ML IJ SOLN
INTRAMUSCULAR | Status: DC | PRN
  Administered 2023-02-08: 5 mg via INTRAVENOUS

## 2023-02-08 MED ORDER — OXYCODONE HCL 5 MG PO TABS: 5.0000 mg | ORAL_TABLET | Freq: Once | ORAL | Status: DC | PRN

## 2023-02-08 MED ORDER — CHLORHEXIDINE GLUCONATE 0.12 % MT SOLN
OROMUCOSAL | Status: AC
  Administered 2023-02-08: 15 mL via OROMUCOSAL
  Filled 2023-02-08: qty 15

## 2023-02-08 MED ORDER — ONDANSETRON HCL 4 MG/2ML IJ SOLN
INTRAMUSCULAR | Status: DC | PRN
  Administered 2023-02-08: 4 mg via INTRAVENOUS

## 2023-02-08 MED ORDER — LACTATED RINGERS IV SOLN: INTRAVENOUS | Status: DC

## 2023-02-08 SURGICAL SUPPLY — 27 items
BAG DRAIN SIEMENS DORNER NS (MISCELLANEOUS) ×1 IMPLANT
BAG DRN NS LF (MISCELLANEOUS) ×1
BASKET ZERO TIP 1.9FR (BASKET) IMPLANT
BRUSH SCRUB EZ 1% IODOPHOR (MISCELLANEOUS) ×1 IMPLANT
BSKT STON RTRVL ZERO TP 1.9FR (BASKET) ×1
CATH URET FLEX-TIP 2 LUMEN 10F (CATHETERS) IMPLANT
CATH URETL OPEN END 6X70 (CATHETERS) IMPLANT
CNTNR URN SCR LID CUP LEK RST (MISCELLANEOUS) IMPLANT
CONT SPEC 4OZ STRL OR WHT (MISCELLANEOUS)
DRAPE UTILITY 15X26 TOWEL STRL (DRAPES) ×1 IMPLANT
FIBER LASER MOSES 200 DFL (Laser) IMPLANT
GLOVE SURG UNDER POLY LF SZ7.5 (GLOVE) ×1 IMPLANT
GOWN STRL REUS W/ TWL LRG LVL3 (GOWN DISPOSABLE) ×1 IMPLANT
GOWN STRL REUS W/ TWL XL LVL3 (GOWN DISPOSABLE) ×1 IMPLANT
GOWN STRL REUS W/TWL LRG LVL3 (GOWN DISPOSABLE) ×1
GOWN STRL REUS W/TWL XL LVL3 (GOWN DISPOSABLE) ×1
GUIDEWIRE STR DUAL SENSOR (WIRE) ×1 IMPLANT
IV NS IRRIG 3000ML ARTHROMATIC (IV SOLUTION) ×1 IMPLANT
KIT TURNOVER CYSTO (KITS) ×1 IMPLANT
PACK CYSTO AR (MISCELLANEOUS) ×1 IMPLANT
SET CYSTO W/LG BORE CLAMP LF (SET/KITS/TRAYS/PACK) ×1 IMPLANT
SHEATH NAVIGATOR HD 12/14X36 (SHEATH) IMPLANT
STENT URET 6FRX24 CONTOUR (STENTS) IMPLANT
STENT URET 6FRX26 CONTOUR (STENTS) IMPLANT
SURGILUBE 2OZ TUBE FLIPTOP (MISCELLANEOUS) ×1 IMPLANT
VALVE UROSEAL ADJ ENDO (VALVE) IMPLANT
WATER STERILE IRR 500ML POUR (IV SOLUTION) ×1 IMPLANT

## 2023-02-08 NOTE — Anesthesia Postprocedure Evaluation (Signed)
Anesthesia Post Note  Patient: [name]  Procedure(s) Performed: CYSTOSCOPY/URETEROSCOPY/HOLMIUM LASER/STENT EXCHANGE (Left: Ureter)  Patient location during evaluation: PACU Anesthesia Type: General Level of consciousness: awake and alert Pain management: pain level controlled Vital Signs Assessment: post-procedure vital signs reviewed and stable Respiratory status: spontaneous breathing, nonlabored ventilation and respiratory function stable Cardiovascular status: blood pressure returned to baseline and stable Postop Assessment: no apparent nausea or vomiting Anesthetic complications: no   No notable events documented.   Last Vitals:  Vitals:   02/08/23 1545 02/08/23 1605  BP: (!) 133/50 (!) 155/90  Pulse: 65 68  Resp: 13 18  Temp: 37.1 C (!) 36.2 C  SpO2: 95% 100%    Last Pain:  Vitals:   02/08/23 1605  TempSrc: Temporal  PainSc: 0-No pain                 [place] Ouch

## 2023-02-08 NOTE — H&P (Signed)
Urology H&P   History of Present Illness: Tara Marquez is a 82 y.o. female status post urgent left ureteral stent placement for an obstructing 7 mm left distal ureteral calculus with infection on 01/13/2023.  She presents today for definitive stone management.  Past Medical History:  Diagnosis Date   Acute pyelonephritis    Adaptive colitis    Anemia    Aortic atherosclerosis (HCC)    Atrial fibrillation and flutter (HCC)    a.) CHA2DS2VASc = 5 (age x 2, sex, HTN, vascular disease history);  b.) rate/rhythm maintained on oral amiodarone + metoprolol succinate; chronically anticoagulated with dose reduced apixaban   CAD (coronary artery disease)    Depression    Descending thoracic aortic aneurysm (HCC)    a.) CT chest 09/29/2022: 4.9 cm; b.) CT abd 01/13/2023: 4.9 cm   Frequent PVCs    GERD (gastroesophageal reflux disease)    Hepatic steatosis    History of 2019 novel coronavirus disease (COVID-19) 12/04/2021   Hyperlipidemia    Hypertension    Hypokalemia    Hypothyroidism    Long term current use of amiodarone    Long term current use of anticoagulant    a.) dose reduced apixaban   Nephrolithiasis    Osteopenia    Polymyalgia rheumatica (HCC)    Skin cancer    Splenomegaly    Temporal arteritis ([place])    Vitamin D deficiency     Past Surgical History:  Procedure Laterality Date   ABDOMINAL HYSTERECTOMY  03/1971   Status Post Hysterectomy   CATARACT EXTRACTION Left    COLONOSCOPY     2010   COLONOSCOPY WITH PROPOFOL N/A 01/12/2022   Procedure: COLONOSCOPY WITH PROPOFOL;  Surgeon: Lucilla Lame, MD;  Location: [facility] ENDOSCOPY;  Service: Endoscopy;  Laterality: N/A;   CYSTOSCOPY WITH RETROGRADE PYELOGRAM, URETEROSCOPY AND STENT PLACEMENT Left 01/13/2023   Procedure: CYSTOSCOPY WITH RETROGRADE PYELOGRAM, URETEROSCOPY AND STENT PLACEMENT;  Surgeon: Abbie Sons, MD;  Location: [facility] ORS;  Service: Urology;  Laterality: Left;   ESOPHAGOGASTRODUODENOSCOPY N/A 11/02/2022    Procedure: ESOPHAGOGASTRODUODENOSCOPY (EGD);  Surgeon: Lucilla Lame, MD;  Location: [facility] ENDOSCOPY;  Service: Endoscopy;  Laterality: N/A;   EYE SURGERY     GIVENS CAPSULE STUDY N/A 12/22/2022   Procedure: GIVENS CAPSULE STUDY;  Surgeon: Lucilla Lame, MD;  Location: [facility] ENDOSCOPY;  Service: Endoscopy;  Laterality: N/A;   TONSILLECTOMY     TONSILLECTOMY AND ADENOIDECTOMY  1951   WISDOM TOOTH EXTRACTION  1970    Home Medications:  Current Meds  Medication Sig   amiodarone (PACERONE) 200 MG tablet Take 1 tablet (200 mg total) by mouth 2 (two) times daily.   atorvastatin (LIPITOR) 40 MG tablet Take 1 tablet (40 mg total) by mouth daily at 6 PM. (Patient taking differently: Take 40 mg by mouth every evening.)   cyanocobalamin 1000 MCG tablet Take 1 tablet (1,000 mcg total) by mouth daily.   levothyroxine (SYNTHROID) 75 MCG tablet Take 1 tablet (75 mcg total) by mouth daily. (Patient taking differently: Take 75 mcg by mouth daily before breakfast.)   metoprolol succinate (TOPROL-XL) 25 MG 24 hr tablet Take 25 mg by mouth at bedtime.   ondansetron (ZOFRAN) 4 MG tablet Take 4 mg by mouth every morning.   pantoprazole (PROTONIX) 40 MG tablet Take 1 tablet (40 mg total) by mouth daily before breakfast for 14 days.   sertraline (ZOLOFT) 100 MG tablet TAKE 1 & 1/2 (ONE & ONE-HALF) TABLETS BY MOUTH ONCE DAILY NEED  TO  SCHEDULE  AN  APPOINTMENT (Patient taking differently: Take 150 mg by mouth every morning. TAKE 1 & 1/2 (ONE & ONE-HALF) TABLETS BY MOUTH ONCE DAILY NEED  TO  SCHEDULE  AN  APPOINTMENT)    Allergies:  Allergies  Allergen Reactions   Sulfa Antibiotics Hives   Amoxicillin Rash   Penicillins Rash    Family History  Problem Relation Age of Onset   Cancer Sister        Has had Breast Cancer and is in remission   Breast cancer Sister 61   Congenital heart disease Son     Social History:  reports that she has never smoked. She has never used smokeless tobacco. She reports that  she does not drink alcohol and does not use drugs.  ROS: A complete review of systems was performed.  All systems are negative except for pertinent findings as noted.  Physical Exam:  Vital signs in last 24 hours: Temp:  [97.1 F (36.2 C)] 97.1 F (36.2 C) (02/13 1123) Pulse Rate:  [61] 61 (02/13 1123) Resp:  [18] 18 (02/13 1123) BP: (164)/(71) 164/71 (02/13 1123) SpO2:  [95 %] 95 % (02/13 1123) Weight:  [66.4 kg] 66.4 kg (02/13 1123) Constitutional:  Alert and oriented, No acute distress HEENT: [place] AT, moist mucus membranes.  Trachea midline, no masses Cardiovascular: Regular rate and rhythm Respiratory: Normal respiratory effort, lungs clear bilaterally Psychiatric: Normal mood and affect   Laboratory Data:  No results for input(s): "WBC", "HGB", "HCT" in the last 72 hours. No results for input(s): "NA", "K", "CL", "CO2", "GLUCOSE", "BUN", "CREATININE", "CALCIUM" in the last 72 hours. No results for input(s): "LABPT", "INR" in the last 72 hours. No results for input(s): "LABURIN" in the last 72 hours. Results for orders placed or performed during the hospital encounter of 01/13/23  Resp panel by RT-PCR (RSV, Flu A&B, Covid) Anterior Nasal Swab     Status: None   Collection Time: 01/13/23  4:40 PM   Specimen: Anterior Nasal Swab  Result Value Ref Range Status   SARS Coronavirus 2 by RT PCR NEGATIVE NEGATIVE Final    Comment: (NOTE) SARS-CoV-2 target nucleic acids are NOT DETECTED.  The SARS-CoV-2 RNA is generally detectable in upper respiratory specimens during the acute phase of infection. The lowest concentration of SARS-CoV-2 viral copies this assay can detect is 138 copies/mL. A negative result does not preclude SARS-Cov-2 infection and should not be used as the sole basis for treatment or other patient management decisions. A negative result may occur with  improper specimen collection/handling, submission of specimen other than nasopharyngeal swab, presence of viral  mutation(s) within the areas targeted by this assay, and inadequate number of viral copies(<138 copies/mL). A negative result must be combined with clinical observations, patient history, and epidemiological information. The expected result is Negative.  Fact Sheet for Patients:  EntrepreneurPulse.com.au  Fact Sheet for Healthcare Providers:  IncredibleEmployment.be  This test is no t yet approved or cleared by the [place] FDA and  has been authorized for detection and/or diagnosis of SARS-CoV-2 by FDA under an Emergency Use Authorization (EUA). This EUA will remain  in effect (meaning this test can be used) for the duration of the COVID-19 declaration under Section 564(b)(1) of the Act, 21 U.S.C.section 360bbb-3(b)(1), unless the authorization is terminated  or revoked sooner.       Influenza A by PCR NEGATIVE NEGATIVE Final   Influenza B by PCR NEGATIVE NEGATIVE Final    Comment: (NOTE) The  Xpert Xpress SARS-CoV-2/FLU/RSV plus assay is intended as an aid in the diagnosis of influenza from Nasopharyngeal swab specimens and should not be used as a sole basis for treatment. Nasal washings and aspirates are unacceptable for Xpert Xpress SARS-CoV-2/FLU/RSV testing.  Fact Sheet for Patients: EntrepreneurPulse.com.au  Fact Sheet for Healthcare Providers: IncredibleEmployment.be  This test is not yet approved or cleared by the [place] FDA and has been authorized for detection and/or diagnosis of SARS-CoV-2 by FDA under an Emergency Use Authorization (EUA). This EUA will remain in effect (meaning this test can be used) for the duration of the COVID-19 declaration under Section 564(b)(1) of the Act, 21 U.S.C. section 360bbb-3(b)(1), unless the authorization is terminated or revoked.     Resp Syncytial Virus by PCR NEGATIVE NEGATIVE Final    Comment: (NOTE) Fact Sheet for  Patients: EntrepreneurPulse.com.au  Fact Sheet for Healthcare Providers: IncredibleEmployment.be  This test is not yet approved or cleared by the [place] FDA and has been authorized for detection and/or diagnosis of SARS-CoV-2 by FDA under an Emergency Use Authorization (EUA). This EUA will remain in effect (meaning this test can be used) for the duration of the COVID-19 declaration under Section 564(b)(1) of the Act, 21 U.S.C. section 360bbb-3(b)(1), unless the authorization is terminated or revoked.  Performed at [facility], [address], [place], [place] [address]   Urine Culture     Status: Abnormal   Collection Time: 01/13/23  7:05 PM   Specimen: PATH Other; Urine  Result Value Ref Range Status   Specimen Description   Final    CYSTOSCOPY Performed at [facility], [place] [address] [place], [place] [address]    Special Requests   Final    NONE Performed at [facility], [place], [place] [address]    Culture 50,000 COLONIES/mL KLEBSIELLA PNEUMONIAE (A)  Final   Report Status 01/15/2023 FINAL  Final   Organism ID, Bacteria KLEBSIELLA PNEUMONIAE (A)  Final      Susceptibility   Klebsiella pneumoniae - MIC*    AMPICILLIN >=32 RESISTANT Resistant     CEFAZOLIN <=4 SENSITIVE Sensitive     CEFEPIME <=0.12 SENSITIVE Sensitive     CEFTAZIDIME <=1 SENSITIVE Sensitive     CEFTRIAXONE <=0.25 SENSITIVE Sensitive     CIPROFLOXACIN <=0.25 SENSITIVE Sensitive     GENTAMICIN <=1 SENSITIVE Sensitive     IMIPENEM 0.5 SENSITIVE Sensitive     TRIMETH/SULFA <=20 SENSITIVE Sensitive     AMPICILLIN/SULBACTAM 4 SENSITIVE Sensitive     PIP/TAZO <=4 SENSITIVE Sensitive     * 50,000 COLONIES/mL KLEBSIELLA PNEUMONIAE     Radiologic Imaging: DG OR UROLOGY CYSTO IMAGE ([facility] ONLY)  Result Date: 02/08/2023 There is no interpretation for this exam.  This order is for images obtained during a surgical  procedure.  Please See "Surgeries" Tab for more information regarding the procedure.    Impression/Assessment:  Left distal ureteral calculus  Plan:  Left ureteroscopy with laser lithotripsy/stone removal and left ureteral stent exchange The procedure has been discussed in detail including potential risks.  All questions were answered  02/08/2023, 2:07 PM  John Giovanni,  MD

## 2023-02-08 NOTE — Anesthesia Procedure Notes (Signed)
Procedure Name: LMA Insertion Date/Time: 02/08/2023 2:24 PM  Performed by: Otho Perl, CRNAPre-anesthesia Checklist: Patient identified, Patient being monitored, Timeout performed, Emergency Drugs available and Suction available Patient Re-evaluated:Patient Re-evaluated prior to induction Oxygen Delivery Method: Circle system utilized Preoxygenation: Pre-oxygenation with 100% oxygen Induction Type: IV induction Ventilation: Mask ventilation without difficulty LMA: LMA inserted LMA Size: 3.0 Tube type: Oral Number of attempts: 1 Placement Confirmation: positive ETCO2 and breath sounds checked- equal and bilateral Tube secured with: Tape Dental Injury: Teeth and Oropharynx as per pre-operative assessment

## 2023-02-08 NOTE — Transfer of Care (Signed)
Immediate Anesthesia Transfer of Care Note  Patient: Tara Marquez  Procedure(s) Performed: CYSTOSCOPY/URETEROSCOPY/HOLMIUM LASER/STENT EXCHANGE (Left: Ureter)  Patient Location: PACU  Anesthesia Type:General  Level of Consciousness: awake  Airway & Oxygen Therapy: Patient Spontanous Breathing and Patient connected to face mask oxygen  Post-op Assessment: Report given to RN and Post -op Vital signs reviewed and stable  Post vital signs: Reviewed  Last Vitals:  Vitals Value Taken Time  BP 141/51 02/08/23 1511  Temp 4F   Pulse 69 02/08/23 1513  Resp 17 02/08/23 1513  SpO2 100 % 02/08/23 1513  Vitals shown include unvalidated device data.  Last Pain:  Vitals:   02/08/23 1123  TempSrc: Temporal  PainSc: 0-No pain         Complications: No notable events documented.

## 2023-02-08 NOTE — Op Note (Addendum)
    Preoperative diagnosis:  Left distal ureteral calculus  Postoperative diagnosis:  Left distal ureteral calculus  Procedure:  Cystoscopy Left ureteroscopy and stone removal Ureteroscopic laser lithotripsy Left ureteral stent exchange (28F/24 cm)  Left retrograde pyelography with interpretation  Surgeon: Nicki Reaper C. Lexington Devine, M.D.  Anesthesia: General  Complications: None  Intraoperative findings:  Cystoscopy-inflammatory changes left hemitrigone secondary to indwelling stent.  Otherwise no erythema, solid or papillary lesions. Left ureteroscopy-nonimpacted calculus identified in the upper portion of the distal ureter.  No inflammatory changes of the ureteral mucosa noted Left retrograde pyelography post procedure showed no filling defects, stone fragments or contrast extravasation  EBL: Minimal  Specimens: Calculus fragments for analysis   Indication: Tara Marquez is a 82 y.o. s/p urgent left ureteral stent placement for an obstructing 7 mm left distal ureteral calculus with sepsis from a urinary source on 01/13/2023.  She presents today for definitive stone management.  After reviewing the management options for treatment, the patient elected to proceed with the above surgical procedure(s). We have discussed the potential benefits and risks of the procedure, side effects of the proposed treatment, the likelihood of the patient achieving the goals of the procedure, and any potential problems that might occur during the procedure or recuperation. Informed consent has been obtained.  Description of procedure:  The patient was taken to the operating room and general anesthesia was induced.  The patient was placed in the dorsal lithotomy position, prepped and draped in the usual sterile fashion, and preoperative antibiotics were administered. A preoperative time-out was performed.   A 21 French cystoscope was lubricated and passed per urethra.  Panendoscopy was performed with  findings as described above.  No stent encrustation was noted.   The stent was grasped with endoscopic forceps and brought out through the urethral meatus. A 0.038 Sensor wire was then placed through the stent and advanced up the ureter into the renal pelvis under fluoroscopic guidance.  A 4.5 Fr semirigid ureteroscope was then advanced into the ureter alongside the guidewire and the calculus was identified as described above.  A 200 m Moses holmium laser fiber was then placed through the ureteroscope and the calculus was dusted at a setting of 0.3J/80 Hz.  The smaller fragments that chipped off stone during dusting were removed with a 1.9 French 0 tip nitinol basket.  Final ureteroscopy was performed up to the renal pelvis and no significantly sized fragments were identified.  A 28F/24 cm Contour ureteral stent was  placed under fluoroscopic guidance.  The wire was then removed with an adequate stent curl noted in the renal pelvis as well as in the bladder.  The bladder was then emptied and the procedure ended.  The patient appeared to tolerate the procedure well and without complications.  After anesthetic reversal the patient was transported to the PACU in stable condition.   Plan: Office follow-up 7-10 days for cystoscopy with stent removal.  Recent creatinine performed by PCP was 3.2.  It was 1.2 when she was discharged from the hospital after stent placement.  She will return 2/15 for repeat basic metabolic panel   John Giovanni, MD

## 2023-02-08 NOTE — Interval H&P Note (Signed)
History and Physical Interval Note:  02/08/2023 2:14 PM  Tara Marquez  has presented today for surgery, with the diagnosis of Left Ureteral Stone.  The various methods of treatment have been discussed with the patient and family. After consideration of risks, benefits and other options for treatment, the patient has consented to  Procedure(s): CYSTOSCOPY/URETEROSCOPY/HOLMIUM LASER/STENT EXCHANGE (Left) as a surgical intervention.  The patient's history has been reviewed, patient examined, no change in status, stable for surgery.  I have reviewed the patient's chart and labs.  Questions were answered to the patient's satisfaction.     Bono

## 2023-02-08 NOTE — Anesthesia Preprocedure Evaluation (Addendum)
Anesthesia Evaluation  Patient identified by MRN, date of birth, ID band Patient awake    Reviewed: Allergy & Precautions, NPO status , Patient's Chart, lab work & pertinent test results  History of Anesthesia Complications Negative for: history of anesthetic complications  Airway Mallampati: IV       Dental  (+) Missing   Pulmonary neg pulmonary ROS   Pulmonary exam normal breath sounds clear to auscultation       Cardiovascular hypertension, + Peripheral Vascular Disease (descending thoracic aortic aneurysm)  + dysrhythmias (a fib on Eliquis)  Rhythm:Irregular Rate:Normal  ECG 01/13/23: Atrial flutter with variable A-V block  Echo 05/28/21: 1. Low normal left ventricular systolic function with an EF of 50% 2. No regional wall motion abnormalities 3. Right ventricular size and function normal  4. Mild mitral, tricuspid, and pulmonary valve regurgitation 5. Normal gradients; no valvular stenosis 6. No pericardial effusion   Myocardial perfusion 10/21/18: 1. Normal left ventricular systolic function with a normal LVEF of 55 to 65%  2. Normal myocardial thickening and wall motion 3. Left ventricular cavity size normal 4. SPECT images demonstrate homogenous tracer distribution throughout the myocardium 5. No evidence of stress-induced myocardial ischemia or arrhythmia 6. Normal low risk study    Neuro/Psych  PSYCHIATRIC DISORDERS  Depression    negative neurological ROS     GI/Hepatic ,GERD  ,,  Endo/Other  Hypothyroidism    Renal/GU negative Renal ROS     Musculoskeletal   Abdominal   Peds  Hematology  (+) Blood dyscrasia, anemia   Anesthesia Other Findings Cardiology note 01/04/23:  1. Paroxysmal atrial fibrillation: EKG shows sinus bradycardia by EKG at 56 bpm -Continue amiodarone 100 mg daily -Continue metoprolol succinate 25 mg once daily for heart rate control and maintenance of normal sinus  rhythm -After full discussion and disclosure of all aspects of the Watchman left atrial appendage closure device, the patient will be referred for consultation with electrophysiology for the possible placement of the device  2. Hypertension: Currently stable in the office today at 130/60. No medication side effects at this time. -Continue metoprolol with no changes  3. Frequent PVCs: No reported worsening PVCs over the last several months. -Continue metoprolol with no changes  4. Hyperlipidemia: Currently on atorvastatin 40 mg daily with no reported side effects. Last lipid panel on 06/11/2021 showed an LDL of 67 -Continue atorvastatin 40 mg daily    Reproductive/Obstetrics                             Anesthesia Physical Anesthesia Plan  ASA: 3  Anesthesia Plan: General   Post-op Pain Management:    Induction: Intravenous  PONV Risk Score and Plan: 3 and Treatment may vary due to age or medical condition, Ondansetron and Dexamethasone  Airway Management Planned: LMA  Additional Equipment:   Intra-op Plan:   Post-operative Plan: Extubation in OR  Informed Consent: I have reviewed the patients History and Physical, chart, labs and discussed the procedure including the risks, benefits and alternatives for the proposed anesthesia with the patient or authorized representative who has indicated his/her understanding and acceptance.     Dental advisory given  Plan Discussed with: CRNA  Anesthesia Plan Comments: (Patient consented for risks of anesthesia including but not limited to:  - adverse reactions to medications - damage to eyes, teeth, lips or other oral mucosa - nerve damage due to positioning  - sore throat or hoarseness - damage  to heart, brain, nerves, lungs, other parts of body or loss of life  Informed patient about role of CRNA in peri- and intra-operative care.  Patient voiced understanding.)        Anesthesia Quick  Evaluation

## 2023-02-08 NOTE — Discharge Instructions (Signed)
AMBULATORY SURGERY  ?DISCHARGE INSTRUCTIONS ? ? ?The drugs that you were given will stay in your system until tomorrow so for the next 24 hours you should not: ? ?Drive an automobile ?Make any legal decisions ?Drink any alcoholic beverage ? ? ?You may resume regular meals tomorrow.  Today it is better to start with liquids and gradually work up to solid foods. ? ?You may eat anything you prefer, but it is better to start with liquids, then soup and crackers, and gradually work up to solid foods. ? ? ?Please notify your doctor immediately if you have any unusual bleeding, trouble breathing, redness and pain at the surgery site, drainage, fever, or pain not relieved by medication. ? ? ? ?Additional Instructions: ? ? ? ?Please contact your physician with any problems or Same Day Surgery at [phone], Monday through Friday 6 am to 4 pm, or [place] [facility] [place] Main number at [phone].  ?

## 2023-02-09 ENCOUNTER — Ambulatory Visit: Payer: Medicare Other

## 2023-02-09 ENCOUNTER — Encounter: Payer: Self-pay | Admitting: Urology

## 2023-02-10 ENCOUNTER — Other Ambulatory Visit: Payer: Medicare Other

## 2023-02-10 ENCOUNTER — Encounter: Payer: Self-pay | Admitting: Internal Medicine

## 2023-02-10 DIAGNOSIS — R7989 Other specified abnormal findings of blood chemistry: Secondary | ICD-10-CM | POA: Diagnosis not present

## 2023-02-11 ENCOUNTER — Encounter: Payer: Medicare Other | Admitting: *Deleted

## 2023-02-11 LAB — BASIC METABOLIC PANEL
BUN/Creatinine Ratio: 9 — ABNORMAL LOW (ref 12–28)
BUN: 23 mg/dL (ref 8–27)
CO2: 20 mmol/L (ref 20–29)
Calcium: 8.6 mg/dL — ABNORMAL LOW (ref 8.7–10.3)
Chloride: 106 mmol/L (ref 96–106)
Creatinine, Ser: 2.47 mg/dL — ABNORMAL HIGH (ref 0.57–1.00)
Glucose: 100 mg/dL — ABNORMAL HIGH (ref 70–99)
Potassium: 3.7 mmol/L (ref 3.5–5.2)
Sodium: 144 mmol/L (ref 134–144)
eGFR: 19 mL/min/{1.73_m2} — ABNORMAL LOW (ref >59)

## 2023-02-13 ENCOUNTER — Other Ambulatory Visit: Payer: Self-pay | Admitting: Urology

## 2023-02-13 DIAGNOSIS — R7989 Other specified abnormal findings of blood chemistry: Secondary | ICD-10-CM

## 2023-02-14 ENCOUNTER — Encounter: Payer: Self-pay | Admitting: *Deleted

## 2023-02-15 ENCOUNTER — Telehealth: Payer: Self-pay | Admitting: Urology

## 2023-02-15 NOTE — Telephone Encounter (Signed)
Yes , that is okay for the stent to stay in a little longer

## 2023-02-15 NOTE — Telephone Encounter (Signed)
Patient's husband called and wants to verify with Dr. Bernardo Heater that it is ok for his wife to wait until 2/29 for stent removal. He said that Dr. Bernardo Heater had told him removal should be in a week, and he is worried about waiting longer. He said she is prone to infections. Please advise.

## 2023-02-16 LAB — CALCULI, WITH PHOTOGRAPH (CLINICAL LAB)
Calcium Oxalate Dihydrate: 20 %
Calcium Oxalate Monohydrate: 80 %
Weight Calculi: 56 mg

## 2023-02-16 NOTE — Telephone Encounter (Signed)
Left message on voicemail  Unable to contact letter mailed

## 2023-02-19 ENCOUNTER — Other Ambulatory Visit: Payer: Self-pay

## 2023-02-19 ENCOUNTER — Emergency Department
Admission: EM | Admit: 2023-02-19 | Discharge: 2023-02-19 | Disposition: A | Discharge status: Home / Self Care | Payer: Medicare Other | Attending: Emergency Medicine | Admitting: Emergency Medicine

## 2023-02-19 ENCOUNTER — Emergency Department: Payer: Medicare Other

## 2023-02-19 DIAGNOSIS — R109 Unspecified abdominal pain: Secondary | ICD-10-CM | POA: Diagnosis not present

## 2023-02-19 DIAGNOSIS — E876 Hypokalemia: Secondary | ICD-10-CM | POA: Insufficient documentation

## 2023-02-19 DIAGNOSIS — R21 Rash and other nonspecific skin eruption: Secondary | ICD-10-CM | POA: Diagnosis not present

## 2023-02-19 DIAGNOSIS — D72829 Elevated white blood cell count, unspecified: Secondary | ICD-10-CM | POA: Insufficient documentation

## 2023-02-19 DIAGNOSIS — K6289 Other specified diseases of anus and rectum: Secondary | ICD-10-CM | POA: Diagnosis present

## 2023-02-19 DIAGNOSIS — K5900 Constipation, unspecified: Secondary | ICD-10-CM

## 2023-02-19 LAB — CBC WITH DIFFERENTIAL/PLATELET
Abs Immature Granulocytes: 0.03 10*3/uL (ref 0.00–0.07)
Basophils Absolute: 0.1 10*3/uL (ref 0.0–0.1)
Basophils Relative: 1 %
Eosinophils Absolute: 0.1 10*3/uL (ref 0.0–0.5)
Eosinophils Relative: 1 %
HCT: 37.4 % (ref 36.0–46.0)
Hemoglobin: 11.4 g/dL — ABNORMAL LOW (ref 12.0–15.0)
Immature Granulocytes: 0 %
Lymphocytes Relative: 10 %
Lymphs Abs: 1.1 10*3/uL (ref 0.7–4.0)
MCH: 24.7 pg — ABNORMAL LOW (ref 26.0–34.0)
MCHC: 30.5 g/dL (ref 30.0–36.0)
MCV: 81.1 fL (ref 80.0–100.0)
Monocytes Absolute: 0.5 10*3/uL (ref 0.1–1.0)
Monocytes Relative: 5 %
Neutro Abs: 9.3 10*3/uL — ABNORMAL HIGH (ref 1.7–7.7)
Neutrophils Relative %: 83 %
Platelets: 237 10*3/uL (ref 150–400)
RBC: 4.61 MIL/uL (ref 3.87–5.11)
RDW: 16.9 % — ABNORMAL HIGH (ref 11.5–15.5)
WBC: 11.2 10*3/uL — ABNORMAL HIGH (ref 4.0–10.5)
nRBC: 0 % (ref 0.0–0.2)

## 2023-02-19 LAB — COMPREHENSIVE METABOLIC PANEL
ALT: 17 U/L (ref 0–44)
AST: 35 U/L (ref 15–41)
Albumin: 3.7 g/dL (ref 3.5–5.0)
Alkaline Phosphatase: 88 U/L (ref 38–126)
Anion gap: 12 (ref 5–15)
BUN: 16 mg/dL (ref 8–23)
CO2: 25 mmol/L (ref 22–32)
Calcium: 9 mg/dL (ref 8.9–10.3)
Chloride: 104 mmol/L (ref 98–111)
Creatinine, Ser: 1.51 mg/dL — ABNORMAL HIGH (ref 0.44–1.00)
GFR, Estimated: 35 mL/min — ABNORMAL LOW (ref >60)
Glucose, Bld: 128 mg/dL — ABNORMAL HIGH (ref 70–99)
Potassium: 3.1 mmol/L — ABNORMAL LOW (ref 3.5–5.1)
Sodium: 141 mmol/L (ref 135–145)
Total Bilirubin: 1.2 mg/dL (ref 0.3–1.2)
Total Protein: 7.1 g/dL (ref 6.5–8.1)

## 2023-02-19 MED ORDER — POTASSIUM CHLORIDE CRYS ER 20 MEQ PO TBCR
40.0000 meq | EXTENDED_RELEASE_TABLET | Freq: Once | ORAL | Status: AC
  Administered 2023-02-19: 40 meq via ORAL
  Filled 2023-02-19: qty 2

## 2023-02-19 MED ORDER — SODIUM CHLORIDE 0.9 % IV BOLUS
1000.0000 mL | Freq: Once | INTRAVENOUS | Status: AC
  Administered 2023-02-19: 1000 mL via INTRAVENOUS

## 2023-02-19 NOTE — ED Provider Notes (Signed)
[facility] Provider Note    Event Date/Time   First MD Initiated Contact with Patient 02/19/23 1653     (approximate)   History   Constipation   HPI  Tara Marquez is a 82 y.o. female I reviewed the notes the patient had a left kidney stone treated on 2/13 with cystoscopy.  Patient reports not having a bowel movement for the past 4 to 5 days with some rectal pain.  Patient reports that she tried a capful of MiraLAX, senna without any relief.  She reports a lot of rectal pressure and rectal pain.  She denies any blood in her stools any chest pain, shortness of breath.  Denies any abdominal pain.  She denies being on any painkillers.  Denies any kidney stone pain.  She still has a stent in place from her recent admission.  No nausea, no vomiting.  Physical Exam   Triage Vital Signs: ED Triage Vitals  Enc Vitals Group     BP 02/19/23 1557 (!) 138/102     Pulse Rate 02/19/23 1557 (!) 107     Resp 02/19/23 1557 18     Temp 02/19/23 1557 (!) 97.4 F (36.3 C)     Temp Source 02/19/23 1557 Oral     SpO2 02/19/23 1557 98 %     Weight 02/19/23 1559 146 lb (66.2 kg)     Height --      Head Circumference --      Peak Flow --      Pain Score 02/19/23 1558 4     Pain Loc --      Pain Edu? --      Excl. in [place]? --     Most recent vital signs: Vitals:   02/19/23 1557  BP: (!) 138/102  Pulse: (!) 107  Resp: 18  Temp: (!) 97.4 F (36.3 C)  SpO2: 98%     General: Awake, no distress.  CV:  Good peripheral perfusion.  Resp:  Normal effort.  Abd:  No distention.  Soft nontender Other:  Rectal exam shows some yellow stool noted in the vault.  She has some skin breakdown noted around her rectum without any obvious hemorrhoid.  No abscess noted   ED Results / Procedures / Treatments   Labs (all labs ordered are listed, but only abnormal results are displayed) Labs Reviewed  CBC WITH DIFFERENTIAL/PLATELET - Abnormal; Notable for the following components:       Result Value   WBC 11.2 (*)    Hemoglobin 11.4 (*)    MCH 24.7 (*)    RDW 16.9 (*)    Neutro Abs 9.3 (*)    All other components within normal limits  COMPREHENSIVE METABOLIC PANEL - Abnormal; Notable for the following components:   Potassium 3.1 (*)    Glucose, Bld 128 (*)    Creatinine, Ser 1.51 (*)    GFR, Estimated 35 (*)    All other components within normal limits      RADIOLOGY I have reviewed the xray personally and interpreted and patient has evidence of constipation  PROCEDURES:  Critical Care performed: No  Procedures  ------------------------------------------------------------------------------------------------------------------- Fecal Disimpaction Procedure Note:  Performed by me:  Patient placed in the lateral recumbent position with knees drawn towards chest. Nurse present for patient support. Medium amount of yellow soft stool removed. No complications during procedure.   ------------------------------------------------------------------------------------------------------------------   MEDICATIONS ORDERED IN ED: Medications  potassium chloride SA (KLOR-CON M) CR tablet 40 mEq (  has no administration in time range)  sodium chloride 0.9 % bolus 1,000 mL (1,000 mLs Intravenous New Bag/Given 02/19/23 1734)     IMPRESSION / MDM / ASSESSMENT AND PLAN / ED COURSE  I reviewed the triage vital signs and the nursing notes.   Patient's presentation is most consistent with acute presentation with potential threat to life or bodily function.   Differential includes constipation, abscess, hemorrhoid, abscess.  Rectal exam which does show skin breakdown kind of like a diaper rash.  No abscess or hemorrhoid noted.  She had good amount of stool in the vault that was removed.  Discussed with patient and her family member about CT imaging but her abdomen is soft and nontender we discussed the risk for rectal abscess but seems less likely no prior history of this  and her pain seems to be more just from she has the large bowel movements.  X-ray does confirm large stool.  White count slightly elevated but no fever and at this time they would like to hold off on any CT imaging given reassuring exam.  CMP shows creatinine that is downtrending from prior at 1.5.  Potassium slightly low at 3.1.  CBC shows slightly elevated white count at 11  We discussed different ways to help with the stool including increasing regimen of MiraLAX.  Offered enema, lactulose here and patient declined stating that she would rather do this at home.  We discussed sitz bath's and barrier creams to help with the rash on her bottom.   The patient is on the cardiac monitor to evaluate for evidence of arrhythmia and/or significant heart rate changes.      FINAL CLINICAL IMPRESSION(S) / ED DIAGNOSES   Final diagnoses:  Rash  Constipation, unspecified constipation type     Rx / DC Orders   ED Discharge Orders     None        Note:  This document was prepared using Dragon voice recognition software and may include unintentional dictation errors.   Vanessa [place], MD 02/19/23 [phone]

## 2023-02-19 NOTE — ED Triage Notes (Signed)
Pt states she has not had a BM in 4-5 days. Taking everything that she can and not going. Pt c/o rectal pain from pushing and trying to have BM.

## 2023-02-19 NOTE — Discharge Instructions (Addendum)
We discussed using some shaving cream before having a bowel movement to help wipe the stool off easier, doing a sitz bath after bowel movement, getting completely dry afterwards and then doing the barrier cream.  We also discussed using the MiraLAX.  This time we have holding off on CT imaging but if you develop abdominal discomfort fevers need to return to the ER immediately for repeat evaluation otherwise you can follow-up with your primary care doctor

## 2023-02-23 ENCOUNTER — Other Ambulatory Visit: Payer: Self-pay | Admitting: *Deleted

## 2023-02-23 DIAGNOSIS — D509 Iron deficiency anemia, unspecified: Secondary | ICD-10-CM

## 2023-02-24 ENCOUNTER — Telehealth: Payer: Self-pay | Admitting: *Deleted

## 2023-02-24 ENCOUNTER — Encounter: Payer: Self-pay | Admitting: Urology

## 2023-02-24 ENCOUNTER — Ambulatory Visit (INDEPENDENT_AMBULATORY_CARE_PROVIDER_SITE_OTHER): Payer: Medicare Other | Admitting: Urology

## 2023-02-24 ENCOUNTER — Telehealth: Payer: Self-pay | Admitting: Vascular Surgery

## 2023-02-24 VITALS — BP 170/75 | HR 60 | Ht 65.0 in | Wt 131.5 lb

## 2023-02-24 DIAGNOSIS — N201 Calculus of ureter: Secondary | ICD-10-CM | POA: Diagnosis not present

## 2023-02-24 DIAGNOSIS — R82998 Other abnormal findings in urine: Secondary | ICD-10-CM

## 2023-02-24 LAB — MICROSCOPIC EXAMINATION
RBC, Urine: >30 /hpf — AB (ref 0–2)
WBC, UA: >30 /hpf — AB (ref 0–5)

## 2023-02-24 LAB — URINALYSIS, COMPLETE
Bilirubin, UA: NEGATIVE
Ketones, UA: NEGATIVE
Nitrite, UA: POSITIVE — AB
Specific Gravity, UA: 1.025 (ref 1.005–1.030)
Urobilinogen, Ur: 0.2 mg/dL (ref 0.2–1.0)
pH, UA: 6 (ref 5.0–7.5)

## 2023-02-24 MED ORDER — CIPROFLOXACIN HCL 500 MG PO TABS
500.0000 mg | ORAL_TABLET | Freq: Once | ORAL | Status: AC
  Administered 2023-02-24: 500 mg via ORAL

## 2023-02-24 MED ORDER — CIPROFLOXACIN HCL 250 MG PO TABS: 250.0000 mg | ORAL_TABLET | Freq: Two times a day (BID) | ORAL | 0 refills | Status: DC

## 2023-02-24 NOTE — Telephone Encounter (Signed)
        Patient  visited [facility] on 02/19/2023  for pain   Telephone encounter attempt :  1st  A HIPAA compliant voice message was left requesting a return call.  Instructed patient to call back at [phone]. [place] [phone] [address] , [place] [address] Email : [email or web address]

## 2023-02-24 NOTE — Progress Notes (Signed)
Tara Marquez was scheduled for cystoscopy with stent removal today however her UA showed >30 RBC/greater than 30 sign WBC and was nitrite +.  Urine culture was ordered and due to antibiotic allergies Rx Cipro 250 twice daily was sent to pharmacy.  Cystoscopy will be rescheduled.

## 2023-02-24 NOTE — Telephone Encounter (Signed)
LVM for pt to call Radiology scheduling to make the appt for CTA ordered by Dr. Lucky Cowboy.  After making the appt, to please call the office and schedule an appt with Dr. Lucky Cowboy for CT results.

## 2023-03-01 ENCOUNTER — Inpatient Hospital Stay: Payer: Medicare Other

## 2023-03-01 ENCOUNTER — Inpatient Hospital Stay (HOSPITAL_BASED_OUTPATIENT_CLINIC_OR_DEPARTMENT_OTHER): Payer: Medicare Other | Admitting: Internal Medicine

## 2023-03-01 ENCOUNTER — Inpatient Hospital Stay: Payer: Medicare Other | Attending: Internal Medicine

## 2023-03-01 ENCOUNTER — Encounter: Payer: Self-pay | Admitting: Internal Medicine

## 2023-03-01 VITALS — BP 147/48 | HR 49

## 2023-03-01 VITALS — BP 132/69 | HR 51 | Temp 96.0°F | Resp 16 | Wt 132.0 lb

## 2023-03-01 DIAGNOSIS — D500 Iron deficiency anemia secondary to blood loss (chronic): Secondary | ICD-10-CM

## 2023-03-01 DIAGNOSIS — R319 Hematuria, unspecified: Secondary | ICD-10-CM | POA: Diagnosis not present

## 2023-03-01 DIAGNOSIS — D509 Iron deficiency anemia, unspecified: Secondary | ICD-10-CM

## 2023-03-01 DIAGNOSIS — N200 Calculus of kidney: Secondary | ICD-10-CM | POA: Diagnosis not present

## 2023-03-01 DIAGNOSIS — R3129 Other microscopic hematuria: Secondary | ICD-10-CM | POA: Insufficient documentation

## 2023-03-01 DIAGNOSIS — Z7901 Long term (current) use of anticoagulants: Secondary | ICD-10-CM | POA: Diagnosis not present

## 2023-03-01 DIAGNOSIS — I4891 Unspecified atrial fibrillation: Secondary | ICD-10-CM | POA: Insufficient documentation

## 2023-03-01 LAB — CBC WITH DIFFERENTIAL/PLATELET
Abs Immature Granulocytes: 0.02 10*3/uL (ref 0.00–0.07)
Basophils Absolute: 0.1 10*3/uL (ref 0.0–0.1)
Basophils Relative: 1 %
Eosinophils Absolute: 0.2 10*3/uL (ref 0.0–0.5)
Eosinophils Relative: 2 %
HCT: 32.4 % — ABNORMAL LOW (ref 36.0–46.0)
Hemoglobin: 9.8 g/dL — ABNORMAL LOW (ref 12.0–15.0)
Immature Granulocytes: 0 %
Lymphocytes Relative: 19 %
Lymphs Abs: 1.3 10*3/uL (ref 0.7–4.0)
MCH: 25.1 pg — ABNORMAL LOW (ref 26.0–34.0)
MCHC: 30.2 g/dL (ref 30.0–36.0)
MCV: 82.9 fL (ref 80.0–100.0)
Monocytes Absolute: 0.5 10*3/uL (ref 0.1–1.0)
Monocytes Relative: 7 %
Neutro Abs: 5 10*3/uL (ref 1.7–7.7)
Neutrophils Relative %: 71 %
Platelets: 196 10*3/uL (ref 150–400)
RBC: 3.91 MIL/uL (ref 3.87–5.11)
RDW: 17.4 % — ABNORMAL HIGH (ref 11.5–15.5)
WBC: 6.9 10*3/uL (ref 4.0–10.5)
nRBC: 0 % (ref 0.0–0.2)

## 2023-03-01 LAB — IRON AND TIBC
Iron: 44 ug/dL (ref 28–170)
Saturation Ratios: 10 % — ABNORMAL LOW (ref 10.4–31.8)
TIBC: 437 ug/dL (ref 250–450)
UIBC: 393 ug/dL

## 2023-03-01 LAB — FERRITIN: Ferritin: 35 ng/mL (ref 11–307)

## 2023-03-01 LAB — VITAMIN B12: Vitamin B-12: 643 pg/mL (ref 180–914)

## 2023-03-01 MED ORDER — SODIUM CHLORIDE 0.9 % IV SOLN
INTRAVENOUS | Status: DC
  Filled 2023-03-01: qty 250

## 2023-03-01 MED ORDER — SODIUM CHLORIDE 0.9 % IV SOLN
510.0000 mg | Freq: Once | INTRAVENOUS | Status: AC
  Administered 2023-03-01: 510 mg via INTRAVENOUS
  Filled 2023-03-01: qty 17

## 2023-03-01 NOTE — Progress Notes (Unsigned)
Pt in for follow up and husband reports pt had kidney blockage from stone in left kidney, surgery and stent which patient still has in place.

## 2023-03-03 ENCOUNTER — Encounter: Payer: Self-pay | Admitting: Internal Medicine

## 2023-03-03 ENCOUNTER — Telehealth: Payer: Self-pay | Admitting: Family Medicine

## 2023-03-03 LAB — CULTURE, URINE COMPREHENSIVE

## 2023-03-03 NOTE — Progress Notes (Signed)
[facility] Initial Visit:  Patient Care Team: Eulas Post, MD as PCP - General (Family Medicine) Birder Robson, MD as Referring Physician (Ophthalmology) Corey Skains, MD as Consulting Physician (Cardiology) Oneta Rack, MD as Consulting Physician (Dermatology) Valente David, RN as [place] [facility] Jane Canary, MD as Consulting Physician (Oncology)  CHIEF COMPLAINTS/PURPOSE OF CONSULTATION:  Oncology History   No history exists.    HISTORY OF PRESENTING ILLNESS: Tara Marquez 82 y.o. female is here because of iron deficiency anemia.   She has positive cologaurd test in Oct 2022. Colonoscopy done in Jan 2023 showed non bleeding internal hemorrhoids only. CBC showed worsening anemia with recent Hb 9.5. MCV 76. WBC and platelets normal. Iron panel from July 2023 showed ferritin 24, saturation 7%.  She received IV Feraheme x2 doses completed on 08/12/2022.  She was also found to have microscopic hematuria.  Follows with Dr. Bernardo Heater.  Cystoscopy was unremarkable. CT abdomen with and without contrast showed bilateral nephrolithiasis with largest kidney stone in the inferior pole measuring 8 mm and 2 punctate stones identified within the upper and lower pole of right kidney.  She had endoscopy with Dr. Allen Norris on 11/02/2022 which showed small hiatal hernia and benign polyp in stomach with no evidence of bleeding.  11/03/2022- Repeat iron panel from  showed significant drop and iron levels with ferritin of 17 and saturation 6%.  Treated with IV Feraheme x 2  01/13/2023-admitted with obstructive uropathy with superimposed pyelonephritis on the left side status post ureteral stent placement by Dr. Bernardo Heater.  Urine culture showed Klebsiella pneumoniae.  Treated with antibiotics.  She had cystoscopy, left ureteroscopy and stone removal.  Laser lithotripsy and stent exchange.  She was seen on 02/24/2023 by Dr. Bernardo Heater for stent removal.   However her UA was concerning for infection and she was started on antibiotics.   INTERVAL HISTORY-  Patient was seen today for follow-up labs and possible iron infusion. She had long hospital course for kidney infection and stones.  She was seen by Dr. Bernardo Heater here recently but could not have stent removed due to UA concerning for infection.  She completed 5-day course of antibiotics.  She tells me she does not have a follow-up appointment and is not sure when procedure will be scheduled.   Review of Systems  Constitutional:  Positive for fatigue. Negative for appetite change and diaphoresis.  Respiratory:  Negative for cough and shortness of breath.   Cardiovascular:  Negative for chest pain and leg swelling.  Gastrointestinal:  Negative for abdominal pain.  Neurological:  Negative for dizziness.   Positive ROS as above Rest 10 points ROS negative  MEDICAL HISTORY: Past Medical History:  Diagnosis Date   Acute pyelonephritis    Adaptive colitis    Anemia    Aortic atherosclerosis (HCC)    Atrial fibrillation and flutter (HCC)    a.) CHA2DS2VASc = 5 (age x 2, sex, HTN, vascular disease history);  b.) rate/rhythm maintained on oral amiodarone + metoprolol succinate; chronically anticoagulated with dose reduced apixaban   CAD (coronary artery disease)    Depression    Descending thoracic aortic aneurysm (HCC)    a.) CT chest 09/29/2022: 4.9 cm; b.) CT abd 01/13/2023: 4.9 cm   Frequent PVCs    GERD (gastroesophageal reflux disease)    Hepatic steatosis    History of 2019 novel coronavirus disease (COVID-19) 12/04/2021   Hyperlipidemia    Hypertension    Hypokalemia  Hypothyroidism    Long term current use of amiodarone    Long term current use of anticoagulant    a.) dose reduced apixaban   Nephrolithiasis    Osteopenia    Polymyalgia rheumatica (HCC)    Skin cancer    Splenomegaly    Temporal arteritis (HCC)    Vitamin D deficiency     SURGICAL HISTORY: Past  Surgical History:  Procedure Laterality Date   ABDOMINAL HYSTERECTOMY  03/1971   Status Post Hysterectomy   CATARACT EXTRACTION Left    COLONOSCOPY     2010   COLONOSCOPY WITH PROPOFOL N/A 01/12/2022   Procedure: COLONOSCOPY WITH PROPOFOL;  Surgeon: Lucilla Lame, MD;  Location: [facility] ENDOSCOPY;  Service: Endoscopy;  Laterality: N/A;   CYSTOSCOPY WITH RETROGRADE PYELOGRAM, URETEROSCOPY AND STENT PLACEMENT Left 01/13/2023   Procedure: CYSTOSCOPY WITH RETROGRADE PYELOGRAM, URETEROSCOPY AND STENT PLACEMENT;  Surgeon: Abbie Sons, MD;  Location: [facility] ORS;  Service: Urology;  Laterality: Left;   CYSTOSCOPY/URETEROSCOPY/HOLMIUM LASER/STENT PLACEMENT Left 02/08/2023   Procedure: CYSTOSCOPY/URETEROSCOPY/HOLMIUM LASER/STENT EXCHANGE;  Surgeon: Abbie Sons, MD;  Location: [facility] ORS;  Service: Urology;  Laterality: Left;   ESOPHAGOGASTRODUODENOSCOPY N/A 11/02/2022   Procedure: ESOPHAGOGASTRODUODENOSCOPY (EGD);  Surgeon: Lucilla Lame, MD;  Location[facility] ENDOSCOPY;  Service: Endoscopy;  Laterality: N/A;   EYE SURGERY     GIVENS CAPSULE STUDY N/A 12/22/2022   Procedure: GIVENS CAPSULE STUDY;  Surgeon: Lucilla Lame, MD;  Location: [facility] ENDOSCOPY;  Service: Endoscopy;  Laterality: N/A;   TONSILLECTOMY     TONSILLECTOMY AND ADENOIDECTOMY  1951   WISDOM TOOTH EXTRACTION  1970    SOCIAL HISTORY: Social History   Socioeconomic History   Marital status: Married    Spouse name: Not on file   Number of children: 2   Years of education: Not on file   Highest education level: Some college, no degree  Occupational History   Occupation: retired  Tobacco Use   Smoking status: Never   Smokeless tobacco: Never  Vaping Use   Vaping Use: Never used  Substance and Sexual Activity   Alcohol use: No   Drug use: No   Sexual activity: Yes  Other Topics Concern   Not on file  Social History Narrative   Not on file   Social Determinants of Health   Financial Resource Strain: Low Risk  (04/21/2022)    Overall Financial Resource Strain (CARDIA)    Difficulty of Paying Living Expenses: Not hard at all  Food Insecurity: No Food Insecurity (01/28/2023)   Hunger Vital Sign    Worried About Running Out of Food in the Last Year: Never true    [place] in the Last Year: Never true  Transportation Needs: No Transportation Needs (01/13/2023)   PRAPARE - Hydrologist (Medical): No    Lack of Transportation (Non-Medical): No  Physical Activity: Unknown (04/21/2022)   Exercise Vital Sign    Days of Exercise per Week: Not on file    Minutes of Exercise per Session: 20 min  Stress: No Stress Concern Present (04/21/2022)   [place]    Feeling of Stress : Only a little  Social Connections: Socially Integrated (04/21/2022)   Social Connection and Isolation Panel [NHANES]    Frequency of Communication with Friends and Family: More than three times a week    Frequency of Social Gatherings with Friends and Family: More than three times a week    Attends  Religious Services: More than 4 times per year    Active Member of Clubs or Organizations: Yes    Attends Archivist Meetings: More than 4 times per year    Marital Status: Married  Human resources officer Violence: Not At Risk (01/13/2023)   Humiliation, Afraid, Rape, and Kick questionnaire    Fear of Current or Ex-Partner: No    Emotionally Abused: No    Physically Abused: No    Sexually Abused: No    FAMILY HISTORY Family History  Problem Relation Age of Onset   Cancer Sister        Has had Breast Cancer and is in remission   Breast cancer Sister 25   Congenital heart disease Son     ALLERGIES:  is allergic to sulfa antibiotics, amoxicillin, and penicillins.  MEDICATIONS:  Current Outpatient Medications  Medication Sig Dispense Refill   amiodarone (PACERONE) 200 MG tablet Take 1 tablet (200 mg total) by mouth 2 (two) times daily. 50  tablet 0   apixaban (ELIQUIS) 2.5 MG TABS tablet Take 1 tablet (2.5 mg total) by mouth 2 (two) times daily. 60 tablet 0   atorvastatin (LIPITOR) 40 MG tablet Take 1 tablet (40 mg total) by mouth daily at 6 PM. (Patient taking differently: Take 40 mg by mouth every evening.) 30 tablet 0   ciprofloxacin (CIPRO) 250 MG tablet Take 1 tablet (250 mg total) by mouth 2 (two) times daily. 10 tablet 0   cyanocobalamin 1000 MCG tablet Take 1 tablet (1,000 mcg total) by mouth daily. 90 tablet 1   levothyroxine (SYNTHROID) 75 MCG tablet Take 1 tablet (75 mcg total) by mouth daily. (Patient taking differently: Take 75 mcg by mouth daily before breakfast.) 90 tablet 1   metoprolol succinate (TOPROL-XL) 25 MG 24 hr tablet Take 25 mg by mouth at bedtime.     nitroGLYCERIN (NITROSTAT) 0.4 MG SL tablet Place 1 tablet (0.4 mg total) under the tongue every 5 (five) minutes as needed for chest pain. 30 tablet 2   ondansetron (ZOFRAN) 4 MG tablet Take 4 mg by mouth every morning.     sertraline (ZOLOFT) 100 MG tablet TAKE 1 & 1/2 (ONE & ONE-HALF) TABLETS BY MOUTH ONCE DAILY NEED  TO  SCHEDULE  AN  APPOINTMENT (Patient taking differently: Take 150 mg by mouth every morning. TAKE 1 & 1/2 (ONE & ONE-HALF) TABLETS BY MOUTH ONCE DAILY NEED  TO  SCHEDULE  AN  APPOINTMENT) 135 tablet 3   pantoprazole (PROTONIX) 40 MG tablet Take 1 tablet (40 mg total) by mouth daily before breakfast for 14 days. (Patient not taking: Reported on 03/01/2023) 14 tablet 0   trospium (SANCTURA) 20 MG tablet Take 1 tablet (20 mg total) by mouth 2 (two) times daily as needed (frequency,urgency,bladder spasm). (Patient not taking: Reported on 03/01/2023) 20 tablet 0   Vitamin D, Ergocalciferol, (DRISDOL) 1.25 MG (50000 UNIT) CAPS capsule Take 1 capsule (50,000 Units total) by mouth every 7 (seven) days. (Patient taking differently: Take 50,000 Units by mouth every 7 (seven) days. Saturdays) 12 capsule 0   No current facility-administered medications for this  visit.    PHYSICAL EXAMINATION:  ECOG PERFORMANCE STATUS: 2 - Symptomatic, <50% confined to bed   Vitals:   03/01/23 1400  BP: 132/69  Pulse: (!) 51  Resp: 16  Temp: (!) 96 F (35.6 C)  SpO2: 100%    Filed Weights   03/01/23 1400  Weight: 132 lb (59.9 kg)     Physical Exam  Constitutional:      Appearance: Normal appearance.  HENT:     Head: Normocephalic and atraumatic.  Cardiovascular:     Rate and Rhythm: Normal rate.     Heart sounds: No murmur heard. Pulmonary:     Effort: Pulmonary effort is normal.     Breath sounds: Normal breath sounds.  Abdominal:     Palpations: Abdomen is soft.  Musculoskeletal:     Right lower leg: No edema.     Left lower leg: No edema.  Skin:    General: Skin is warm.  Neurological:     General: No focal deficit present.     Mental Status: She is oriented to person, place, and time.     LABORATORY DATA: I have personally reviewed the data as listed:  Appointment on 03/01/2023  Component Date Value Ref Range Status   Vitamin B-12 03/01/2023 643  180 - 914 pg/mL Final   Comment: (NOTE) This assay is not validated for testing neonatal or myeloproliferative syndrome specimens for Vitamin B12 levels. Performed at [facility], [place] [address], [place], [place] [address]    Iron 03/01/2023 44  28 - 170 ug/dL Final   TIBC 03/01/2023 437  250 - 450 ug/dL Final   Saturation Ratios 03/01/2023 10 (L)  10.4 - 31.8 % Final   UIBC 03/01/2023 393  ug/dL Final   Performed at [facility], [address] [place], [place] [address]   Ferritin 03/01/2023 35  11 - 307 ng/mL Final   Performed at [facility], [address] [place], [place] [address]   WBC 03/01/2023 6.9  4.0 - 10.5 K/uL Final   RBC 03/01/2023 3.91  3.87 - 5.11 MIL/uL Final   Hemoglobin 03/01/2023 9.8 (L)  12.0 - 15.0 g/dL Final   HCT 03/01/2023 32.4 (L)  36.0 - 46.0 % Final   MCV 03/01/2023 82.9  80.0 - 100.0 fL Final   MCH 03/01/2023 25.1  (L)  26.0 - 34.0 pg Final   MCHC 03/01/2023 30.2  30.0 - 36.0 g/dL Final   RDW 03/01/2023 17.4 (H)  11.5 - 15.5 % Final   Platelets 03/01/2023 196  150 - 400 K/uL Final   nRBC 03/01/2023 0.0  0.0 - 0.2 % Final   Neutrophils Relative % 03/01/2023 71  % Final   Neutro Abs 03/01/2023 5.0  1.7 - 7.7 K/uL Final   Lymphocytes Relative 03/01/2023 19  % Final   Lymphs Abs 03/01/2023 1.3  0.7 - 4.0 K/uL Final   Monocytes Relative 03/01/2023 7  % Final   Monocytes Absolute 03/01/2023 0.5  0.1 - 1.0 K/uL Final   Eosinophils Relative 03/01/2023 2  % Final   Eosinophils Absolute 03/01/2023 0.2  0.0 - 0.5 K/uL Final   Basophils Relative 03/01/2023 1  % Final   Basophils Absolute 03/01/2023 0.1  0.0 - 0.1 K/uL Final   Immature Granulocytes 03/01/2023 0  % Final   Abs Immature Granulocytes 03/01/2023 0.02  0.00 - 0.07 K/uL Final   Performed at [facility], [address] [place], [place] [address]  Procedure visit on 02/24/2023  Component Date Value Ref Range Status   Specific Gravity, UA 02/24/2023 1.025  1.005 - 1.030 Final   pH, UA 02/24/2023 6.0  5.0 - 7.5 Final   Color, UA 02/24/2023 Amber (A)  Yellow Final   Appearance Ur 02/24/2023 Cloudy (A)  Clear Final   Leukocytes,UA 02/24/2023 3+ (A)  Negative Final   Protein,UA 02/24/2023  3+ (A)  Negative/Trace Final   Glucose, UA 02/24/2023 Trace (A)  Negative Final   Ketones, UA 02/24/2023 Negative  Negative Final   RBC, UA 02/24/2023 3+ (A)  Negative Final   Bilirubin, UA 02/24/2023 Negative  Negative Final   Urobilinogen, Ur 02/24/2023 0.2  0.2 - 1.0 mg/dL Final   Nitrite, UA 02/24/2023 Positive (A)  Negative Final   Microscopic Examination 02/24/2023 See below:   Final   Urine Culture, Comprehensive 02/24/2023 Final report (A)   Final   Organism ID, Bacteria 02/24/2023 Enterococcus faecalis (A)   Final   Comment: For Enterococcus species, aminoglycosides (except for high-level resistance screening), cephalosporins, clindamycin,  and trimethoprim-sulfamethoxazole are not effective clinically. (CLSI, M100-S26, 2016) 25,000-50,000 colony forming units per mL    Organism ID, Bacteria 02/24/2023 Candida albicans (A)   Final   25,000-50,000 colony forming units per mL   ANTIMICROBIAL SUSCEPTIBILITY 02/24/2023 Comment   Final   Comment:       ** S = Susceptible; I = Intermediate; R = Resistant **                    P = Positive; N = Negative             MICS are expressed in micrograms per mL    Antibiotic                 RSLT#1    RSLT#2    RSLT#3    RSLT#4 Ciprofloxacin                  S Levofloxacin                   S Nitrofurantoin                 S Penicillin                     S Tetracycline                   R Vancomycin                     S    WBC, UA 02/24/2023 >30 (A)  0 - 5 /hpf Final   RBC, Urine 02/24/2023 >30 (A)  0 - 2 /hpf Final   Epithelial Cells (non renal) 02/24/2023 0-10  0 - 10 /hpf Final   Casts 02/24/2023 Present (A)  None seen /lpf Final   Cast Type 02/24/2023 Granular casts (A)  N/A Final   Bacteria, UA 02/24/2023 Many (A)  None seen/Few Final  Admission on 02/19/2023, Discharged on 02/19/2023  Component Date Value Ref Range Status   WBC 02/19/2023 11.2 (H)  4.0 - 10.5 K/uL Final   RBC 02/19/2023 4.61  3.87 - 5.11 MIL/uL Final   Hemoglobin 02/19/2023 11.4 (L)  12.0 - 15.0 g/dL Final   HCT 02/19/2023 37.4  36.0 - 46.0 % Final   MCV 02/19/2023 81.1  80.0 - 100.0 fL Final   MCH 02/19/2023 24.7 (L)  26.0 - 34.0 pg Final   MCHC 02/19/2023 30.5  30.0 - 36.0 g/dL Final   RDW 02/19/2023 16.9 (H)  11.5 - 15.5 % Final   Platelets 02/19/2023 237  150 - 400 K/uL Final   nRBC 02/19/2023 0.0  0.0 - 0.2 % Final   Neutrophils Relative % 02/19/2023 83  % Final   Neutro Abs 02/19/2023 9.3 (H)  1.7 - 7.7  K/uL Final   Lymphocytes Relative 02/19/2023 10  % Final   Lymphs Abs 02/19/2023 1.1  0.7 - 4.0 K/uL Final   Monocytes Relative 02/19/2023 5  % Final   Monocytes Absolute 02/19/2023 0.5  0.1 -  1.0 K/uL Final   Eosinophils Relative 02/19/2023 1  % Final   Eosinophils Absolute 02/19/2023 0.1  0.0 - 0.5 K/uL Final   Basophils Relative 02/19/2023 1  % Final   Basophils Absolute 02/19/2023 0.1  0.0 - 0.1 K/uL Final   Immature Granulocytes 02/19/2023 0  % Final   Abs Immature Granulocytes 02/19/2023 0.03  0.00 - 0.07 K/uL Final   Performed at [facility], [place], [place] [address]   Sodium 02/19/2023 141  135 - 145 mmol/L Final   Potassium 02/19/2023 3.1 (L)  3.5 - 5.1 mmol/L Final   Chloride 02/19/2023 104  98 - 111 mmol/L Final   CO2 02/19/2023 25  22 - 32 mmol/L Final   Glucose, Bld 02/19/2023 128 (H)  70 - 99 mg/dL Final   Glucose reference range applies only to samples taken after fasting for at least 8 hours.   BUN 02/19/2023 16  8 - 23 mg/dL Final   Creatinine, Ser 02/19/2023 1.51 (H)  0.44 - 1.00 mg/dL Final   Calcium 02/19/2023 9.0  8.9 - 10.3 mg/dL Final   Total Protein 02/19/2023 7.1  6.5 - 8.1 g/dL Final   Albumin 02/19/2023 3.7  3.5 - 5.0 g/dL Final   AST 02/19/2023 35  15 - 41 U/L Final   ALT 02/19/2023 17  0 - 44 U/L Final   Alkaline Phosphatase 02/19/2023 88  38 - 126 U/L Final   Total Bilirubin 02/19/2023 1.2  0.3 - 1.2 mg/dL Final   GFR, Estimated 02/19/2023 35 (L)  >60 mL/min Final   Comment: (NOTE) Calculated using the CKD-EPI Creatinine Equation (2021)    Anion gap 02/19/2023 12  5 - 15 Final   Performed at [facility], [address] [place], [place] [address]  Lab on 02/10/2023  Component Date Value Ref Range Status   Glucose 02/10/2023 100 (H)  70 - 99 mg/dL Final   BUN 02/10/2023 23  8 - 27 mg/dL Final   Creatinine, Ser 02/10/2023 2.47 (H)  0.57 - 1.00 mg/dL Final   eGFR 02/10/2023 19 (L)  >59 mL/min/1.73 Final   BUN/Creatinine Ratio 02/10/2023 9 (L)  12 - 28 Final   Sodium 02/10/2023 144  134 - 144 mmol/L Final   Potassium 02/10/2023 3.7  3.5 - 5.2 mmol/L Final   Chloride 02/10/2023 106  96 - 106 mmol/L Final    CO2 02/10/2023 20  20 - 29 mmol/L Final   Calcium 02/10/2023 8.6 (L)  8.7 - 10.3 mg/dL Final  Admission on 02/08/2023, Discharged on 02/08/2023  Component Date Value Ref Range Status   Source Calculi 02/08/2023 Comment   Corrected   Comment: Left Ureter CORRECTED ON 02/21 AT 1236: PREVIOUSLY REPORTED AS KIDNEY STONE    Color Calculi 02/08/2023 [employer]   Final   Size Calculi 02/08/2023 4x3  mm Final   Comment: (NOTE) Multiple pieces received.  Dimensions of the largest piece reported.    Weight Calculi 02/08/2023 56  mg Corrected   Composition Calculi 02/08/2023 Comment   Corrected   Percentage (Represents the % composition)   Calcium Oxalate Monohydrate 02/08/2023 80  % Corrected   Calcium Oxalate Dihydrate 02/08/2023 20  % Corrected   Comment Calculi 2 02/08/2023 Comment  Corrected   Comment: (NOTE) Calculus received wet. Wet calculi must be dried before analysis, which delays reporting of results. Leaving calculi wet (such as water, saline, blood, urine) may lead to changes in composition.    Photo Calculi 02/08/2023 Comment   Corrected   Photograph will follow under a separate cover   Comment Calculi 3 02/08/2023 Comment   Corrected   Comment: (NOTE) Physician questions regarding Calculi Analysis contact [facility] at: [phone].    Please Note: 02/08/2023 Comment   Corrected   Comment: (NOTE) Calculi report will follow via computer, mail or courier delivery.    DISCLAIMER: 02/08/2023 Comment   Corrected   Comment: (NOTE) This test was developed and its performance characteristics determined by [facility].  It has not been cleared or approved by the Food and Drug Administration. Performed At: [facility] [place], [place] [id number] Wylene Simmer PhDDA [id number]     ASSESSMENT/PLAN Tara Marquez is a 82 yo F with pmh of Afib, hypothyroidism HTN and HLD who was referred to Hematology for management of iron deficiency anemia.   #Iron  deficiency anemia -Secondary to microscopic hematuria -Received IV Feraheme and 4 doses so far.  Hemoglobin trended down again to 9.8 with saturation 10%.  Will proceed with IV Feraheme weekly x 2 doses.  Repeat iron levels in 4 months.  -Colonoscopy done in Jan 2023 showed non bleeding internal hemorrhoids only.  She was also found to have microscopic hematuria.  Follows with Dr. Bernardo Heater.  Cystoscopy was unremarkable. CT abdomen with and without contrast showed bilateral nephrolithiasis with largest kidney stone in the inferior pole measuring 8 mm and 2 punctate stones identified within the upper and lower pole of right kidney.  She had endoscopy with Dr. Allen Norris on 11/02/2022 which showed small hiatal hernia and benign polyp in stomach with no evidence of bleeding.   # Bilateral kidney stone - 01/13/2023-admitted with obstructive uropathy with superimposed pyelonephritis on the left side status post ureteral stent placement by Dr. Bernardo Heater.  Urine culture showed Klebsiella pneumoniae.  Treated with antibiotics.  She had cystoscopy, left ureteroscopy and stone removal.  Laser lithotripsy and stent exchange.  # A-fib -On Eliquis.  Dose was decreased to 2.5 mg twice daily by cardiology due to hematuria  IV Feraheme today.  Scheduled for second dose next week RTC in 3 months for MD visit, labs, possible Feraheme  Orders Placed This Encounter  Procedures   CBC with Differential/Platelet    Standing Status:   Future    Standing Expiration Date:   02/29/2024   Iron and TIBC    Standing Status:   Future    Standing Expiration Date:   02/29/2024   Ferritin    Standing Status:   Future    Standing Expiration Date:   02/29/2024    All questions were answered. The patient knows to call the clinic with any problems, questions or concerns.  This note was electronically signed.    Jane Canary, MD  03/03/2023 9:09 AM

## 2023-03-03 NOTE — Telephone Encounter (Signed)
-----   Message from Nori Riis, PA-C sent at 03/03/2023  9:31 AM EST ----- Please let Tara Marquez know that her urine culture returned positive for infection.  She has probably finished the Cipro by now and that is good because the bacteria was sensitive to it, but she also grew out yeast. Is she having symptoms of a vaginal yeast infection (discharge, burning, itching, etc.) or if she having symptoms of a UTI?

## 2023-03-03 NOTE — Telephone Encounter (Signed)
Patient notified and voiced udnerstanding. She states she is not having any UTI of vaginal symptoms. She states she feels fine.

## 2023-03-04 NOTE — Telephone Encounter (Signed)
Patient's husband Glendell Docker called and wanted his wife to be seen soon since she is finished with the ABX to get the stent out. I booked her for Friday 3/15 that was soonest appointment available.

## 2023-03-08 ENCOUNTER — Inpatient Hospital Stay: Payer: Medicare Other

## 2023-03-08 VITALS — BP 148/67 | HR 65 | Temp 98.9°F | Resp 14

## 2023-03-08 DIAGNOSIS — Z7901 Long term (current) use of anticoagulants: Secondary | ICD-10-CM | POA: Diagnosis not present

## 2023-03-08 DIAGNOSIS — N200 Calculus of kidney: Secondary | ICD-10-CM | POA: Diagnosis not present

## 2023-03-08 DIAGNOSIS — D509 Iron deficiency anemia, unspecified: Secondary | ICD-10-CM

## 2023-03-08 DIAGNOSIS — D500 Iron deficiency anemia secondary to blood loss (chronic): Secondary | ICD-10-CM | POA: Diagnosis not present

## 2023-03-08 DIAGNOSIS — R3129 Other microscopic hematuria: Secondary | ICD-10-CM | POA: Diagnosis not present

## 2023-03-08 DIAGNOSIS — I4891 Unspecified atrial fibrillation: Secondary | ICD-10-CM | POA: Diagnosis not present

## 2023-03-08 MED ORDER — SODIUM CHLORIDE 0.9 % IV SOLN
510.0000 mg | Freq: Once | INTRAVENOUS | Status: AC
  Administered 2023-03-08: 510 mg via INTRAVENOUS
  Filled 2023-03-08: qty 510

## 2023-03-08 NOTE — Patient Instructions (Signed)

## 2023-03-11 ENCOUNTER — Encounter: Payer: Self-pay | Admitting: Urology

## 2023-03-11 ENCOUNTER — Ambulatory Visit (INDEPENDENT_AMBULATORY_CARE_PROVIDER_SITE_OTHER): Payer: Medicare Other | Admitting: Urology

## 2023-03-11 VITALS — BP 165/78 | HR 57 | Ht 65.0 in | Wt 126.0 lb

## 2023-03-11 DIAGNOSIS — N201 Calculus of ureter: Secondary | ICD-10-CM | POA: Diagnosis not present

## 2023-03-11 DIAGNOSIS — R8271 Bacteriuria: Secondary | ICD-10-CM | POA: Diagnosis not present

## 2023-03-11 DIAGNOSIS — Z466 Encounter for fitting and adjustment of urinary device: Secondary | ICD-10-CM | POA: Diagnosis not present

## 2023-03-11 LAB — URINALYSIS, COMPLETE
Bilirubin, UA: NEGATIVE
Glucose, UA: NEGATIVE
Ketones, UA: NEGATIVE
Nitrite, UA: POSITIVE — AB
Specific Gravity, UA: 1.02 (ref 1.005–1.030)
Urobilinogen, Ur: 0.2 mg/dL (ref 0.2–1.0)
pH, UA: 6 (ref 5.0–7.5)

## 2023-03-11 LAB — MICROSCOPIC EXAMINATION
RBC, Urine: >30 /hpf — AB (ref 0–2)
WBC, UA: >30 /hpf — AB (ref 0–5)

## 2023-03-11 MED ORDER — CIPROFLOXACIN HCL 500 MG PO TABS
500.0000 mg | ORAL_TABLET | Freq: Once | ORAL | Status: AC
  Administered 2023-03-11: 500 mg via ORAL

## 2023-03-11 MED ORDER — AMOXICILLIN 875 MG PO TABS: 875.0000 mg | ORAL_TABLET | Freq: Two times a day (BID) | ORAL | 0 refills | Status: AC

## 2023-03-11 NOTE — Progress Notes (Signed)
   Indications: Patient is 82 y.o., who is s/p ureteroscopic removal of a left distal ureteral calculus 02/08/2023.  The patient is presenting today for stent removal.  Stone analysis CaOxMono/CaOxDi 80/20.  She has had some nausea the past 2 days.  No fever or chills  Procedure:  Flexible Cystoscopy with stent removal [id number])  Timeout was performed and the correct patient, procedure and participants were identified.    Description:  The patient was prepped and draped in the usual sterile fashion. Flexible cystosopy was performed.  The stent was visualized, grasped, and removed intact without difficulty. The patient tolerated the procedure well.  A single dose of oral antibiotics was given.  Complications:  None  Plan:  Call for fever/flank pain post stent removal CT did show a small nonobstructing right renal calculus Follow-up 6 months with [facility].  Call earlier for symptoms indicative of recurrent stone pain/renal colic Urine culture was ordered and will start amoxicillin 875 mg twice daily x 5 days Contact PCP if nausea persists   John Giovanni, MD

## 2023-03-15 LAB — CULTURE, URINE COMPREHENSIVE

## 2023-03-16 DIAGNOSIS — N138 Other obstructive and reflux uropathy: Secondary | ICD-10-CM | POA: Diagnosis not present

## 2023-03-16 DIAGNOSIS — F320 Major depressive disorder, single episode, mild: Secondary | ICD-10-CM | POA: Diagnosis not present

## 2023-03-16 DIAGNOSIS — I480 Paroxysmal atrial fibrillation: Secondary | ICD-10-CM | POA: Diagnosis not present

## 2023-03-16 DIAGNOSIS — N200 Calculus of kidney: Secondary | ICD-10-CM | POA: Diagnosis not present

## 2023-03-16 DIAGNOSIS — N179 Acute kidney failure, unspecified: Secondary | ICD-10-CM | POA: Diagnosis not present

## 2023-03-16 DIAGNOSIS — K219 Gastro-esophageal reflux disease without esophagitis: Secondary | ICD-10-CM | POA: Diagnosis not present

## 2023-03-16 DIAGNOSIS — R110 Nausea: Secondary | ICD-10-CM | POA: Diagnosis not present

## 2023-03-16 DIAGNOSIS — D509 Iron deficiency anemia, unspecified: Secondary | ICD-10-CM | POA: Diagnosis not present

## 2023-03-16 DIAGNOSIS — I10 Essential (primary) hypertension: Secondary | ICD-10-CM | POA: Diagnosis not present

## 2023-03-16 DIAGNOSIS — R944 Abnormal results of kidney function studies: Secondary | ICD-10-CM | POA: Diagnosis not present

## 2023-03-16 DIAGNOSIS — N10 Acute tubulo-interstitial nephritis: Secondary | ICD-10-CM | POA: Diagnosis not present

## 2023-03-16 DIAGNOSIS — E538 Deficiency of other specified B group vitamins: Secondary | ICD-10-CM | POA: Diagnosis not present

## 2023-03-18 ENCOUNTER — Encounter: Payer: Self-pay | Admitting: *Deleted

## 2023-03-18 ENCOUNTER — Ambulatory Visit: Payer: Medicare Other | Admitting: Urology

## 2023-03-22 ENCOUNTER — Other Ambulatory Visit: Payer: Self-pay | Admitting: Family Medicine

## 2023-03-22 DIAGNOSIS — R8271 Bacteriuria: Secondary | ICD-10-CM

## 2023-03-23 ENCOUNTER — Encounter: Payer: Self-pay | Admitting: Internal Medicine

## 2023-03-23 ENCOUNTER — Other Ambulatory Visit: Payer: Medicare Other

## 2023-03-23 DIAGNOSIS — R8271 Bacteriuria: Secondary | ICD-10-CM

## 2023-03-23 LAB — URINALYSIS, COMPLETE
Bilirubin, UA: NEGATIVE
Glucose, UA: NEGATIVE
Nitrite, UA: NEGATIVE
Specific Gravity, UA: 1.02 (ref 1.005–1.030)
Urobilinogen, Ur: 0.2 mg/dL (ref 0.2–1.0)
pH, UA: 5.5 (ref 5.0–7.5)

## 2023-03-23 LAB — MICROSCOPIC EXAMINATION
Epithelial Cells (non renal): >10 /hpf — AB (ref 0–10)
WBC, UA: >30 /hpf — AB (ref 0–5)

## 2023-03-24 ENCOUNTER — Telehealth (INDEPENDENT_AMBULATORY_CARE_PROVIDER_SITE_OTHER): Payer: Self-pay | Admitting: Vascular Surgery

## 2023-03-24 NOTE — Telephone Encounter (Signed)
I spoke with pt to try and get her to schedule her CT. I had left several messages but was able to speak with her. She states that she has had other health problems arise and doesn't feel that she needs the contrast right now. I advised that we would let Dr. Lucky Cowboy know. She states that she will call us back when she is ready to schedule.

## 2023-03-24 NOTE — Telephone Encounter (Signed)
Called patient. It went straight to voicemail. Will call again when we return back to work.

## 2023-03-24 NOTE — Telephone Encounter (Signed)
Let her know that Dr. Lucky Cowboy was concerned about the growth of her aneurysm.  If we can check back in with her in a month or so to see if she would be ready to move forward then.

## 2023-03-26 LAB — CULTURE, URINE COMPREHENSIVE

## 2023-03-28 ENCOUNTER — Telehealth: Payer: Self-pay | Admitting: *Deleted

## 2023-03-28 NOTE — Telephone Encounter (Signed)
-----   Message from Abbie Sons, MD sent at 03/28/2023  7:41 AM EDT ----- Repeat urine culture positive for bacteria however would not recommend treatment unless she is having UTI symptoms

## 2023-03-29 NOTE — Telephone Encounter (Signed)
Danna said she will call back after she check her schedule to make an appt

## 2023-03-29 NOTE — Telephone Encounter (Signed)
Spoke with patient and advised results, she is not having any symptoms but was worried about the " staph"  advised pt again no treatment necessary.

## 2023-04-15 DIAGNOSIS — E782 Mixed hyperlipidemia: Secondary | ICD-10-CM | POA: Diagnosis not present

## 2023-04-15 DIAGNOSIS — I480 Paroxysmal atrial fibrillation: Secondary | ICD-10-CM | POA: Diagnosis not present

## 2023-04-15 DIAGNOSIS — I7123 Aneurysm of the descending thoracic aorta, without rupture: Secondary | ICD-10-CM | POA: Diagnosis not present

## 2023-04-15 DIAGNOSIS — I10 Essential (primary) hypertension: Secondary | ICD-10-CM | POA: Diagnosis not present

## 2023-04-15 DIAGNOSIS — I493 Ventricular premature depolarization: Secondary | ICD-10-CM | POA: Diagnosis not present

## 2023-04-18 DIAGNOSIS — H2640 Unspecified secondary cataract: Secondary | ICD-10-CM | POA: Diagnosis not present

## 2023-04-18 DIAGNOSIS — H35372 Puckering of macula, left eye: Secondary | ICD-10-CM | POA: Diagnosis not present

## 2023-04-18 DIAGNOSIS — H3589 Other specified retinal disorders: Secondary | ICD-10-CM | POA: Diagnosis not present

## 2023-04-21 DIAGNOSIS — I7123 Aneurysm of the descending thoracic aorta, without rupture: Secondary | ICD-10-CM | POA: Diagnosis not present

## 2023-04-21 DIAGNOSIS — R2681 Unsteadiness on feet: Secondary | ICD-10-CM | POA: Diagnosis not present

## 2023-04-21 DIAGNOSIS — N138 Other obstructive and reflux uropathy: Secondary | ICD-10-CM | POA: Diagnosis not present

## 2023-04-21 DIAGNOSIS — E876 Hypokalemia: Secondary | ICD-10-CM | POA: Diagnosis not present

## 2023-04-21 DIAGNOSIS — I480 Paroxysmal atrial fibrillation: Secondary | ICD-10-CM | POA: Diagnosis not present

## 2023-04-21 DIAGNOSIS — R634 Abnormal weight loss: Secondary | ICD-10-CM | POA: Diagnosis not present

## 2023-04-21 DIAGNOSIS — F320 Major depressive disorder, single episode, mild: Secondary | ICD-10-CM | POA: Diagnosis not present

## 2023-04-21 DIAGNOSIS — D509 Iron deficiency anemia, unspecified: Secondary | ICD-10-CM | POA: Diagnosis not present

## 2023-04-21 DIAGNOSIS — I10 Essential (primary) hypertension: Secondary | ICD-10-CM | POA: Diagnosis not present

## 2023-04-21 DIAGNOSIS — Z8739 Personal history of other diseases of the musculoskeletal system and connective tissue: Secondary | ICD-10-CM | POA: Diagnosis not present

## 2023-04-21 DIAGNOSIS — M353 Polymyalgia rheumatica: Secondary | ICD-10-CM | POA: Diagnosis not present

## 2023-04-28 ENCOUNTER — Other Ambulatory Visit: Payer: Self-pay | Admitting: Family Medicine

## 2023-05-30 MED FILL — Ferumoxytol Inj 510 MG/17ML (30 MG/ML) (Elemental Fe): INTRAVENOUS | Qty: 17 | Status: AC

## 2023-05-31 ENCOUNTER — Inpatient Hospital Stay: Payer: Medicare Other

## 2023-05-31 ENCOUNTER — Inpatient Hospital Stay (HOSPITAL_BASED_OUTPATIENT_CLINIC_OR_DEPARTMENT_OTHER): Payer: Medicare Other | Admitting: Internal Medicine

## 2023-05-31 ENCOUNTER — Encounter: Payer: Self-pay | Admitting: Internal Medicine

## 2023-05-31 ENCOUNTER — Inpatient Hospital Stay: Payer: Medicare Other | Attending: Internal Medicine

## 2023-05-31 VITALS — BP 155/85 | HR 77 | Temp 96.8°F | Wt 129.9 lb

## 2023-05-31 DIAGNOSIS — D500 Iron deficiency anemia secondary to blood loss (chronic): Secondary | ICD-10-CM

## 2023-05-31 DIAGNOSIS — I4891 Unspecified atrial fibrillation: Secondary | ICD-10-CM | POA: Diagnosis not present

## 2023-05-31 DIAGNOSIS — D509 Iron deficiency anemia, unspecified: Secondary | ICD-10-CM | POA: Insufficient documentation

## 2023-05-31 DIAGNOSIS — Z79899 Other long term (current) drug therapy: Secondary | ICD-10-CM | POA: Diagnosis not present

## 2023-05-31 DIAGNOSIS — E785 Hyperlipidemia, unspecified: Secondary | ICD-10-CM | POA: Diagnosis not present

## 2023-05-31 DIAGNOSIS — I10 Essential (primary) hypertension: Secondary | ICD-10-CM | POA: Diagnosis not present

## 2023-05-31 DIAGNOSIS — E039 Hypothyroidism, unspecified: Secondary | ICD-10-CM | POA: Diagnosis not present

## 2023-05-31 DIAGNOSIS — Z7901 Long term (current) use of anticoagulants: Secondary | ICD-10-CM | POA: Diagnosis not present

## 2023-05-31 LAB — CBC WITH DIFFERENTIAL/PLATELET
Abs Immature Granulocytes: 0.01 10*3/uL (ref 0.00–0.07)
Basophils Absolute: 0.1 10*3/uL (ref 0.0–0.1)
Basophils Relative: 1 %
Eosinophils Absolute: 0.2 10*3/uL (ref 0.0–0.5)
Eosinophils Relative: 2 %
HCT: 36.5 % (ref 36.0–46.0)
Hemoglobin: 11.6 g/dL — ABNORMAL LOW (ref 12.0–15.0)
Immature Granulocytes: 0 %
Lymphocytes Relative: 17 %
Lymphs Abs: 1.4 10*3/uL (ref 0.7–4.0)
MCH: 28.6 pg (ref 26.0–34.0)
MCHC: 31.8 g/dL (ref 30.0–36.0)
MCV: 89.9 fL (ref 80.0–100.0)
Monocytes Absolute: 0.5 10*3/uL (ref 0.1–1.0)
Monocytes Relative: 6 %
Neutro Abs: 5.9 10*3/uL (ref 1.7–7.7)
Neutrophils Relative %: 74 %
Platelets: 175 10*3/uL (ref 150–400)
RBC: 4.06 MIL/uL (ref 3.87–5.11)
RDW: 16.9 % — ABNORMAL HIGH (ref 11.5–15.5)
WBC: 7.9 10*3/uL (ref 4.0–10.5)
nRBC: 0 % (ref 0.0–0.2)

## 2023-05-31 LAB — FERRITIN: Ferritin: 197 ng/mL (ref 11–307)

## 2023-05-31 LAB — IRON AND TIBC
Iron: 71 ug/dL (ref 28–170)
Saturation Ratios: 22 % (ref 10.4–31.8)
TIBC: 319 ug/dL (ref 250–450)
UIBC: 248 ug/dL

## 2023-05-31 NOTE — Progress Notes (Addendum)
[facility] Cancer Initial Visit:  Patient Care Team: Bosie Clos, MD as PCP - General (Family Medicine) Galen Manila, MD as Referring Physician (Ophthalmology) Lamar Blinks, MD as Consulting Physician (Cardiology) Debbrah Alar, MD as Consulting Physician (Dermatology) Kemper Durie, RN as [facility] Care Management Michaelyn Barter, MD as Consulting Physician (Oncology)  CHIEF COMPLAINTS/PURPOSE OF CONSULTATION:  Oncology History   No history exists.    HISTORY OF PRESENTING ILLNESS: Tara Marquez 82 y.o. female is here because of iron deficiency anemia.   She has positive cologaurd test in Oct 2022. Colonoscopy done in Jan 2023 showed non bleeding internal hemorrhoids only. CBC showed worsening anemia with recent Hb 9.5. MCV 76. WBC and platelets normal. Iron panel from July 2023 showed ferritin 24, saturation 7%.  She received IV Feraheme x2 doses completed on 08/12/2022.  She was also found to have microscopic hematuria.  Follows with Dr. Lonna Cobb.  Cystoscopy was unremarkable. CT abdomen with and without contrast showed bilateral nephrolithiasis with largest kidney stone in the inferior pole measuring 8 mm and 2 punctate stones identified within the upper and lower pole of right kidney.  She had endoscopy with Dr. Servando Snare on 11/02/2022 which showed small hiatal hernia and benign polyp in stomach with no evidence of bleeding.  11/03/2022- Repeat iron panel from  showed significant drop and iron levels with ferritin of 17 and saturation 6%.  Treated with IV Feraheme x 2  01/13/2023-admitted with obstructive uropathy with superimposed pyelonephritis on the left side status post ureteral stent placement by Dr. Lonna Cobb.  Urine culture showed Klebsiella pneumoniae.  Treated with antibiotics.  She had cystoscopy, left ureteroscopy and stone removal.  Laser lithotripsy and stent exchange.  03/01/23-seen in hematology clinic.  Hemoglobin down to 9.8 again  with iron panel consistent with iron deficiency. S/p IV Feraheme x 2  03/11/2023- s/p flexible cystoscopy with stent removal by Dr. Lonna Cobb   INTERVAL HISTORY-  Patient was seen today for follow-up labs and possible iron infusion. Since the hospital discharge from kidney infection in January, she has been feeling off balance.  About 3 to 4 weeks ago, she had an episode of fall where she landed on the buttock and then hit her head.  No neurological symptoms.  Denies any bleeding in urine or stools.  She is on Eliquis 2.5 mg twice daily.  She has discussed this with Dr. Sullivan Lone her primary who was working on referral to physical therapy.  She has not heard back yet.  She is scheduled to see Dr. Sullivan Lone later this month and will discuss again further.   Review of Systems  Constitutional:  Positive for fatigue. Negative for appetite change and diaphoresis.  Respiratory:  Negative for cough and shortness of breath.   Cardiovascular:  Negative for chest pain and leg swelling.  Gastrointestinal:  Negative for abdominal pain.  Neurological:  Negative for dizziness.   Positive ROS as above Rest 10 points ROS negative  MEDICAL HISTORY: Past Medical History:  Diagnosis Date   Acute pyelonephritis    Adaptive colitis    Anemia    Aortic atherosclerosis (HCC)    Atrial fibrillation and flutter (HCC)    a.) CHA2DS2VASc = 5 (age x 2, sex, HTN, vascular disease history);  b.) rate/rhythm maintained on oral amiodarone + metoprolol succinate; chronically anticoagulated with dose reduced apixaban   CAD (coronary artery disease)    Depression    Descending thoracic aortic aneurysm (HCC)    a.) CT  chest 09/29/2022: 4.9 cm; b.) CT abd 01/13/2023: 4.9 cm   Frequent PVCs    GERD (gastroesophageal reflux disease)    Hepatic steatosis    History of 2019 novel coronavirus disease (COVID-19) 12/04/2021   Hyperlipidemia    Hypertension    Hypokalemia    Hypothyroidism    Long term current use of  amiodarone    Long term current use of anticoagulant    a.) dose reduced apixaban   Nephrolithiasis    Osteopenia    Polymyalgia rheumatica (HCC)    Skin cancer    Splenomegaly    Temporal arteritis (HCC)    Vitamin D deficiency     SURGICAL HISTORY: Past Surgical History:  Procedure Laterality Date   ABDOMINAL HYSTERECTOMY  03/1971   Status Post Hysterectomy   CATARACT EXTRACTION Left    COLONOSCOPY     2010   COLONOSCOPY WITH PROPOFOL N/A 01/12/2022   Procedure: COLONOSCOPY WITH PROPOFOL;  Surgeon: Midge Minium, MD;  Location: [facility] ENDOSCOPY;  Service: Endoscopy;  Laterality: N/A;   CYSTOSCOPY WITH RETROGRADE PYELOGRAM, URETEROSCOPY AND STENT PLACEMENT Left 01/13/2023   Procedure: CYSTOSCOPY WITH RETROGRADE PYELOGRAM, URETEROSCOPY AND STENT PLACEMENT;  Surgeon: Riki Altes, MD;  Location: [facility] ORS;  Service: Urology;  Laterality: Left;   CYSTOSCOPY/URETEROSCOPY/HOLMIUM LASER/STENT PLACEMENT Left 02/08/2023   Procedure: CYSTOSCOPY/URETEROSCOPY/HOLMIUM LASER/STENT EXCHANGE;  Surgeon: Riki Altes, MD;  Location: [facility] ORS;  Service: Urology;  Laterality: Left;   ESOPHAGOGASTRODUODENOSCOPY N/A 11/02/2022   Procedure: ESOPHAGOGASTRODUODENOSCOPY (EGD);  Surgeon: Midge Minium, MD;  Location: [facility] ENDOSCOPY;  Service: Endoscopy;  Laterality: N/A;   EYE SURGERY     GIVENS CAPSULE STUDY N/A 12/22/2022   Procedure: GIVENS CAPSULE STUDY;  Surgeon: Midge Minium, MD;  Location: [facility] ENDOSCOPY;  Service: Endoscopy;  Laterality: N/A;   TONSILLECTOMY     TONSILLECTOMY AND ADENOIDECTOMY  1951   WISDOM TOOTH EXTRACTION  1970    SOCIAL HISTORY: Social History   Socioeconomic History   Marital status: Married    Spouse name: Not on file   Number of children: 2   Years of education: Not on file   Highest education level: Some college, no degree  Occupational History   Occupation: retired  Tobacco Use   Smoking status: Never   Smokeless tobacco: Never  Vaping Use   Vaping Use:  Never used  Substance and Sexual Activity   Alcohol use: No   Drug use: No   Sexual activity: Yes  Other Topics Concern   Not on file  Social History Narrative   Not on file   Social Determinants of Health   Financial Resource Strain: Low Risk  (04/21/2022)   Overall Financial Resource Strain (CARDIA)    Difficulty of Paying Living Expenses: Not hard at all  Food Insecurity: No Food Insecurity (01/28/2023)   Hunger Vital Sign    Worried About Running Out of Food in the Last Year: Never true    Ran Out of Food in the Last Year: Never true  Transportation Needs: No Transportation Needs (01/13/2023)   PRAPARE - Administrator, Civil Service (Medical): No    Lack of Transportation (Non-Medical): No  Physical Activity: Unknown (04/21/2022)   Exercise Vital Sign    Days of Exercise per Week: Not on file    Minutes of Exercise per Session: 20 min  Stress: No Stress Concern Present (04/21/2022)   [employer] of Occupational Health - Occupational Stress Questionnaire    Feeling of Stress : Only a little  Social Connections: Socially Integrated (04/21/2022)   Social Connection and Isolation Panel [NHANES]    Frequency of Communication with Friends and Family: More than three times a week    Frequency of Social Gatherings with Friends and Family: More than three times a week    Attends Religious Services: More than 4 times per year    Active Member of [employer] or Organizations: Yes    Attends Engineer, structural: More than 4 times per year    Marital Status: Married  Catering manager Violence: Not At Risk (01/13/2023)   Humiliation, Afraid, Rape, and Kick questionnaire    Fear of Current or Ex-Partner: No    Emotionally Abused: No    Physically Abused: No    Sexually Abused: No    FAMILY HISTORY Family History  Problem Relation Age of Onset   Cancer Sister        Has had Breast Cancer and is in remission   Breast cancer Sister 61   Congenital heart disease Son      ALLERGIES:  is allergic to sulfa antibiotics, amoxicillin, and penicillins.  MEDICATIONS:  Current Outpatient Medications  Medication Sig Dispense Refill   amiodarone (PACERONE) 200 MG tablet Take 1 tablet (200 mg total) by mouth 2 (two) times daily. 50 tablet 0   atorvastatin (LIPITOR) 40 MG tablet Take 1 tablet (40 mg total) by mouth daily at 6 PM. (Patient taking differently: Take 40 mg by mouth every evening.) 30 tablet 0   cyanocobalamin 1000 MCG tablet Take 1 tablet (1,000 mcg total) by mouth daily. 90 tablet 1   levothyroxine (SYNTHROID) 75 MCG tablet Take 1 tablet (75 mcg total) by mouth daily. (Patient taking differently: Take 75 mcg by mouth daily before breakfast.) 90 tablet 1   metoprolol succinate (TOPROL-XL) 25 MG 24 hr tablet Take 25 mg by mouth at bedtime.     nitroGLYCERIN (NITROSTAT) 0.4 MG SL tablet Place 1 tablet (0.4 mg total) under the tongue every 5 (five) minutes as needed for chest pain. 30 tablet 2   ondansetron (ZOFRAN) 4 MG tablet Take 4 mg by mouth every morning.     sertraline (ZOLOFT) 100 MG tablet TAKE 1 & 1/2 (ONE & ONE-HALF) TABLETS BY MOUTH ONCE DAILY NEED  TO  SCHEDULE  AN  APPOINTMENT (Patient taking differently: Take 150 mg by mouth every morning. TAKE 1 & 1/2 (ONE & ONE-HALF) TABLETS BY MOUTH ONCE DAILY NEED  TO  SCHEDULE  AN  APPOINTMENT) 135 tablet 3   apixaban (ELIQUIS) 2.5 MG TABS tablet Take 1 tablet (2.5 mg total) by mouth 2 (two) times daily. 60 tablet 0   No current facility-administered medications for this visit.    PHYSICAL EXAMINATION:  ECOG PERFORMANCE STATUS: 2 - Symptomatic, <50% confined to bed   Vitals:   05/31/23 1332  BP: (!) 155/85  Pulse: 77  Temp: (!) 96.8 F (36 C)  SpO2: 99%     Filed Weights   05/31/23 1332  Weight: 129 lb 14.4 oz (58.9 kg)   Physical exam not performed. No specific concerns endorsed.   LABORATORY DATA: I have personally reviewed the data as listed:  Appointment on 05/31/2023  Component  Date Value Ref Range Status   Ferritin 05/31/2023 197  11 - 307 ng/mL Final   Performed at [facility], [address] [place], [place] [address]   Iron 05/31/2023 71  28 - 170 ug/dL Final   TIBC 46/96/2952 319  250 - 450 ug/dL Final  Saturation Ratios 05/31/2023 22  10.4 - 31.8 % Final   UIBC 05/31/2023 248  ug/dL Final   Performed at [facility], [address] [place], [place] [address]   WBC 05/31/2023 7.9  4.0 - 10.5 K/uL Final   RBC 05/31/2023 4.06  3.87 - 5.11 MIL/uL Final   Hemoglobin 05/31/2023 11.6 (L)  12.0 - 15.0 g/dL Final   HCT 19/14/7829 36.5  36.0 - 46.0 % Final   MCV 05/31/2023 89.9  80.0 - 100.0 fL Final   MCH 05/31/2023 28.6  26.0 - 34.0 pg Final   MCHC 05/31/2023 31.8  30.0 - 36.0 g/dL Final   RDW 56/21/3086 16.9 (H)  11.5 - 15.5 % Final   Platelets 05/31/2023 175  150 - 400 K/uL Final   nRBC 05/31/2023 0.0  0.0 - 0.2 % Final   Neutrophils Relative % 05/31/2023 74  % Final   Neutro Abs 05/31/2023 5.9  1.7 - 7.7 K/uL Final   Lymphocytes Relative 05/31/2023 17  % Final   Lymphs Abs 05/31/2023 1.4  0.7 - 4.0 K/uL Final   Monocytes Relative 05/31/2023 6  % Final   Monocytes Absolute 05/31/2023 0.5  0.1 - 1.0 K/uL Final   Eosinophils Relative 05/31/2023 2  % Final   Eosinophils Absolute 05/31/2023 0.2  0.0 - 0.5 K/uL Final   Basophils Relative 05/31/2023 1  % Final   Basophils Absolute 05/31/2023 0.1  0.0 - 0.1 K/uL Final   Immature Granulocytes 05/31/2023 0  % Final   Abs Immature Granulocytes 05/31/2023 0.01  0.00 - 0.07 K/uL Final   Performed at [facility], [address] [place], [place] [address]    ASSESSMENT/PLAN Tara Marquez is a 82 yo F with pmh of Afib, hypothyroidism HTN and HLD who was referred to Hematology for management of iron deficiency anemia.   #Iron deficiency anemia -likely secondary to microscopic hematuria. Colonoscopy done in Jan 2023 showed non bleeding internal hemorrhoids only. She had endoscopy with Dr.  Servando Snare on 11/02/2022 which showed small hiatal hernia and benign polyp in stomach with no evidence of bleeding.   -She was treated with 2 more doses of IV Feraheme in March 2024 for drop in hemoglobin and iron.  Hemoglobin today improved from 9.8-11.6.  Iron panel is pending.  I will hold off on Feraheme today with improvement in hemoglobin.  Denies any bleeding in stool or urine.  I advised patient to get repeat CBC and iron panel/ferritin in 6 months to monitor.  Patient prefers to have her blood work done with primary and will reach out to our clinic if her levels go down.  She is hoping to cut down on few doctor visits.  # Bilateral kidney stone - 01/13/2023-admitted with obstructive uropathy with superimposed pyelonephritis on the left side status post ureteral stent placement by Dr. Lonna Cobb.  Urine culture showed Klebsiella pneumoniae.  Treated with antibiotics.  She had cystoscopy, left ureteroscopy and stone removal.  Laser lithotripsy and stent exchange.  - 03/11/2023- s/p flexible cystoscopy with stent removal by Dr. Lonna Cobb  # A-fib -On Eliquis.  Dose was decreased to 2.5 mg twice daily by cardiology due to hematuria   No orders of the defined types were placed in this encounter.  RTC as needed.  Patient prefers to follow with PCP for hemoglobin and iron level monitoring.  She will reach out to [place] if her levels go down to schedule for iron infusions.  All questions were answered. The patient knows to  call the clinic with any problems, questions or concerns.  This note was electronically signed.    Michaelyn Barter, MD  05/31/2023 2:08 PM

## 2023-05-31 NOTE — Progress Notes (Signed)
Patient has been falling a lot more recently.

## 2023-06-24 DIAGNOSIS — D509 Iron deficiency anemia, unspecified: Secondary | ICD-10-CM | POA: Diagnosis not present

## 2023-06-24 DIAGNOSIS — N138 Other obstructive and reflux uropathy: Secondary | ICD-10-CM | POA: Diagnosis not present

## 2023-06-24 DIAGNOSIS — Z9181 History of falling: Secondary | ICD-10-CM | POA: Diagnosis not present

## 2023-06-24 DIAGNOSIS — I10 Essential (primary) hypertension: Secondary | ICD-10-CM | POA: Diagnosis not present

## 2023-06-24 DIAGNOSIS — R634 Abnormal weight loss: Secondary | ICD-10-CM | POA: Diagnosis not present

## 2023-06-24 DIAGNOSIS — I480 Paroxysmal atrial fibrillation: Secondary | ICD-10-CM | POA: Diagnosis not present

## 2023-06-24 DIAGNOSIS — R29898 Other symptoms and signs involving the musculoskeletal system: Secondary | ICD-10-CM | POA: Diagnosis not present

## 2023-06-24 DIAGNOSIS — E782 Mixed hyperlipidemia: Secondary | ICD-10-CM | POA: Diagnosis not present

## 2023-06-24 DIAGNOSIS — R2681 Unsteadiness on feet: Secondary | ICD-10-CM | POA: Diagnosis not present

## 2023-06-24 DIAGNOSIS — Z8659 Personal history of other mental and behavioral disorders: Secondary | ICD-10-CM | POA: Diagnosis not present

## 2023-07-14 ENCOUNTER — Ambulatory Visit: Payer: Medicare Other | Attending: Family Medicine

## 2023-07-14 VITALS — BP 188/63 | HR 54

## 2023-07-14 DIAGNOSIS — R2681 Unsteadiness on feet: Secondary | ICD-10-CM | POA: Diagnosis not present

## 2023-07-14 DIAGNOSIS — M6281 Muscle weakness (generalized): Secondary | ICD-10-CM | POA: Insufficient documentation

## 2023-07-14 DIAGNOSIS — R296 Repeated falls: Secondary | ICD-10-CM | POA: Insufficient documentation

## 2023-07-15 NOTE — Addendum Note (Signed)
Addended by: Rosamaria Lints on: 07/15/2023 11:41 AM   Modules accepted: Orders

## 2023-07-15 NOTE — Therapy (Signed)
[facility] at [facility] [address] [place], [place], [address] Phone: [phone]   Fax:  [phone]  Outpatient Physical Therapy Evaluation  Patient Details  Name: Tara Marquez MRN: [id number] Date of Birth: 1941/08/20 Referring Provider (PT): Julieanne Manson MD (PCP)   Encounter Date: 07/14/2023   PT End of Session - 07/15/23 1114     Visit Number 1    Number of Visits 17    Date for PT Re-Evaluation 09/08/23    Authorization Type Medicare A&B primary with BCBS supplemental secondary    Authorization Time Period 07/14/23-09/08/23    Progress Note Due on Visit 10    PT Start Time 1400    PT Stop Time 1445    PT Time Calculation (min) 45 min    Activity Tolerance Patient tolerated treatment well;No increased pain;Treatment limited secondary to medical complications (Comment)    Behavior During Therapy [facility] for tasks assessed/performed             Past Medical History:  Diagnosis Date   Acute pyelonephritis    Adaptive colitis    Anemia    Aortic atherosclerosis (HCC)    Atrial fibrillation and flutter (HCC)    a.) CHA2DS2VASc = 5 (age x 2, sex, HTN, vascular disease history);  b.) rate/rhythm maintained on oral amiodarone + metoprolol succinate; chronically anticoagulated with dose reduced apixaban   CAD (coronary artery disease)    Depression    Descending thoracic aortic aneurysm (HCC)    a.) CT chest 09/29/2022: 4.9 cm; b.) CT abd 01/13/2023: 4.9 cm   Frequent PVCs    GERD (gastroesophageal reflux disease)    Hepatic steatosis    History of 2019 novel coronavirus disease (COVID-19) 12/04/2021   Hyperlipidemia    Hypertension    Hypokalemia    Hypothyroidism    Long term current use of amiodarone    Long term current use of anticoagulant    a.) dose reduced apixaban   Nephrolithiasis    Osteopenia    Polymyalgia rheumatica (HCC)    Skin cancer    Splenomegaly    Temporal arteritis (HCC)    Vitamin D  deficiency     Past Surgical History:  Procedure Laterality Date   ABDOMINAL HYSTERECTOMY  03/1971   Status Post Hysterectomy   CATARACT EXTRACTION Left    COLONOSCOPY     2010   COLONOSCOPY WITH PROPOFOL N/A 01/12/2022   Procedure: COLONOSCOPY WITH PROPOFOL;  Surgeon: Midge Minium, MD;  Location: [facility] ENDOSCOPY;  Service: Endoscopy;  Laterality: N/A;   CYSTOSCOPY WITH RETROGRADE PYELOGRAM, URETEROSCOPY AND STENT PLACEMENT Left 01/13/2023   Procedure: CYSTOSCOPY WITH RETROGRADE PYELOGRAM, URETEROSCOPY AND STENT PLACEMENT;  Surgeon: Riki Altes, MD;  Location: [facility] ORS;  Service: Urology;  Laterality: Left;   CYSTOSCOPY/URETEROSCOPY/HOLMIUM LASER/STENT PLACEMENT Left 02/08/2023   Procedure: CYSTOSCOPY/URETEROSCOPY/HOLMIUM LASER/STENT EXCHANGE;  Surgeon: Riki Altes, MD;  Location: [facility] ORS;  Service: Urology;  Laterality: Left;   ESOPHAGOGASTRODUODENOSCOPY N/A 11/02/2022   Procedure: ESOPHAGOGASTRODUODENOSCOPY (EGD);  Surgeon: Midge Minium, MD;  Location: [facility] ENDOSCOPY;  Service: Endoscopy;  Laterality: N/A;   EYE SURGERY     GIVENS CAPSULE STUDY N/A 12/22/2022   Procedure: GIVENS CAPSULE STUDY;  Surgeon: Midge Minium, MD;  Location: [facility] ENDOSCOPY;  Service: Endoscopy;  Laterality: N/A;   TONSILLECTOMY     TONSILLECTOMY AND ADENOIDECTOMY  1951   WISDOM TOOTH EXTRACTION  1970    Vitals:   07/14/23 1102  BP: (!) 188/63  Pulse: (!) 54  188/63 mm/Hg 54 bpm- sitting x 5 minutes 179/52 mm/Hg 53 bpm-sitting (repeat assessment) 155/46mmHg 56 bpm - standing at 0 minutes, no hypotension symptoms 170/48mmHg 55 bpm - standing at 1 minutes, no hypotension symptoms    Subjective Assessment     Subjective Pt feels weaker since hpsitalization 5 months back, reports 5 falls since then.    Pertinent History Tara Marquez is an 82yoF who is referred by PCP due to gradual progressive weakness since hospital admission in February 2024. Pt presents to PT eval with husband. Pt reports being  in hospital for ~ a week in Feb 2024 with urinary obstruction, AKI, felt week initially but declined HHPT; pt/husband say progressively more weak over subsequent months, more difficulty with walking, husband has now asked her to use a cane. Pt reports 5 falls, 4 of which were outside on natural surface 5th trying to clear a threshold (posterior fall buttock, back, hit head). Does get dizzy, but not frequently, not related to falls often.    How long can you sit comfortably? Not a limitation    How long can you stand comfortably? Not a limitation    How long can you walk comfortably? Mostly walks household distances, but can tolerate occasional store trips with buggy support.    Currently in Pain? No/denies                [facility] PT Assessment -       Assessment   Medical Diagnosis Generalized weakness, repeated falls    Referring Provider (PT) Julieanne Manson MD (PCP)    Onset Date/Surgical Date 02/10/23    Prior Therapy declined HHPT at [place] in Feb      Precautions   Precautions Fall      Balance Screen   Has the patient fallen in the past 6 months Yes    How many times? 5    Has the patient had a decrease in activity level because of a fear of falling?  Yes    Is the patient reluctant to leave their home because of a fear of falling?  Yes      Home Environment   Living Environment Private residence    Living Arrangements Spouse/significant other;Children    Available Help at Discharge Family    Type of Home House    Home Access Stairs to enter    Entrance Stairs-Number of Steps 2 or 4   both have unilateral support options   Home Layout Able to live on main level with bedroom/bathroom;Full bath on main level;Multi-level    Alternate Level Stairs-Number of Steps 13    Home Equipment Cane - single point      Prior Function   Level of Independence Independent    Vocation Retired      IT consultant   Overall Cognitive Status Within Functional Limits for tasks assessed   pt  reports to feel some mil decline since hospitalization, but nothing significant     Observation/Other Assessments   Focus on Therapeutic Outcomes (FOTO)  pending visit 2      Transfers   Five time sit to stand comments  deferred due to elevated BP      Ambulation/Gait   Ambulation/Gait Yes    Ambulation Distance (Feet) 300 Feet    Assistive device Straight cane    Gait Pattern Poor foot clearance - right   worse over time with intermittent light scufing over final 193ft   Ambulation Surface Level;Indoor    Gait velocity 0.17m/s  Gait Comments Pt reports to feel unsteady most of time, intermitent SPC use in AMB, but does not espeically improve sense of stability             Strength Screening: -MMT seated hip flexion: Left 5/5, Right 4/5  -MMT seated ankle DF: Left 5/5, Right 5-/5 (appears/feel weaker, but maintains position) Seated ankle DF 1x15x3secH (loss of excursion in ROM on Rt side by #15; left unchanged)   Objective measurements completed on examination: See above findings.   *education on monitoring BP twice daily and logging at home- when to call PCP, when to call EMS. Pt repeats information to Chartered loss adjuster for teach back.     PT Education -     Education Details Explained how her gait speed indicated risk of falling, being hospitalized, likelihood of need assitance with ADL.    Person(s) Educated Patient;Spouse    Methods Explanation;Demonstration;Handout    Comprehension Need further instruction;Verbalized understanding              PT Short Term Goals -        PT SHORT TERM GOAL #1   Title compliant with balance HEP    Time 4    Period Weeks    Status New    Target Date 08/12/23      PT SHORT TERM GOAL #2   Title Improved overground gait speed over 581ft to improve ability to access limited community distances safely.    Baseline eval: 361ft at 0.13m/s    Time 4    Period Weeks    Status New    Target Date 08/12/23               PT Long  Term Goals -       PT LONG TERM GOAL #1   Title FOTO score improvement >12 points to indicate improve confidence in balance.    Baseline pending 2nd visit    Time 8    Period Weeks    Status New    Target Date 09/09/23      PT LONG TERM GOAL #2   Title Improvement in standardized balance assessment by MCID or greater.    Baseline pending visit 2    Time 8    Period Weeks    Status New    Target Date 09/09/23      PT LONG TERM GOAL #3   Title Pt to demonstrate 5xSTS hands free from standard height surface to indicated improved BLE power.    Baseline deferred to visit 2    Time 8    Period Weeks    Status New    Target Date 09/09/23      PT LONG TERM GOAL #4   Title Pt to demonstrate performance of 2060ft AMB >0.66m/s without increased pain and without LOB at modI level or less to restore ability to access comunity distances for IADL.    Time 8    Period Weeks    Status New    Target Date 09/09/23                 Plan -     Clinical Impression Statement Examination revealing of impairment of several systems that indicated elevated falls risk empirically. Pt has had 5 recent falls. Pt feels AMB has been mor edifficulty and unsafe in pat several months which a big change from her baseline. Pt very much would like to return to her baseline mobility and baseline level of balance, as  well as to stopping falling- a skilled physical therapy intervention is well poised to address these deficits.    Personal Factors and Comorbidities Age;Fitness;Behavior Pattern;Past/Current Experience    Examination-Activity Limitations Locomotion Level;Transfers;Lift;Stairs    Examination-Participation Restrictions Meal Prep;Cleaning;Community Activity;Driving;Interpersonal Relationship;Yard Work    Stability/Clinical Decision Making Stable/Uncomplicated    Clinical Decision Making Moderate    Rehab Potential Good    PT Frequency 2x / week    PT Duration 8 weeks    PT  Treatment/Interventions ADLs/Self Care Home Management;Gait training;Stair training;Functional mobility training;Neuromuscular re-education;Balance training;Therapeutic exercise;Therapeutic activities;Passive range of motion;Manual techniques    PT Next Visit Plan Check vitals, FOTO survey, balance assessment, 5xSTS, establish salient balance home exercises.    PT Home Exercise Plan asked at eval to check and log BP twice daily, educated on when to call PCP v EMS    Consulted and Agree with Plan of Care Patient;Family member/caregiver             Patient will benefit from skilled therapeutic intervention in order to improve the following deficits and impairments:  Abnormal gait, Decreased range of motion, Difficulty walking, Decreased safety awareness, Decreased activity tolerance, Decreased balance, Decreased mobility, Decreased strength  Visit Diagnosis: Unsteadiness on feet  Muscle weakness (generalized)  Repeated falls     Problem List Patient Active Problem List   Diagnosis Date Noted   Obstructive nephropathy 01/13/2023   Acute pyelonephritis 01/13/2023   AKI (acute kidney injury) (HCC) 01/13/2023   Hypokalemia 01/13/2023   Diarrhea 01/13/2023   Thoracic aortic aneurysm without rupture (HCC) 11/09/2022   Hematuria 11/05/2022   Polyp of esophagus 11/02/2022   Iron deficiency anemia 07/28/2022   Positive colorectal cancer screening using Cologuard test    Abnormal ECG 03/31/2021   Acute non-recurrent sinusitis 11/30/2020   Acute otitis media 11/30/2020   Benign essential HTN 10/31/2018   Frequent PVCs 09/08/2017   A-fib (HCC) 08/27/2015   Clinical depression 08/27/2015   Blood pressure elevated 08/27/2015   Polypharmacy 08/27/2015   Esophagitis, reflux 08/27/2015   Fatigue 08/27/2015   Acid reflux 08/27/2015   HLD (hyperlipidemia) 08/27/2015   Adult hypothyroidism 08/27/2015   Adaptive colitis 08/27/2015   Malaise and fatigue 08/27/2015   Mild major depression  (HCC) 08/27/2015   Polymyalgia rheumatica (HCC) 08/27/2015   Cranial arteritis (HCC) 08/27/2015   Avitaminosis D 08/27/2015    11:38 AM, 07/15/23 Rosamaria Lints, PT, DPT Physical Therapist - [facility]  Outpatient Physical Therapy- Main Campus [phone]     Buna, PT 07/15/2023, 11:30 AM   [facility] Outpatient Rehabilitation at [facility] [address] [place], [place], [address] Phone: [phone]   Fax:  [phone]  Name: ALAHNA DUNNE MRN: [id number] Date of Birth: 08-17-1941

## 2023-07-19 ENCOUNTER — Ambulatory Visit: Payer: Medicare Other

## 2023-07-19 DIAGNOSIS — R2681 Unsteadiness on feet: Secondary | ICD-10-CM | POA: Diagnosis not present

## 2023-07-19 DIAGNOSIS — M6281 Muscle weakness (generalized): Secondary | ICD-10-CM

## 2023-07-19 DIAGNOSIS — R296 Repeated falls: Secondary | ICD-10-CM

## 2023-07-19 NOTE — Therapy (Signed)
[facility] Outpatient Rehabilitation at [facility] [address] [place], [place], [address] Phone: [phone]   Fax:  [phone]  Outpatient Physical Therapy Treatment  Patient Details  Name: Tara Marquez MRN: [id number] Date of Birth: 01-Jan-1941 Referring Provider (PT): Julieanne Manson MD (PCP)   Encounter Date: 07/19/2023   PT End of Session - 07/19/23 1024     Visit Number 2    Number of Visits 17    Date for PT Re-Evaluation 09/08/23    Authorization Type Medicare A&B primary with BCBS supplemental secondary    Authorization Time Period 07/14/23-09/08/23    Progress Note Due on Visit 10    PT Start Time 1020    PT Stop Time 1059    PT Time Calculation (min) 39 min    Equipment Utilized During Treatment Gait belt    Activity Tolerance Patient tolerated treatment well;No increased pain;Treatment limited secondary to medical complications (Comment)    Behavior During [facility] for tasks assessed/performed             Past Medical History:  Diagnosis Date   Acute pyelonephritis    Adaptive colitis    Anemia    Aortic atherosclerosis (HCC)    Atrial fibrillation and flutter (HCC)    a.) CHA2DS2VASc = 5 (age x 2, sex, HTN, vascular disease history);  b.) rate/rhythm maintained on oral amiodarone + metoprolol succinate; chronically anticoagulated with dose reduced apixaban   CAD (coronary artery disease)    Depression    Descending thoracic aortic aneurysm (HCC)    a.) CT chest 09/29/2022: 4.9 cm; b.) CT abd 01/13/2023: 4.9 cm   Frequent PVCs    GERD (gastroesophageal reflux disease)    Hepatic steatosis    History of 2019 novel coronavirus disease (COVID-19) 12/04/2021   Hyperlipidemia    Hypertension    Hypokalemia    Hypothyroidism    Long term current use of amiodarone    Long term current use of anticoagulant    a.) dose reduced apixaban   Nephrolithiasis    Osteopenia    Polymyalgia rheumatica (HCC)    Skin cancer     Splenomegaly    Temporal arteritis (HCC)    Vitamin D deficiency     Past Surgical History:  Procedure Laterality Date   ABDOMINAL HYSTERECTOMY  03/1971   Status Post Hysterectomy   CATARACT EXTRACTION Left    COLONOSCOPY     2010   COLONOSCOPY WITH PROPOFOL N/A 01/12/2022   Procedure: COLONOSCOPY WITH PROPOFOL;  Surgeon: Midge Minium, MD;  Location: [facility] ENDOSCOPY;  Service: Endoscopy;  Laterality: N/A;   CYSTOSCOPY WITH RETROGRADE PYELOGRAM, URETEROSCOPY AND STENT PLACEMENT Left 01/13/2023   Procedure: CYSTOSCOPY WITH RETROGRADE PYELOGRAM, URETEROSCOPY AND STENT PLACEMENT;  Surgeon: Riki Altes, MD;  Location: [facility] ORS;  Service: Urology;  Laterality: Left;   CYSTOSCOPY/URETEROSCOPY/HOLMIUM LASER/STENT PLACEMENT Left 02/08/2023   Procedure: CYSTOSCOPY/URETEROSCOPY/HOLMIUM LASER/STENT EXCHANGE;  Surgeon: Riki Altes, MD;  Location: [facility] ORS;  Service: Urology;  Laterality: Left;   ESOPHAGOGASTRODUODENOSCOPY N/A 11/02/2022   Procedure: ESOPHAGOGASTRODUODENOSCOPY (EGD);  Surgeon: Midge Minium, MD;  Location: [facility] ENDOSCOPY;  Service: Endoscopy;  Laterality: N/A;   EYE SURGERY     GIVENS CAPSULE STUDY N/A 12/22/2022   Procedure: GIVENS CAPSULE STUDY;  Surgeon: Midge Minium, MD;  Location: [facility] ENDOSCOPY;  Service: Endoscopy;  Laterality: N/A;   TONSILLECTOMY     TONSILLECTOMY AND ADENOIDECTOMY  1951   WISDOM TOOTH EXTRACTION  1970    There were no vitals  filed for this visit.   188/63 mm/Hg 54 bpm- sitting x 5 minutes 179/52 mm/Hg 53 bpm-sitting (repeat assessment) 155/27mmHg 56 bpm - standing at 0 minutes, no hypotension symptoms 170/3mmHg 55 bpm - standing at 1 minutes, no hypotension symptoms    Subjective Assessment     Subjective Patient reports doing okay- denies any pain or falls since initial visit.     Pertinent History Tara Marquez is an 82yoF who is referred by PCP due to gradual progressive weakness since hospital admission in February 2024. Pt presents to PT  eval with husband. Pt reports being in hospital for ~ a week in Feb 2024 with urinary obstruction, AKI, felt week initially but declined HHPT; pt/husband say progressively more weak over subsequent months, more difficulty with walking, husband has now asked her to use a cane. Pt reports 5 falls, 4 of which were outside on natural surface 5th trying to clear a threshold (posterior fall buttock, back, hit head). Does get dizzy, but not frequently, not related to falls often.    How long can you sit comfortably? Not a limitation    How long can you stand comfortably? Not a limitation    How long can you walk comfortably? Mostly walks household distances, but can tolerate occasional store trips with buggy support.    Currently in Pain? No/denies                [facility] PT Assessment -       Assessment   Medical Diagnosis Generalized weakness, repeated falls    Referring Provider (PT) Julieanne Manson MD (PCP)    Onset Date/Surgical Date 02/10/23    Prior Therapy declined HHPT at [place] in Feb      Precautions   Precautions Fall      Balance Screen   Has the patient fallen in the past 6 months Yes    How many times? 5    Has the patient had a decrease in activity level because of a fear of falling?  Yes    Is the patient reluctant to leave their home because of a fear of falling?  Yes      Home Environment   Living Environment Private residence    Living Arrangements Spouse/significant other;Children    Available Help at Discharge Family    Type of Home House    Home Access Stairs to enter    Entrance Stairs-Number of Steps 2 or 4   both have unilateral support options   Home Layout Able to live on main level with bedroom/bathroom;Full bath on main level;Multi-level    Alternate Level Stairs-Number of Steps 13    Home Equipment Cane - single point      Prior Function   Level of Independence Independent    Vocation Retired      IT consultant   Overall Cognitive Status Within Functional  Limits for tasks assessed   pt reports to feel some mil decline since hospitalization, but nothing significant     Observation/Other Assessments   Focus on Therapeutic Outcomes (FOTO)  pending visit 2      Transfers   Five time sit to stand comments  deferred due to elevated BP      Ambulation/Gait   Ambulation/Gait Yes    Ambulation Distance (Feet) 300 Feet    Assistive device Straight cane    Gait Pattern Poor foot clearance - right   worse over time with intermittent light scufing over final 174ft   Ambulation Surface Level;Indoor  Gait velocity 0.80m/s    Gait Comments Pt reports to feel unsteady most of time, intermitent SPC use in AMB, but does not espeically improve sense of stability             Strength Screening: -MMT seated hip flexion: Left 5/5, Right 4/5  -MMT seated ankle DF: Left 5/5, Right 5-/5 (appears/feel weaker, but maintains position) Seated ankle DF 1x15x3secH (loss of excursion in ROM on Rt side by #15; left unchanged)   Objective measurements completed on examination: See above findings.   *education on monitoring BP twice daily and logging at home- when to call PCP, when to call EMS. Pt repeats information to Chartered loss adjuster for teach back.     TODAY'S TREATMENT:   07/19/23   BP upon entering clinic: 166/62 mmHg  5x STS test= 31.7 sec without UE support  BERG= 35/56     PT Education -     Education Details Explained how her gait speed indicated risk of falling, being hospitalized, likelihood of need assitance with ADL.    Person(s) Educated Patient;Spouse    Methods Explanation;Demonstration;Handout    Comprehension Need further instruction;Verbalized understanding              PT Short Term Goals -        PT SHORT TERM GOAL #1   Title compliant with balance HEP    Time 4    Period Weeks    Status New    Target Date 08/12/23      PT SHORT TERM GOAL #2   Title Improved overground gait speed over 559ft to improve ability to access  limited community distances safely.    Baseline eval: 324ft at 0.39m/s    Time 4    Period Weeks    Status New    Target Date 08/12/23               PT Long Term Goals -       PT LONG TERM GOAL #1   Title FOTO score improvement >12 points to indicate improve confidence in balance.    Baseline 07/19/2023=43   Time 8    Period Weeks    Status New    Target Date 09/09/23      PT LONG TERM GOAL #2   Title Improvement in standardized balance assessment by MCID or greater.    Baseline 07/19/2023= BERG- 35/56   Time 8    Period Weeks    Status New    Target Date 09/09/23      PT LONG TERM GOAL #3   Title Pt to demonstrate 5xSTS hands free from standard height surface to indicated improved BLE power.    Baseline 07/19/2023= 31.07 sec without UE Support   Time 8    Period Weeks    Status New    Target Date 09/09/23      PT LONG TERM GOAL #4   Title Pt to demonstrate performance of 2080ft AMB >0.60m/s without increased pain and without LOB at modI level or less to restore ability to access comunity distances for IADL.    Time 8    Period Weeks    Status New    Target Date 09/09/23                 Plan -     Clinical Impression Statement Treatment consisted of completing some evaluation  outcome/testing. Patient required increased time to complete BERG and FOTO today and presents with impaired balance and  will benefit from skilled PT services to improve her balance based on recent fall history record and decreased BERG Score. Patient will benefit from skilled PT services to address and improve her functional strength and mobility to assist in return to previous level of function.    Personal Factors and Comorbidities Age;Fitness;Behavior Pattern;Past/Current Experience    Examination-Activity Limitations Locomotion Level;Transfers;Lift;Stairs    Examination-Participation Restrictions Meal Prep;Cleaning;Community Activity;Driving;Interpersonal Relationship;Yard Work     Stability/Clinical Decision Making Stable/Uncomplicated    Clinical Decision Making Moderate    Rehab Potential Good    PT Frequency 2x / week    PT Duration 8 weeks    PT Treatment/Interventions ADLs/Self Care Home Management;Gait training;Stair training;Functional mobility training;Neuromuscular re-education;Balance training;Therapeutic exercise;Therapeutic activities;Passive range of motion;Manual techniques    PT Next Visit Plan Check vitals,  establish salient balance home exercises, Begin therex for LE strengthening.    PT Home Exercise Plan asked at eval to check and log BP twice daily, educated on when to call PCP v EMS    Consulted and Agree with Plan of Care Patient;Family member/caregiver             Patient will benefit from skilled therapeutic intervention in order to improve the following deficits and impairments:     Visit Diagnosis: Unsteadiness on feet  Muscle weakness (generalized)  Repeated falls     Problem List Patient Active Problem List   Diagnosis Date Noted   Obstructive nephropathy 01/13/2023   Acute pyelonephritis 01/13/2023   AKI (acute kidney injury) (HCC) 01/13/2023   Hypokalemia 01/13/2023   Diarrhea 01/13/2023   Thoracic aortic aneurysm without rupture (HCC) 11/09/2022   Hematuria 11/05/2022   Polyp of esophagus 11/02/2022   Iron deficiency anemia 07/28/2022   Positive colorectal cancer screening using Cologuard test    Abnormal ECG 03/31/2021   Acute non-recurrent sinusitis 11/30/2020   Acute otitis media 11/30/2020   Benign essential HTN 10/31/2018   Frequent PVCs 09/08/2017   A-fib (HCC) 08/27/2015   Clinical depression 08/27/2015   Blood pressure elevated 08/27/2015   Polypharmacy 08/27/2015   Esophagitis, reflux 08/27/2015   Fatigue 08/27/2015   Acid reflux 08/27/2015   HLD (hyperlipidemia) 08/27/2015   Adult hypothyroidism 08/27/2015   Adaptive colitis 08/27/2015   Malaise and fatigue 08/27/2015   Mild major depression  (HCC) 08/27/2015   Polymyalgia rheumatica (HCC) 08/27/2015   Cranial arteritis (HCC) 08/27/2015   Avitaminosis D 08/27/2015    3:30 PM, 07/19/23   Lenda Kelp, PT 07/19/2023, 3:30 PM  [facility] Outpatient Rehabilitation at [facility] [address] [place], [place], [address] Phone: [phone]   Fax:  [phone]  Name: Tara Marquez MRN: [id number] Date of Birth: [birth year]

## 2023-07-21 ENCOUNTER — Ambulatory Visit: Payer: Medicare Other | Admitting: Physical Therapy

## 2023-07-21 DIAGNOSIS — M6281 Muscle weakness (generalized): Secondary | ICD-10-CM

## 2023-07-21 DIAGNOSIS — R296 Repeated falls: Secondary | ICD-10-CM

## 2023-07-21 DIAGNOSIS — R2681 Unsteadiness on feet: Secondary | ICD-10-CM

## 2023-07-21 NOTE — Therapy (Signed)
[facility] [address] [place], [place], [address] Phone: [phone]   Fax:  [phone]  Outpatient Physical Therapy Treatment  Patient Details  Name: Tara Marquez MRN: [id number] Date of Birth: 09/04/1941 Referring Provider (PT): Julieanne Manson MD (PCP)   Encounter Date: 07/21/2023   PT End of Session - 07/21/23 1149     Visit Number 3    Number of Visits 17    Date for PT Re-Evaluation 09/08/23    Authorization Type Medicare A&B primary with BCBS supplemental secondary    Authorization Time Period 07/14/23-09/08/23    Progress Note Due on Visit 10    PT Start Time 1150    PT Stop Time 1233    PT Time Calculation (min) 43 min    Equipment Utilized During Treatment Gait belt    Activity Tolerance Patient tolerated treatment well;No increased pain;Treatment limited secondary to medical complications (Comment)    Behavior During Therapy [facility] for tasks assessed/performed             Past Medical History:  Diagnosis Date   Acute pyelonephritis    Adaptive colitis    Anemia    Aortic atherosclerosis (HCC)    Atrial fibrillation and flutter (HCC)    a.) CHA2DS2VASc = 5 (age x 2, sex, HTN, vascular disease history);  b.) rate/rhythm maintained on oral amiodarone + metoprolol succinate; chronically anticoagulated with dose reduced apixaban   CAD (coronary artery disease)    Depression    Descending thoracic aortic aneurysm (HCC)    a.) CT chest 09/29/2022: 4.9 cm; b.) CT abd 01/13/2023: 4.9 cm   Frequent PVCs    GERD (gastroesophageal reflux disease)    Hepatic steatosis    History of 2019 novel coronavirus disease (COVID-19) 12/04/2021   Hyperlipidemia    Hypertension    Hypokalemia    Hypothyroidism    Long term current use of amiodarone    Long term current use of anticoagulant    a.) dose reduced apixaban   Nephrolithiasis    Osteopenia    Polymyalgia rheumatica (HCC)    Skin cancer     Splenomegaly    Temporal arteritis (HCC)    Vitamin D deficiency     Past Surgical History:  Procedure Laterality Date   ABDOMINAL HYSTERECTOMY  03/1971   Status Post Hysterectomy   CATARACT EXTRACTION Left    COLONOSCOPY     2010   COLONOSCOPY WITH PROPOFOL N/A 01/12/2022   Procedure: COLONOSCOPY WITH PROPOFOL;  Surgeon: Midge Minium, MD;  Location: [facility] ENDOSCOPY;  Service: Endoscopy;  Laterality: N/A;   CYSTOSCOPY WITH RETROGRADE PYELOGRAM, URETEROSCOPY AND STENT PLACEMENT Left 01/13/2023   Procedure: CYSTOSCOPY WITH RETROGRADE PYELOGRAM, URETEROSCOPY AND STENT PLACEMENT;  Surgeon: Riki Altes, MD;  Location: [facility] ORS;  Service: Urology;  Laterality: Left;   CYSTOSCOPY/URETEROSCOPY/HOLMIUM LASER/STENT PLACEMENT Left 02/08/2023   Procedure: CYSTOSCOPY/URETEROSCOPY/HOLMIUM LASER/STENT EXCHANGE;  Surgeon: Riki Altes, MD;  Location: [facility] ORS;  Service: Urology;  Laterality: Left;   ESOPHAGOGASTRODUODENOSCOPY N/A 11/02/2022   Procedure: ESOPHAGOGASTRODUODENOSCOPY (EGD);  Surgeon: Midge Minium, MD;  Location: [facility] ENDOSCOPY;  Service: Endoscopy;  Laterality: N/A;   EYE SURGERY     GIVENS CAPSULE STUDY N/A 12/22/2022   Procedure: GIVENS CAPSULE STUDY;  Surgeon: Midge Minium, MD;  Location: [facility] ENDOSCOPY;  Service: Endoscopy;  Laterality: N/A;   TONSILLECTOMY     TONSILLECTOMY AND ADENOIDECTOMY  1951   WISDOM TOOTH EXTRACTION  1970    There were no vitals  filed for this visit.   188/63 mm/Hg 54 bpm- sitting x 5 minutes 179/52 mm/Hg 53 bpm-sitting (repeat assessment) 155/36mmHg 56 bpm - standing at 0 minutes, no hypotension symptoms 170/44mmHg 55 bpm - standing at 1 minutes, no hypotension symptoms    Subjective Assessment     Subjective Patient reports doing well. No falls no stumbles since last session. Reports that she has been checking BP regularly. Noted to have resting BP in 140's systolic.      Pertinent History Tara Marquez is an 82yoF who is referred by PCP due to  gradual progressive weakness since hospital admission in February 2024. Pt presents to PT eval with husband. Pt reports being in hospital for ~ a week in Feb 2024 with urinary obstruction, AKI, felt week initially but declined HHPT; pt/husband say progressively more weak over subsequent months, more difficulty with walking, husband has now asked her to use a cane. Pt reports 5 falls, 4 of which were outside on natural surface 5th trying to clear a threshold (posterior fall buttock, back, hit head). Does get dizzy, but not frequently, not related to falls often.    How long can you sit comfortably? Not a limitation    How long can you stand comfortably? Not a limitation    How long can you walk comfortably? Mostly walks household distances, but can tolerate occasional store trips with buggy support.    Currently in Pain? No/denies             Taken at [facility] unless otherwise noted.    [facility] PT Assessment -       Assessment   Medical Diagnosis Generalized weakness, repeated falls    Referring Provider (PT) Julieanne Manson MD (PCP)    Onset Date/Surgical Date 02/10/23    Prior Therapy declined HHPT at [facility] in Feb      Precautions   Precautions Fall      Balance Screen   Has the patient fallen in the past 6 months Yes    How many times? 5    Has the patient had a decrease in activity level because of a fear of falling?  Yes    Is the patient reluctant to leave their home because of a fear of falling?  Yes      Home Environment   Living Environment Private residence    Living Arrangements Spouse/significant other;Children    Available Help at Discharge Family    Type of Home House    Home Access Stairs to enter    Entrance Stairs-Number of Steps 2 or 4   both have unilateral support options   Home Layout Able to live on main level with bedroom/bathroom;Full bath on main level;Multi-level    Alternate Level Stairs-Number of Steps 13    Home Equipment Cane - single point      Prior  Function   Level of Independence Independent    Vocation Retired      IT consultant   Overall Cognitive Status Within Functional Limits for tasks assessed   pt reports to feel some mil decline since hospitalization, but nothing significant     Observation/Other Assessments   Focus on Therapeutic Outcomes (FOTO)  pending visit 2      Transfers   Five time sit to stand comments  deferred due to elevated BP      Ambulation/Gait   Ambulation/Gait Yes    Ambulation Distance (Feet) 300 Feet    Assistive device Straight cane    Gait Pattern  Poor foot clearance - right   worse over time with intermittent light scufing over final 154ft   Ambulation Surface Level;Indoor    Gait velocity 0.77m/s    Gait Comments Pt reports to feel unsteady most of time, intermitent SPC use in AMB, but does not espeically improve sense of stability             Strength Screening: -MMT seated hip flexion: Left 5/5, Right 4/5  -MMT seated ankle DF: Left 5/5, Right 5-/5 (appears/feel weaker, but maintains position) Seated ankle DF 1x15x3secH (loss of excursion in ROM on Rt side by #15; left unchanged)   Objective measurements completed on examination: See above findings.    TODAY'S TREATMENT:     Sit<>stand 2x7 with CGA from PT increased effort on 6th and 7th repetition on each bout.  BP assessment:  Seated  following sit<>stand x 2 bouts. : 177/70 HR 52  2  min rest break: 173/60 HR 49 4 min rest break: 164/64 HR 49   Standing on airex pad:  Eyes open 30 sec/eyes closed 10 sec x 3 bouts  Standing on level surface:  BUE support on rails tandem stance 20 sec x 2 bil.     Hip abduction sitting in chair RTB x 12 Hip flexion RTB x 10  Standing hip abduction x 10 bil.  BP reassessed after 3 min rest break: 165/67 HR 48.   Throughout session, cues from PT for decreased speed of movement to improve muscle recruitment.  Pt required multiple seated rest breaks due to mild fatigue and to allow BP to  reduce to closer to [facility].    PT Education -     Education Details Pt educated throughout session about proper posture and technique with exercises. Improved exercise technique, movement at target joints, use of target muscles after min to mod verbal, visual, tactile cues.    Person(s) Educated Patient;Spouse    Methods Explanation;Demonstration;Handout    Comprehension Need further instruction;Verbalized understanding              PT Short Term Goals -        PT SHORT TERM GOAL #1   Title compliant with balance HEP    Time 4    Period Weeks    Status New    Target Date 08/12/23      PT SHORT TERM GOAL #2   Title Improved overground gait speed over 536ft to improve ability to access limited community distances safely.    Baseline eval: 385ft at 0.73m/s    Time 4    Period Weeks    Status New    Target Date 08/12/23               PT Long Term Goals -       PT LONG TERM GOAL #1   Title FOTO score improvement >12 points to indicate improve confidence in balance.    Baseline 07/19/2023=43   Time 8    Period Weeks    Status New    Target Date 09/09/23      PT LONG TERM GOAL #2   Title Improvement in standardized balance assessment by MCID or greater.    Baseline 07/19/2023= BERG- 35/56   Time 8    Period Weeks    Status New    Target Date 09/09/23      PT LONG TERM GOAL #3   Title Pt to demonstrate 5xSTS hands free from standard height surface to indicated improved BLE  power.    Baseline 07/19/2023= 31.07 sec without UE Support   Time 8    Period Weeks    Status New    Target Date 09/09/23      PT LONG TERM GOAL #4   Title Pt to demonstrate performance of 2037ft AMB >0.2m/s without increased pain and without LOB at modI level or less to restore ability to access comunity distances for IADL.    Time 8    Period Weeks    Status New    Target Date 09/09/23                 Plan -     Clinical Impression Statement Treatment consisted of BLE  strengthening with HEP provided and initiation of balance training. Pt tolerated treatment well, but noted to have increased BP with exertion up to 177/70 HR 52. Is is asymptomatic with elevated BP. Appointment with cardioligst tomorrow (7/26). Husband reports that he will relay active BP to MD. Patient will benefit from skilled PT services to address and improve her functional strength and mobility to assist in return to previous level of function.    Personal Factors and Comorbidities Age;Fitness;Behavior Pattern;Past/Current Experience    Examination-Activity Limitations Locomotion Level;Transfers;Lift;Stairs    Examination-Participation Restrictions Meal Prep;Cleaning;Community Activity;Driving;Interpersonal Relationship;Yard Work    Stability/Clinical Decision Making Stable/Uncomplicated    Clinical Decision Making Moderate    Rehab Potential Good    PT Frequency 2x / week    PT Duration 8 weeks    PT Treatment/Interventions ADLs/Self Care Home Management;Gait training;Stair training;Functional mobility training;Neuromuscular re-education;Balance training;Therapeutic exercise;Therapeutic activities;Passive range of motion;Manual techniques    PT Next Visit Plan Check vitals,  establish salient balance home exercises, Begin therex for LE strengthening.    PT Home Exercise Plan asked at eval to check and log BP twice daily,   Access Code: [email or web address] URL: [email or web address] Date: 07/21/2023 Prepared by: Grier Rocher  Exercises - Sit to Stand with Armchair  - 1 x daily - 5 x weekly - 2 sets - 6 reps - Standing Hip Abduction with Counter Support  - 1 x daily - 7 x weekly - 3 sets - 10 reps - Seated Hip Abduction with Resistance  - 1 x daily - 7 x weekly - 3 sets - 10 reps - Seated March with Resistance  - 1 x daily - 7 x weekly - 3 sets - 10 reps   Consulted and Agree with Plan of Care Patient;Family member/caregiver             Patient will benefit from skilled  therapeutic intervention in order to improve the following deficits and impairments:   balance impairments, endurance deficits, reduced strength,   Visit Diagnosis: Unsteadiness on feet  Muscle weakness (generalized)  Repeated falls     Problem List Patient Active Problem List   Diagnosis Date Noted   Obstructive nephropathy 01/13/2023   Acute pyelonephritis 01/13/2023   AKI (acute kidney injury) (HCC) 01/13/2023   Hypokalemia 01/13/2023   Diarrhea 01/13/2023   Thoracic aortic aneurysm without rupture (HCC) 11/09/2022   Hematuria 11/05/2022   Polyp of esophagus 11/02/2022   Iron deficiency anemia 07/28/2022   Positive colorectal cancer screening using Cologuard test    Abnormal ECG 03/31/2021   Acute non-recurrent sinusitis 11/30/2020   Acute otitis media 11/30/2020   Benign essential HTN 10/31/2018   Frequent PVCs 09/08/2017   A-fib (HCC) 08/27/2015   Clinical depression 08/27/2015   Blood pressure elevated 08/27/2015   Polypharmacy 08/27/2015  Esophagitis, reflux 08/27/2015   Fatigue 08/27/2015   Acid reflux 08/27/2015   HLD (hyperlipidemia) 08/27/2015   Adult hypothyroidism 08/27/2015   Adaptive colitis 08/27/2015   Malaise and fatigue 08/27/2015   Mild major depression (HCC) 08/27/2015   Polymyalgia rheumatica (HCC) 08/27/2015   Cranial arteritis (HCC) 08/27/2015   Avitaminosis D 08/27/2015    1:29 PM, 07/21/23   Golden Pop, PT 07/21/2023, 1:29 PM  [place] [facility] at [facility] [address] [place], [place], [address] Phone: [phone]   Fax:  [phone]  Name: Tara Marquez MRN: [id number] Date of Birth: 1941/07/07

## 2023-07-22 DIAGNOSIS — E782 Mixed hyperlipidemia: Secondary | ICD-10-CM | POA: Diagnosis not present

## 2023-07-22 DIAGNOSIS — R42 Dizziness and giddiness: Secondary | ICD-10-CM | POA: Diagnosis not present

## 2023-07-22 DIAGNOSIS — I7123 Aneurysm of the descending thoracic aorta, without rupture: Secondary | ICD-10-CM | POA: Diagnosis not present

## 2023-07-22 DIAGNOSIS — R001 Bradycardia, unspecified: Secondary | ICD-10-CM | POA: Diagnosis not present

## 2023-07-22 DIAGNOSIS — I480 Paroxysmal atrial fibrillation: Secondary | ICD-10-CM | POA: Diagnosis not present

## 2023-07-22 DIAGNOSIS — I10 Essential (primary) hypertension: Secondary | ICD-10-CM | POA: Diagnosis not present

## 2023-07-25 ENCOUNTER — Ambulatory Visit: Payer: Medicare Other | Admitting: Physical Therapy

## 2023-07-25 DIAGNOSIS — R2681 Unsteadiness on feet: Secondary | ICD-10-CM | POA: Diagnosis not present

## 2023-07-25 DIAGNOSIS — R296 Repeated falls: Secondary | ICD-10-CM | POA: Diagnosis not present

## 2023-07-25 DIAGNOSIS — M6281 Muscle weakness (generalized): Secondary | ICD-10-CM | POA: Diagnosis not present

## 2023-07-25 NOTE — Therapy (Signed)
[facility] Outpatient Rehabilitation at [facility] [address] [place], [place], [address] Phone: [phone]   Fax:  [phone]  Outpatient Physical Therapy Treatment  Patient Details  Name: Tara Marquez MRN: [id number] Date of Birth: [birth year] Referring Provider (PT): Julieanne Manson MD (PCP)   Encounter Date: 07/25/2023   PT End of Session - 07/25/23 1452     Visit Number 4    Number of Visits 17    Date for PT Re-Evaluation 09/08/23    Authorization Type Medicare A&B primary with BCBS supplemental secondary    Authorization Time Period 07/14/23-09/08/23    Progress Note Due on Visit 10    PT Start Time 1450    PT Stop Time 1530    PT Time Calculation (min) 40 min    Equipment Utilized During Treatment Gait belt    Activity Tolerance Patient tolerated treatment well;No increased pain;Treatment limited secondary to medical complications (Comment)    Behavior During Therapy [facility] for tasks assessed/performed              Past Medical History:  Diagnosis Date   Acute pyelonephritis    Adaptive colitis    Anemia    Aortic atherosclerosis (HCC)    Atrial fibrillation and flutter (HCC)    a.) CHA2DS2VASc = 5 (age x 2, sex, HTN, vascular disease history);  b.) rate/rhythm maintained on oral amiodarone + metoprolol succinate; chronically anticoagulated with dose reduced apixaban   CAD (coronary artery disease)    Depression    Descending thoracic aortic aneurysm (HCC)    a.) CT chest 09/29/2022: 4.9 cm; b.) CT abd 01/13/2023: 4.9 cm   Frequent PVCs    GERD (gastroesophageal reflux disease)    Hepatic steatosis    History of 2019 novel coronavirus disease (COVID-19) 12/04/2021   Hyperlipidemia    Hypertension    Hypokalemia    Hypothyroidism    Long term current use of amiodarone    Long term current use of anticoagulant    a.) dose reduced apixaban   Nephrolithiasis    Osteopenia    Polymyalgia rheumatica (HCC)    Skin cancer     Splenomegaly    Temporal arteritis (HCC)    Vitamin D deficiency     Past Surgical History:  Procedure Laterality Date   ABDOMINAL HYSTERECTOMY  03/1971   Status Post Hysterectomy   CATARACT EXTRACTION Left    COLONOSCOPY     2010   COLONOSCOPY WITH PROPOFOL N/A 01/12/2022   Procedure: COLONOSCOPY WITH PROPOFOL;  Surgeon: Midge Minium, MD;  Location: [facility] ENDOSCOPY;  Service: Endoscopy;  Laterality: N/A;   CYSTOSCOPY WITH RETROGRADE PYELOGRAM, URETEROSCOPY AND STENT PLACEMENT Left 01/13/2023   Procedure: CYSTOSCOPY WITH RETROGRADE PYELOGRAM, URETEROSCOPY AND STENT PLACEMENT;  Surgeon: Riki Altes, MD;  Location: [facility] ORS;  Service: Urology;  Laterality: Left;   CYSTOSCOPY/URETEROSCOPY/HOLMIUM LASER/STENT PLACEMENT Left 02/08/2023   Procedure: CYSTOSCOPY/URETEROSCOPY/HOLMIUM LASER/STENT EXCHANGE;  Surgeon: Riki Altes, MD;  Location: [facility] ORS;  Service: Urology;  Laterality: Left;   ESOPHAGOGASTRODUODENOSCOPY N/A 11/02/2022   Procedure: ESOPHAGOGASTRODUODENOSCOPY (EGD);  Surgeon: Midge Minium, MD;  Location: [facility] ENDOSCOPY;  Service: Endoscopy;  Laterality: N/A;   EYE SURGERY     GIVENS CAPSULE STUDY N/A 12/22/2022   Procedure: GIVENS CAPSULE STUDY;  Surgeon: Midge Minium, MD;  Location: [facility] ENDOSCOPY;  Service: Endoscopy;  Laterality: N/A;   TONSILLECTOMY     TONSILLECTOMY AND ADENOIDECTOMY  1951   WISDOM TOOTH EXTRACTION  1970    VS assessement:  Following dynamic movement Sitting: 158/68 HR 53 Standing 154/63 HR 55 Asymptomatic in sitting     Subjective Assessment     Subjective Patient reports doing well. Virtual appointment with Cardiac MD last Friday, mild change in HR medication. And d/c metoprolol. No LOB reported by PT over the weekend. Was able to attend family reunion in [place], [place]. No issues reported, but husband states that she had elevated BP last night, so he gave her 1/2 of his metoprolol pill to aid in BP management.       Pertinent History Tara Marquez is an 82yoF who is referred by PCP due to gradual progressive weakness since hospital admission in February 2024. Pt presents to PT eval with husband. Pt reports being in hospital for ~ a week in Feb 2024 with urinary obstruction, AKI, felt week initially but declined HHPT; pt/husband say progressively more weak over subsequent months, more difficulty with walking, husband has now asked her to use a cane. Pt reports 5 falls, 4 of which were outside on natural surface 5th trying to clear a threshold (posterior fall buttock, back, hit head). Does get dizzy, but not frequently, not related to falls often.    How long can you sit comfortably? Not a limitation    How long can you stand comfortably? Not a limitation    How long can you walk comfortably? Mostly walks household distances, but can tolerate occasional store trips with buggy support.    Currently in Pain? No/denies             Taken at [facility] unless otherwise noted.    [facility] PT Assessment -       Assessment   Medical Diagnosis Generalized weakness, repeated falls    Referring Provider (PT) Julieanne Manson MD (PCP)    Onset Date/Surgical Date 02/10/23    Prior Therapy declined HHPT at [facility] in Feb      Precautions   Precautions Fall      Balance Screen   Has the patient fallen in the past 6 months Yes    How many times? 5    Has the patient had a decrease in activity level because of a fear of falling?  Yes    Is the patient reluctant to leave their home because of a fear of falling?  Yes      Home Environment   Living Environment Private residence    Living Arrangements Spouse/significant other;Children    Available Help at Discharge Family    Type of Home House    Home Access Stairs to enter    Entrance Stairs-Number of Steps 2 or 4   both have unilateral support options   Home Layout Able to live on main level with bedroom/bathroom;Full bath on main level;Multi-level    Alternate Level Stairs-Number of Steps 13     Home Equipment Cane - single point      Prior Function   Level of Independence Independent    Vocation Retired      IT consultant   Overall Cognitive Status Within Functional Limits for tasks assessed   pt reports to feel some mil decline since hospitalization, but nothing significant     Observation/Other Assessments   Focus on Therapeutic Outcomes (FOTO)  pending visit 2      Transfers   Five time sit to stand comments  deferred due to elevated BP      Ambulation/Gait   Ambulation/Gait Yes    Ambulation Distance (Feet) 300 Feet  Assistive device Straight cane    Gait Pattern Poor foot clearance - right   worse over time with intermittent light scufing over final 118ft   Ambulation Surface Level;Indoor    Gait velocity 0.86m/s    Gait Comments Pt reports to feel unsteady most of time, intermitent SPC use in AMB, but does not espeically improve sense of stability             Strength Screening: -MMT seated hip flexion: Left 5/5, Right 4/5  -MMT seated ankle DF: Left 5/5, Right 5-/5 (appears/feel weaker, but maintains position) Seated ankle DF 1x15x3secH (loss of excursion in ROM on Rt side by #15; left unchanged)   Objective measurements completed on examination: See above findings.    TODAY'S TREATMENT:   See above for BP assessment:   Throughout session, PT provided CGA unless otherwise noted for safety and correct mild LOB.   Standing on airex pad:  Eyes open 2 x 30 sec.  Forward foot tap on 4 inch stepx 2 x 8 bil.  Static stand with 1 foot on 4inch step. 2 x 20 sec bil  Lateral foot tap on 4inch step 2 x 8 bil  Noted to require min assist intermittently when LLE standing on airex pad to improved awareness of posterior at lateral LOB, also noted to have decreased pelvic stability.   Gait training: 2 x 137ft without UE support, 2 mild LOB to the L, stepping strategy to the R to prevent completed LOB to the L with mina assist from PT for safety     Hip abduction  sitting in chair YTB x 12 with 3 sec hold  Seated LAQ 3#ankle weights.   Throughout session, cues from PT for decreased speed of movement to improve muscle recruitment.   No evidence of significantly elevated BP or s/s of orthostatic s/s     PT Education -     Education Details Pt educated throughout session about proper posture and technique with exercises. Improved exercise technique, movement at target joints, use of target muscles after min to mod verbal, visual, tactile cues.    Person(s) Educated Patient;Spouse    Methods Explanation;Demonstration;Handout    Comprehension Need further instruction;Verbalized understanding              PT Short Term Goals -        PT SHORT TERM GOAL #1   Title compliant with balance HEP    Time 4    Period Weeks    Status New    Target Date 08/12/23      PT SHORT TERM GOAL #2   Title Improved overground gait speed over 532ft to improve ability to access limited community distances safely.    Baseline eval: 329ft at 0.67m/s    Time 4    Period Weeks    Status New    Target Date 08/12/23               PT Long Term Goals -       PT LONG TERM GOAL #1   Title FOTO score improvement >12 points to indicate improve confidence in balance.    Baseline 07/19/2023=43   Time 8    Period Weeks    Status New    Target Date 09/09/23      PT LONG TERM GOAL #2   Title Improvement in standardized balance assessment by MCID or greater.    Baseline 07/19/2023= BERG- 35/56   Time 8    Period  Weeks    Status New    Target Date 09/09/23      PT LONG TERM GOAL #3   Title Pt to demonstrate 5xSTS hands free from standard height surface to indicated improved BLE power.    Baseline 07/19/2023= 31.07 sec without UE Support   Time 8    Period Weeks    Status New    Target Date 09/09/23      PT LONG TERM GOAL #4   Title Pt to demonstrate performance of 2024ft AMB >0.37m/s without increased pain and without LOB at modI level or less to  restore ability to access comunity distances for IADL.    Time 8    Period Weeks    Status New    Target Date 09/09/23                 Plan -     Clinical Impression Statement Treatment consisted of BLE strengthening with HEP provided and continuation of balance program. Pt tolerated treatment well, several lateral and posterior LOB, requiring assist from PT to correct LOB and prevent fall while standing on airex pad and with functional gait through rehab gym. Noted to have closer to WNL BP on this day. PT encouraged pt to notify MD of medication provided outside of prescription as noted in subjective section.  Patient will benefit from skilled PT services to address and improve her functional strength and mobility to assist in return to previous level of function.    Personal Factors and Comorbidities Age;Fitness;Behavior Pattern;Past/Current Experience    Examination-Activity Limitations Locomotion Level;Transfers;Lift;Stairs    Examination-Participation Restrictions Meal Prep;Cleaning;Community Activity;Driving;Interpersonal Relationship;Yard Work    Stability/Clinical Decision Making Stable/Uncomplicated    Clinical Decision Making Moderate    Rehab Potential Good    PT Frequency 2x / week    PT Duration 8 weeks    PT Treatment/Interventions ADLs/Self Care Home Management;Gait training;Stair training;Functional mobility training;Neuromuscular re-education;Balance training;Therapeutic exercise;Therapeutic activities;Passive range of motion;Manual techniques    PT Next Visit Plan Check vitals,  establish salient balance home exercises, Begin therex for LE strengthening.    PT Home Exercise Plan asked at eval to check and log BP twice daily,   Access Code: [email or web address] URL: [email or web address] Date: 07/21/2023 Prepared by: Grier Rocher  Exercises - Sit to Stand with Armchair  - 1 x daily - 5 x weekly - 2 sets - 6 reps - Standing Hip Abduction with Counter Support  -  1 x daily - 7 x weekly - 3 sets - 10 reps - Seated Hip Abduction with Resistance  - 1 x daily - 7 x weekly - 3 sets - 10 reps - Seated March with Resistance  - 1 x daily - 7 x weekly - 3 sets - 10 reps   Consulted and Agree with Plan of Care Patient;Family member/caregiver             Patient will benefit from skilled therapeutic intervention in order to improve the following deficits and impairments:   balance impairments, endurance deficits, reduced strength,   Visit Diagnosis: Unsteadiness on feet  Repeated falls  Muscle weakness (generalized)     Problem List Patient Active Problem List   Diagnosis Date Noted   Obstructive nephropathy 01/13/2023   Acute pyelonephritis 01/13/2023   AKI (acute kidney injury) (HCC) 01/13/2023   Hypokalemia 01/13/2023   Diarrhea 01/13/2023   Thoracic aortic aneurysm without rupture (HCC) 11/09/2022   Hematuria 11/05/2022   Polyp of esophagus 11/02/2022   Iron deficiency  anemia 07/28/2022   Positive colorectal cancer screening using Cologuard test    Abnormal ECG 03/31/2021   Acute non-recurrent sinusitis 11/30/2020   Acute otitis media 11/30/2020   Benign essential HTN 10/31/2018   Frequent PVCs 09/08/2017   A-fib (HCC) 08/27/2015   Clinical depression 08/27/2015   Blood pressure elevated 08/27/2015   Polypharmacy 08/27/2015   Esophagitis, reflux 08/27/2015   Fatigue 08/27/2015   Acid reflux 08/27/2015   HLD (hyperlipidemia) 08/27/2015   Adult hypothyroidism 08/27/2015   Adaptive colitis 08/27/2015   Malaise and fatigue 08/27/2015   Mild major depression (HCC) 08/27/2015   Polymyalgia rheumatica (HCC) 08/27/2015   Cranial arteritis (HCC) 08/27/2015   Avitaminosis D 08/27/2015    3:56 PM, 07/25/23   Golden Pop, PT 07/25/2023, 3:56 PM  [facility] Outpatient Rehabilitation at [facility] [address] [place], [place], [address] Phone: [phone]   Fax:  [phone]  Name: VERDIS GRANAT MRN: [id number] Date of Birth: 14-Mar-1941

## 2023-07-27 ENCOUNTER — Ambulatory Visit: Payer: Medicare Other

## 2023-07-27 DIAGNOSIS — R2681 Unsteadiness on feet: Secondary | ICD-10-CM

## 2023-07-27 DIAGNOSIS — R296 Repeated falls: Secondary | ICD-10-CM

## 2023-07-27 DIAGNOSIS — M6281 Muscle weakness (generalized): Secondary | ICD-10-CM | POA: Diagnosis not present

## 2023-07-27 NOTE — Therapy (Signed)
[facility] Outpatient Rehabilitation at [facility] [address] [place], [place], [address] Phone: [phone]   Fax:  [phone]  Outpatient Physical Therapy Treatment  Patient Details  Name: Tara Marquez MRN: [id number] Date of Birth: 04-Jun-1941 Referring Provider (PT): Julieanne Manson MD (PCP)   Encounter Date: 07/27/2023   PT End of Session - 07/27/23 1410     Visit Number 5    Number of Visits 17    Date for PT Re-Evaluation 09/08/23    Authorization Type Medicare A&B primary with BCBS supplemental secondary    Authorization Time Period 07/14/23-09/08/23    Progress Note Due on Visit 10    PT Start Time 1405    PT Stop Time 1444    PT Time Calculation (min) 39 min    Equipment Utilized During Treatment Gait belt    Activity Tolerance Patient tolerated treatment well;No increased pain    Behavior During Therapy WFL for tasks assessed/performed              Past Medical History:  Diagnosis Date   Acute pyelonephritis    Adaptive colitis    Anemia    Aortic atherosclerosis (HCC)    Atrial fibrillation and flutter (HCC)    a.) CHA2DS2VASc = 5 (age x 2, sex, HTN, vascular disease history);  b.) rate/rhythm maintained on oral amiodarone + metoprolol succinate; chronically anticoagulated with dose reduced apixaban   CAD (coronary artery disease)    Depression    Descending thoracic aortic aneurysm (HCC)    a.) CT chest 09/29/2022: 4.9 cm; b.) CT abd 01/13/2023: 4.9 cm   Frequent PVCs    GERD (gastroesophageal reflux disease)    Hepatic steatosis    History of 2019 novel coronavirus disease (COVID-19) 12/04/2021   Hyperlipidemia    Hypertension    Hypokalemia    Hypothyroidism    Long term current use of amiodarone    Long term current use of anticoagulant    a.) dose reduced apixaban   Nephrolithiasis    Osteopenia    Polymyalgia rheumatica (HCC)    Skin cancer    Splenomegaly    Temporal arteritis (HCC)    Vitamin D deficiency      Past Surgical History:  Procedure Laterality Date   ABDOMINAL HYSTERECTOMY  03/1971   Status Post Hysterectomy   CATARACT EXTRACTION Left    COLONOSCOPY     2010   COLONOSCOPY WITH PROPOFOL N/A 01/12/2022   Procedure: COLONOSCOPY WITH PROPOFOL;  Surgeon: Midge Minium, MD;  Location: [facility] ENDOSCOPY;  Service: Endoscopy;  Laterality: N/A;   CYSTOSCOPY WITH RETROGRADE PYELOGRAM, URETEROSCOPY AND STENT PLACEMENT Left 01/13/2023   Procedure: CYSTOSCOPY WITH RETROGRADE PYELOGRAM, URETEROSCOPY AND STENT PLACEMENT;  Surgeon: Riki Altes, MD;  Location: [facility] ORS;  Service: Urology;  Laterality: Left;   CYSTOSCOPY/URETEROSCOPY/HOLMIUM LASER/STENT PLACEMENT Left 02/08/2023   Procedure: CYSTOSCOPY/URETEROSCOPY/HOLMIUM LASER/STENT EXCHANGE;  Surgeon: Riki Altes, MD;  Location: [facility] ORS;  Service: Urology;  Laterality: Left;   ESOPHAGOGASTRODUODENOSCOPY N/A 11/02/2022   Procedure: ESOPHAGOGASTRODUODENOSCOPY (EGD);  Surgeon: Midge Minium, MD;  Location: [facility] ENDOSCOPY;  Service: Endoscopy;  Laterality: N/A;   EYE SURGERY     GIVENS CAPSULE STUDY N/A 12/22/2022   Procedure: GIVENS CAPSULE STUDY;  Surgeon: Midge Minium, MD;  Location: [facility] ENDOSCOPY;  Service: Endoscopy;  Laterality: N/A;   TONSILLECTOMY     TONSILLECTOMY AND ADENOIDECTOMY  1951   WISDOM TOOTH EXTRACTION  1970    VS assessement:   Following dynamic movement Sitting: 158/68  HR 53 Standing 154/63 HR 55 Asymptomatic in sitting     Subjective Assessment     Subjective Patient reports doing well. Virtual appointment with Cardiac MD last Friday, mild change in HR medication. And d/c metoprolol. No LOB reported by PT over the weekend. Was able to attend family reunion in [place], [place]. No issues reported, but husband states that she had elevated BP last night, so he gave her 1/2 of his metoprolol pill to aid in BP management.       Pertinent History Tara Marquez is an 82yoF who is referred by PCP due to gradual progressive  weakness since hospital admission in February 2024. Pt presents to PT eval with husband. Pt reports being in hospital for ~ a week in Feb 2024 with urinary obstruction, AKI, felt week initially but declined HHPT; pt/husband say progressively more weak over subsequent months, more difficulty with walking, husband has now asked her to use a cane. Pt reports 5 falls, 4 of which were outside on natural surface 5th trying to clear a threshold (posterior fall buttock, back, hit head). Does get dizzy, but not frequently, not related to falls often.    How long can you sit comfortably? Not a limitation    How long can you stand comfortably? Not a limitation    How long can you walk comfortably? Mostly walks household distances, but can tolerate occasional store trips with buggy support.    Currently in Pain? No/denies             Taken at [facility] unless otherwise noted.    [facility] PT Assessment -       Assessment   Medical Diagnosis Generalized weakness, repeated falls    Referring Provider (PT) Julieanne Manson MD (PCP)    Onset Date/Surgical Date 02/10/23    Prior Therapy declined HHPT at [facility] in Feb      Precautions   Precautions Fall      Balance Screen   Has the patient fallen in the past 6 months Yes    How many times? 5    Has the patient had a decrease in activity level because of a fear of falling?  Yes    Is the patient reluctant to leave their home because of a fear of falling?  Yes      Home Environment   Living Environment Private residence    Living Arrangements Spouse/significant other;Children    Available Help at Discharge Family    Type of Home House    Home Access Stairs to enter    Entrance Stairs-Number of Steps 2 or 4   both have unilateral support options   Home Layout Able to live on main level with bedroom/bathroom;Full bath on main level;Multi-level    Alternate Level Stairs-Number of Steps 13    Home Equipment Cane - single point      Prior Function   Level of  Independence Independent    Vocation Retired      IT consultant   Overall Cognitive Status Within Functional Limits for tasks assessed   pt reports to feel some mil decline since hospitalization, but nothing significant     Observation/Other Assessments   Focus on Therapeutic Outcomes (FOTO)  pending visit 2      Transfers   Five time sit to stand comments  deferred due to elevated BP      Ambulation/Gait   Ambulation/Gait Yes    Ambulation Distance (Feet) 300 Feet    Assistive device Straight cane  Gait Pattern Poor foot clearance - right   worse over time with intermittent light scufing over final 159ft   Ambulation Surface Level;Indoor    Gait velocity 0.39m/s    Gait Comments Pt reports to feel unsteady most of time, intermitent SPC use in AMB, but does not espeically improve sense of stability             Strength Screening: -MMT seated hip flexion: Left 5/5, Right 4/5  -MMT seated ankle DF: Left 5/5, Right 5-/5 (appears/feel weaker, but maintains position) Seated ankle DF 1x15x3secH (loss of excursion in ROM on Rt side by #15; left unchanged)   Objective measurements completed on examination: See above findings.    TODAY'S TREATMENT: 07/27/23   BP= 157/57 mmHg sitting after walking into clinic Left UE- Patient complaining of slight dizziness.    Seated Hip march- 2 sets of 15 reps Seated hip ext - blue TB 1 sets 15 reps Seated Hip abd- 2 sets of 10 reps Seated knee ext - AROM with 3 sec hold x 12 reps each LE Seated ham curl- RTB 2 sets of 10 reps Seated calf raise (from 1/2 foam roll) x 12 reps with 3 sec hold       PT Education -     Education Details Pt educated throughout session about proper posture and technique with exercises. Improved exercise technique, movement at target joints, use of target muscles after min to mod verbal, visual, tactile cues.    Person(s) Educated Patient;Spouse    Methods Explanation;Demonstration;Handout    Comprehension  Need further instruction;Verbalized understanding              PT Short Term Goals -        PT SHORT TERM GOAL #1   Title compliant with balance HEP    Time 4    Period Weeks    Status New    Target Date 08/12/23      PT SHORT TERM GOAL #2   Title Improved overground gait speed over 534ft to improve ability to access limited community distances safely.    Baseline eval: 316ft at 0.73m/s    Time 4    Period Weeks    Status New    Target Date 08/12/23               PT Long Term Goals -       PT LONG TERM GOAL #1   Title FOTO score improvement >12 points to indicate improve confidence in balance.    Baseline 07/19/2023=43   Time 8    Period Weeks    Status New    Target Date 09/09/23      PT LONG TERM GOAL #2   Title Improvement in standardized balance assessment by MCID or greater.    Baseline 07/19/2023= BERG- 35/56   Time 8    Period Weeks    Status New    Target Date 09/09/23      PT LONG TERM GOAL #3   Title Pt to demonstrate 5xSTS hands free from standard height surface to indicated improved BLE power.    Baseline 07/19/2023= 31.07 sec without UE Support   Time 8    Period Weeks    Status New    Target Date 09/09/23      PT LONG TERM GOAL #4   Title Pt to demonstrate performance of 2090ft AMB >0.37m/s without increased pain and without LOB at modI level or less to restore ability to access  comunity distances for IADL.    Time 8    Period Weeks    Status New    Target Date 09/09/23                 Plan -     Clinical Impression Statement Treatment limited to seated therex today due to her complaint of dizziness. She was fine with seated therex and able to add some resistance to several exercises without any ill effect other than fatigue. Will plan to resume some standing/balance activities next visit as appropriate. Patient will benefit from skilled PT services to address and improve her functional strength and mobility to assist in return  to previous level of function.    Personal Factors and Comorbidities Age;Fitness;Behavior Pattern;Past/Current Experience    Examination-Activity Limitations Locomotion Level;Transfers;Lift;Stairs    Examination-Participation Restrictions Meal Prep;Cleaning;Community Activity;Driving;Interpersonal Relationship;Yard Work    Stability/Clinical Decision Making Stable/Uncomplicated    Clinical Decision Making Moderate    Rehab Potential Good    PT Frequency 2x / week    PT Duration 8 weeks    PT Treatment/Interventions ADLs/Self Care Home Management;Gait training;Stair training;Functional mobility training;Neuromuscular re-education;Balance training;Therapeutic exercise;Therapeutic activities;Passive range of motion;Manual techniques    PT Next Visit Plan Check vitals,  continue with balance and therex vor home exercises.    PT Home Exercise Plan asked at eval to check and log BP twice daily,   Access Code: [email or web address] URL: [email or web address] Date: 07/21/2023 Prepared by: Grier Rocher  Exercises - Sit to Stand with Armchair  - 1 x daily - 5 x weekly - 2 sets - 6 reps - Standing Hip Abduction with Counter Support  - 1 x daily - 7 x weekly - 3 sets - 10 reps - Seated Hip Abduction with Resistance  - 1 x daily - 7 x weekly - 3 sets - 10 reps - Seated March with Resistance  - 1 x daily - 7 x weekly - 3 sets - 10 reps   Consulted and Agree with Plan of Care Patient;Family member/caregiver             Patient will benefit from skilled therapeutic intervention in order to improve the following deficits and impairments:   balance impairments, endurance deficits, reduced strength,   Visit Diagnosis: Unsteadiness on feet  Repeated falls  Muscle weakness (generalized)     Problem List Patient Active Problem List   Diagnosis Date Noted   Obstructive nephropathy 01/13/2023   Acute pyelonephritis 01/13/2023   AKI (acute kidney injury) (HCC) 01/13/2023   Hypokalemia  01/13/2023   Diarrhea 01/13/2023   Thoracic aortic aneurysm without rupture (HCC) 11/09/2022   Hematuria 11/05/2022   Polyp of esophagus 11/02/2022   Iron deficiency anemia 07/28/2022   Positive colorectal cancer screening using Cologuard test    Abnormal ECG 03/31/2021   Acute non-recurrent sinusitis 11/30/2020   Acute otitis media 11/30/2020   Benign essential HTN 10/31/2018   Frequent PVCs 09/08/2017   A-fib (HCC) 08/27/2015   Clinical depression 08/27/2015   Blood pressure elevated 08/27/2015   Polypharmacy 08/27/2015   Esophagitis, reflux 08/27/2015   Fatigue 08/27/2015   Acid reflux 08/27/2015   HLD (hyperlipidemia) 08/27/2015   Adult hypothyroidism 08/27/2015   Adaptive colitis 08/27/2015   Malaise and fatigue 08/27/2015   Mild major depression (HCC) 08/27/2015   Polymyalgia rheumatica (HCC) 08/27/2015   Cranial arteritis (HCC) 08/27/2015   Avitaminosis D 08/27/2015    4:43 PM, 07/27/23   Lenda Kelp, PT 07/27/2023, 4:43 PM  [facility] Outpatient Rehabilitation at [facility] [address] [place], [place], [address] Phone: [phone]   Fax:  [phone]  Name: RAEANA RESOP MRN: [id number] Date of Birth: 1941-01-26

## 2023-08-02 ENCOUNTER — Ambulatory Visit: Payer: Medicare Other | Attending: Family Medicine

## 2023-08-02 ENCOUNTER — Ambulatory Visit: Payer: Medicare Other

## 2023-08-02 DIAGNOSIS — M6281 Muscle weakness (generalized): Secondary | ICD-10-CM | POA: Diagnosis not present

## 2023-08-02 DIAGNOSIS — R296 Repeated falls: Secondary | ICD-10-CM

## 2023-08-02 DIAGNOSIS — R262 Difficulty in walking, not elsewhere classified: Secondary | ICD-10-CM | POA: Diagnosis not present

## 2023-08-02 DIAGNOSIS — R2681 Unsteadiness on feet: Secondary | ICD-10-CM

## 2023-08-02 NOTE — Therapy (Signed)
[facility] Outpatient Rehabilitation at [facility] [address] [place], [place], [address] Phone: [phone]   Fax:  [phone]  Outpatient Physical Therapy Treatment  Patient Details  Name: Tara Marquez MRN: [id number] Date of Birth: [birth year] Referring Provider (PT): Julieanne Manson MD (PCP)   Encounter Date: 08/02/2023   PT End of Session - 08/02/23 1320     Visit Number 6    Number of Visits 17    Date for PT Re-Evaluation 09/08/23    Authorization Type Medicare A&B primary with BCBS supplemental secondary    Authorization Time Period 07/14/23-09/08/23    Progress Note Due on Visit 10    PT Start Time 1315    PT Stop Time 1355    PT Time Calculation (min) 40 min    Equipment Utilized During Treatment Gait belt    Activity Tolerance Patient tolerated treatment well;No increased pain    Behavior During Therapy WFL for tasks assessed/performed              Past Medical History:  Diagnosis Date   Acute pyelonephritis    Adaptive colitis    Anemia    Aortic atherosclerosis (HCC)    Atrial fibrillation and flutter (HCC)    a.) CHA2DS2VASc = 5 (age x 2, sex, HTN, vascular disease history);  b.) rate/rhythm maintained on oral amiodarone + metoprolol succinate; chronically anticoagulated with dose reduced apixaban   CAD (coronary artery disease)    Depression    Descending thoracic aortic aneurysm (HCC)    a.) CT chest 09/29/2022: 4.9 cm; b.) CT abd 01/13/2023: 4.9 cm   Frequent PVCs    GERD (gastroesophageal reflux disease)    Hepatic steatosis    History of 2019 novel coronavirus disease (COVID-19) 12/04/2021   Hyperlipidemia    Hypertension    Hypokalemia    Hypothyroidism    Long term current use of amiodarone    Long term current use of anticoagulant    a.) dose reduced apixaban   Nephrolithiasis    Osteopenia    Polymyalgia rheumatica (HCC)    Skin cancer    Splenomegaly    Temporal arteritis (HCC)    Vitamin D deficiency      Past Surgical History:  Procedure Laterality Date   ABDOMINAL HYSTERECTOMY  03/1971   Status Post Hysterectomy   CATARACT EXTRACTION Left    COLONOSCOPY     2010   COLONOSCOPY WITH PROPOFOL N/A 01/12/2022   Procedure: COLONOSCOPY WITH PROPOFOL;  Surgeon: Midge Minium, MD;  Location: [facility] ENDOSCOPY;  Service: Endoscopy;  Laterality: N/A;   CYSTOSCOPY WITH RETROGRADE PYELOGRAM, URETEROSCOPY AND STENT PLACEMENT Left 01/13/2023   Procedure: CYSTOSCOPY WITH RETROGRADE PYELOGRAM, URETEROSCOPY AND STENT PLACEMENT;  Surgeon: Riki Altes, MD;  Location: [facility] ORS;  Service: Urology;  Laterality: Left;   CYSTOSCOPY/URETEROSCOPY/HOLMIUM LASER/STENT PLACEMENT Left 02/08/2023   Procedure: CYSTOSCOPY/URETEROSCOPY/HOLMIUM LASER/STENT EXCHANGE;  Surgeon: Riki Altes, MD;  Location: [facility] ORS;  Service: Urology;  Laterality: Left;   ESOPHAGOGASTRODUODENOSCOPY N/A 11/02/2022   Procedure: ESOPHAGOGASTRODUODENOSCOPY (EGD);  Surgeon: Midge Minium, MD;  Location: [facility] ENDOSCOPY;  Service: Endoscopy;  Laterality: N/A;   EYE SURGERY     GIVENS CAPSULE STUDY N/A 12/22/2022   Procedure: GIVENS CAPSULE STUDY;  Surgeon: Midge Minium, MD;  Location: [facility] ENDOSCOPY;  Service: Endoscopy;  Laterality: N/A;   TONSILLECTOMY     TONSILLECTOMY AND ADENOIDECTOMY  1951   WISDOM TOOTH EXTRACTION  1970    VS assessement:  150/70 mmHg 66 bpm, seated  Subjective Assessment     Subjective Still having persistent dizziness and nausea most of day. Not provoked and symptoms stable per report. No aggravation with activity, nor with being in car. Pt reports working on HEP regularly.     Pertinent History Tara Marquez is an 82yoF who is referred by PCP due to gradual progressive weakness since hospital admission in February 2024. Pt presents to PT eval with husband. Pt reports being in hospital for ~ a week in Feb 2024 with urinary obstruction, AKI, felt week initially but declined HHPT; pt/husband say progressively  more weak over subsequent months, more difficulty with walking, husband has now asked her to use a cane. Pt reports 5 falls, 4 of which were outside on natural surface 5th trying to clear a threshold (posterior fall buttock, back, hit head). Does get dizzy, but not frequently, not related to falls often.    How long can you sit comfortably? Not a limitation    How long can you stand comfortably? Not a limitation    How long can you walk comfortably? Mostly walks household distances, but can tolerate occasional store trips with buggy support.    Currently in Pain? No/denies               Objective measurements completed on examination: See above findings.    TODAY'S TREATMENT: 08/02/23  -AMB overground SPC RUE 378ft 3' 11 seconds  -STS from plinth, x10 hands free -AMB overground SPC RUE 366ft 3' 54 seconds, 1lb weights on ankles, and redTB around knees -STS from plinth, x10 hands free  -functional activity training: 1lb AW bilat, redTB at upper knees, SPC used -Pt carries 10 items individually from zone 1 to zone 2; items range from zero to 3 lbs, 5 are stored at counter height and 5 are on floor. Pt carries them across a 39ft distance with red soft mat in middle of path, then returns empty handed for next item.       PT Education -     Education Details Pt educated about getting more involved with helping out bringing in and putting away groceries.                          PT Short Term Goals -        PT SHORT TERM GOAL #1   Title compliant with balance HEP    Time 4    Period Weeks    Status New    Target Date 08/12/23      PT SHORT TERM GOAL #2   Title Improved overground gait speed over 550ft to improve ability to access limited community distances safely.    Baseline eval: 376ft at 0.69m/s    Time 4    Period Weeks    Status New    Target Date 08/12/23               PT Long Term Goals -       PT LONG TERM GOAL #1   Title FOTO score  improvement >12 points to indicate improve confidence in balance.    Baseline 07/19/2023=43   Time 8    Period Weeks    Status New    Target Date 09/09/23      PT LONG TERM GOAL #2   Title Improvement in standardized balance assessment by MCID or greater.    Baseline 07/19/2023= BERG- 35/56   Time 8    Period Weeks  Status New    Target Date 09/09/23      PT LONG TERM GOAL #3   Title Pt to demonstrate 5xSTS hands free from standard height surface to indicated improved BLE power.    Baseline 07/19/2023= 31.07 sec without UE Support   Time 8    Period Weeks    Status New    Target Date 09/09/23      PT LONG TERM GOAL #4   Title Pt to demonstrate performance of 2038ft AMB >0.69m/s without increased pain and without LOB at modI level or less to restore ability to access comunity distances for IADL.    Time 8    Period Weeks    Status New    Target Date 09/09/23                 Plan -     Clinical Impression Statement Still dizzy and nauseated but pt not worse with exertion in session, pt denies worse with car ride over and worse and not with longer distance walking. Continued to work on progression in walking volume. Pt does well with functional training activity, appropriate use of SPC for stability, but gait speed variability is poor which is a limitation in safety. Patient will benefit from skilled PT services to address and improve her functional strength and mobility to assist in return to previous level of function.    Personal Factors and Comorbidities Age;Fitness;Behavior Pattern;Past/Current Experience    Examination-Activity Limitations Locomotion Level;Transfers;Lift;Stairs    Examination-Participation Restrictions Meal Prep;Cleaning;Community Activity;Driving;Interpersonal Relationship;Yard Work    Stability/Clinical Decision Making Stable/Uncomplicated    Clinical Decision Making Moderate    Rehab Potential Good    PT Frequency 2x / week    PT Duration 8 weeks     PT Treatment/Interventions ADLs/Self Care Home Management;Gait training;Stair training;Functional mobility training;Neuromuscular re-education;Balance training;Therapeutic exercise;Therapeutic activities;Passive range of motion;Manual techniques    PT Next Visit Plan Check vitals,  continue with balance and therex vor home exercises.    PT Home Exercise Plan asked at eval to check and log BP twice daily,   Access Code: [email or web address] URL: [email or web address] Date: 07/21/2023 Prepared by: Grier Rocher  Exercises - Sit to Stand with Armchair  - 1 x daily - 5 x weekly - 2 sets - 6 reps - Standing Hip Abduction with Counter Support  - 1 x daily - 7 x weekly - 3 sets - 10 reps - Seated Hip Abduction with Resistance  - 1 x daily - 7 x weekly - 3 sets - 10 reps - Seated March with Resistance  - 1 x daily - 7 x weekly - 3 sets - 10 reps   Consulted and Agree with Plan of Care Patient;Family member/caregiver             Patient will benefit from skilled therapeutic intervention in order to improve the following deficits and impairments:   balance impairments, endurance deficits, reduced strength,   Visit Diagnosis: Unsteadiness on feet  Repeated falls  Muscle weakness (generalized)     Problem List Patient Active Problem List   Diagnosis Date Noted   Obstructive nephropathy 01/13/2023   Acute pyelonephritis 01/13/2023   AKI (acute kidney injury) (HCC) 01/13/2023   Hypokalemia 01/13/2023   Diarrhea 01/13/2023   Thoracic aortic aneurysm without rupture (HCC) 11/09/2022   Hematuria 11/05/2022   Polyp of esophagus 11/02/2022   Iron deficiency anemia 07/28/2022   Positive colorectal cancer screening using Cologuard test    Abnormal ECG 03/31/2021  Acute non-recurrent sinusitis 11/30/2020   Acute otitis media 11/30/2020   Benign essential HTN 10/31/2018   Frequent PVCs 09/08/2017   A-fib (HCC) 08/27/2015   Clinical depression 08/27/2015   Blood pressure elevated  08/27/2015   Polypharmacy 08/27/2015   Esophagitis, reflux 08/27/2015   Fatigue 08/27/2015   Acid reflux 08/27/2015   HLD (hyperlipidemia) 08/27/2015   Adult hypothyroidism 08/27/2015   Adaptive colitis 08/27/2015   Malaise and fatigue 08/27/2015   Mild major depression (HCC) 08/27/2015   Polymyalgia rheumatica (HCC) 08/27/2015   Cranial arteritis (HCC) 08/27/2015   Avitaminosis D 08/27/2015    1:23 PM, 08/02/23   , C, PT 08/02/2023, 1:23 PM  3:49 PM, 08/02/23 Rosamaria Lints, PT, DPT Physical Therapist - [facility]  Outpatient Physical Therapy- Main Campus [phone]     [facility] Outpatient Rehabilitation at [facility] [address] [place], [place], [address] Phone: [phone]   Fax:  [phone]  Name: JOEN HEVNER MRN: [id number] Date of Birth: 1941/10/11

## 2023-08-03 ENCOUNTER — Ambulatory Visit: Payer: Medicare Other

## 2023-08-03 DIAGNOSIS — M6281 Muscle weakness (generalized): Secondary | ICD-10-CM

## 2023-08-03 DIAGNOSIS — R2681 Unsteadiness on feet: Secondary | ICD-10-CM

## 2023-08-03 DIAGNOSIS — R262 Difficulty in walking, not elsewhere classified: Secondary | ICD-10-CM

## 2023-08-03 DIAGNOSIS — R296 Repeated falls: Secondary | ICD-10-CM | POA: Diagnosis not present

## 2023-08-03 NOTE — Therapy (Signed)
[facility] Outpatient Rehabilitation at [facility] [address] [place], [place], [address] Phone: [phone]   Fax:  [phone]  Outpatient Physical Therapy Treatment  Patient Details  Name: Tara Marquez MRN: [id number] Date of Birth: January 19, 1941 Referring Provider (PT): Julieanne Manson MD (PCP)   Encounter Date: 08/03/2023   PT End of Session - 08/03/23 0853     Visit Number 7    Number of Visits 17    Date for PT Re-Evaluation 09/08/23    Authorization Type Medicare A&B primary with BCBS supplemental secondary    Authorization Time Period 07/14/23-09/08/23    Progress Note Due on Visit 10    PT Start Time 0805    PT Stop Time 0845    PT Time Calculation (min) 40 min    Equipment Utilized During Treatment Gait belt    Activity Tolerance Patient tolerated treatment well;No increased pain    Behavior During Therapy WFL for tasks assessed/performed               Past Medical History:  Diagnosis Date   Acute pyelonephritis    Adaptive colitis    Anemia    Aortic atherosclerosis (HCC)    Atrial fibrillation and flutter (HCC)    a.) CHA2DS2VASc = 5 (age x 2, sex, HTN, vascular disease history);  b.) rate/rhythm maintained on oral amiodarone + metoprolol succinate; chronically anticoagulated with dose reduced apixaban   CAD (coronary artery disease)    Depression    Descending thoracic aortic aneurysm (HCC)    a.) CT chest 09/29/2022: 4.9 cm; b.) CT abd 01/13/2023: 4.9 cm   Frequent PVCs    GERD (gastroesophageal reflux disease)    Hepatic steatosis    History of 2019 novel coronavirus disease (COVID-19) 12/04/2021   Hyperlipidemia    Hypertension    Hypokalemia    Hypothyroidism    Long term current use of amiodarone    Long term current use of anticoagulant    a.) dose reduced apixaban   Nephrolithiasis    Osteopenia    Polymyalgia rheumatica (HCC)    Skin cancer    Splenomegaly    Temporal arteritis (HCC)    Vitamin D deficiency      Past Surgical History:  Procedure Laterality Date   ABDOMINAL HYSTERECTOMY  03/1971   Status Post Hysterectomy   CATARACT EXTRACTION Left    COLONOSCOPY     2010   COLONOSCOPY WITH PROPOFOL N/A 01/12/2022   Procedure: COLONOSCOPY WITH PROPOFOL;  Surgeon: Midge Minium, MD;  Location: [facility] ENDOSCOPY;  Service: Endoscopy;  Laterality: N/A;   CYSTOSCOPY WITH RETROGRADE PYELOGRAM, URETEROSCOPY AND STENT PLACEMENT Left 01/13/2023   Procedure: CYSTOSCOPY WITH RETROGRADE PYELOGRAM, URETEROSCOPY AND STENT PLACEMENT;  Surgeon: Riki Altes, MD;  Location: [facility] ORS;  Service: Urology;  Laterality: Left;   CYSTOSCOPY/URETEROSCOPY/HOLMIUM LASER/STENT PLACEMENT Left 02/08/2023   Procedure: CYSTOSCOPY/URETEROSCOPY/HOLMIUM LASER/STENT EXCHANGE;  Surgeon: Riki Altes, MD;  Location: [facility] ORS;  Service: Urology;  Laterality: Left;   ESOPHAGOGASTRODUODENOSCOPY N/A 11/02/2022   Procedure: ESOPHAGOGASTRODUODENOSCOPY (EGD);  Surgeon: Midge Minium, MD;  Location: [facility] ENDOSCOPY;  Service: Endoscopy;  Laterality: N/A;   EYE SURGERY     GIVENS CAPSULE STUDY N/A 12/22/2022   Procedure: GIVENS CAPSULE STUDY;  Surgeon: Midge Minium, MD;  Location: [facility] ENDOSCOPY;  Service: Endoscopy;  Laterality: N/A;   TONSILLECTOMY     TONSILLECTOMY AND ADENOIDECTOMY  1951   WISDOM TOOTH EXTRACTION  1970         Subjective Assessment  Subjective Pt reports dizziness with turning her head but no spinning. She reports this has been going on for a few days, spouse reports thinks this occurred after a change of medication with change to ACE inhibitor. Changes they report were due to pt's high BP.    Pertinent History Tara Marquez is an 82yoF who is referred by PCP due to gradual progressive weakness since hospital admission in February 2024. Pt presents to PT eval with husband. Pt reports being in hospital for ~ a week in Feb 2024 with urinary obstruction, AKI, felt week initially but declined HHPT; pt/husband say  progressively more weak over subsequent months, more difficulty with walking, husband has now asked her to use a cane. Pt reports 5 falls, 4 of which were outside on natural surface 5th trying to clear a threshold (posterior fall buttock, back, hit head). Does get dizzy, but not frequently, not related to falls often.    How long can you sit comfortably? Not a limitation    How long can you stand comfortably? Not a limitation    How long can you walk comfortably? Mostly walks household distances, but can tolerate occasional store trips with buggy support.    Currently in Pain? No/denies               Objective measurements completed on examination: See above findings.    TODAY'S TREATMENT: 08/03/23   TE:  BP: 151/57 mmHg (84) HR 65 bpm  Seated Hip march- 2 sets of 15 reps. Rates hard Seated knee ext - 2x15 each LE Rates medium STS 2x10. Instruction in technique/'nose over toes," rates medium Standing hip abduction 6x, 10x eacj :E Standing hip ext 10x each LE  Seated DF 2x15 with 3 sec hold/rep. Cuing for increased AROM of R DF Seated calf raise (from 1/2 foam roll) x 15 reps with 3 sec hold. Pt rates easy  Standing calf raise 10x bilat.    Gait training: Amb with SPC x 18 ft cuing for sequencing of cane and heel-strike with RLE (pt tends to ambulate with foot flat, observed 2 instances of near LOB due to toe drag). Instructed pt not to attempt new technique yet at home toe ensure pt performing it correctly and safely first in PT. Pt verbalized understanding    PT Education -     Education Details Pt educated throughout session about proper posture and technique with exercises. Improved exercise technique, movement at target joints, use of target muscles after min to mod verbal, visual, tactile cues.                           PT Short Term Goals -        PT SHORT TERM GOAL #1   Title compliant with balance HEP    Time 4    Period Weeks    Status New    Target  Date 08/12/23      PT SHORT TERM GOAL #2   Title Improved overground gait speed over 542ft to improve ability to access limited community distances safely.    Baseline eval: 345ft at 0.15m/s    Time 4    Period Weeks    Status New    Target Date 08/12/23               PT Long Term Goals -       PT LONG TERM GOAL #1   Title FOTO score improvement >12 points  to indicate improve confidence in balance.    Baseline 07/19/2023=43   Time 8    Period Weeks    Status New    Target Date 09/09/23      PT LONG TERM GOAL #2   Title Improvement in standardized balance assessment by MCID or greater.    Baseline 07/19/2023= BERG- 35/56   Time 8    Period Weeks    Status New    Target Date 09/09/23      PT LONG TERM GOAL #3   Title Pt to demonstrate 5xSTS hands free from standard height surface to indicated improved BLE power.    Baseline 07/19/2023= 31.07 sec without UE Support   Time 8    Period Weeks    Status New    Target Date 09/09/23      PT LONG TERM GOAL #4   Title Pt to demonstrate performance of 2070ft AMB >0.35m/s without increased pain and without LOB at modI level or less to restore ability to access comunity distances for IADL.    Time 8    Period Weeks    Status New    Target Date 09/09/23                 Plan -     Clinical Impression Statement Pt with excellent motivation to participate in session. She was able to advance previous interventions with either increased reps or by performing in standing.  Pt with no pain throughout, BP was WNL. Pt will benefit from skilled PT services to address and improve her functional strength and mobility to assist in return to previous level of function.    Personal Factors and Comorbidities Age;Fitness;Behavior Pattern;Past/Current Experience    Examination-Activity Limitations Locomotion Level;Transfers;Lift;Stairs    Examination-Participation Restrictions Meal Prep;Cleaning;Community Activity;Driving;Interpersonal  Relationship;Yard Work    Stability/Clinical Decision Making Stable/Uncomplicated    Clinical Decision Making Moderate    Rehab Potential Good    PT Frequency 2x / week    PT Duration 8 weeks    PT Treatment/Interventions ADLs/Self Care Home Management;Gait training;Stair training;Functional mobility training;Neuromuscular re-education;Balance training;Therapeutic exercise;Therapeutic activities;Passive range of motion;Manual techniques    PT Next Visit Plan Check vitals,  continue with balance and therex vor home exercises.    PT Home Exercise Plan asked at eval to check and log BP twice daily,   Access Code: [email or web address] URL: [email or web address] Date: 07/21/2023 Prepared by: Grier Rocher  Exercises - Sit to Stand with Armchair  - 1 x daily - 5 x weekly - 2 sets - 6 reps - Standing Hip Abduction with Counter Support  - 1 x daily - 7 x weekly - 3 sets - 10 reps - Seated Hip Abduction with Resistance  - 1 x daily - 7 x weekly - 3 sets - 10 reps - Seated March with Resistance  - 1 x daily - 7 x weekly - 3 sets - 10 reps   Consulted and Agree with Plan of Care Patient;Family member/caregiver             Patient will benefit from skilled therapeutic intervention in order to improve the following deficits and impairments:   balance impairments, endurance deficits, reduced strength,   Visit Diagnosis: Muscle weakness (generalized)  Difficulty in walking, not elsewhere classified  Unsteadiness on feet     Problem List Patient Active Problem List   Diagnosis Date Noted   Obstructive nephropathy 01/13/2023   Acute pyelonephritis 01/13/2023   AKI (acute kidney injury) (HCC) 01/13/2023  Hypokalemia 01/13/2023   Diarrhea 01/13/2023   Thoracic aortic aneurysm without rupture (HCC) 11/09/2022   Hematuria 11/05/2022   Polyp of esophagus 11/02/2022   Iron deficiency anemia 07/28/2022   Positive colorectal cancer screening using Cologuard test    Abnormal ECG  03/31/2021   Acute non-recurrent sinusitis 11/30/2020   Acute otitis media 11/30/2020   Benign essential HTN 10/31/2018   Frequent PVCs 09/08/2017   A-fib (HCC) 08/27/2015   Clinical depression 08/27/2015   Blood pressure elevated 08/27/2015   Polypharmacy 08/27/2015   Esophagitis, reflux 08/27/2015   Fatigue 08/27/2015   Acid reflux 08/27/2015   HLD (hyperlipidemia) 08/27/2015   Adult hypothyroidism 08/27/2015   Adaptive colitis 08/27/2015   Malaise and fatigue 08/27/2015   Mild major depression (HCC) 08/27/2015   Polymyalgia rheumatica (HCC) 08/27/2015   Cranial arteritis (HCC) 08/27/2015   Avitaminosis D 08/27/2015    8:54 AM, 08/03/23   Baird Kay, PT 08/03/2023, 8:54 AM  8:54 AM, 08/03/23 Temple Pacini PT, DPT   Physical Therapist - [facility]  Outpatient Physical Therapy- Main Campus [phone]     [facility] Outpatient Rehabilitation at [facility] [address] [place], [place], [address] Phone: [phone]   Fax:  [phone]  Name: KIMALEE REASNER MRN: [id number] Date of Birth: [birth year]

## 2023-08-08 ENCOUNTER — Ambulatory Visit: Payer: Medicare Other

## 2023-08-10 ENCOUNTER — Ambulatory Visit: Payer: Medicare Other

## 2023-08-10 DIAGNOSIS — R262 Difficulty in walking, not elsewhere classified: Secondary | ICD-10-CM | POA: Diagnosis not present

## 2023-08-10 DIAGNOSIS — R2681 Unsteadiness on feet: Secondary | ICD-10-CM

## 2023-08-10 DIAGNOSIS — R296 Repeated falls: Secondary | ICD-10-CM

## 2023-08-10 DIAGNOSIS — M6281 Muscle weakness (generalized): Secondary | ICD-10-CM | POA: Diagnosis not present

## 2023-08-10 NOTE — Therapy (Signed)
[facility] Outpatient Rehabilitation at [facility] [address] [place], [place], [address] Phone: [phone]   Fax:  [phone]  Outpatient Physical Therapy Treatment  Patient Details  Name: Tara Marquez MRN: [id number] Date of Birth: 1941/09/28 Referring Provider (PT): Julieanne Manson MD (PCP)   Encounter Date: 08/10/2023   PT End of Session - 08/10/23 0929     Visit Number 8    Number of Visits 17    Date for PT Re-Evaluation 09/08/23    Authorization Type Medicare A&B primary with BCBS supplemental secondary    Authorization Time Period 07/14/23-09/08/23    Progress Note Due on Visit 10    PT Start Time 0930    PT Stop Time 1013    PT Time Calculation (min) 43 min    Equipment Utilized During Treatment Gait belt    Activity Tolerance Patient tolerated treatment well;No increased pain    Behavior During Therapy WFL for tasks assessed/performed               Past Medical History:  Diagnosis Date   Acute pyelonephritis    Adaptive colitis    Anemia    Aortic atherosclerosis (HCC)    Atrial fibrillation and flutter (HCC)    a.) CHA2DS2VASc = 5 (age x 2, sex, HTN, vascular disease history);  b.) rate/rhythm maintained on oral amiodarone + metoprolol succinate; chronically anticoagulated with dose reduced apixaban   CAD (coronary artery disease)    Depression    Descending thoracic aortic aneurysm (HCC)    a.) CT chest 09/29/2022: 4.9 cm; b.) CT abd 01/13/2023: 4.9 cm   Frequent PVCs    GERD (gastroesophageal reflux disease)    Hepatic steatosis    History of 2019 novel coronavirus disease (COVID-19) 12/04/2021   Hyperlipidemia    Hypertension    Hypokalemia    Hypothyroidism    Long term current use of amiodarone    Long term current use of anticoagulant    a.) dose reduced apixaban   Nephrolithiasis    Osteopenia    Polymyalgia rheumatica (HCC)    Skin cancer    Splenomegaly    Temporal arteritis (HCC)    Vitamin D deficiency      Past Surgical History:  Procedure Laterality Date   ABDOMINAL HYSTERECTOMY  03/1971   Status Post Hysterectomy   CATARACT EXTRACTION Left    COLONOSCOPY     2010   COLONOSCOPY WITH PROPOFOL N/A 01/12/2022   Procedure: COLONOSCOPY WITH PROPOFOL;  Surgeon: Midge Minium, MD;  Location: [facility] ENDOSCOPY;  Service: Endoscopy;  Laterality: N/A;   CYSTOSCOPY WITH RETROGRADE PYELOGRAM, URETEROSCOPY AND STENT PLACEMENT Left 01/13/2023   Procedure: CYSTOSCOPY WITH RETROGRADE PYELOGRAM, URETEROSCOPY AND STENT PLACEMENT;  Surgeon: Riki Altes, MD;  Location: [facility] ORS;  Service: Urology;  Laterality: Left;   CYSTOSCOPY/URETEROSCOPY/HOLMIUM LASER/STENT PLACEMENT Left 02/08/2023   Procedure: CYSTOSCOPY/URETEROSCOPY/HOLMIUM LASER/STENT EXCHANGE;  Surgeon: Riki Altes, MD;  Location: [facility] ORS;  Service: Urology;  Laterality: Left;   ESOPHAGOGASTRODUODENOSCOPY N/A 11/02/2022   Procedure: ESOPHAGOGASTRODUODENOSCOPY (EGD);  Surgeon: Midge Minium, MD;  Location: [facility] ENDOSCOPY;  Service: Endoscopy;  Laterality: N/A;   EYE SURGERY     GIVENS CAPSULE STUDY N/A 12/22/2022   Procedure: GIVENS CAPSULE STUDY;  Surgeon: Midge Minium, MD;  Location[facility] ENDOSCOPY;  Service: Endoscopy;  Laterality: N/A;   TONSILLECTOMY     TONSILLECTOMY AND ADENOIDECTOMY  1951   WISDOM TOOTH EXTRACTION  1970         Subjective Assessment  Subjective Pt reports some continued dizziness but feeling okay today.    Pertinent History Tara Marquez is an 82yoF who is referred by PCP due to gradual progressive weakness since hospital admission in February 2024. Pt presents to PT eval with husband. Pt reports being in hospital for ~ a week in Feb 2024 with urinary obstruction, AKI, felt week initially but declined HHPT; pt/husband say progressively more weak over subsequent months, more difficulty with walking, husband has now asked her to use a cane. Pt reports 5 falls, 4 of which were outside on natural surface 5th trying to  clear a threshold (posterior fall buttock, back, hit head). Does get dizzy, but not frequently, not related to falls often.    How long can you sit comfortably? Not a limitation    How long can you stand comfortably? Not a limitation    How long can you walk comfortably? Mostly walks household distances, but can tolerate occasional store trips with buggy support.    Currently in Pain? No/denies               Objective measurements completed on examination: See above findings.    TODAY'S TREATMENT: 08/10/2023   TE:  BP: 142/72 mmHg (84) HR 66 bpm  Standing step tap onto airex pad with UE support x 10 each LE Standing step tap onto airex pad without UE support x 10 each LE Step up onto airex pad with UE Support x 10 reps each LE Step up onto airex pad without UE support x 10 reps each LE STS 2x10. Instruction in technique/'nose over toes," rates medium Standing calf raise 20x BLE Seated DF x15 with 3 sec hold/rep with RTB.   Side step orange hurdles x 10 reps each direction.       PT Education -     Education Details Pt educated throughout session about proper posture and technique with exercises. Improved exercise technique, movement at target joints, use of target muscles after min to mod verbal, visual, tactile cues.                           PT Short Term Goals -        PT SHORT TERM GOAL #1   Title compliant with balance HEP    Time 4    Period Weeks    Status New    Target Date 08/12/23      PT SHORT TERM GOAL #2   Title Improved overground gait speed over 545ft to improve ability to access limited community distances safely.    Baseline eval: 325ft at 0.78m/s    Time 4    Period Weeks    Status New    Target Date 08/12/23               PT Long Term Goals -       PT LONG TERM GOAL #1   Title FOTO score improvement >12 points to indicate improve confidence in balance.    Baseline 07/19/2023=43   Time 8    Period Weeks    Status New     Target Date 09/09/23      PT LONG TERM GOAL #2   Title Improvement in standardized balance assessment by MCID or greater.    Baseline 07/19/2023= BERG- 35/56   Time 8    Period Weeks    Status New    Target Date 09/09/23      PT LONG  TERM GOAL #3   Title Pt to demonstrate 5xSTS hands free from standard height surface to indicated improved BLE power.    Baseline 07/19/2023= 31.07 sec without UE Support   Time 8    Period Weeks    Status New    Target Date 09/09/23      PT LONG TERM GOAL #4   Title Pt to demonstrate performance of 208ft AMB >0.2m/s without increased pain and without LOB at modI level or less to restore ability to access comunity distances for IADL.    Time 8    Period Weeks    Status New    Target Date 09/09/23                 Plan -     Clinical Impression Statement Patient presented with good motivation for today's focus on LE strengthening in mostly standing position. She responded well- able to participate in more standing exercises overall with appropriate rest breaks. She was mostly limited by weakness and fatigue and will benefit from more standing activities to improve her overall standing endurance. Pt will benefit from skilled PT services to address and improve her functional strength and mobility to assist in return to previous level of function.    Personal Factors and Comorbidities Age;Fitness;Behavior Pattern;Past/Current Experience    Examination-Activity Limitations Locomotion Level;Transfers;Lift;Stairs    Examination-Participation Restrictions Meal Prep;Cleaning;Community Activity;Driving;Interpersonal Relationship;Yard Work    Stability/Clinical Decision Making Stable/Uncomplicated    Clinical Decision Making Moderate    Rehab Potential Good    PT Frequency 2x / week    PT Duration 8 weeks    PT Treatment/Interventions ADLs/Self Care Home Management;Gait training;Stair training;Functional mobility training;Neuromuscular  re-education;Balance training;Therapeutic exercise;Therapeutic activities;Passive range of motion;Manual techniques    PT Next Visit Plan Check vitals,  continue with balance and therex vor home exercises.    PT Home Exercise Plan asked at eval to check and log BP twice daily,   Access Code: [email or web address] URL: [email or web address] Date: 07/21/2023 Prepared by: Grier Rocher  Exercises - Sit to Stand with Armchair  - 1 x daily - 5 x weekly - 2 sets - 6 reps - Standing Hip Abduction with Counter Support  - 1 x daily - 7 x weekly - 3 sets - 10 reps - Seated Hip Abduction with Resistance  - 1 x daily - 7 x weekly - 3 sets - 10 reps - Seated March with Resistance  - 1 x daily - 7 x weekly - 3 sets - 10 reps   Consulted and Agree with Plan of Care Patient;Family member/caregiver             Patient will benefit from skilled therapeutic intervention in order to improve the following deficits and impairments:   balance impairments, endurance deficits, reduced strength,   Visit Diagnosis: Muscle weakness (generalized)  Difficulty in walking, not elsewhere classified  Unsteadiness on feet  Repeated falls     Problem List Patient Active Problem List   Diagnosis Date Noted   Obstructive nephropathy 01/13/2023   Acute pyelonephritis 01/13/2023   AKI (acute kidney injury) (HCC) 01/13/2023   Hypokalemia 01/13/2023   Diarrhea 01/13/2023   Thoracic aortic aneurysm without rupture (HCC) 11/09/2022   Hematuria 11/05/2022   Polyp of esophagus 11/02/2022   Iron deficiency anemia 07/28/2022   Positive colorectal cancer screening using Cologuard test    Abnormal ECG 03/31/2021   Acute non-recurrent sinusitis 11/30/2020   Acute otitis media 11/30/2020   Benign essential HTN 10/31/2018  Frequent PVCs 09/08/2017   A-fib (HCC) 08/27/2015   Clinical depression 08/27/2015   Blood pressure elevated 08/27/2015   Polypharmacy 08/27/2015   Esophagitis, reflux 08/27/2015   Fatigue  08/27/2015   Acid reflux 08/27/2015   HLD (hyperlipidemia) 08/27/2015   Adult hypothyroidism 08/27/2015   Adaptive colitis 08/27/2015   Malaise and fatigue 08/27/2015   Mild major depression (HCC) 08/27/2015   Polymyalgia rheumatica (HCC) 08/27/2015   Cranial arteritis (HCC) 08/27/2015   Avitaminosis D 08/27/2015    9:12 AM, 08/11/23   Lenda Kelp, PT 08/11/2023, 9:12 AM  9:12 AM, 08/11/23   Physical Therapist - [facility]  Outpatient Physical Therapy- Main Campus [phone]     [facility] Outpatient Rehabilitation at [facility] [address] [place], [place], [address] Phone: [phone]   Fax:  [phone]  Name: Tara Marquez MRN: [id number] Date of Birth: 03-18-1941

## 2023-08-11 ENCOUNTER — Other Ambulatory Visit: Payer: Self-pay | Admitting: *Deleted

## 2023-08-11 DIAGNOSIS — E782 Mixed hyperlipidemia: Secondary | ICD-10-CM | POA: Diagnosis not present

## 2023-08-11 DIAGNOSIS — I7123 Aneurysm of the descending thoracic aorta, without rupture: Secondary | ICD-10-CM | POA: Diagnosis not present

## 2023-08-11 DIAGNOSIS — I480 Paroxysmal atrial fibrillation: Secondary | ICD-10-CM | POA: Diagnosis not present

## 2023-08-11 DIAGNOSIS — I10 Essential (primary) hypertension: Secondary | ICD-10-CM | POA: Diagnosis not present

## 2023-08-11 DIAGNOSIS — N201 Calculus of ureter: Secondary | ICD-10-CM

## 2023-08-11 DIAGNOSIS — Z79899 Other long term (current) drug therapy: Secondary | ICD-10-CM | POA: Diagnosis not present

## 2023-08-11 DIAGNOSIS — R42 Dizziness and giddiness: Secondary | ICD-10-CM | POA: Diagnosis not present

## 2023-08-11 DIAGNOSIS — R001 Bradycardia, unspecified: Secondary | ICD-10-CM | POA: Diagnosis not present

## 2023-08-12 ENCOUNTER — Telehealth (INDEPENDENT_AMBULATORY_CARE_PROVIDER_SITE_OTHER): Payer: Self-pay

## 2023-08-12 NOTE — Telephone Encounter (Signed)
I spoke with pt and let her know she can call [phone] to get her CT scheduled and then call our office back to schedule an appt with the dr to get the results.  She states verbal understanding

## 2023-08-15 ENCOUNTER — Ambulatory Visit: Payer: Medicare Other | Admitting: Physical Therapy

## 2023-08-15 VITALS — BP 195/67 | HR 60

## 2023-08-15 DIAGNOSIS — R296 Repeated falls: Secondary | ICD-10-CM | POA: Diagnosis not present

## 2023-08-15 DIAGNOSIS — R2681 Unsteadiness on feet: Secondary | ICD-10-CM

## 2023-08-15 DIAGNOSIS — R262 Difficulty in walking, not elsewhere classified: Secondary | ICD-10-CM | POA: Diagnosis not present

## 2023-08-15 DIAGNOSIS — M6281 Muscle weakness (generalized): Secondary | ICD-10-CM

## 2023-08-15 NOTE — Therapy (Unsigned)
[facility] Outpatient Rehabilitation at [facility] [address] [place], [place], [address] Phone: [phone]   Fax:  [phone]  Outpatient Physical Therapy Treatment  Patient Details  Name: Tara Marquez MRN: [id number] Date of Birth: 05-03-1941 Referring Provider (PT): Julieanne Manson MD (PCP)   Encounter Date: 08/15/2023   PT End of Session - 08/15/23 1539     Visit Number 9    Number of Visits 17    Date for PT Re-Evaluation 09/08/23    Authorization Type Medicare A&B primary with BCBS supplemental secondary    Authorization Time Period 07/14/23-09/08/23    Progress Note Due on Visit 10    PT Start Time 1536    PT Stop Time 1615    PT Time Calculation (min) 39 min    Equipment Utilized During Treatment Gait belt    Activity Tolerance Patient tolerated treatment well;No increased pain    Behavior During Therapy WFL for tasks assessed/performed               Past Medical History:  Diagnosis Date   Acute pyelonephritis    Adaptive colitis    Anemia    Aortic atherosclerosis (HCC)    Atrial fibrillation and flutter (HCC)    a.) CHA2DS2VASc = 5 (age x 2, sex, HTN, vascular disease history);  b.) rate/rhythm maintained on oral amiodarone + metoprolol succinate; chronically anticoagulated with dose reduced apixaban   CAD (coronary artery disease)    Depression    Descending thoracic aortic aneurysm (HCC)    a.) CT chest 09/29/2022: 4.9 cm; b.) CT abd 01/13/2023: 4.9 cm   Frequent PVCs    GERD (gastroesophageal reflux disease)    Hepatic steatosis    History of 2019 novel coronavirus disease (COVID-19) 12/04/2021   Hyperlipidemia    Hypertension    Hypokalemia    Hypothyroidism    Long term current use of amiodarone    Long term current use of anticoagulant    a.) dose reduced apixaban   Nephrolithiasis    Osteopenia    Polymyalgia rheumatica (HCC)    Skin cancer    Splenomegaly    Temporal arteritis (HCC)    Vitamin D deficiency      Past Surgical History:  Procedure Laterality Date   ABDOMINAL HYSTERECTOMY  03/1971   Status Post Hysterectomy   CATARACT EXTRACTION Left    COLONOSCOPY     2010   COLONOSCOPY WITH PROPOFOL N/A 01/12/2022   Procedure: COLONOSCOPY WITH PROPOFOL;  Surgeon: Midge Minium, MD;  Location: [facility] ENDOSCOPY;  Service: Endoscopy;  Laterality: N/A;   CYSTOSCOPY WITH RETROGRADE PYELOGRAM, URETEROSCOPY AND STENT PLACEMENT Left 01/13/2023   Procedure: CYSTOSCOPY WITH RETROGRADE PYELOGRAM, URETEROSCOPY AND STENT PLACEMENT;  Surgeon: Riki Altes, MD;  Location: [facility] ORS;  Service: Urology;  Laterality: Left;   CYSTOSCOPY/URETEROSCOPY/HOLMIUM LASER/STENT PLACEMENT Left 02/08/2023   Procedure: CYSTOSCOPY/URETEROSCOPY/HOLMIUM LASER/STENT EXCHANGE;  Surgeon: Riki Altes, MD;  Location: [facility] ORS;  Service: Urology;  Laterality: Left;   ESOPHAGOGASTRODUODENOSCOPY N/A 11/02/2022   Procedure: ESOPHAGOGASTRODUODENOSCOPY (EGD);  Surgeon: Midge Minium, MD;  Location: [facility] ENDOSCOPY;  Service: Endoscopy;  Laterality: N/A;   EYE SURGERY     GIVENS CAPSULE STUDY N/A 12/22/2022   Procedure: GIVENS CAPSULE STUDY;  Surgeon: Midge Minium, MD;  Location: [facility] ENDOSCOPY;  Service: Endoscopy;  Laterality: N/A;   TONSILLECTOMY     TONSILLECTOMY AND ADENOIDECTOMY  1951   WISDOM TOOTH EXTRACTION  1970         Subjective Assessment  Subjective Pt reports some continued dizziness but feeling okay today.    Pertinent History Tara Marquez is an 82yoF who is referred by PCP due to gradual progressive weakness since hospital admission in February 2024. Pt presents to PT eval with husband. Pt reports being in hospital for ~ a week in Feb 2024 with urinary obstruction, AKI, felt week initially but declined HHPT; pt/husband say progressively more weak over subsequent months, more difficulty with walking, husband has now asked her to use a cane. Pt reports 5 falls, 4 of which were outside on natural surface 5th trying to  clear a threshold (posterior fall buttock, back, hit head). Does get dizzy, but not frequently, not related to falls often.    How long can you sit comfortably? Not a limitation    How long can you stand comfortably? Not a limitation    How long can you walk comfortably? Mostly walks household distances, but can tolerate occasional store trips with buggy support.    Currently in Pain? No/denies               Objective measurements completed on examination: See above findings.    TODAY'S TREATMENT: 08/10/2023   TA:   Pt reports feelings a little dizzy at start of PT session:   BP taken  Sitting: 165/70 HR 62 Standing 0 min: 177/64 HR 66  Standing 1 min: 189/65 HR 65  Sitting: 183/70 HR 61 Sitting 1 min : 169/72 HR 60  Gait with SPC: then performed in standing  186/73 HR 67  Sitting 1 min: 195/67 HR 62  Sitting 3 minutes: 185/63 HR HR 69  Sitting 7 minutes: 184/66 HR 62  PT d/c'ed PT treatment at this time due to elevated BP. Pt remained asymptomatic throughout     PT Education -     Education Details Pt educated throughout session about proper posture and technique with exercises. Improved exercise technique, movement at target joints, use of target muscles after min to mod verbal, visual, tactile cues.                           PT Short Term Goals -        PT SHORT TERM GOAL #1   Title compliant with balance HEP    Time 4    Period Weeks    Status New    Target Date 08/12/23      PT SHORT TERM GOAL #2   Title Improved overground gait speed over 540ft to improve ability to access limited community distances safely.    Baseline eval: 317ft at 0.55m/s    Time 4    Period Weeks    Status New    Target Date 08/12/23               PT Long Term Goals -       PT LONG TERM GOAL #1   Title FOTO score improvement >12 points to indicate improve confidence in balance.    Baseline 07/19/2023=43   Time 8    Period Weeks    Status New    Target Date  09/09/23      PT LONG TERM GOAL #2   Title Improvement in standardized balance assessment by MCID or greater.    Baseline 07/19/2023= BERG- 35/56   Time 8    Period Weeks    Status New    Target Date 09/09/23      PT LONG  TERM GOAL #3   Title Pt to demonstrate 5xSTS hands free from standard height surface to indicated improved BLE power.    Baseline 07/19/2023= 31.07 sec without UE Support   Time 8    Period Weeks    Status New    Target Date 09/09/23      PT LONG TERM GOAL #4   Title Pt to demonstrate performance of 2043ft AMB >0.44m/s without increased pain and without LOB at modI level or less to restore ability to access comunity distances for IADL.    Time 8    Period Weeks    Status New    Target Date 09/09/23                 Plan -     Clinical Impression Statement Patient presented with good motivation for today's focus on LE strengthening in mostly standing position. She responded well- able to participate in more standing exercises overall with appropriate rest breaks. She was mostly limited by weakness and fatigue and will benefit from more standing activities to improve her overall standing endurance. Pt will benefit from skilled PT services to address and improve her functional strength and mobility to assist in return to previous level of function.    Personal Factors and Comorbidities Age;Fitness;Behavior Pattern;Past/Current Experience    Examination-Activity Limitations Locomotion Level;Transfers;Lift;Stairs    Examination-Participation Restrictions Meal Prep;Cleaning;Community Activity;Driving;Interpersonal Relationship;Yard Work    Stability/Clinical Decision Making Stable/Uncomplicated    Clinical Decision Making Moderate    Rehab Potential Good    PT Frequency 2x / week    PT Duration 8 weeks    PT Treatment/Interventions ADLs/Self Care Home Management;Gait training;Stair training;Functional mobility training;Neuromuscular re-education;Balance  training;Therapeutic exercise;Therapeutic activities;Passive range of motion;Manual techniques    PT Next Visit Plan Check vitals,  continue with balance and therex vor home exercises.    PT Home Exercise Plan asked at eval to check and log BP twice daily,   Access Code: [email or web address] URL: [email or web address] Date: 07/21/2023 Prepared by: Grier Rocher  Exercises - Sit to Stand with Armchair  - 1 x daily - 5 x weekly - 2 sets - 6 reps - Standing Hip Abduction with Counter Support  - 1 x daily - 7 x weekly - 3 sets - 10 reps - Seated Hip Abduction with Resistance  - 1 x daily - 7 x weekly - 3 sets - 10 reps - Seated March with Resistance  - 1 x daily - 7 x weekly - 3 sets - 10 reps   Consulted and Agree with Plan of Care Patient;Family member/caregiver             Patient will benefit from skilled therapeutic intervention in order to improve the following deficits and impairments:   balance impairments, endurance deficits, reduced strength,   Visit Diagnosis: Muscle weakness (generalized)  Difficulty in walking, not elsewhere classified  Unsteadiness on feet  Repeated falls     Problem List Patient Active Problem List   Diagnosis Date Noted   Obstructive nephropathy 01/13/2023   Acute pyelonephritis 01/13/2023   AKI (acute kidney injury) (HCC) 01/13/2023   Hypokalemia 01/13/2023   Diarrhea 01/13/2023   Thoracic aortic aneurysm without rupture (HCC) 11/09/2022   Hematuria 11/05/2022   Polyp of esophagus 11/02/2022   Iron deficiency anemia 07/28/2022   Positive colorectal cancer screening using Cologuard test    Abnormal ECG 03/31/2021   Acute non-recurrent sinusitis 11/30/2020   Acute otitis media 11/30/2020   Benign essential HTN 10/31/2018  Frequent PVCs 09/08/2017   A-fib (HCC) 08/27/2015   Clinical depression 08/27/2015   Blood pressure elevated 08/27/2015   Polypharmacy 08/27/2015   Esophagitis, reflux 08/27/2015   Fatigue 08/27/2015   Acid  reflux 08/27/2015   HLD (hyperlipidemia) 08/27/2015   Adult hypothyroidism 08/27/2015   Adaptive colitis 08/27/2015   Malaise and fatigue 08/27/2015   Mild major depression (HCC) 08/27/2015   Polymyalgia rheumatica (HCC) 08/27/2015   Cranial arteritis (HCC) 08/27/2015   Avitaminosis D 08/27/2015    3:40 PM, 08/15/23  Grier Rocher PT, DPT  Physical Therapist - [place]  [facility]  3:40 PM 08/15/23

## 2023-08-17 ENCOUNTER — Ambulatory Visit
Admission: RE | Admit: 2023-08-17 | Discharge: 2023-08-17 | Disposition: A | Discharge status: Home / Self Care | Payer: Medicare Other | Source: Ambulatory Visit | Attending: Vascular Surgery | Admitting: Vascular Surgery

## 2023-08-17 DIAGNOSIS — I2510 Atherosclerotic heart disease of native coronary artery without angina pectoris: Secondary | ICD-10-CM | POA: Diagnosis not present

## 2023-08-17 DIAGNOSIS — I7123 Aneurysm of the descending thoracic aorta, without rupture: Secondary | ICD-10-CM | POA: Insufficient documentation

## 2023-08-17 DIAGNOSIS — Z01818 Encounter for other preprocedural examination: Secondary | ICD-10-CM | POA: Diagnosis not present

## 2023-08-17 MED ORDER — IOHEXOL 350 MG/ML SOLN
60.0000 mL | Freq: Once | INTRAVENOUS | Status: AC | PRN
  Administered 2023-08-17: 60 mL via INTRAVENOUS

## 2023-08-22 ENCOUNTER — Ambulatory Visit: Payer: Medicare Other

## 2023-08-22 VITALS — BP 140/78 | HR 71

## 2023-08-22 DIAGNOSIS — R296 Repeated falls: Secondary | ICD-10-CM

## 2023-08-22 DIAGNOSIS — R2681 Unsteadiness on feet: Secondary | ICD-10-CM

## 2023-08-22 DIAGNOSIS — R262 Difficulty in walking, not elsewhere classified: Secondary | ICD-10-CM

## 2023-08-22 DIAGNOSIS — M6281 Muscle weakness (generalized): Secondary | ICD-10-CM

## 2023-08-22 DIAGNOSIS — R001 Bradycardia, unspecified: Secondary | ICD-10-CM | POA: Diagnosis not present

## 2023-08-22 NOTE — Therapy (Signed)
[facility] Outpatient Rehabilitation at [facility] [address] [place], [place], [address] Phone: [phone]   Fax:  [phone]  Outpatient Physical Therapy Treatment/Reassessmet (progress note) Dates 07/14/23-08/22/23  Patient Details  Name: Tara Marquez MRN: [id number] Date of Birth: [birth year] Referring Provider (PT): Julieanne Manson MD (PCP)   Encounter Date: 08/22/2023   PT End of Session - 08/22/23 1019     Visit Number 10    Number of Visits 17    Date for PT Re-Evaluation 09/08/23    Authorization Type Medicare A&B primary with BCBS supplemental secondary    Authorization Time Period 07/14/23-09/08/23    Progress Note Due on Visit 10    PT Start Time 1015    PT Stop Time 1055    PT Time Calculation (min) 40 min    Equipment Utilized During Treatment Gait belt    Activity Tolerance Patient tolerated treatment well;No increased pain    Behavior During Therapy WFL for tasks assessed/performed               Past Medical History:  Diagnosis Date   Acute pyelonephritis    Adaptive colitis    Anemia    Aortic atherosclerosis (HCC)    Atrial fibrillation and flutter (HCC)    a.) CHA2DS2VASc = 5 (age x 2, sex, HTN, vascular disease history);  b.) rate/rhythm maintained on oral amiodarone + metoprolol succinate; chronically anticoagulated with dose reduced apixaban   CAD (coronary artery disease)    Depression    Descending thoracic aortic aneurysm (HCC)    a.) CT chest 09/29/2022: 4.9 cm; b.) CT abd 01/13/2023: 4.9 cm   Frequent PVCs    GERD (gastroesophageal reflux disease)    Hepatic steatosis    History of 2019 novel coronavirus disease (COVID-19) 12/04/2021   Hyperlipidemia    Hypertension    Hypokalemia    Hypothyroidism    Long term current use of amiodarone    Long term current use of anticoagulant    a.) dose reduced apixaban   Nephrolithiasis    Osteopenia    Polymyalgia rheumatica (HCC)    Skin cancer    Splenomegaly     Temporal arteritis (HCC)    Vitamin D deficiency     Past Surgical History:  Procedure Laterality Date   ABDOMINAL HYSTERECTOMY  03/1971   Status Post Hysterectomy   CATARACT EXTRACTION Left    COLONOSCOPY     2010   COLONOSCOPY WITH PROPOFOL N/A 01/12/2022   Procedure: COLONOSCOPY WITH PROPOFOL;  Surgeon: Midge Minium, MD;  Location: [facility] ENDOSCOPY;  Service: Endoscopy;  Laterality: N/A;   CYSTOSCOPY WITH RETROGRADE PYELOGRAM, URETEROSCOPY AND STENT PLACEMENT Left 01/13/2023   Procedure: CYSTOSCOPY WITH RETROGRADE PYELOGRAM, URETEROSCOPY AND STENT PLACEMENT;  Surgeon: Riki Altes, MD;  Location: [facility] ORS;  Service: Urology;  Laterality: Left;   CYSTOSCOPY/URETEROSCOPY/HOLMIUM LASER/STENT PLACEMENT Left 02/08/2023   Procedure: CYSTOSCOPY/URETEROSCOPY/HOLMIUM LASER/STENT EXCHANGE;  Surgeon: Riki Altes, MD;  Location: [facility] ORS;  Service: Urology;  Laterality: Left;   ESOPHAGOGASTRODUODENOSCOPY N/A 11/02/2022   Procedure: ESOPHAGOGASTRODUODENOSCOPY (EGD);  Surgeon: Midge Minium, MD;  Location: [facility] ENDOSCOPY;  Service: Endoscopy;  Laterality: N/A;   EYE SURGERY     GIVENS CAPSULE STUDY N/A 12/22/2022   Procedure: GIVENS CAPSULE STUDY;  Surgeon: Midge Minium, MD;  Location: [facility] ENDOSCOPY;  Service: Endoscopy;  Laterality: N/A;   TONSILLECTOMY     TONSILLECTOMY AND ADENOIDECTOMY  1951   WISDOM TOOTH EXTRACTION  1970       Subjective  Assessment     Subjective Pt doing well today, she reports some dizziness episodes while turning 1-2 episodes that last less than 60sec. Pt feels PT has been helpful.    Pertinent History Caree Fakes is an 82yoF who is referred by PCP due to gradual progressive weakness since hospital admission in February 2024. Pt presents to PT eval with husband. Pt reports being in hospital for ~ a week in Feb 2024 with urinary obstruction, AKI, felt week initially but declined HHPT; pt/husband say progressively more weak over subsequent months, more difficulty with  walking, husband has now asked her to use a cane. Pt reports 5 falls, 4 of which were outside on natural surface 5th trying to clear a threshold (posterior fall buttock, back, hit head). Does get dizzy, but not frequently, not related to falls often.    How long can you sit comfortably? Not a limitation    How long can you stand comfortably? Not a limitation    How long can you walk comfortably? Mostly walks household distances, but can tolerate occasional store trips with buggy support.    Currently in Pain? No/denies               Objective measurements completed on examination: See above findings.    TODAY'S TREATMENT: 08/22/23 -Reassessment, see goals  Start vitals  140/6mmHg 71 bpm  seated  FOTO: 51 today  BERG 35 to 44 (least improvement with step taps) 5xSTS: 19sec AMB test, no device: 662ft 4:02 0.64m/s     PT Education -     Education Details Transition to longer walking outside of home.                          PT Short Term Goals -        PT SHORT TERM GOAL #1   Title compliant with balance HEP    Time 4    Period Weeks    Status Achieved    Target Date 08/12/23      PT SHORT TERM GOAL #2   Title Improved overground gait speed over 553ft to improve ability to access limited community distances safely.    Baseline eval: 371ft at 0.51m/s; 6108ft s device 0.44m/s.   Time 4    Period Weeks    Status Achieved     Target Date 08/12/23               PT Long Term Goals -       PT LONG TERM GOAL #1   Title FOTO score improvement >12 points to indicate improve confidence in balance.    Baseline 07/19/2023=43; 08/22/23: 51    Time 8    Period Weeks    Status On going     Target Date 09/09/23      PT LONG TERM GOAL #2   Title Improvement in standardized balance assessment by MCID or greater.    Baseline 07/19/2023= BERG- 35/56; 08/22/23: 44   Time 8    Period Weeks    Status Achieved     Target Date 09/09/23      PT LONG TERM GOAL #3    Title Pt to demonstrate 5xSTS hands free from standard height surface to indicated improved BLE power.    Baseline 07/19/2023= 31.07 sec without UE Support; 08/22/23: 19.15sec (hands on thighs)    Time 8    Period Weeks    Status Progressing    Target Date 09/09/23  PT LONG TERM GOAL #4   Title Pt to demonstrate performance of 2026ft AMB >0.35m/s without increased pain and without LOB at modI level or less to restore ability to access comunity distances for IADL.    Baseline 08/22/23: 671ft in 4:02 (0.21m/s)    Time 8    Period Weeks    Status Ongong    Target Date 09/09/23                 Plan -     Clinical Impression Statement BP controlled on arrival. Greater than anticipated progress toward goals seen in all areas of reassessment this date. Asked pt to start out of hosue walking 2-3x weekly 10-15 mi duration. Pt will benefit from skilled PT services to address and improve her functional strength and mobility to assist in return to previous level of function.    Personal Factors and Comorbidities Age;Fitness;Behavior Pattern;Past/Current Experience    Examination-Activity Limitations Locomotion Level;Transfers;Lift;Stairs    Examination-Participation Restrictions Meal Prep;Cleaning;Community Activity;Driving;Interpersonal Relationship;Yard Work    Stability/Clinical Decision Making Stable/Uncomplicated    Clinical Decision Making Moderate    Rehab Potential Good    PT Frequency 2x / week    PT Duration 8 weeks    PT Treatment/Interventions ADLs/Self Care Home Management;Gait training;Stair training;Functional mobility training;Neuromuscular re-education;Balance training;Therapeutic exercise;Therapeutic activities;Passive range of motion;Manual techniques    PT Next Visit Plan Check vitals,  continue with balance and therex vor home exercises.        PT Home Exercise Plan asked at eval to check and log BP twice daily,   Access Code: [email or web address] URL:  [email or web address] Date: 07/21/2023 Prepared by: Grier Rocher  Exercises - Sit to Stand with Armchair  - 1 x daily - 5 x weekly - 2 sets - 6 reps - Standing Hip Abduction with Counter Support  - 1 x daily - 7 x weekly - 3 sets - 10 reps - Seated Hip Abduction with Resistance  - 1 x daily - 7 x weekly - 3 sets - 10 reps - Seated March with Resistance  - 1 x daily - 7 x weekly - 3 sets - 10 reps   Consulted and Agree with Plan of Care Patient;Family member/caregiver             Patient will benefit from skilled therapeutic intervention in order to improve the following deficits and impairments:   balance impairments, endurance deficits, reduced strength,   Visit Diagnosis: Muscle weakness (generalized)  Difficulty in walking, not elsewhere classified  Unsteadiness on feet  Repeated falls     Problem List Patient Active Problem List   Diagnosis Date Noted   Obstructive nephropathy 01/13/2023   Acute pyelonephritis 01/13/2023   AKI (acute kidney injury) (HCC) 01/13/2023   Hypokalemia 01/13/2023   Diarrhea 01/13/2023   Thoracic aortic aneurysm without rupture (HCC) 11/09/2022   Hematuria 11/05/2022   Polyp of esophagus 11/02/2022   Iron deficiency anemia 07/28/2022   Positive colorectal cancer screening using Cologuard test    Abnormal ECG 03/31/2021   Acute non-recurrent sinusitis 11/30/2020   Acute otitis media 11/30/2020   Benign essential HTN 10/31/2018   Frequent PVCs 09/08/2017   A-fib (HCC) 08/27/2015   Clinical depression 08/27/2015   Blood pressure elevated 08/27/2015   Polypharmacy 08/27/2015   Esophagitis, reflux 08/27/2015   Fatigue 08/27/2015   Acid reflux 08/27/2015   HLD (hyperlipidemia) 08/27/2015   Adult hypothyroidism 08/27/2015   Adaptive colitis 08/27/2015   Malaise and fatigue 08/27/2015   Mild major  depression (HCC) 08/27/2015   Polymyalgia rheumatica (HCC) 08/27/2015   Cranial arteritis (HCC) 08/27/2015   Avitaminosis  D 08/27/2015    10:23 AM, 08/22/23 Rosamaria Lints, PT, DPT Physical Therapist -  [facility]  Outpatient Physical Therapy- [facility] [phone]

## 2023-08-23 ENCOUNTER — Ambulatory Visit (INDEPENDENT_AMBULATORY_CARE_PROVIDER_SITE_OTHER): Payer: Medicare Other | Admitting: Vascular Surgery

## 2023-08-25 ENCOUNTER — Ambulatory Visit: Payer: Medicare Other

## 2023-08-25 DIAGNOSIS — R262 Difficulty in walking, not elsewhere classified: Secondary | ICD-10-CM

## 2023-08-25 DIAGNOSIS — M6281 Muscle weakness (generalized): Secondary | ICD-10-CM | POA: Diagnosis not present

## 2023-08-25 DIAGNOSIS — R296 Repeated falls: Secondary | ICD-10-CM | POA: Diagnosis not present

## 2023-08-25 DIAGNOSIS — R2681 Unsteadiness on feet: Secondary | ICD-10-CM | POA: Diagnosis not present

## 2023-08-25 NOTE — Therapy (Signed)
[facility] at [facility] [address] [place], [place], [address] Phone: [phone]   Fax:  [phone]  Outpatient Physical Therapy Treatment   Patient Details  Name: Tara Marquez MRN: [id number] Date of Birth: Apr 02, 1941 Referring Provider (PT): Julieanne Manson MD (PCP)   Encounter Date: 08/25/2023   PT End of Session - 08/25/23 1331     Visit Number 11    Number of Visits 17    Date for PT Re-Evaluation 09/08/23    Authorization Type Medicare A&B primary with BCBS supplemental secondary    Authorization Time Period 07/14/23-09/08/23    Progress Note Due on Visit 10    PT Start Time 1315    PT Stop Time 1358    PT Time Calculation (min) 43 min    Equipment Utilized During Treatment Gait belt    Activity Tolerance Patient tolerated treatment well;No increased pain    Behavior During Therapy WFL for tasks assessed/performed                Past Medical History:  Diagnosis Date   Acute pyelonephritis    Adaptive colitis    Anemia    Aortic atherosclerosis (HCC)    Atrial fibrillation and flutter (HCC)    a.) CHA2DS2VASc = 5 (age x 2, sex, HTN, vascular disease history);  b.) rate/rhythm maintained on oral amiodarone + metoprolol succinate; chronically anticoagulated with dose reduced apixaban   CAD (coronary artery disease)    Depression    Descending thoracic aortic aneurysm (HCC)    a.) CT chest 09/29/2022: 4.9 cm; b.) CT abd 01/13/2023: 4.9 cm   Frequent PVCs    GERD (gastroesophageal reflux disease)    Hepatic steatosis    History of 2019 novel coronavirus disease (COVID-19) 12/04/2021   Hyperlipidemia    Hypertension    Hypokalemia    Hypothyroidism    Long term current use of amiodarone    Long term current use of anticoagulant    a.) dose reduced apixaban   Nephrolithiasis    Osteopenia    Polymyalgia rheumatica (HCC)    Skin cancer    Splenomegaly    Temporal arteritis (HCC)    Vitamin D  deficiency     Past Surgical History:  Procedure Laterality Date   ABDOMINAL HYSTERECTOMY  03/1971   Status Post Hysterectomy   CATARACT EXTRACTION Left    COLONOSCOPY     2010   COLONOSCOPY WITH PROPOFOL N/A 01/12/2022   Procedure: COLONOSCOPY WITH PROPOFOL;  Surgeon: Midge Minium, MD;  Location: [facility] ENDOSCOPY;  Service: Endoscopy;  Laterality: N/A;   CYSTOSCOPY WITH RETROGRADE PYELOGRAM, URETEROSCOPY AND STENT PLACEMENT Left 01/13/2023   Procedure: CYSTOSCOPY WITH RETROGRADE PYELOGRAM, URETEROSCOPY AND STENT PLACEMENT;  Surgeon: Riki Altes, MD;  Location: [facility] ORS;  Service: Urology;  Laterality: Left;   CYSTOSCOPY/URETEROSCOPY/HOLMIUM LASER/STENT PLACEMENT Left 02/08/2023   Procedure: CYSTOSCOPY/URETEROSCOPY/HOLMIUM LASER/STENT EXCHANGE;  Surgeon: Riki Altes, MD;  Location: [facility] ORS;  Service: Urology;  Laterality: Left;   ESOPHAGOGASTRODUODENOSCOPY N/A 11/02/2022   Procedure: ESOPHAGOGASTRODUODENOSCOPY (EGD);  Surgeon: Midge Minium, MD;  Location: [facility] ENDOSCOPY;  Service: Endoscopy;  Laterality: N/A;   EYE SURGERY     GIVENS CAPSULE STUDY N/A 12/22/2022   Procedure: GIVENS CAPSULE STUDY;  Surgeon: Midge Minium, MD;  Location: [facility] ENDOSCOPY;  Service: Endoscopy;  Laterality: N/A;   TONSILLECTOMY     TONSILLECTOMY AND ADENOIDECTOMY  1951   WISDOM TOOTH EXTRACTION  1970       Subjective Assessment  Subjective Pt reports she was pleased with her progress tested on last visit.     Pertinent History Luane Littig is an 82yoF who is referred by PCP due to gradual progressive weakness since hospital admission in February 2024. Pt presents to PT eval with husband. Pt reports being in hospital for ~ a week in Feb 2024 with urinary obstruction, AKI, felt week initially but declined HHPT; pt/husband say progressively more weak over subsequent months, more difficulty with walking, husband has now asked her to use a cane. Pt reports 5 falls, 4 of which were outside on natural surface  5th trying to clear a threshold (posterior fall buttock, back, hit head). Does get dizzy, but not frequently, not related to falls often.    How long can you sit comfortably? Not a limitation    How long can you stand comfortably? Not a limitation    How long can you walk comfortably? Mostly walks household distances, but can tolerate occasional store trips with buggy support.    Currently in Pain? No/denies               Objective measurements completed on examination: See above findings.    TODAY'S TREATMENT: 08/25/23 - Gait without AD in clinic; 1200 feet (0.56 m/s)  Step up and opp LE march with light UE support x 15 reps alt LE Side step up and down (left to right and back right to left) x 12 reps  Standing calf raises on 1/2 foam roll x 15 reps with 2 sec hold.   Tandem gait - in // bars  (down and back x 10 reps)    PT Education -     Education Details Transition to longer walking outside of home.                          PT Short Term Goals -        PT SHORT TERM GOAL #1   Title compliant with balance HEP    Time 4    Period Weeks    Status Achieved    Target Date 08/12/23      PT SHORT TERM GOAL #2   Title Improved overground gait speed over 518ft to improve ability to access limited community distances safely.    Baseline eval: 3109ft at 0.38m/s; 65ft s device 0.58m/s.   Time 4    Period Weeks    Status Achieved     Target Date 08/12/23               PT Long Term Goals -       PT LONG TERM GOAL #1   Title FOTO score improvement >12 points to indicate improve confidence in balance.    Baseline 07/19/2023=43; 08/22/23: 51    Time 8    Period Weeks    Status On going     Target Date 09/09/23      PT LONG TERM GOAL #2   Title Improvement in standardized balance assessment by MCID or greater.    Baseline 07/19/2023= BERG- 35/56; 08/22/23: 44   Time 8    Period Weeks    Status Achieved     Target Date 09/09/23      PT LONG TERM  GOAL #3   Title Pt to demonstrate 5xSTS hands free from standard height surface to indicated improved BLE power.    Baseline 07/19/2023= 31.07 sec without UE Support; 08/22/23: 19.15sec (hands on thighs)  Time 8    Period Weeks    Status Progressing    Target Date 09/09/23      PT LONG TERM GOAL #4   Title Pt to demonstrate performance of 201ft AMB >0.30m/s without increased pain and without LOB at modI level or less to restore ability to access comunity distances for IADL.    Baseline 08/22/23: 62ft in 4:02 (0.67m/s); 08/25/2023= 1200 in .    Time 8    Period Weeks    Status Ongong    Target Date 09/09/23                 Plan -     Clinical Impression Statement Patient continued with progression as seen by increased gait distance today prior to reported fatigue. She responded well to all VC and challenged with dynamic standing balance yet no LOB- Improved tandem gait with increased practice. Pt will benefit from skilled PT services to address and improve her functional strength and mobility to assist in return to previous level of function.    Personal Factors and Comorbidities Age;Fitness;Behavior Pattern;Past/Current Experience    Examination-Activity Limitations Locomotion Level;Transfers;Lift;Stairs    Examination-Participation Restrictions Meal Prep;Cleaning;Community Activity;Driving;Interpersonal Relationship;Yard Work    Stability/Clinical Decision Making Stable/Uncomplicated    Clinical Decision Making Moderate    Rehab Potential Good    PT Frequency 2x / week    PT Duration 8 weeks    PT Treatment/Interventions ADLs/Self Care Home Management;Gait training;Stair training;Functional mobility training;Neuromuscular re-education;Balance training;Therapeutic exercise;Therapeutic activities;Passive range of motion;Manual techniques    PT Next Visit Plan Check vitals,  continue with balance and therex vor home exercises.        PT Home Exercise Plan asked at eval to  check and log BP twice daily,   Access Code: [email or web address] URL: [email or web address] Date: 07/21/2023 Prepared by: Grier Rocher  Exercises - Sit to Stand with Armchair  - 1 x daily - 5 x weekly - 2 sets - 6 reps - Standing Hip Abduction with Counter Support  - 1 x daily - 7 x weekly - 3 sets - 10 reps - Seated Hip Abduction with Resistance  - 1 x daily - 7 x weekly - 3 sets - 10 reps - Seated March with Resistance  - 1 x daily - 7 x weekly - 3 sets - 10 reps   Consulted and Agree with Plan of Care Patient;Family member/caregiver             Patient will benefit from skilled therapeutic intervention in order to improve the following deficits and impairments:   balance impairments, endurance deficits, reduced strength,   Visit Diagnosis: Muscle weakness (generalized)  Difficulty in walking, not elsewhere classified  Unsteadiness on feet  Repeated falls     Problem List Patient Active Problem List   Diagnosis Date Noted   Obstructive nephropathy 01/13/2023   Acute pyelonephritis 01/13/2023   AKI (acute kidney injury) (HCC) 01/13/2023   Hypokalemia 01/13/2023   Diarrhea 01/13/2023   Thoracic aortic aneurysm without rupture (HCC) 11/09/2022   Hematuria 11/05/2022   Polyp of esophagus 11/02/2022   Iron deficiency anemia 07/28/2022   Positive colorectal cancer screening using Cologuard test    Abnormal ECG 03/31/2021   Acute non-recurrent sinusitis 11/30/2020   Acute otitis media 11/30/2020   Benign essential HTN 10/31/2018   Frequent PVCs 09/08/2017   A-fib (HCC) 08/27/2015   Clinical depression 08/27/2015   Blood pressure elevated 08/27/2015   Polypharmacy 08/27/2015   Esophagitis, reflux 08/27/2015  Fatigue 08/27/2015   Acid reflux 08/27/2015   HLD (hyperlipidemia) 08/27/2015   Adult hypothyroidism 08/27/2015   Adaptive colitis 08/27/2015   Malaise and fatigue 08/27/2015   Mild major depression (HCC) 08/27/2015   Polymyalgia rheumatica (HCC)  08/27/2015   Cranial arteritis (HCC) 08/27/2015   Avitaminosis D 08/27/2015    2:47 PM, 08/25/23 Louis Meckel, PT Physical Therapist - [facility]  Outpatient Physical Therapy- [facility] [phone]

## 2023-08-30 ENCOUNTER — Ambulatory Visit (INDEPENDENT_AMBULATORY_CARE_PROVIDER_SITE_OTHER): Payer: Medicare Other | Admitting: Vascular Surgery

## 2023-08-30 ENCOUNTER — Encounter (INDEPENDENT_AMBULATORY_CARE_PROVIDER_SITE_OTHER): Payer: Self-pay | Admitting: Vascular Surgery

## 2023-08-30 VITALS — BP 125/71 | HR 73 | Resp 18 | Ht 65.0 in | Wt 129.0 lb

## 2023-08-30 DIAGNOSIS — E785 Hyperlipidemia, unspecified: Secondary | ICD-10-CM | POA: Diagnosis not present

## 2023-08-30 DIAGNOSIS — I10 Essential (primary) hypertension: Secondary | ICD-10-CM

## 2023-08-30 DIAGNOSIS — I7123 Aneurysm of the descending thoracic aorta, without rupture: Secondary | ICD-10-CM | POA: Diagnosis not present

## 2023-08-30 NOTE — Assessment & Plan Note (Signed)
lipid control important in reducing the progression of atherosclerotic disease. Continue statin therapy  

## 2023-08-30 NOTE — Patient Instructions (Signed)
Thoracic Aortic Aneurysm  An aneurysm is a bulge in an artery. It happens when blood pushes against a weak or damaged artery wall. A thoracic aortic aneurysm (TAA) happens in the upper part of the aorta, between the heart and the diaphragm. The aorta is the largest artery of the body. It supplies blood from the heart to the rest of the body. Some aneurysms may not cause symptoms. But a TAA can cause two serious problems: It can grow and then burst (rupture). It can cause blood to flow between the layers of the wall of the aorta through a tear (aortic dissection). These problems are medical emergencies. They can cause internal bleeding and should be treated right away. What are the causes? A TAA may be caused by: Cystic medial degeneration. This is when the fibers of the wall of the aorta become weak and expand. A genetic disease that weakens body tissue. This includes Marfan syndrome. In some cases, the cause is not known. What increases the risk? You may be more likely to have an aneurysm if: You are 81 years of age or older. You have a family history of aneurysms. You use or have used nicotine or tobacco products. You have: Arteriosclerosis. This is when your arteries harden. Arteritis. This is inflammation of the walls of an artery. An injury or trauma to your aorta. High blood pressure (hypertension). High cholesterol. Bicuspid aortic valve. This is when only two flaps (valves) over your aorta are working instead of three. This can make it harder for your heart to pump blood. What are the signs or symptoms? Symptoms depend on how big the aneurysm is and how fast it is growing. Most grow slowly and do not cause symptoms. In some cases, you may have: Pain in your chest, back, sides, or abdomen. If the pain is sudden and severe, it may mean that the aneurysm has ruptured. A cough or hoarseness. Shortness of breath. Trouble swallowing. Swelling in your face, arms, or  legs. Fainting. How is this diagnosed? This condition may be diagnosed based on: Ultrasound. X-rays. CT scan. MRI. Lab tests. Angiogram. This test checks your arteries for damage or blockage. Many aneurysms are found during exams for other conditions. How is this treated? Treatment depends on: The size of the aneurysm. How fast it is growing. Your age. Risk factors for rupture. If your aneurysm is smaller than 2.2 inches (5.5 cm), you may need: Medicines to: Control blood pressure. Treat pain. Fight infection. Control cholesterol. An ultrasound or CT scan every 6 months or year to see if the aneurysm is getting bigger. If your aneurysm is larger or growing fast, you may need surgery. Follow these instructions at home: Eating and drinking  Eat a healthy diet. You may need to: Take in less salt (sodium). Too much salt can raise your blood pressure and increase your risk for a TAA. Avoid foods that are high in saturated fat and cholesterol. These include red meat and some dairy products. Eat less sugar. Take in more fiber. You may need to eat more whole grains, vegetables, and fruit. Lifestyle Do not use any products that contain nicotine or tobacco. These products include cigarettes, chewing tobacco, and vaping devices, such as e-cigarettes. If you need help quitting, ask your health care provider. Check your blood pressure often. Follow instructions on how to keep it within normal limits. Have your blood sugar (glucose) level and cholesterol levels checked often. Exercise on a regular basis. Talk with your provider about how often  to exercise and which types of exercise are best for you. Maintain a healthy weight. Alcohol use Do not drink alcohol if: Your provider tells you not to drink. You are pregnant, may be pregnant, or plan to become pregnant. If you drink alcohol: Limit how much you have to: 0-1 drink a day if you are female. 0-2 drinks if you are female. Know how  much alcohol is in your drink. In the [place] one drink is one 12 oz bottle of beer (355 mL), one 5 oz glass of wine (148 mL), or one 1 oz glass of hard liquor (44 mL). General instructions Take over-the-counter and prescription medicines only as told by your provider. You may have to avoid lifting. Ask your provider how much you can safely lift. Keep all follow-up visits. Your provider will need to watch the size of your aneurysm and how fast it is growing. Contact a health care provider if: You lose weight and do not know why. You have trouble swallowing. You have a cough or hoarseness. Get help right away if: You have sudden, severe pain in your abdomen, side, or back. It may move into your chest and arms. You are short of breath. You feel light-headed, or you faint. You have any symptoms of a stroke. "BE FAST" is an easy way to remember the main warning signs of a stroke: B - Balance. Signs are dizziness, sudden trouble walking, or loss of balance. E - Eyes. Signs are trouble seeing or a sudden change in vision. F - Face. Signs are sudden weakness or numbness of the face, or the face or eyelid drooping on one side. A - Arms. Signs are weakness or numbness in an arm. This happens suddenly and usually on one side of the body. S - Speech. Signs are sudden trouble speaking, slurred speech, or trouble understanding what people say. T - Time. Time to call emergency services. Write down what time symptoms started. You have other signs of a stroke, such as: A sudden, severe headache with no known cause. Nausea or vomiting. Seizure. These symptoms may be an emergency. Get help right away. Call 911. Do not wait to see if the symptoms will go away. Do not drive yourself to the hospital. This information is not intended to replace advice given to you by your health care provider. Make sure you discuss any questions you have with your health care provider. Document Revised: 07/15/2022 Document  Reviewed: 07/15/2022 Elsevier Patient Education  2024 [employer].

## 2023-08-30 NOTE — Assessment & Plan Note (Signed)
She has undergone a CT angiogram of the chest last month which I have independently reviewed.  The official report quoted the largest thoracic diameter of 4.2 cm.  This would appear to be a dramatic underrepresentation since her original scan from last October and of January from this year were read out at 4.9 cm.  My measurement today would be that her thoracic aorta maximal diameter continues to be 4.9 cm with aneurysmal degeneration of 2 penetrating ulcers in the chest.  This is essentially unchanged from her study last year. At this size, I would not recommend any intervention particularly because it has been stable for the past year on 3 separate scans.  It is not largely symptomatic.  I will continue to follow this on 47-month intervals with CT scans of the chest.  They will contact our office with any problems in the interim.

## 2023-08-30 NOTE — Progress Notes (Signed)
MRN : [id number]  Tara Marquez is a 82 y.o. (1941/05/21) female who presents with chief complaint of  Chief Complaint  Patient presents with   Follow-up    CT RESULTS  .  History of Present Illness: Patient returns today in follow up of her descending thoracic aortic aneurysm.  She has had a lot of other ongoing medical issues and has been getting physical therapy now for a couple of months.  None of her issues would likely be related to her thoracic aneurysm.  No back or chest pain.  No signs of peripheral embolization.  She has undergone a CT angiogram of the chest last month which I have independently reviewed.  The official report quoted the largest thoracic diameter of 4.2 cm.  This would appear to be a dramatic underrepresentation since her original scan from last October and of January from this year were read out at 4.9 cm.  My measurement today would be that her thoracic aorta maximal diameter continues to be 4.9 cm with aneurysmal degeneration of 2 penetrating ulcers in the chest.  This is essentially unchanged from her study last year.  Current Outpatient Medications  Medication Sig Dispense Refill   amiodarone (PACERONE) 200 MG tablet Take 1 tablet (200 mg total) by mouth 2 (two) times daily. 50 tablet 0   apixaban (ELIQUIS) 2.5 MG TABS tablet Take 1 tablet (2.5 mg total) by mouth 2 (two) times daily. 60 tablet 0   atorvastatin (LIPITOR) 40 MG tablet Take 1 tablet (40 mg total) by mouth daily at 6 PM. (Patient taking differently: Take 40 mg by mouth every evening.) 30 tablet 0   levothyroxine (SYNTHROID) 75 MCG tablet Take 1 tablet (75 mcg total) by mouth daily. (Patient taking differently: Take 75 mcg by mouth daily before breakfast.) 90 tablet 1   lisinopril (ZESTRIL) 10 MG tablet Take 10 mg by mouth daily.     metoprolol succinate (TOPROL-XL) 25 MG 24 hr tablet Take 25 mg by mouth at bedtime.     nitroGLYCERIN (NITROSTAT) 0.4 MG SL tablet Place 1 tablet (0.4 mg total) under the  tongue every 5 (five) minutes as needed for chest pain. 30 tablet 2   ondansetron (ZOFRAN) 4 MG tablet Take 4 mg by mouth every morning.     sertraline (ZOLOFT) 100 MG tablet TAKE 1 & 1/2 (ONE & ONE-HALF) TABLETS BY MOUTH ONCE DAILY NEED  TO  SCHEDULE  AN  APPOINTMENT (Patient taking differently: Take 150 mg by mouth every morning. TAKE 1 & 1/2 (ONE & ONE-HALF) TABLETS BY MOUTH ONCE DAILY NEED  TO  SCHEDULE  AN  APPOINTMENT) 135 tablet 3   No current facility-administered medications for this visit.    Past Medical History:  Diagnosis Date   Acute pyelonephritis    Adaptive colitis    Anemia    Aortic atherosclerosis (HCC)    Atrial fibrillation and flutter (HCC)    a.) CHA2DS2VASc = 5 (age x 2, sex, HTN, vascular disease history);  b.) rate/rhythm maintained on oral amiodarone + metoprolol succinate; chronically anticoagulated with dose reduced apixaban   CAD (coronary artery disease)    Depression    Descending thoracic aortic aneurysm (HCC)    a.) CT chest 09/29/2022: 4.9 cm; b.) CT abd 01/13/2023: 4.9 cm   Frequent PVCs    GERD (gastroesophageal reflux disease)    Hepatic steatosis    History of 2019 novel coronavirus disease (COVID-19) 12/04/2021   Hyperlipidemia    Hypertension  Hypokalemia    Hypothyroidism    Long term current use of amiodarone    Long term current use of anticoagulant    a.) dose reduced apixaban   Nephrolithiasis    Osteopenia    Polymyalgia rheumatica (HCC)    Skin cancer    Splenomegaly    Temporal arteritis (HCC)    Vitamin D deficiency     Past Surgical History:  Procedure Laterality Date   ABDOMINAL HYSTERECTOMY  03/1971   Status Post Hysterectomy   CATARACT EXTRACTION Left    COLONOSCOPY     2010   COLONOSCOPY WITH PROPOFOL N/A 01/12/2022   Procedure: COLONOSCOPY WITH PROPOFOL;  Surgeon: Midge Minium, MD;  Location: [facility] ENDOSCOPY;  Service: Endoscopy;  Laterality: N/A;   CYSTOSCOPY WITH RETROGRADE PYELOGRAM, URETEROSCOPY AND STENT  PLACEMENT Left 01/13/2023   Procedure: CYSTOSCOPY WITH RETROGRADE PYELOGRAM, URETEROSCOPY AND STENT PLACEMENT;  Surgeon: Riki Altes, MD;  Location: [facility] ORS;  Service: Urology;  Laterality: Left;   CYSTOSCOPY/URETEROSCOPY/HOLMIUM LASER/STENT PLACEMENT Left 02/08/2023   Procedure: CYSTOSCOPY/URETEROSCOPY/HOLMIUM LASER/STENT EXCHANGE;  Surgeon: Riki Altes, MD;  Location: [facility] ORS;  Service: Urology;  Laterality: Left;   ESOPHAGOGASTRODUODENOSCOPY N/A 11/02/2022   Procedure: ESOPHAGOGASTRODUODENOSCOPY (EGD);  Surgeon: Midge Minium, MD;  Location: [facility] ENDOSCOPY;  Service: Endoscopy;  Laterality: N/A;   EYE SURGERY     GIVENS CAPSULE STUDY N/A 12/22/2022   Procedure: GIVENS CAPSULE STUDY;  Surgeon: Midge Minium, MD;  Location: [facility] ENDOSCOPY;  Service: Endoscopy;  Laterality: N/A;   TONSILLECTOMY     TONSILLECTOMY AND ADENOIDECTOMY  1951   WISDOM TOOTH EXTRACTION  1970     Social History   Tobacco Use   Smoking status: Never   Smokeless tobacco: Never  Vaping Use   Vaping status: Never Used  Substance Use Topics   Alcohol use: No   Drug use: No      Family History  Problem Relation Age of Onset   Cancer Sister        Has had Breast Cancer and is in remission   Breast cancer Sister 76   Congenital heart disease Son      Allergies  Allergen Reactions   Sulfa Antibiotics Hives   Amoxicillin Rash   Penicillins Rash     REVIEW OF SYSTEMS (Negative unless checked)   Constitutional: [] Weight loss  [] Fever  [] Chills Cardiac: [] Chest pain   [] Chest pressure   [] Palpitations   [] Shortness of breath when laying flat   [] Shortness of breath at rest   [] Shortness of breath with exertion. Vascular:  [] Pain in legs with walking   [] Pain in legs at rest   [] Pain in legs when laying flat   [] Claudication   [] Pain in feet when walking  [] Pain in feet at rest  [] Pain in feet when laying flat   [] History of DVT   [] Phlebitis   [] Swelling in legs   [] Varicose veins   [] Non-healing  ulcers Pulmonary:   [] Uses home oxygen   [] Productive cough   [] Hemoptysis   [] Wheeze  [] COPD   [] Asthma Neurologic:  [] Dizziness  [] Blackouts   [] Seizures   [] History of stroke   [] History of TIA  [] Aphasia   [] Temporary blindness   [] Dysphagia   [] Weakness or numbness in arms   [x] Weakness or numbness in legs Musculoskeletal:  [] Arthritis   [] Joint swelling   [] Joint pain   [] Low back pain Hematologic:  [] Easy bruising  [] Easy bleeding   [] Hypercoagulable state   [x] Anemic  [] Hepatitis Gastrointestinal:  [] Blood in  stool   [] Vomiting blood  [x] Gastroesophageal reflux/heartburn   [] Abdominal pain Genitourinary:  [] Chronic kidney disease   [] Difficult urination  [] Frequent urination  [] Burning with urination   [x] Hematuria Skin:  [] Rashes   [] Ulcers   [] Wounds Psychological:  [] History of anxiety   [x]  History of major depression.  Physical Examination  BP 125/71 (BP Location: Left Arm)   Pulse 73   Resp 18   Ht 5\' 5"  (1.651 m)   Wt 129 lb (58.5 kg)   BMI 21.47 kg/m  Gen:  WD/WN, NAD Head: Nelson/AT, + temporalis wasting. Ear/Nose/Throat: Hearing grossly intact, nares w/o erythema or drainage Eyes: Conjunctiva clear. Sclera non-icteric Neck: Supple.  Trachea midline Pulmonary:  Good air movement, no use of accessory muscles.  Cardiac: RRR, no JVD Vascular:  Vessel Right Left  Radial Palpable Palpable                          PT Palpable Palpable  DP Palpable Palpable   Gastrointestinal: soft, non-tender/non-distended. No guarding/reflex.  Musculoskeletal: M/S 5/5 throughout.  No deformity or atrophy.  No significant lower extremity edema. Neurologic: Sensation grossly intact in extremities.  Symmetrical.  Speech is fluent.  Psychiatric: Judgment and insight appear fair. Dermatologic: No rashes or ulcers noted.  No cellulitis or open wounds.      Labs No results found for this or any previous visit (from the past 2160 hour(s)).  Radiology CT ANGIO CHEST AORTA W/CM & OR  WO/CM  Result Date: 08/22/2023 CLINICAL DATA:  Thoracic aorta disease, pre-op planning EXAM: CT ANGIOGRAPHY CHEST WITH CONTRAST TECHNIQUE: Multidetector CT imaging of the chest was performed using the standard protocol during bolus administration of intravenous contrast. Multiplanar CT image reconstructions and MIPs were obtained to evaluate the vascular anatomy. RADIATION DOSE REDUCTION: This exam was performed according to the departmental dose-optimization program which includes automated exposure control, adjustment of the mA and/or kV according to patient size and/or use of iterative reconstruction technique. CONTRAST:  60mL OMNIPAQUE IOHEXOL 350 MG/ML SOLN COMPARISON:  Chest XR, 01/18/2023.  CTA chest, 09/29/2022. FINDINGS: CARDIOVASCULAR: Limitations by motion: Mild Preferential opacification of the thoracic aorta. Similar degree in appearance of short-segment dissections versus large penetrating ulcers within the descending thoracic aorta, measuring up to 3.1 cm superiorly and 1.3 cm inferiorly. Small volume of associated aortic intramural hematoma. No evidence of acute or enlarging thoracic aortic aneurysm or dissection. Aortic Root: --Valve: 2.3 cm --Sinuses: 3.0 cm --Sinotubular Junction: 2.4 cm Thoracic Aorta: --Ascending Aorta: 2.7 cm --Aortic Arch: 0.6 cm --Descending Aorta: 4.2 cm Other: Normal heart size. No pericardial effusion. Moderate burden of calcified coronary atherosclerosis within the LAD is present LEFT aortic arch and common "bovine" variant, with shared origin of the brachiocephalic and LEFT common carotid arteries. No atherosclerosis or hemodynamic stenosis at the origins of the great vessels. Mediastinum/Nodes: No enlarged mediastinal, hilar, or axillary lymph nodes. Thyroid gland, trachea, and esophagus demonstrate no significant findings. Lungs/Pleura: Lungs are clear without focal consolidation, mass or suspicious pulmonary nodule. No pleural effusion or pneumothorax. Upper  Abdomen: No acute abnormality. Musculoskeletal: No acute chest wall abnormality. No acute or significant osseous findings. Review of the MIP images confirms the above findings. IMPRESSION: Since CTA chest dated 09/29/2022; 1. No CTA evidence of acute or enlarging thoracic aortic aneurysm or dissection. 2. Similar degree in appearance of short-segment descending thoracic aortic dissection versus penetrating ulcers, with intramural hematoma. 3. 4.2 cm aneurysmal dilatation of the descending thoracic aorta. Continue semi-annual  imaging followup by CTA and referral to cardiothoracic surgery if not already obtained This recommendation follows 2010 ACCF/AHA/AATS/ACR/ASA/SCA/SCAI/SIR/STS/SVM Guidelines for the Diagnosis and Management of Patients With Thoracic Aortic Disease. Circulation. 2010; 121: W098-J191. Aortic aneurysm NOS (ICD10-I71.9) Electronically Signed   By: Roanna Banning M.D.   On: 08/22/2023 16:31    Assessment/Plan  Benign essential HTN blood pressure control important in reducing the progression of atherosclerotic disease and aneurysmal growth. On appropriate oral medications.   HLD (hyperlipidemia) lipid control important in reducing the progression of atherosclerotic disease. Continue statin therapy   Thoracic aortic aneurysm without rupture [facility]) She has undergone a CT angiogram of the chest last month which I have independently reviewed.  The official report quoted the largest thoracic diameter of 4.2 cm.  This would appear to be a dramatic underrepresentation since her original scan from last October and of January from this year were read out at 4.9 cm.  My measurement today would be that her thoracic aorta maximal diameter continues to be 4.9 cm with aneurysmal degeneration of 2 penetrating ulcers in the chest.  This is essentially unchanged from her study last year. At this size, I would not recommend any intervention particularly because it has been stable for the past year on 3  separate scans.  It is not largely symptomatic.  I will continue to follow this on 66-month intervals with CT scans of the chest.  They will contact our office with any problems in the interim.    Festus Barren, MD  08/30/2023 1:54 PM    This note was created with Dragon medical transcription system.  Any errors from dictation are purely unintentional

## 2023-08-30 NOTE — Assessment & Plan Note (Signed)
blood pressure control important in reducing the progression of atherosclerotic disease and aneurysmal growth. On appropriate oral medications.  

## 2023-09-01 ENCOUNTER — Ambulatory Visit: Payer: Medicare Other | Attending: Family Medicine

## 2023-09-01 DIAGNOSIS — R262 Difficulty in walking, not elsewhere classified: Secondary | ICD-10-CM | POA: Insufficient documentation

## 2023-09-01 DIAGNOSIS — R2681 Unsteadiness on feet: Secondary | ICD-10-CM | POA: Diagnosis not present

## 2023-09-01 DIAGNOSIS — M6281 Muscle weakness (generalized): Secondary | ICD-10-CM | POA: Diagnosis not present

## 2023-09-01 DIAGNOSIS — R296 Repeated falls: Secondary | ICD-10-CM | POA: Insufficient documentation

## 2023-09-01 NOTE — Therapy (Signed)
[facility] Outpatient Rehabilitation at [facility] [address] [place], [place], [address] Phone: [phone]   Fax:  [phone]  Outpatient Physical Therapy Treatment   Patient Details  Name: Tara Marquez MRN: [id number] Date of Birth: 11-23-1941 Referring Provider (PT): Julieanne Manson MD (PCP)   Encounter Date: 09/01/2023   PT End of Session - 09/01/23 1320     Visit Number 12    Number of Visits 17    Date for PT Re-Evaluation 09/08/23    Authorization Type Medicare A&B primary with BCBS supplemental secondary    Authorization Time Period 07/14/23-09/08/23    Progress Note Due on Visit 10    PT Start Time 1315    PT Stop Time 1358    PT Time Calculation (min) 43 min    Equipment Utilized During Treatment Gait belt    Activity Tolerance Patient tolerated treatment well;No increased pain    Behavior During Therapy WFL for tasks assessed/performed                 Past Medical History:  Diagnosis Date   Acute pyelonephritis    Adaptive colitis    Anemia    Aortic atherosclerosis (HCC)    Atrial fibrillation and flutter (HCC)    a.) CHA2DS2VASc = 5 (age x 2, sex, HTN, vascular disease history);  b.) rate/rhythm maintained on oral amiodarone + metoprolol succinate; chronically anticoagulated with dose reduced apixaban   CAD (coronary artery disease)    Depression    Descending thoracic aortic aneurysm (HCC)    a.) CT chest 09/29/2022: 4.9 cm; b.) CT abd 01/13/2023: 4.9 cm   Frequent PVCs    GERD (gastroesophageal reflux disease)    Hepatic steatosis    History of 2019 novel coronavirus disease (COVID-19) 12/04/2021   Hyperlipidemia    Hypertension    Hypokalemia    Hypothyroidism    Long term current use of amiodarone    Long term current use of anticoagulant    a.) dose reduced apixaban   Nephrolithiasis    Osteopenia    Polymyalgia rheumatica (HCC)    Skin cancer    Splenomegaly    Temporal arteritis (HCC)    Vitamin D  deficiency     Past Surgical History:  Procedure Laterality Date   ABDOMINAL HYSTERECTOMY  03/1971   Status Post Hysterectomy   CATARACT EXTRACTION Left    COLONOSCOPY     2010   COLONOSCOPY WITH PROPOFOL N/A 01/12/2022   Procedure: COLONOSCOPY WITH PROPOFOL;  Surgeon: Midge Minium, MD;  Location: [facility] ENDOSCOPY;  Service: Endoscopy;  Laterality: N/A;   CYSTOSCOPY WITH RETROGRADE PYELOGRAM, URETEROSCOPY AND STENT PLACEMENT Left 01/13/2023   Procedure: CYSTOSCOPY WITH RETROGRADE PYELOGRAM, URETEROSCOPY AND STENT PLACEMENT;  Surgeon: Riki Altes, MD;  Location: [facility] ORS;  Service: Urology;  Laterality: Left;   CYSTOSCOPY/URETEROSCOPY/HOLMIUM LASER/STENT PLACEMENT Left 02/08/2023   Procedure: CYSTOSCOPY/URETEROSCOPY/HOLMIUM LASER/STENT EXCHANGE;  Surgeon: Riki Altes, MD;  Location: [facility] ORS;  Service: Urology;  Laterality: Left;   ESOPHAGOGASTRODUODENOSCOPY N/A 11/02/2022   Procedure: ESOPHAGOGASTRODUODENOSCOPY (EGD);  Surgeon: Midge Minium, MD;  Location: [facility] ENDOSCOPY;  Service: Endoscopy;  Laterality: N/A;   EYE SURGERY     GIVENS CAPSULE STUDY N/A 12/22/2022   Procedure: GIVENS CAPSULE STUDY;  Surgeon: Midge Minium, MD;  Location: [facility] ENDOSCOPY;  Service: Endoscopy;  Laterality: N/A;   TONSILLECTOMY     TONSILLECTOMY AND ADENOIDECTOMY  1951   WISDOM TOOTH EXTRACTION  1970       Subjective Assessment  Subjective Pt reports no new issues since last week.   Pertinent History Damyra Hirth is an 82yoF who is referred by PCP due to gradual progressive weakness since hospital admission in February 2024. Pt presents to PT eval with husband. Pt reports being in hospital for ~ a week in Feb 2024 with urinary obstruction, AKI, felt week initially but declined HHPT; pt/husband say progressively more weak over subsequent months, more difficulty with walking, husband has now asked her to use a cane. Pt reports 5 falls, 4 of which were outside on natural surface 5th trying to clear a  threshold (posterior fall buttock, back, hit head). Does get dizzy, but not frequently, not related to falls often.    How long can you sit comfortably? Not a limitation    How long can you stand comfortably? Not a limitation    How long can you walk comfortably? Mostly walks household distances, but can tolerate occasional store trips with buggy support.    Currently in Pain? No/denies               Objective measurements completed on examination: See above findings.    TODAY'S TREATMENT: 09/01/23 -  BP (sitting) right UE at rest at beginning of visit=  142/51 mmHg and HR= 68 bpm  Therex:  Step tap on 12" box with BUE Support x 15 reps each LE Rechecked BP in standing posiiton= 134/78 mmhg   Side step in // bars: down and back x 3 - c/o dizziness   Seated hip march x 5 with 3# AW x 12 reps Seated left lift up/over cone- 3# AW x 12 reps Seated calf raises from 1/2 foam 3# AW x 12 reps Seated resistive ankle DF using GTB 2 x10 reps Rechecked BP= 141/58 mmHg Seated hip add squeeze with ball +LAQ- x 12 reps each LE (VC for technique) Seated ham curls GTB x 12 reps each LE    PT Education -     Education Details Transition to longer walking outside of home.                          PT Short Term Goals -        PT SHORT TERM GOAL #1   Title compliant with balance HEP    Time 4    Period Weeks    Status Achieved    Target Date 08/12/23      PT SHORT TERM GOAL #2   Title Improved overground gait speed over 553ft to improve ability to access limited community distances safely.    Baseline eval: 361ft at 0.68m/s; 644ft s device 0.48m/s.   Time 4    Period Weeks    Status Achieved     Target Date 08/12/23               PT Long Term Goals -       PT LONG TERM GOAL #1   Title FOTO score improvement >12 points to indicate improve confidence in balance.    Baseline 07/19/2023=43; 08/22/23: 51    Time 8    Period Weeks    Status On going     Target  Date 09/09/23      PT LONG TERM GOAL #2   Title Improvement in standardized balance assessment by MCID or greater.    Baseline 07/19/2023= BERG- 35/56; 08/22/23: 44   Time 8    Period Weeks    Status Achieved  Target Date 09/09/23      PT LONG TERM GOAL #3   Title Pt to demonstrate 5xSTS hands free from standard height surface to indicated improved BLE power.    Baseline 07/19/2023= 31.07 sec without UE Support; 08/22/23: 19.15sec (hands on thighs)    Time 8    Period Weeks    Status Progressing    Target Date 09/09/23      PT LONG TERM GOAL #4   Title Pt to demonstrate performance of 2053ft AMB >0.21m/s without increased pain and without LOB at modI level or less to restore ability to access comunity distances for IADL.    Baseline 08/22/23: 648ft in 4:02 (0.1m/s); 08/25/2023= 1200 in .    Time 8    Period Weeks    Status Ongong    Target Date 09/09/23                 Plan -     Clinical Impression Statement Treatment limited to seated therex secondary to patient reporting some dizziness with standing. Encouraged her to hydrate as she admitted to not drinking water. She responded well to resistive LE strengthening in seated position.  Pt will benefit from skilled PT services to address and improve her functional strength and mobility to assist in return to previous level of function.    Personal Factors and Comorbidities Age;Fitness;Behavior Pattern;Past/Current Experience    Examination-Activity Limitations Locomotion Level;Transfers;Lift;Stairs    Examination-Participation Restrictions Meal Prep;Cleaning;Community Activity;Driving;Interpersonal Relationship;Yard Work    Stability/Clinical Decision Making Stable/Uncomplicated    Clinical Decision Making Moderate    Rehab Potential Good    PT Frequency 2x / week    PT Duration 8 weeks    PT Treatment/Interventions ADLs/Self Care Home Management;Gait training;Stair training;Functional mobility training;Neuromuscular  re-education;Balance training;Therapeutic exercise;Therapeutic activities;Passive range of motion;Manual techniques    PT Next Visit Plan Check vitals,  continue with balance and therex vor home exercises.        PT Home Exercise Plan asked at eval to check and log BP twice daily,   Access Code: [email or web address] URL: [email or web address] Date: 07/21/2023 Prepared by: Grier Rocher  Exercises - Sit to Stand with Armchair  - 1 x daily - 5 x weekly - 2 sets - 6 reps - Standing Hip Abduction with Counter Support  - 1 x daily - 7 x weekly - 3 sets - 10 reps - Seated Hip Abduction with Resistance  - 1 x daily - 7 x weekly - 3 sets - 10 reps - Seated March with Resistance  - 1 x daily - 7 x weekly - 3 sets - 10 reps   Consulted and Agree with Plan of Care Patient;Family member/caregiver             Patient will benefit from skilled therapeutic intervention in order to improve the following deficits and impairments:   balance impairments, endurance deficits, reduced strength,   Visit Diagnosis: Muscle weakness (generalized)  Difficulty in walking, not elsewhere classified  Unsteadiness on feet  Repeated falls     Problem List Patient Active Problem List   Diagnosis Date Noted   Obstructive nephropathy 01/13/2023   Acute pyelonephritis 01/13/2023   AKI (acute kidney injury) (HCC) 01/13/2023   Hypokalemia 01/13/2023   Diarrhea 01/13/2023   Thoracic aortic aneurysm without rupture (HCC) 11/09/2022   Hematuria 11/05/2022   Polyp of esophagus 11/02/2022   Iron deficiency anemia 07/28/2022   Positive colorectal cancer screening using Cologuard test    Abnormal ECG 03/31/2021  Acute non-recurrent sinusitis 11/30/2020   Acute otitis media 11/30/2020   Benign essential HTN 10/31/2018   Frequent PVCs 09/08/2017   A-fib (HCC) 08/27/2015   Clinical depression 08/27/2015   Blood pressure elevated 08/27/2015   Polypharmacy 08/27/2015   Esophagitis, reflux 08/27/2015    Fatigue 08/27/2015   Acid reflux 08/27/2015   HLD (hyperlipidemia) 08/27/2015   Adult hypothyroidism 08/27/2015   Adaptive colitis 08/27/2015   Malaise and fatigue 08/27/2015   Mild major depression (HCC) 08/27/2015   Polymyalgia rheumatica (HCC) 08/27/2015   Cranial arteritis (HCC) 08/27/2015   Avitaminosis D 08/27/2015    2:07 PM, 09/01/23 Louis Meckel, PT Physical Therapist - [facility]  Outpatient Physical Therapy- Main Campus [phone]

## 2023-09-02 DIAGNOSIS — D225 Melanocytic nevi of trunk: Secondary | ICD-10-CM | POA: Diagnosis not present

## 2023-09-02 DIAGNOSIS — D2272 Melanocytic nevi of left lower limb, including hip: Secondary | ICD-10-CM | POA: Diagnosis not present

## 2023-09-02 DIAGNOSIS — Z85828 Personal history of other malignant neoplasm of skin: Secondary | ICD-10-CM | POA: Diagnosis not present

## 2023-09-02 DIAGNOSIS — D2261 Melanocytic nevi of right upper limb, including shoulder: Secondary | ICD-10-CM | POA: Diagnosis not present

## 2023-09-02 DIAGNOSIS — D2262 Melanocytic nevi of left upper limb, including shoulder: Secondary | ICD-10-CM | POA: Diagnosis not present

## 2023-09-02 DIAGNOSIS — L821 Other seborrheic keratosis: Secondary | ICD-10-CM | POA: Diagnosis not present

## 2023-09-02 DIAGNOSIS — D2271 Melanocytic nevi of right lower limb, including hip: Secondary | ICD-10-CM | POA: Diagnosis not present

## 2023-09-05 ENCOUNTER — Ambulatory Visit: Payer: Medicare Other

## 2023-09-05 DIAGNOSIS — R262 Difficulty in walking, not elsewhere classified: Secondary | ICD-10-CM | POA: Diagnosis not present

## 2023-09-05 DIAGNOSIS — R296 Repeated falls: Secondary | ICD-10-CM | POA: Diagnosis not present

## 2023-09-05 DIAGNOSIS — M6281 Muscle weakness (generalized): Secondary | ICD-10-CM

## 2023-09-05 DIAGNOSIS — R2681 Unsteadiness on feet: Secondary | ICD-10-CM | POA: Diagnosis not present

## 2023-09-05 NOTE — Therapy (Signed)
[facility] Outpatient Rehabilitation at [facility] [address] [place], [place], [address] Phone: [phone]   Fax:  [phone]  Outpatient Physical Therapy Treatment   Patient Details  Name: Tara Marquez MRN: [id number] Date of Birth: 23-Dec-1941 Referring Provider (PT): Julieanne Manson MD (PCP)   Encounter Date: 09/05/2023   PT End of Session - 09/05/23 1017     Visit Number 13    Number of Visits 17    Date for PT Re-Evaluation 09/08/23    Authorization Type Medicare A&B primary with BCBS supplemental secondary    Authorization Time Period 07/14/23-09/08/23    Progress Note Due on Visit 10    PT Start Time 1018    Equipment Utilized During Treatment Gait belt    Activity Tolerance Patient tolerated treatment well;No increased pain    Behavior During Therapy WFL for tasks assessed/performed                 Past Medical History:  Diagnosis Date   Acute pyelonephritis    Adaptive colitis    Anemia    Aortic atherosclerosis (HCC)    Atrial fibrillation and flutter (HCC)    a.) CHA2DS2VASc = 5 (age x 2, sex, HTN, vascular disease history);  b.) rate/rhythm maintained on oral amiodarone + metoprolol succinate; chronically anticoagulated with dose reduced apixaban   CAD (coronary artery disease)    Depression    Descending thoracic aortic aneurysm (HCC)    a.) CT chest 09/29/2022: 4.9 cm; b.) CT abd 01/13/2023: 4.9 cm   Frequent PVCs    GERD (gastroesophageal reflux disease)    Hepatic steatosis    History of 2019 novel coronavirus disease (COVID-19) 12/04/2021   Hyperlipidemia    Hypertension    Hypokalemia    Hypothyroidism    Long term current use of amiodarone    Long term current use of anticoagulant    a.) dose reduced apixaban   Nephrolithiasis    Osteopenia    Polymyalgia rheumatica (HCC)    Skin cancer    Splenomegaly    Temporal arteritis (HCC)    Vitamin D deficiency     Past Surgical History:  Procedure Laterality  Date   ABDOMINAL HYSTERECTOMY  03/1971   Status Post Hysterectomy   CATARACT EXTRACTION Left    COLONOSCOPY     2010   COLONOSCOPY WITH PROPOFOL N/A 01/12/2022   Procedure: COLONOSCOPY WITH PROPOFOL;  Surgeon: Midge Minium, MD;  Location: [facility] ENDOSCOPY;  Service: Endoscopy;  Laterality: N/A;   CYSTOSCOPY WITH RETROGRADE PYELOGRAM, URETEROSCOPY AND STENT PLACEMENT Left 01/13/2023   Procedure: CYSTOSCOPY WITH RETROGRADE PYELOGRAM, URETEROSCOPY AND STENT PLACEMENT;  Surgeon: Riki Altes, MD;  Location: [facility] ORS;  Service: Urology;  Laterality: Left;   CYSTOSCOPY/URETEROSCOPY/HOLMIUM LASER/STENT PLACEMENT Left 02/08/2023   Procedure: CYSTOSCOPY/URETEROSCOPY/HOLMIUM LASER/STENT EXCHANGE;  Surgeon: Riki Altes, MD;  Location: [facility] ORS;  Service: Urology;  Laterality: Left;   ESOPHAGOGASTRODUODENOSCOPY N/A 11/02/2022   Procedure: ESOPHAGOGASTRODUODENOSCOPY (EGD);  Surgeon: Midge Minium, MD;  Location: [facility] ENDOSCOPY;  Service: Endoscopy;  Laterality: N/A;   EYE SURGERY     GIVENS CAPSULE STUDY N/A 12/22/2022   Procedure: GIVENS CAPSULE STUDY;  Surgeon: Midge Minium, MD;  Location: [facility] ENDOSCOPY;  Service: Endoscopy;  Laterality: N/A;   TONSILLECTOMY     TONSILLECTOMY AND ADENOIDECTOMY  1951   WISDOM TOOTH EXTRACTION  1970       Subjective Assessment     Subjective Pt reports feeling about the same. Denies any falls and no  pain.    Pertinent History Tara Marquez is an 82yoF who is referred by PCP due to gradual progressive weakness since hospital admission in February 2024. Pt presents to PT eval with husband. Pt reports being in hospital for ~ a week in Feb 2024 with urinary obstruction, AKI, felt week initially but declined HHPT; pt/husband say progressively more weak over subsequent months, more difficulty with walking, husband has now asked her to use a cane. Pt reports 5 falls, 4 of which were outside on natural surface 5th trying to clear a threshold (posterior fall buttock, back, hit  head). Does get dizzy, but not frequently, not related to falls often.    How long can you sit comfortably? Not a limitation    How long can you stand comfortably? Not a limitation    How long can you walk comfortably? Mostly walks household distances, but can tolerate occasional store trips with buggy support.    Currently in Pain? No/denies               Objective measurements completed on examination: See above findings.    TODAY'S TREATMENT: 09/05/23 -  BP (sitting) right UE at rest at beginning of visit= 167/64  mmHg and HR= 64 bpm BP (Standing) right UE at rest at beginning of visit= 149/77 mmHg  Therex:  Sit to stand without UE Support x 10 reps  Side stepping down and back in // bars x 5 (counting steps 5-7) Lunge squat down and back x 3 (VC for technique) Calf raises x 15 reps [employer] each LE x 15 reps   NMR:  Dynamic high knee march in // bars with 1 UE support down to no UE support down and back x 6 Tandem gait down and back in // bars- no UE support x 5    PT Education -     Education Details Transition to longer walking outside of home.                          PT Short Term Goals -        PT SHORT TERM GOAL #1   Title compliant with balance HEP    Time 4    Period Weeks    Status Achieved    Target Date 08/12/23      PT SHORT TERM GOAL #2   Title Improved overground gait speed over 526ft to improve ability to access limited community distances safely.    Baseline eval: 337ft at 0.54m/s; 618ft s device 0.47m/s.   Time 4    Period Weeks    Status Achieved     Target Date 08/12/23               PT Long Term Goals -       PT LONG TERM GOAL #1   Title FOTO score improvement >12 points to indicate improve confidence in balance.    Baseline 07/19/2023=43; 08/22/23: 51    Time 8    Period Weeks    Status On going     Target Date 09/09/23      PT LONG TERM GOAL #2   Title Improvement in standardized balance assessment by MCID  or greater.    Baseline 07/19/2023= BERG- 35/56; 08/22/23: 44   Time 8    Period Weeks    Status Achieved     Target Date 09/09/23      PT LONG TERM GOAL #3  Title Pt to demonstrate 5xSTS hands free from standard height surface to indicated improved BLE power.    Baseline 07/19/2023= 31.07 sec without UE Support; 08/22/23: 19.15sec (hands on thighs)    Time 8    Period Weeks    Status Progressing    Target Date 09/09/23      PT LONG TERM GOAL #4   Title Pt to demonstrate performance of 2056ft AMB >0.5m/s without increased pain and without LOB at modI level or less to restore ability to access comunity distances for IADL.    Baseline 08/22/23: 660ft in 4:02 (0.66m/s); 08/25/2023= 1200 in .    Time 8    Period Weeks    Status Ongong    Target Date 09/09/23                 Plan -     Clinical Impression Statement Patient able to progress to standing - asymptomatic decrease in Blood pressure. She was challenged with some standing activities- including coordination- improved with verbal cues and visual demonstration. She demo improved overall sit to stand without UE support.  Pt will benefit from skilled PT services to address and improve her functional strength and mobility to assist in return to previous level of function.    Personal Factors and Comorbidities Age;Fitness;Behavior Pattern;Past/Current Experience    Examination-Activity Limitations Locomotion Level;Transfers;Lift;Stairs    Examination-Participation Restrictions Meal Prep;Cleaning;Community Activity;Driving;Interpersonal Relationship;Yard Work    Stability/Clinical Decision Making Stable/Uncomplicated    Clinical Decision Making Moderate    Rehab Potential Good    PT Frequency 2x / week    PT Duration 8 weeks    PT Treatment/Interventions ADLs/Self Care Home Management;Gait training;Stair training;Functional mobility training;Neuromuscular re-education;Balance training;Therapeutic exercise;Therapeutic  activities;Passive range of motion;Manual techniques    PT Next Visit Plan Check vitals,  continue with balance and therex vor home exercises.        PT Home Exercise Plan asked at eval to check and log BP twice daily,   Access Code: [email or web address] URL: [email or web address] Date: 07/21/2023 Prepared by: Grier Rocher  Exercises - Sit to Stand with Armchair  - 1 x daily - 5 x weekly - 2 sets - 6 reps - Standing Hip Abduction with Counter Support  - 1 x daily - 7 x weekly - 3 sets - 10 reps - Seated Hip Abduction with Resistance  - 1 x daily - 7 x weekly - 3 sets - 10 reps - Seated March with Resistance  - 1 x daily - 7 x weekly - 3 sets - 10 reps   Consulted and Agree with Plan of Care Patient;Family member/caregiver             Patient will benefit from skilled therapeutic intervention in order to improve the following deficits and impairments:   balance impairments, endurance deficits, reduced strength,   Visit Diagnosis: Muscle weakness (generalized)  Difficulty in walking, not elsewhere classified  Unsteadiness on feet  Repeated falls     Problem List Patient Active Problem List   Diagnosis Date Noted   Obstructive nephropathy 01/13/2023   Acute pyelonephritis 01/13/2023   AKI (acute kidney injury) (HCC) 01/13/2023   Hypokalemia 01/13/2023   Diarrhea 01/13/2023   Thoracic aortic aneurysm without rupture (HCC) 11/09/2022   Hematuria 11/05/2022   Polyp of esophagus 11/02/2022   Iron deficiency anemia 07/28/2022   Positive colorectal cancer screening using Cologuard test    Abnormal ECG 03/31/2021   Acute non-recurrent sinusitis 11/30/2020   Acute otitis media 11/30/2020  Benign essential HTN 10/31/2018   Frequent PVCs 09/08/2017   A-fib (HCC) 08/27/2015   Clinical depression 08/27/2015   Blood pressure elevated 08/27/2015   Polypharmacy 08/27/2015   Esophagitis, reflux 08/27/2015   Fatigue 08/27/2015   Acid reflux 08/27/2015   HLD  (hyperlipidemia) 08/27/2015   Adult hypothyroidism 08/27/2015   Adaptive colitis 08/27/2015   Malaise and fatigue 08/27/2015   Mild major depression (HCC) 08/27/2015   Polymyalgia rheumatica (HCC) 08/27/2015   Cranial arteritis (HCC) 08/27/2015   Avitaminosis D 08/27/2015    10:20 AM, 09/05/23 Louis Meckel, PT Physical Therapist - [facility]  Outpatient Physical Therapy- Main Campus [phone]

## 2023-09-08 ENCOUNTER — Ambulatory Visit: Payer: Medicare Other

## 2023-09-08 DIAGNOSIS — I7123 Aneurysm of the descending thoracic aorta, without rupture: Secondary | ICD-10-CM | POA: Diagnosis not present

## 2023-09-08 DIAGNOSIS — E782 Mixed hyperlipidemia: Secondary | ICD-10-CM | POA: Diagnosis not present

## 2023-09-08 DIAGNOSIS — M6281 Muscle weakness (generalized): Secondary | ICD-10-CM | POA: Diagnosis not present

## 2023-09-08 DIAGNOSIS — R296 Repeated falls: Secondary | ICD-10-CM | POA: Diagnosis not present

## 2023-09-08 DIAGNOSIS — I480 Paroxysmal atrial fibrillation: Secondary | ICD-10-CM | POA: Diagnosis not present

## 2023-09-08 DIAGNOSIS — R001 Bradycardia, unspecified: Secondary | ICD-10-CM | POA: Diagnosis not present

## 2023-09-08 DIAGNOSIS — R2681 Unsteadiness on feet: Secondary | ICD-10-CM | POA: Diagnosis not present

## 2023-09-08 DIAGNOSIS — R262 Difficulty in walking, not elsewhere classified: Secondary | ICD-10-CM | POA: Diagnosis not present

## 2023-09-08 DIAGNOSIS — I10 Essential (primary) hypertension: Secondary | ICD-10-CM | POA: Diagnosis not present

## 2023-09-08 NOTE — Therapy (Signed)
[facility] Outpatient Rehabilitation at [facility] [address] [place], [place], [address] Phone: [phone]   Fax:  [phone]  Outpatient Physical Therapy Treatment/RECERT    Patient Details  Name: Tara Marquez MRN: [id number] Date of Birth: May 31, 1941 Referring Provider (PT): Julieanne Manson MD (PCP)   Encounter Date: 09/08/2023   PT End of Session - 09/08/23 1317     Visit Number 13    Number of Visits 37    Date for PT Re-Evaluation 12/01/23    Authorization Type Medicare A&B primary with BCBS supplemental secondary    Authorization Time Period --    Progress Note Due on Visit 10    PT Start Time 1315    PT Stop Time 1359    PT Time Calculation (min) 44 min    Equipment Utilized During Treatment Gait belt    Activity Tolerance Patient tolerated treatment well    Behavior During Therapy WFL for tasks assessed/performed                 Past Medical History:  Diagnosis Date   Acute pyelonephritis    Adaptive colitis    Anemia    Aortic atherosclerosis (HCC)    Atrial fibrillation and flutter (HCC)    a.) CHA2DS2VASc = 5 (age x 2, sex, HTN, vascular disease history);  b.) rate/rhythm maintained on oral amiodarone + metoprolol succinate; chronically anticoagulated with dose reduced apixaban   CAD (coronary artery disease)    Depression    Descending thoracic aortic aneurysm (HCC)    a.) CT chest 09/29/2022: 4.9 cm; b.) CT abd 01/13/2023: 4.9 cm   Frequent PVCs    GERD (gastroesophageal reflux disease)    Hepatic steatosis    History of 2019 novel coronavirus disease (COVID-19) 12/04/2021   Hyperlipidemia    Hypertension    Hypokalemia    Hypothyroidism    Long term current use of amiodarone    Long term current use of anticoagulant    a.) dose reduced apixaban   Nephrolithiasis    Osteopenia    Polymyalgia rheumatica (HCC)    Skin cancer    Splenomegaly    Temporal arteritis (HCC)    Vitamin D deficiency     Past  Surgical History:  Procedure Laterality Date   ABDOMINAL HYSTERECTOMY  03/1971   Status Post Hysterectomy   CATARACT EXTRACTION Left    COLONOSCOPY     2010   COLONOSCOPY WITH PROPOFOL N/A 01/12/2022   Procedure: COLONOSCOPY WITH PROPOFOL;  Surgeon: Midge Minium, MD;  Location: [facility] ENDOSCOPY;  Service: Endoscopy;  Laterality: N/A;   CYSTOSCOPY WITH RETROGRADE PYELOGRAM, URETEROSCOPY AND STENT PLACEMENT Left 01/13/2023   Procedure: CYSTOSCOPY WITH RETROGRADE PYELOGRAM, URETEROSCOPY AND STENT PLACEMENT;  Surgeon: Riki Altes, MD;  Location: [facility] ORS;  Service: Urology;  Laterality: Left;   CYSTOSCOPY/URETEROSCOPY/HOLMIUM LASER/STENT PLACEMENT Left 02/08/2023   Procedure: CYSTOSCOPY/URETEROSCOPY/HOLMIUM LASER/STENT EXCHANGE;  Surgeon: Riki Altes, MD;  Location: [facility] ORS;  Service: Urology;  Laterality: Left;   ESOPHAGOGASTRODUODENOSCOPY N/A 11/02/2022   Procedure: ESOPHAGOGASTRODUODENOSCOPY (EGD);  Surgeon: Midge Minium, MD;  Location: [facility] ENDOSCOPY;  Service: Endoscopy;  Laterality: N/A;   EYE SURGERY     GIVENS CAPSULE STUDY N/A 12/22/2022   Procedure: GIVENS CAPSULE STUDY;  Surgeon: Midge Minium, MD;  Location: [facility] ENDOSCOPY;  Service: Endoscopy;  Laterality: N/A;   TONSILLECTOMY     TONSILLECTOMY AND ADENOIDECTOMY  1951   WISDOM TOOTH EXTRACTION  1970       Subjective Assessment  Subjective Pt reports had a good checkup with her Cardiologist.    Pertinent History Tara Marquez is an 82yoF who is referred by PCP due to gradual progressive weakness since hospital admission in February 2024. Pt presents to PT eval with husband. Pt reports being in hospital for ~ a week in Feb 2024 with urinary obstruction, AKI, felt week initially but declined HHPT; pt/husband say progressively more weak over subsequent months, more difficulty with walking, husband has now asked her to use a cane. Pt reports 5 falls, 4 of which were outside on natural surface 5th trying to clear a threshold  (posterior fall buttock, back, hit head). Does get dizzy, but not frequently, not related to falls often.    How long can you sit comfortably? Not a limitation    How long can you stand comfortably? Not a limitation    How long can you walk comfortably? Mostly walks household distances, but can tolerate occasional store trips with buggy support.    Currently in Pain? No/denies               Objective measurements completed on examination: See above findings.    TODAY'S TREATMENT: 09/08/23 - Physical therapy treatment session today consisted of completing assessment of goals and administration of testing as demonstrated and documented in flow sheet, treatment, and goals section of this note. Addition treatments may be found below.   Therex:  Sitting hip march x 20 reps each Sitting knee ext x 20 reps each LE Sit to stand without UE Support x 10 reps       PT Education -     Education Details Transition to longer walking outside of home.                          PT Short Term Goals -        PT SHORT TERM GOAL #1   Title compliant with balance HEP    Time 4    Period Weeks    Status Achieved    Target Date 08/12/23      PT SHORT TERM GOAL #2   Title Improved overground gait speed over 561ft to improve ability to access limited community distances safely.    Baseline eval: 354ft at 0.42m/s; 61ft s device 0.11m/s.   Time 4    Period Weeks    Status Achieved     Target Date 08/12/23               PT Long Term Goals -       PT LONG TERM GOAL #1   Title FOTO score improvement >12 points to indicate improve confidence in balance.    Baseline 07/19/2023=43; 08/22/23: 51; 09/08/2023 =will assess next visit   Time 12   Period Weeks    Status On going     Target Date 12/01/2023     PT LONG TERM GOAL #2   Title Improvement in standardized balance assessment by MCID or greater.    Baseline 07/19/2023= BERG- 35/56; 08/22/23: 44   Time 8    Period Weeks     Status Achieved     Target Date 09/09/23      PT LONG TERM GOAL #3   Title Pt to demonstrate 5xSTS hands free from standard height surface to indicated improved BLE power.    Baseline 07/19/2023= 31.07 sec without UE Support; 08/22/23: 19.15sec (hands on thighs); 09/08/2023= 16.42 sec    Time 8  Period Weeks    Status MET   Target Date 09/09/23      PT LONG TERM GOAL #4   Title Pt to demonstrate performance of 2048ft AMB >0.12m/s without increased pain and without LOB at modI level or less to restore ability to access comunity distances for IADL.    Baseline 08/22/23: 644ft in 4:02 (0.90m/s); 08/25/2023= 1200 in . 9/12= 0.62 m/s   Time 8    Period Weeks    Status Ongoing    Target Date 12/01/23         PT LONG TERM GOAL #5  Title Pt will improve BERG by at least 3 points in order to demonstrate clinically significant improvement in balance.   Baseline 09/08/2023= 46/56  Time 12  Period Weeks   Status NEW  Target Date 12/01/23       PT LONG TERM GOAL #6  Title  Pt will decrease 5TSTS by at least 3 seconds in order to demonstrate clinically significant improvement in LE strength.  Baseline 09/08/2023= 16.42 sec   Time 12  Period Weeks   Status  NEW  Target Date 12/01/23         Plan -     Clinical Impression Statement Patient presents with good motivation for recert visit. She has demo good progress overall- improvement in balance and functional LE strength. She continues to present with overall weakness and despite improved scores- still at significant risk of falling. Patient's condition has the potential to improve in response to therapy. Maximum improvement is yet to be obtained. The anticipated improvement is attainable and reasonable in a generally predictable time.  Pt will benefit from skilled PT services to address and improve her functional strength and mobility to assist in return to previous level of function.    Personal Factors and Comorbidities  Age;Fitness;Behavior Pattern;Past/Current Experience    Examination-Activity Limitations Locomotion Level;Transfers;Lift;Stairs    Examination-Participation Restrictions Meal Prep;Cleaning;Community Activity;Driving;Interpersonal Relationship;Yard Work    Stability/Clinical Decision Making Stable/Uncomplicated    Clinical Decision Making Moderate    Rehab Potential Good    PT Frequency 1-2x / week    PT Duration 12 weeks    PT Treatment/Interventions ADLs/Self Care Home Management;Gait training;Stair training;Functional mobility training;Neuromuscular re-education;Balance training;Therapeutic exercise;Therapeutic activities;Passive range of motion;Manual techniques    PT Next Visit Plan Check vitals,  continue with balance and therex vor home exercises. Update FOTO Score       PT Home Exercise Plan asked at eval to check and log BP twice daily,   Access Code: [email or web address] URL: [email or web address] Date: 07/21/2023 Prepared by: Grier Rocher  Exercises - Sit to Stand with Armchair  - 1 x daily - 5 x weekly - 2 sets - 6 reps - Standing Hip Abduction with Counter Support  - 1 x daily - 7 x weekly - 3 sets - 10 reps - Seated Hip Abduction with Resistance  - 1 x daily - 7 x weekly - 3 sets - 10 reps - Seated March with Resistance  - 1 x daily - 7 x weekly - 3 sets - 10 reps   Consulted and Agree with Plan of Care Patient;Family member/caregiver             Patient will benefit from skilled therapeutic intervention in order to improve the following deficits and impairments:   balance impairments, endurance deficits, reduced strength,   Visit Diagnosis: Muscle weakness (generalized)  Difficulty in walking, not elsewhere classified  Unsteadiness on feet  Repeated falls  Problem List Patient Active Problem List   Diagnosis Date Noted   Obstructive nephropathy 01/13/2023   Acute pyelonephritis 01/13/2023   AKI (acute kidney injury) (HCC) 01/13/2023    Hypokalemia 01/13/2023   Diarrhea 01/13/2023   Thoracic aortic aneurysm without rupture (HCC) 11/09/2022   Hematuria 11/05/2022   Polyp of esophagus 11/02/2022   Iron deficiency anemia 07/28/2022   Positive colorectal cancer screening using Cologuard test    Abnormal ECG 03/31/2021   Acute non-recurrent sinusitis 11/30/2020   Acute otitis media 11/30/2020   Benign essential HTN 10/31/2018   Frequent PVCs 09/08/2017   A-fib (HCC) 08/27/2015   Clinical depression 08/27/2015   Blood pressure elevated 08/27/2015   Polypharmacy 08/27/2015   Esophagitis, reflux 08/27/2015   Fatigue 08/27/2015   Acid reflux 08/27/2015   HLD (hyperlipidemia) 08/27/2015   Adult hypothyroidism 08/27/2015   Adaptive colitis 08/27/2015   Malaise and fatigue 08/27/2015   Mild major depression (HCC) 08/27/2015   Polymyalgia rheumatica (HCC) 08/27/2015   Cranial arteritis (HCC) 08/27/2015   Avitaminosis D 08/27/2015    3:14 PM, 09/08/23 Louis Meckel, PT Physical Therapist - [facility]  Outpatient Physical Therapy- Main Campus [phone]

## 2023-09-12 ENCOUNTER — Ambulatory Visit
Admission: RE | Admit: 2023-09-12 | Discharge: 2023-09-12 | Disposition: A | Discharge status: Home / Self Care | Payer: Medicare Other | Source: Ambulatory Visit | Attending: Urology | Admitting: Urology

## 2023-09-12 ENCOUNTER — Ambulatory Visit (INDEPENDENT_AMBULATORY_CARE_PROVIDER_SITE_OTHER): Payer: Medicare Other | Admitting: Urology

## 2023-09-12 ENCOUNTER — Encounter: Payer: Self-pay | Admitting: Urology

## 2023-09-12 VITALS — BP 135/74 | HR 80 | Ht 65.0 in | Wt 121.0 lb

## 2023-09-12 DIAGNOSIS — N201 Calculus of ureter: Secondary | ICD-10-CM

## 2023-09-12 DIAGNOSIS — N200 Calculus of kidney: Secondary | ICD-10-CM

## 2023-09-12 DIAGNOSIS — K5900 Constipation, unspecified: Secondary | ICD-10-CM | POA: Diagnosis not present

## 2023-09-12 NOTE — Progress Notes (Signed)
I, Maysun Anabel Bene, acting as a scribe for Riki Altes, MD., have documented all relevant documentation on the behalf of Riki Altes, MD, as directed by Riki Altes, MD while in the presence of Riki Altes, MD.  09/12/2023 10:56 AM   Brock Ra 03-27-1941 [id number]  Referring provider: Bosie Clos, MD [address] [place],  [place] [address]  Chief Complaint  Patient presents with   Nephrolithiasis   Urologic history: 1. Nephrolithiasis Left ureteral stent placement for obstructing 7mm left distal ureteral calculus with sepsis 01/13/23.  Ureteroscopic stone removal 02/08/23. Stone analysis CaOxmono/CaOxdi 80/20.  2mm non-obstructing right renal calculus on CT.   HPI: Tara Marquez is a 82 y.o. female presents for a 6 month follow-up.   Since her last visit, she denies flank, abdominal, or pelvic pain.  No bothersome LUTS. Denies dysuria or gross hematuria.   PMH: Past Medical History:  Diagnosis Date   Acute pyelonephritis    Adaptive colitis    Anemia    Aortic atherosclerosis (HCC)    Atrial fibrillation and flutter (HCC)    a.) CHA2DS2VASc = 5 (age x 2, sex, HTN, vascular disease history);  b.) rate/rhythm maintained on oral amiodarone + metoprolol succinate; chronically anticoagulated with dose reduced apixaban   CAD (coronary artery disease)    Depression    Descending thoracic aortic aneurysm (HCC)    a.) CT chest 09/29/2022: 4.9 cm; b.) CT abd 01/13/2023: 4.9 cm   Frequent PVCs    GERD (gastroesophageal reflux disease)    Hepatic steatosis    History of 2019 novel coronavirus disease (COVID-19) 12/04/2021   Hyperlipidemia    Hypertension    Hypokalemia    Hypothyroidism    Long term current use of amiodarone    Long term current use of anticoagulant    a.) dose reduced apixaban   Nephrolithiasis    Osteopenia    Polymyalgia rheumatica (HCC)    Skin cancer    Splenomegaly    Temporal arteritis (HCC)    Vitamin D deficiency      Surgical History: Past Surgical History:  Procedure Laterality Date   ABDOMINAL HYSTERECTOMY  03/1971   Status Post Hysterectomy   CATARACT EXTRACTION Left    COLONOSCOPY     2010   COLONOSCOPY WITH PROPOFOL N/A 01/12/2022   Procedure: COLONOSCOPY WITH PROPOFOL;  Surgeon: Midge Minium, MD;  Location: [facility] ENDOSCOPY;  Service: Endoscopy;  Laterality: N/A;   CYSTOSCOPY WITH RETROGRADE PYELOGRAM, URETEROSCOPY AND STENT PLACEMENT Left 01/13/2023   Procedure: CYSTOSCOPY WITH RETROGRADE PYELOGRAM, URETEROSCOPY AND STENT PLACEMENT;  Surgeon: Riki Altes, MD;  Location: [facility] ORS;  Service: Urology;  Laterality: Left;   CYSTOSCOPY/URETEROSCOPY/HOLMIUM LASER/STENT PLACEMENT Left 02/08/2023   Procedure: CYSTOSCOPY/URETEROSCOPY/HOLMIUM LASER/STENT EXCHANGE;  Surgeon: Riki Altes, MD;  Location: [facility] ORS;  Service: Urology;  Laterality: Left;   ESOPHAGOGASTRODUODENOSCOPY N/A 11/02/2022   Procedure: ESOPHAGOGASTRODUODENOSCOPY (EGD);  Surgeon: Midge Minium, MD;  Location: [facility] ENDOSCOPY;  Service: Endoscopy;  Laterality: N/A;   EYE SURGERY     GIVENS CAPSULE STUDY N/A 12/22/2022   Procedure: GIVENS CAPSULE STUDY;  Surgeon: Midge Minium, MD;  Location: [facility] ENDOSCOPY;  Service: Endoscopy;  Laterality: N/A;   TONSILLECTOMY     TONSILLECTOMY AND ADENOIDECTOMY  1951   WISDOM TOOTH EXTRACTION  1970    Home Medications:  Allergies as of 09/12/2023       Reactions   Sulfa Antibiotics Hives   Amoxicillin Rash   Penicillins Rash  Medication List        Accurate as of September 12, 2023 10:56 AM. If you have any questions, ask your nurse or doctor.          amiodarone 200 MG tablet Commonly known as: Pacerone Take 1 tablet (200 mg total) by mouth 2 (two) times daily.   apixaban 2.5 MG Tabs tablet Commonly known as: ELIQUIS Take 1 tablet (2.5 mg total) by mouth 2 (two) times daily.   atorvastatin 40 MG tablet Commonly known as: LIPITOR Take 1 tablet (40 mg total) by  mouth daily at 6 PM. What changed: when to take this   levothyroxine 75 MCG tablet Commonly known as: SYNTHROID Take 1 tablet (75 mcg total) by mouth daily. What changed: when to take this   lisinopril 10 MG tablet Commonly known as: ZESTRIL Take 10 mg by mouth daily.   metoprolol succinate 25 MG 24 hr tablet Commonly known as: TOPROL-XL Take 25 mg by mouth at bedtime.   nitroGLYCERIN 0.4 MG SL tablet Commonly known as: NITROSTAT Place 1 tablet (0.4 mg total) under the tongue every 5 (five) minutes as needed for chest pain.   ondansetron 4 MG tablet Commonly known as: ZOFRAN Take 4 mg by mouth every morning.   sertraline 100 MG tablet Commonly known as: ZOLOFT TAKE 1 & 1/2 (ONE & ONE-HALF) TABLETS BY MOUTH ONCE DAILY NEED  TO  SCHEDULE  AN  APPOINTMENT What changed:  how much to take how to take this when to take this        Allergies:  Allergies  Allergen Reactions   Sulfa Antibiotics Hives   Amoxicillin Rash   Penicillins Rash    Family History: Family History  Problem Relation Age of Onset   Cancer Sister        Has had Breast Cancer and is in remission   Breast cancer Sister 57   Congenital heart disease Son     Social History:  reports that she has never smoked. She has never used smokeless tobacco. She reports that she does not drink alcohol and does not use drugs.   Physical Exam: BP 135/74   Pulse 80   Ht 5\' 5"  (1.651 m)   Wt 121 lb (54.9 kg)   BMI 20.14 kg/m   Constitutional:  Alert and oriented, No acute distress. HEENT: [place] AT Respiratory: Normal respiratory effort, no increased work of breathing. Psychiatric: Normal mood and affect.  Assessment & Plan:    1. Nephrolithiasis Status post uteroscopic stone removal 01/2023.  Non-obstructing right renal calculus on CT.  KUB today.  Follow-up 1 year with KUB.  Instructed to call earlier for renal colic removal or unexplained fever.   I have reviewed the above documentation for accuracy  and completeness, and I agree with the above.   Riki Altes, MD  [facility] [address] [place], [place] [address] [phone]

## 2023-09-13 ENCOUNTER — Encounter: Payer: Self-pay | Admitting: Urology

## 2023-09-14 ENCOUNTER — Ambulatory Visit: Payer: Medicare Other | Admitting: Physical Therapy

## 2023-09-14 DIAGNOSIS — Z0000 Encounter for general adult medical examination without abnormal findings: Secondary | ICD-10-CM | POA: Diagnosis not present

## 2023-09-14 DIAGNOSIS — M6281 Muscle weakness (generalized): Secondary | ICD-10-CM | POA: Diagnosis not present

## 2023-09-14 DIAGNOSIS — R262 Difficulty in walking, not elsewhere classified: Secondary | ICD-10-CM

## 2023-09-14 DIAGNOSIS — I10 Essential (primary) hypertension: Secondary | ICD-10-CM | POA: Diagnosis not present

## 2023-09-14 DIAGNOSIS — N179 Acute kidney failure, unspecified: Secondary | ICD-10-CM | POA: Diagnosis not present

## 2023-09-14 DIAGNOSIS — N138 Other obstructive and reflux uropathy: Secondary | ICD-10-CM | POA: Diagnosis not present

## 2023-09-14 DIAGNOSIS — R296 Repeated falls: Secondary | ICD-10-CM

## 2023-09-14 DIAGNOSIS — F320 Major depressive disorder, single episode, mild: Secondary | ICD-10-CM | POA: Diagnosis not present

## 2023-09-14 DIAGNOSIS — D509 Iron deficiency anemia, unspecified: Secondary | ICD-10-CM | POA: Diagnosis not present

## 2023-09-14 DIAGNOSIS — E039 Hypothyroidism, unspecified: Secondary | ICD-10-CM | POA: Diagnosis not present

## 2023-09-14 DIAGNOSIS — Z23 Encounter for immunization: Secondary | ICD-10-CM | POA: Diagnosis not present

## 2023-09-14 DIAGNOSIS — N200 Calculus of kidney: Secondary | ICD-10-CM | POA: Diagnosis not present

## 2023-09-14 DIAGNOSIS — R2681 Unsteadiness on feet: Secondary | ICD-10-CM

## 2023-09-14 DIAGNOSIS — I7123 Aneurysm of the descending thoracic aorta, without rupture: Secondary | ICD-10-CM | POA: Diagnosis not present

## 2023-09-14 NOTE — Therapy (Signed)
[facility] [address] [place], [place], [address] Phone: [phone]   Fax:  [phone]  Outpatient Physical Therapy Treatment/RECERT    Patient Details  Name: Tara Marquez MRN: [id number] Date of Birth: 06/22/1941 Referring Provider (PT): Julieanne Manson MD (PCP)   Encounter Date: 09/14/2023   PT End of Session - 09/14/23 1013     Visit Number 14    Number of Visits 37    Date for PT Re-Evaluation 12/01/23    Authorization Type Medicare A&B primary with BCBS supplemental secondary    Progress Note Due on Visit 20    PT Start Time 1014    PT Stop Time 1100    PT Time Calculation (min) 46 min    Equipment Utilized During Treatment Gait belt    Activity Tolerance Patient tolerated treatment well    Behavior During Therapy WFL for tasks assessed/performed                 Past Medical History:  Diagnosis Date   Acute pyelonephritis    Adaptive colitis    Anemia    Aortic atherosclerosis (HCC)    Atrial fibrillation and flutter (HCC)    a.) CHA2DS2VASc = 5 (age x 2, sex, HTN, vascular disease history);  b.) rate/rhythm maintained on oral amiodarone + metoprolol succinate; chronically anticoagulated with dose reduced apixaban   CAD (coronary artery disease)    Depression    Descending thoracic aortic aneurysm (HCC)    a.) CT chest 09/29/2022: 4.9 cm; b.) CT abd 01/13/2023: 4.9 cm   Frequent PVCs    GERD (gastroesophageal reflux disease)    Hepatic steatosis    History of 2019 novel coronavirus disease (COVID-19) 12/04/2021   Hyperlipidemia    Hypertension    Hypokalemia    Hypothyroidism    Long term current use of amiodarone    Long term current use of anticoagulant    a.) dose reduced apixaban   Nephrolithiasis    Osteopenia    Polymyalgia rheumatica (HCC)    Skin cancer    Splenomegaly    Temporal arteritis (HCC)    Vitamin D deficiency     Past Surgical History:  Procedure  Laterality Date   ABDOMINAL HYSTERECTOMY  03/1971   Status Post Hysterectomy   CATARACT EXTRACTION Left    COLONOSCOPY     2010   COLONOSCOPY WITH PROPOFOL N/A 01/12/2022   Procedure: COLONOSCOPY WITH PROPOFOL;  Surgeon: Midge Minium, MD;  Location: [facility] ENDOSCOPY;  Service: Endoscopy;  Laterality: N/A;   CYSTOSCOPY WITH RETROGRADE PYELOGRAM, URETEROSCOPY AND STENT PLACEMENT Left 01/13/2023   Procedure: CYSTOSCOPY WITH RETROGRADE PYELOGRAM, URETEROSCOPY AND STENT PLACEMENT;  Surgeon: Riki Altes, MD;  Location: [facility] ORS;  Service: Urology;  Laterality: Left;   CYSTOSCOPY/URETEROSCOPY/HOLMIUM LASER/STENT PLACEMENT Left 02/08/2023   Procedure: CYSTOSCOPY/URETEROSCOPY/HOLMIUM LASER/STENT EXCHANGE;  Surgeon: Riki Altes, MD;  Location: [facility] ORS;  Service: Urology;  Laterality: Left;   ESOPHAGOGASTRODUODENOSCOPY N/A 11/02/2022   Procedure: ESOPHAGOGASTRODUODENOSCOPY (EGD);  Surgeon: Midge Minium, MD;  Location: [facility] ENDOSCOPY;  Service: Endoscopy;  Laterality: N/A;   EYE SURGERY     GIVENS CAPSULE STUDY N/A 12/22/2022   Procedure: GIVENS CAPSULE STUDY;  Surgeon: Midge Minium, MD;  Location: [facility] ENDOSCOPY;  Service: Endoscopy;  Laterality: N/A;   TONSILLECTOMY     TONSILLECTOMY AND ADENOIDECTOMY  1951   WISDOM TOOTH EXTRACTION  1970       Subjective Assessment     Subjective Pt reports that  she has been feeling a little out of this week and slightly nauseous. Mild improvement today.    Pertinent History Tara Marquez is an 82yoF who is referred by PCP due to gradual progressive weakness since hospital admission in February 2024. Pt presents to PT eval with husband. Pt reports being in hospital for ~ a week in Feb 2024 with urinary obstruction, AKI, felt week initially but declined HHPT; pt/husband say progressively more weak over subsequent months, more difficulty with walking, husband has now asked her to use a cane. Pt reports 5 falls, 4 of which were outside on natural surface 5th trying  to clear a threshold (posterior fall buttock, back, hit head). Does get dizzy, but not frequently, not related to falls often.    How long can you sit comfortably? Not a limitation    How long can you stand comfortably? Not a limitation    How long can you walk comfortably? Mostly walks household distances, but can tolerate occasional store trips with buggy support.    Currently in Pain? No/denies               Objective measurements completed on examination: See above findings.    TODAY'S TREATMENT: 09/14/23 - PT assessed VS at start of PT treatment:  BP: 142/62 HR: 69 SpO2: 100%  Gait without AD x 329ft with CGA for safety mild foot drag with  Side stepping RTB band at knees x 2 laps pt rates low difficulty, x 3 laps   Tandem stance 4 x 15 sec bil  Standing on wedge  2x30 sec up hill and 2 x 30 sec downhill   Heel raise 2 x 10 from wedge Toe raise standing on wedge 2 x 10   Rocker board:  AP control 2 x 30 sec AP rocking x 20 each  Lateral control x 30 sec  Lateral rocks 2 x 20   Cues for COM control and use of ankle strategy to prevent anterior/posterior LOB  Sit<>stand 2x 10 without UE support       PT Education -     Education Details Transition to longer walking outside of home.                          PT Short Term Goals -        PT SHORT TERM GOAL #1   Title compliant with balance HEP    Time 4    Period Weeks    Status Achieved    Target Date 08/12/23      PT SHORT TERM GOAL #2   Title Improved overground gait speed over 542ft to improve ability to access limited community distances safely.    Baseline eval: 362ft at 0.67m/s; 636ft s device 0.63m/s.   Time 4    Period Weeks    Status Achieved     Target Date 08/12/23               PT Long Term Goals -       PT LONG TERM GOAL #1   Title FOTO score improvement >12 points to indicate improve confidence in balance.    Baseline 07/19/2023=43; 08/22/23: 51; 09/08/2023 =will assess  next visit   Time 12   Period Weeks    Status On going     Target Date 12/01/2023     PT LONG TERM GOAL #2   Title Improvement in standardized balance assessment by MCID or greater.  Baseline 07/19/2023= BERG- 35/56; 08/22/23: 44   Time 8    Period Weeks    Status Achieved     Target Date 09/09/23      PT LONG TERM GOAL #3   Title Pt to demonstrate 5xSTS hands free from standard height surface to indicated improved BLE power.    Baseline 07/19/2023= 31.07 sec without UE Support; 08/22/23: 19.15sec (hands on thighs); 09/08/2023= 16.42 sec    Time 8    Period Weeks    Status MET   Target Date 09/09/23      PT LONG TERM GOAL #4   Title Pt to demonstrate performance of 206ft AMB >0.38m/s without increased pain and without LOB at modI level or less to restore ability to access comunity distances for IADL.    Baseline 08/22/23: 612ft in 4:02 (0.52m/s); 08/25/2023= 1200 in . 9/12= 0.62 m/s   Time 8    Period Weeks    Status Ongoing    Target Date 12/01/23         PT LONG TERM GOAL #5  Title Pt will improve BERG by at least 3 points in order to demonstrate clinically significant improvement in balance.   Baseline 09/08/2023= 46/56  Time 12  Period Weeks   Status NEW  Target Date 12/01/23       PT LONG TERM GOAL #6  Title  Pt will decrease 5TSTS by at least 3 seconds in order to demonstrate clinically significant improvement in LE strength.  Baseline 09/08/2023= 16.42 sec   Time 12  Period Weeks   Status  NEW  Target Date 12/01/23         Plan -     Clinical Impression Statement Patient presents with good motivation on this day. PT treatment focused on BLE strength and improved use of ankle strategy to correct AP and lateral LOB. Pt demonstrated significant improvement in ankle strategy to correct LOB at end of session. Pt will benefit from skilled PT services to address and improve her functional strength and mobility to assist in return to previous level of function.     Personal Factors and Comorbidities Age;Fitness;Behavior Pattern;Past/Current Experience    Examination-Activity Limitations Locomotion Level;Transfers;Lift;Stairs    Examination-Participation Restrictions Meal Prep;Cleaning;Community Activity;Driving;Interpersonal Relationship;Yard Work    Stability/Clinical Decision Making Stable/Uncomplicated    Clinical Decision Making Moderate    Rehab Potential Good    PT Frequency 1-2x / week    PT Duration 12 weeks    PT Treatment/Interventions ADLs/Self Care Home Management;Gait training;Stair training;Functional mobility training;Neuromuscular re-education;Balance training;Therapeutic exercise;Therapeutic activities;Passive range of motion;Manual techniques    PT Next Visit Plan Check vitals,  continue with balance and therex vor home exercises. Update FOTO Score       PT Home Exercise Plan asked at eval to check and log BP twice daily,   Access Code: [email or web address] URL: [email or web address] Date: 07/21/2023 Prepared by: Grier Rocher  Exercises - Sit to Stand with Armchair  - 1 x daily - 5 x weekly - 2 sets - 6 reps - Standing Hip Abduction with Counter Support  - 1 x daily - 7 x weekly - 3 sets - 10 reps - Seated Hip Abduction with Resistance  - 1 x daily - 7 x weekly - 3 sets - 10 reps - Seated March with Resistance  - 1 x daily - 7 x weekly - 3 sets - 10 reps   Consulted and Agree with Plan of Care Patient;Family member/caregiver  Patient will benefit from skilled therapeutic intervention in order to improve the following deficits and impairments:   balance impairments, endurance deficits, reduced strength,   Visit Diagnosis: Muscle weakness (generalized)  Difficulty in walking, not elsewhere classified  Unsteadiness on feet  Repeated falls     Problem List Patient Active Problem List   Diagnosis Date Noted   Obstructive nephropathy 01/13/2023   Acute pyelonephritis 01/13/2023   AKI (acute  kidney injury) (HCC) 01/13/2023   Hypokalemia 01/13/2023   Diarrhea 01/13/2023   Thoracic aortic aneurysm without rupture (HCC) 11/09/2022   Hematuria 11/05/2022   Polyp of esophagus 11/02/2022   Iron deficiency anemia 07/28/2022   Positive colorectal cancer screening using Cologuard test    Abnormal ECG 03/31/2021   Acute non-recurrent sinusitis 11/30/2020   Acute otitis media 11/30/2020   Benign essential HTN 10/31/2018   Frequent PVCs 09/08/2017   A-fib (HCC) 08/27/2015   Clinical depression 08/27/2015   Blood pressure elevated 08/27/2015   Polypharmacy 08/27/2015   Esophagitis, reflux 08/27/2015   Fatigue 08/27/2015   Acid reflux 08/27/2015   HLD (hyperlipidemia) 08/27/2015   Adult hypothyroidism 08/27/2015   Adaptive colitis 08/27/2015   Malaise and fatigue 08/27/2015   Mild major depression (HCC) 08/27/2015   Polymyalgia rheumatica (HCC) 08/27/2015   Cranial arteritis (HCC) 08/27/2015   Avitaminosis D 08/27/2015   Grier Rocher PT, DPT  Physical Therapist - [place]  [facility]  11:27 AM 09/14/23

## 2023-09-20 ENCOUNTER — Ambulatory Visit: Payer: Medicare Other

## 2023-09-20 ENCOUNTER — Encounter: Payer: Self-pay | Admitting: Urology

## 2023-09-20 DIAGNOSIS — R2681 Unsteadiness on feet: Secondary | ICD-10-CM | POA: Diagnosis not present

## 2023-09-20 DIAGNOSIS — R296 Repeated falls: Secondary | ICD-10-CM | POA: Diagnosis not present

## 2023-09-20 DIAGNOSIS — R262 Difficulty in walking, not elsewhere classified: Secondary | ICD-10-CM | POA: Diagnosis not present

## 2023-09-20 DIAGNOSIS — M6281 Muscle weakness (generalized): Secondary | ICD-10-CM | POA: Diagnosis not present

## 2023-09-20 NOTE — Therapy (Signed)
[facility] Outpatient Rehabilitation at [facility] [address] [place], [place], [address] Phone: [phone]   Fax:  [phone]  Outpatient Physical Therapy Treatment   Patient Details  Name: Tara Marquez MRN: [id number] Date of Birth: [birth year] Referring Provider (PT): Julieanne Manson MD (PCP)   Encounter Date: 09/20/2023   PT End of Session - 09/20/23 1100     Visit Number 15    Number of Visits 37    Date for PT Re-Evaluation 12/01/23    Authorization Type Medicare A&B primary with BCBS supplemental secondary    Progress Note Due on Visit 20    PT Start Time 1102    PT Stop Time 1145    PT Time Calculation (min) 43 min    Equipment Utilized During Treatment Gait belt    Activity Tolerance Patient tolerated treatment well    Behavior During Therapy WFL for tasks assessed/performed                  Past Medical History:  Diagnosis Date   Acute pyelonephritis    Adaptive colitis    Anemia    Aortic atherosclerosis (HCC)    Atrial fibrillation and flutter (HCC)    a.) CHA2DS2VASc = 5 (age x 2, sex, HTN, vascular disease history);  b.) rate/rhythm maintained on oral amiodarone + metoprolol succinate; chronically anticoagulated with dose reduced apixaban   CAD (coronary artery disease)    Depression    Descending thoracic aortic aneurysm (HCC)    a.) CT chest 09/29/2022: 4.9 cm; b.) CT abd 01/13/2023: 4.9 cm   Frequent PVCs    GERD (gastroesophageal reflux disease)    Hepatic steatosis    History of 2019 novel coronavirus disease (COVID-19) 12/04/2021   Hyperlipidemia    Hypertension    Hypokalemia    Hypothyroidism    Long term current use of amiodarone    Long term current use of anticoagulant    a.) dose reduced apixaban   Nephrolithiasis    Osteopenia    Polymyalgia rheumatica (HCC)    Skin cancer    Splenomegaly    Temporal arteritis (HCC)    Vitamin D deficiency     Past Surgical History:  Procedure Laterality  Date   ABDOMINAL HYSTERECTOMY  03/1971   Status Post Hysterectomy   CATARACT EXTRACTION Left    COLONOSCOPY     2010   COLONOSCOPY WITH PROPOFOL N/A 01/12/2022   Procedure: COLONOSCOPY WITH PROPOFOL;  Surgeon: Midge Minium, MD;  Location: [facility] ENDOSCOPY;  Service: Endoscopy;  Laterality: N/A;   CYSTOSCOPY WITH RETROGRADE PYELOGRAM, URETEROSCOPY AND STENT PLACEMENT Left 01/13/2023   Procedure: CYSTOSCOPY WITH RETROGRADE PYELOGRAM, URETEROSCOPY AND STENT PLACEMENT;  Surgeon: Riki Altes, MD;  Location: [facility] ORS;  Service: Urology;  Laterality: Left;   CYSTOSCOPY/URETEROSCOPY/HOLMIUM LASER/STENT PLACEMENT Left 02/08/2023   Procedure: CYSTOSCOPY/URETEROSCOPY/HOLMIUM LASER/STENT EXCHANGE;  Surgeon: Riki Altes, MD;  Location: [facility] ORS;  Service: Urology;  Laterality: Left;   ESOPHAGOGASTRODUODENOSCOPY N/A 11/02/2022   Procedure: ESOPHAGOGASTRODUODENOSCOPY (EGD);  Surgeon: Midge Minium, MD;  Location: [facility] ENDOSCOPY;  Service: Endoscopy;  Laterality: N/A;   EYE SURGERY     GIVENS CAPSULE STUDY N/A 12/22/2022   Procedure: GIVENS CAPSULE STUDY;  Surgeon: Midge Minium, MD;  Location: [facility] ENDOSCOPY;  Service: Endoscopy;  Laterality: N/A;   TONSILLECTOMY     TONSILLECTOMY AND ADENOIDECTOMY  1951   WISDOM TOOTH EXTRACTION  1970       Subjective Assessment     Subjective Patient reports doing  well with no new issues today. Denies any pain or falls.     Pertinent History Tara Marquez is an 82yoF who is referred by PCP due to gradual progressive weakness since hospital admission in February 2024. Pt presents to PT eval with husband. Pt reports being in hospital for ~ a week in Feb 2024 with urinary obstruction, AKI, felt week initially but declined HHPT; pt/husband say progressively more weak over subsequent months, more difficulty with walking, husband has now asked her to use a cane. Pt reports 5 falls, 4 of which were outside on natural surface 5th trying to clear a threshold (posterior fall  buttock, back, hit head). Does get dizzy, but not frequently, not related to falls often.    How long can you sit comfortably? Not a limitation    How long can you stand comfortably? Not a limitation    How long can you walk comfortably? Mostly walks household distances, but can tolerate occasional store trips with buggy support.    Currently in Pain? No/denies               Objective measurements completed on examination: See above findings.    TODAY'S TREATMENT: 09/20/23   THEREX:   Superset #`1`- 2 sets of the following Ex 1-Seated hip flex with 2.5 # AW alt LE Ex 2-Seated knee ext with 2.5 # AW alt LE  Ambulation with 2.5# AW and SPC approx 175 feet (3-4 instances of left foot drag- requiring VC to pick up left foot)   Superset #2 - 2 sets of the following Ex. 1- Standing side step up/over 1/2 foam roll - 2.5# x 10  Ex 2- Standing calf raises on 1/2 foam roll-2.5# x 10 reps Ambulation with 2.5 # AW and SPC- approx 125 feet -Less episodes of left foot drag  Superset #3- 2 sets of the following  1) Sit to stand without UE support x 10 reps 2) seated toe raises x15 reps Ambulation with 2.5# AW and SPC - approx 125 feet        PT Education -     Education Details Exercise technique- VC for safety                         PT Short Term Goals -        PT SHORT TERM GOAL #1   Title compliant with balance HEP    Time 4    Period Weeks    Status Achieved    Target Date 08/12/23      PT SHORT TERM GOAL #2   Title Improved overground gait speed over 510ft to improve ability to access limited community distances safely.    Baseline eval: 354ft at 0.4m/s; 633ft s device 0.75m/s.   Time 4    Period Weeks    Status Achieved     Target Date 08/12/23               PT Long Term Goals -       PT LONG TERM GOAL #1   Title FOTO score improvement >12 points to indicate improve confidence in balance.    Baseline 07/19/2023=43; 08/22/23: 51; 09/08/2023  =will assess next visit   Time 12   Period Weeks    Status On going     Target Date 12/01/2023     PT LONG TERM GOAL #2   Title Improvement in standardized balance assessment by MCID or greater.  Baseline 07/19/2023= BERG- 35/56; 08/22/23: 44   Time 8    Period Weeks    Status Achieved     Target Date 09/09/23      PT LONG TERM GOAL #3   Title Pt to demonstrate 5xSTS hands free from standard height surface to indicated improved BLE power.    Baseline 07/19/2023= 31.07 sec without UE Support; 08/22/23: 19.15sec (hands on thighs); 09/08/2023= 16.42 sec    Time 8    Period Weeks    Status MET   Target Date 09/09/23      PT LONG TERM GOAL #4   Title Pt to demonstrate performance of 2025ft AMB >0.21m/s without increased pain and without LOB at modI level or less to restore ability to access comunity distances for IADL.    Baseline 08/22/23: 672ft in 4:02 (0.57m/s); 08/25/2023= 1200 in . 9/12= 0.62 m/s   Time 8    Period Weeks    Status Ongoing    Target Date 12/01/23         PT LONG TERM GOAL #5  Title Pt will improve BERG by at least 3 points in order to demonstrate clinically significant improvement in balance.   Baseline 09/08/2023= 46/56  Time 12  Period Weeks   Status NEW  Target Date 12/01/23       PT LONG TERM GOAL #6  Title  Pt will decrease 5TSTS by at least 3 seconds in order to demonstrate clinically significant improvement in LE strength.  Baseline 09/08/2023= 16.42 sec   Time 12  Period Weeks   Status  NEW  Target Date 12/01/23         Plan -     Clinical Impression Statement Patient presented with good motivation for today's session. She performed well overall- able to respond to verbal cues to pick up her left foot when walking. She was fatigued with circuit stye workout but did complete sets and reps as prescribed today. She will benefit from more endurance type functional activities in future visits. . Pt will benefit from skilled PT services to  address and improve her functional strength and mobility to assist in return to previous level of function.    Personal Factors and Comorbidities Age;Fitness;Behavior Pattern;Past/Current Experience    Examination-Activity Limitations Locomotion Level;Transfers;Lift;Stairs    Examination-Participation Restrictions Meal Prep;Cleaning;Community Activity;Driving;Interpersonal Relationship;Yard Work    Stability/Clinical Decision Making Stable/Uncomplicated    Clinical Decision Making Moderate    Rehab Potential Good    PT Frequency 1-2x / week    PT Duration 12 weeks    PT Treatment/Interventions ADLs/Self Care Home Management;Gait training;Stair training;Functional mobility training;Neuromuscular re-education;Balance training;Therapeutic exercise;Therapeutic activities;Passive range of motion;Manual techniques    PT Next Visit Plan Check vitals,  continue with balance and therex vor home exercises. Update FOTO Score       PT Home Exercise Plan asked at eval to check and log BP twice daily,   Access Code: [email or web address] URL: [email or web address] Date: 07/21/2023 Prepared by: Grier Rocher  Exercises - Sit to Stand with Armchair  - 1 x daily - 5 x weekly - 2 sets - 6 reps - Standing Hip Abduction with Counter Support  - 1 x daily - 7 x weekly - 3 sets - 10 reps - Seated Hip Abduction with Resistance  - 1 x daily - 7 x weekly - 3 sets - 10 reps - Seated March with Resistance  - 1 x daily - 7 x weekly - 3 sets - 10 reps   Consulted  and Agree with Plan of Care Patient;Family member/caregiver             Patient will benefit from skilled therapeutic intervention in order to improve the following deficits and impairments:   balance impairments, endurance deficits, reduced strength,   Visit Diagnosis: Muscle weakness (generalized)  Difficulty in walking, not elsewhere classified  Unsteadiness on feet     Problem List Patient Active Problem List   Diagnosis Date Noted    Obstructive nephropathy 01/13/2023   Acute pyelonephritis 01/13/2023   AKI (acute kidney injury) (HCC) 01/13/2023   Hypokalemia 01/13/2023   Diarrhea 01/13/2023   Thoracic aortic aneurysm without rupture (HCC) 11/09/2022   Hematuria 11/05/2022   Polyp of esophagus 11/02/2022   Iron deficiency anemia 07/28/2022   Positive colorectal cancer screening using Cologuard test    Abnormal ECG 03/31/2021   Acute non-recurrent sinusitis 11/30/2020   Acute otitis media 11/30/2020   Benign essential HTN 10/31/2018   Frequent PVCs 09/08/2017   A-fib (HCC) 08/27/2015   Clinical depression 08/27/2015   Blood pressure elevated 08/27/2015   Polypharmacy 08/27/2015   Esophagitis, reflux 08/27/2015   Fatigue 08/27/2015   Acid reflux 08/27/2015   HLD (hyperlipidemia) 08/27/2015   Adult hypothyroidism 08/27/2015   Adaptive colitis 08/27/2015   Malaise and fatigue 08/27/2015   Mild major depression (HCC) 08/27/2015   Polymyalgia rheumatica (HCC) 08/27/2015   Cranial arteritis (HCC) 08/27/2015   Avitaminosis D 08/27/2015   Louis Meckel, PT Physical Therapist - [place]  [facility]  11:54 AM 09/20/23

## 2023-09-22 ENCOUNTER — Ambulatory Visit: Payer: Medicare Other

## 2023-09-22 DIAGNOSIS — R262 Difficulty in walking, not elsewhere classified: Secondary | ICD-10-CM

## 2023-09-22 DIAGNOSIS — M6281 Muscle weakness (generalized): Secondary | ICD-10-CM

## 2023-09-22 DIAGNOSIS — R2681 Unsteadiness on feet: Secondary | ICD-10-CM | POA: Diagnosis not present

## 2023-09-22 DIAGNOSIS — R296 Repeated falls: Secondary | ICD-10-CM | POA: Diagnosis not present

## 2023-09-22 NOTE — Therapy (Signed)
[facility] Outpatient Rehabilitation at [facility] [address] [place], [place], [address] Phone: [phone]   Fax:  [phone]  Outpatient Physical Therapy Treatment   Patient Details  Name: Tara Marquez MRN: [id number] Date of Birth: [birth year] Referring Provider (PT): Julieanne Manson MD (PCP)   Encounter Date: 09/22/2023   PT End of Session - 09/22/23 1540     Visit Number 16    Number of Visits 37    Date for PT Re-Evaluation 12/01/23    Authorization Type Medicare A&B primary with BCBS supplemental secondary    Progress Note Due on Visit 20    PT Start Time 1532    PT Stop Time 1615    PT Time Calculation (min) 43 min    Equipment Utilized During Treatment Gait belt    Activity Tolerance Patient tolerated treatment well    Behavior During Therapy WFL for tasks assessed/performed                  Past Medical History:  Diagnosis Date   Acute pyelonephritis    Adaptive colitis    Anemia    Aortic atherosclerosis (HCC)    Atrial fibrillation and flutter (HCC)    a.) CHA2DS2VASc = 5 (age x 2, sex, HTN, vascular disease history);  b.) rate/rhythm maintained on oral amiodarone + metoprolol succinate; chronically anticoagulated with dose reduced apixaban   CAD (coronary artery disease)    Depression    Descending thoracic aortic aneurysm (HCC)    a.) CT chest 09/29/2022: 4.9 cm; b.) CT abd 01/13/2023: 4.9 cm   Frequent PVCs    GERD (gastroesophageal reflux disease)    Hepatic steatosis    History of 2019 novel coronavirus disease (COVID-19) 12/04/2021   Hyperlipidemia    Hypertension    Hypokalemia    Hypothyroidism    Long term current use of amiodarone    Long term current use of anticoagulant    a.) dose reduced apixaban   Nephrolithiasis    Osteopenia    Polymyalgia rheumatica (HCC)    Skin cancer    Splenomegaly    Temporal arteritis (HCC)    Vitamin D deficiency     Past Surgical History:  Procedure Laterality  Date   ABDOMINAL HYSTERECTOMY  03/1971   Status Post Hysterectomy   CATARACT EXTRACTION Left    COLONOSCOPY     2010   COLONOSCOPY WITH PROPOFOL N/A 01/12/2022   Procedure: COLONOSCOPY WITH PROPOFOL;  Surgeon: Midge Minium, MD;  Location: [facility] ENDOSCOPY;  Service: Endoscopy;  Laterality: N/A;   CYSTOSCOPY WITH RETROGRADE PYELOGRAM, URETEROSCOPY AND STENT PLACEMENT Left 01/13/2023   Procedure: CYSTOSCOPY WITH RETROGRADE PYELOGRAM, URETEROSCOPY AND STENT PLACEMENT;  Surgeon: Riki Altes, MD;  Location: [facility] ORS;  Service: Urology;  Laterality: Left;   CYSTOSCOPY/URETEROSCOPY/HOLMIUM LASER/STENT PLACEMENT Left 02/08/2023   Procedure: CYSTOSCOPY/URETEROSCOPY/HOLMIUM LASER/STENT EXCHANGE;  Surgeon: Riki Altes, MD;  Location: [facility] ORS;  Service: Urology;  Laterality: Left;   ESOPHAGOGASTRODUODENOSCOPY N/A 11/02/2022   Procedure: ESOPHAGOGASTRODUODENOSCOPY (EGD);  Surgeon: Midge Minium, MD;  Location: [facility] ENDOSCOPY;  Service: Endoscopy;  Laterality: N/A;   EYE SURGERY     GIVENS CAPSULE STUDY N/A 12/22/2022   Procedure: GIVENS CAPSULE STUDY;  Surgeon: Midge Minium, MD;  Location: [facility] ENDOSCOPY;  Service: Endoscopy;  Laterality: N/A;   TONSILLECTOMY     TONSILLECTOMY AND ADENOIDECTOMY  1951   WISDOM TOOTH EXTRACTION  1970       Subjective Assessment     Subjective Patient reports feeling "  blah today"      Pertinent History Tara Marquez is an 82yoF who is referred by PCP due to gradual progressive weakness since hospital admission in February 2024. Pt presents to PT eval with husband. Pt reports being in hospital for ~ a week in Feb 2024 with urinary obstruction, AKI, felt week initially but declined HHPT; pt/husband say progressively more weak over subsequent months, more difficulty with walking, husband has now asked her to use a cane. Pt reports 5 falls, 4 of which were outside on natural surface 5th trying to clear a threshold (posterior fall buttock, back, hit head). Does get dizzy,  but not frequently, not related to falls often.    How long can you sit comfortably? Not a limitation    How long can you stand comfortably? Not a limitation    How long can you walk comfortably? Mostly walks household distances, but can tolerate occasional store trips with buggy support.    Currently in Pain? No/denies               Objective measurements completed on examination: See above findings.    TODAY'S TREATMENT: 09/23/23   BP= 132/75 mmHg (sitting)  BP= 140/67 mmHg (Standing)   THEREX:   Superset #`1`- 1 set of the following Ex 1-Standing step tap onto 2nd step with 2.0 # AW alt LE x 15 reps Ex 2-Step up onto 1st step  with 2.0 # AW alt LE  x 15 reps Ambulation with 2.0# AW without AD approx 175 feet (2-3 instances of left foot drag- requiring VC to pick up left foot)   Superset #2 -  2 sets of the following Ex. 1- Sitting  side step up/over [facility] - 2.0# x 10  Ex 2- Standing calf raises on 1/2 foam roll-2.5# x 10 reps Ambulation with 2.0 # AW and SPC- approx 175 feet -   Superset #3- 2 sets of the following  1) Sit to stand without UE support x 10 reps 2) Ambulation with 2.0# AW and SPC - approx 175 feet        PT Education -     Education Details Exercise technique- VC for safety                         PT Short Term Goals -        PT SHORT TERM GOAL #1   Title compliant with balance HEP    Time 4    Period Weeks    Status Achieved    Target Date 08/12/23      PT SHORT TERM GOAL #2   Title Improved overground gait speed over 565ft to improve ability to access limited community distances safely.    Baseline eval: 357ft at 0.78m/s; 649ft s device 0.79m/s.   Time 4    Period Weeks    Status Achieved     Target Date 08/12/23               PT Long Term Goals -       PT LONG TERM GOAL #1   Title FOTO score improvement >12 points to indicate improve confidence in balance.    Baseline 07/19/2023=43; 08/22/23: 51; 09/08/2023 =will  assess next visit   Time 12   Period Weeks    Status On going     Target Date 12/01/2023     PT LONG TERM GOAL #2   Title Improvement in standardized balance assessment by  MCID or greater.    Baseline 07/19/2023= BERG- 35/56; 08/22/23: 44   Time 8    Period Weeks    Status Achieved     Target Date 09/09/23      PT LONG TERM GOAL #3   Title Pt to demonstrate 5xSTS hands free from standard height surface to indicated improved BLE power.    Baseline 07/19/2023= 31.07 sec without UE Support; 08/22/23: 19.15sec (hands on thighs); 09/08/2023= 16.42 sec    Time 8    Period Weeks    Status MET   Target Date 09/09/23      PT LONG TERM GOAL #4   Title Pt to demonstrate performance of 2022ft AMB >0.69m/s without increased pain and without LOB at modI level or less to restore ability to access comunity distances for IADL.    Baseline 08/22/23: 682ft in 4:02 (0.65m/s); 08/25/2023= 1200 in . 9/12= 0.62 m/s   Time 8    Period Weeks    Status Ongoing    Target Date 12/01/23         PT LONG TERM GOAL #5  Title Pt will improve BERG by at least 3 points in order to demonstrate clinically significant improvement in balance.   Baseline 09/08/2023= 46/56  Time 12  Period Weeks   Status NEW  Target Date 12/01/23       PT LONG TERM GOAL #6  Title  Pt will decrease 5TSTS by at least 3 seconds in order to demonstrate clinically significant improvement in LE strength.  Baseline 09/08/2023= 16.42 sec   Time 12  Period Weeks   Status  NEW  Target Date 12/01/23         Plan -     Clinical Impression Statement Patient presented with good motivation. She performed well with circuit style workout again today. She was able to perform both sitting and standing exercises with some resistance and less foot drag today.  Pt will benefit from skilled PT services to address and improve her functional strength and mobility to assist in return to previous level of function.    Personal Factors and  Comorbidities Age;Fitness;Behavior Pattern;Past/Current Experience    Examination-Activity Limitations Locomotion Level;Transfers;Lift;Stairs    Examination-Participation Restrictions Meal Prep;Cleaning;Community Activity;Driving;Interpersonal Relationship;Yard Work    Stability/Clinical Decision Making Stable/Uncomplicated    Clinical Decision Making Moderate    Rehab Potential Good    PT Frequency 1-2x / week    PT Duration 12 weeks    PT Treatment/Interventions ADLs/Self Care Home Management;Gait training;Stair training;Functional mobility training;Neuromuscular re-education;Balance training;Therapeutic exercise;Therapeutic activities;Passive range of motion;Manual techniques    PT Next Visit Plan Check vitals,  continue with balance and therex vor home exercises. Update FOTO Score       PT Home Exercise Plan asked at eval to check and log BP twice daily,   Access Code: [email or web address] URL: [email or web address] Date: 07/21/2023 Prepared by: Grier Rocher  Exercises - Sit to Stand with Armchair  - 1 x daily - 5 x weekly - 2 sets - 6 reps - Standing Hip Abduction with Counter Support  - 1 x daily - 7 x weekly - 3 sets - 10 reps - Seated Hip Abduction with Resistance  - 1 x daily - 7 x weekly - 3 sets - 10 reps - Seated March with Resistance  - 1 x daily - 7 x weekly - 3 sets - 10 reps   Consulted and Agree with Plan of Care Patient;Family member/caregiver  Patient will benefit from skilled therapeutic intervention in order to improve the following deficits and impairments:   balance impairments, endurance deficits, reduced strength,   Visit Diagnosis: Muscle weakness (generalized)  Difficulty in walking, not elsewhere classified  Unsteadiness on feet  Repeated falls     Problem List Patient Active Problem List   Diagnosis Date Noted   Obstructive nephropathy 01/13/2023   Acute pyelonephritis 01/13/2023   AKI (acute kidney injury) (HCC)  01/13/2023   Hypokalemia 01/13/2023   Diarrhea 01/13/2023   Thoracic aortic aneurysm without rupture (HCC) 11/09/2022   Hematuria 11/05/2022   Polyp of esophagus 11/02/2022   Iron deficiency anemia 07/28/2022   Positive colorectal cancer screening using Cologuard test    Abnormal ECG 03/31/2021   Acute non-recurrent sinusitis 11/30/2020   Acute otitis media 11/30/2020   Benign essential HTN 10/31/2018   Frequent PVCs 09/08/2017   A-fib (HCC) 08/27/2015   Clinical depression 08/27/2015   Blood pressure elevated 08/27/2015   Polypharmacy 08/27/2015   Esophagitis, reflux 08/27/2015   Fatigue 08/27/2015   Acid reflux 08/27/2015   HLD (hyperlipidemia) 08/27/2015   Adult hypothyroidism 08/27/2015   Adaptive colitis 08/27/2015   Malaise and fatigue 08/27/2015   Mild major depression (HCC) 08/27/2015   Polymyalgia rheumatica (HCC) 08/27/2015   Cranial arteritis (HCC) 08/27/2015   Avitaminosis D 08/27/2015   Louis Meckel, PT Physical Therapist - [place]  [facility]  8:22 AM 09/23/23

## 2023-09-27 ENCOUNTER — Ambulatory Visit: Payer: Medicare Other | Attending: Family Medicine

## 2023-09-27 DIAGNOSIS — R296 Repeated falls: Secondary | ICD-10-CM | POA: Diagnosis not present

## 2023-09-27 DIAGNOSIS — R2681 Unsteadiness on feet: Secondary | ICD-10-CM

## 2023-09-27 DIAGNOSIS — M6281 Muscle weakness (generalized): Secondary | ICD-10-CM | POA: Diagnosis not present

## 2023-09-27 DIAGNOSIS — R262 Difficulty in walking, not elsewhere classified: Secondary | ICD-10-CM | POA: Diagnosis not present

## 2023-09-27 NOTE — Therapy (Signed)
[facility] Outpatient Rehabilitation at [facility] [address] [place], [place], [address] Phone: [phone]   Fax:  [phone]  Outpatient Physical Therapy Treatment   Patient Details  Name: Tara Marquez MRN: [id number] Date of Birth: [birth year] Referring Provider (PT): Julieanne Manson MD (PCP)   Encounter Date: 09/27/2023   PT End of Session - 09/27/23 1107     Visit Number 17    Number of Visits 37    Date for PT Re-Evaluation 12/01/23    Authorization Type Medicare A&B primary with BCBS supplemental secondary    Progress Note Due on Visit 20    PT Start Time 1102    Equipment Utilized During Treatment Gait belt    Activity Tolerance Patient tolerated treatment well    Behavior During Therapy WFL for tasks assessed/performed                   Past Medical History:  Diagnosis Date   Acute pyelonephritis    Adaptive colitis    Anemia    Aortic atherosclerosis (HCC)    Atrial fibrillation and flutter (HCC)    a.) CHA2DS2VASc = 5 (age x 2, sex, HTN, vascular disease history);  b.) rate/rhythm maintained on oral amiodarone + metoprolol succinate; chronically anticoagulated with dose reduced apixaban   CAD (coronary artery disease)    Depression    Descending thoracic aortic aneurysm (HCC)    a.) CT chest 09/29/2022: 4.9 cm; b.) CT abd 01/13/2023: 4.9 cm   Frequent PVCs    GERD (gastroesophageal reflux disease)    Hepatic steatosis    History of 2019 novel coronavirus disease (COVID-19) 12/04/2021   Hyperlipidemia    Hypertension    Hypokalemia    Hypothyroidism    Long term current use of amiodarone    Long term current use of anticoagulant    a.) dose reduced apixaban   Nephrolithiasis    Osteopenia    Polymyalgia rheumatica (HCC)    Skin cancer    Splenomegaly    Temporal arteritis (HCC)    Vitamin D deficiency     Past Surgical History:  Procedure Laterality Date   ABDOMINAL HYSTERECTOMY  03/1971   Status Post  Hysterectomy   CATARACT EXTRACTION Left    COLONOSCOPY     2010   COLONOSCOPY WITH PROPOFOL N/A 01/12/2022   Procedure: COLONOSCOPY WITH PROPOFOL;  Surgeon: Midge Minium, MD;  Location: [facility] ENDOSCOPY;  Service: Endoscopy;  Laterality: N/A;   CYSTOSCOPY WITH RETROGRADE PYELOGRAM, URETEROSCOPY AND STENT PLACEMENT Left 01/13/2023   Procedure: CYSTOSCOPY WITH RETROGRADE PYELOGRAM, URETEROSCOPY AND STENT PLACEMENT;  Surgeon: Riki Altes, MD;  Location: [facility] ORS;  Service: Urology;  Laterality: Left;   CYSTOSCOPY/URETEROSCOPY/HOLMIUM LASER/STENT PLACEMENT Left 02/08/2023   Procedure: CYSTOSCOPY/URETEROSCOPY/HOLMIUM LASER/STENT EXCHANGE;  Surgeon: Riki Altes, MD;  Location: [facility] ORS;  Service: Urology;  Laterality: Left;   ESOPHAGOGASTRODUODENOSCOPY N/A 11/02/2022   Procedure: ESOPHAGOGASTRODUODENOSCOPY (EGD);  Surgeon: Midge Minium, MD;  Location: [facility] ENDOSCOPY;  Service: Endoscopy;  Laterality: N/A;   EYE SURGERY     GIVENS CAPSULE STUDY N/A 12/22/2022   Procedure: GIVENS CAPSULE STUDY;  Surgeon: Midge Minium, MD;  Location: [facility] ENDOSCOPY;  Service: Endoscopy;  Laterality: N/A;   TONSILLECTOMY     TONSILLECTOMY AND ADENOIDECTOMY  1951   WISDOM TOOTH EXTRACTION  1970       Subjective Assessment     Subjective Patient reports feeling okay and no complaints today.      Pertinent History Tara Marquez is  an 82yoF who is referred by PCP due to gradual progressive weakness since hospital admission in February 2024. Pt presents to PT eval with husband. Pt reports being in hospital for ~ a week in Feb 2024 with urinary obstruction, AKI, felt week initially but declined HHPT; pt/husband say progressively more weak over subsequent months, more difficulty with walking, husband has now asked her to use a cane. Pt reports 5 falls, 4 of which were outside on natural surface 5th trying to clear a threshold (posterior fall buttock, back, hit head). Does get dizzy, but not frequently, not related to falls  often.    How long can you sit comfortably? Not a limitation    How long can you stand comfortably? Not a limitation    How long can you walk comfortably? Mostly walks household distances, but can tolerate occasional store trips with buggy support.    Currently in Pain? No/denies               Objective measurements completed on examination: See above findings.    TODAY'S TREATMENT: 09/27/23     THEREX:   Step up onto 6in block x 20 reps each LE Sit to stand x 10 without UE support  Side step up onto 6" block then off on opp side- back up and over- x 12 reps each way Sit to stand x 10 reps without UE support  (slight more fatigue that previous set)  Standing calf raises using small incline at // bars with BUE Support - 2 sets of 10 reps Ambulation- 500 feet without an AD in hospital including some horizontal visual scanning in hallway- 2-3 episodes of unsteadiness yet no LOB Standing hip ext alt LE - 2 sets of 10 reps.       PT Education -     Education Details Exercise technique- VC for safety                         PT Short Term Goals -        PT SHORT TERM GOAL #1   Title compliant with balance HEP    Time 4    Period Weeks    Status Achieved    Target Date 08/12/23      PT SHORT TERM GOAL #2   Title Improved overground gait speed over 577ft to improve ability to access limited community distances safely.    Baseline eval: 327ft at 0.25m/s; 654ft s device 0.27m/s.   Time 4    Period Weeks    Status Achieved     Target Date 08/12/23               PT Long Term Goals -       PT LONG TERM GOAL #1   Title FOTO score improvement >12 points to indicate improve confidence in balance.    Baseline 07/19/2023=43; 08/22/23: 51; 09/08/2023 =will assess next visit   Time 12   Period Weeks    Status On going     Target Date 12/01/2023     PT LONG TERM GOAL #2   Title Improvement in standardized balance assessment by MCID or greater.    Baseline  07/19/2023= BERG- 35/56; 08/22/23: 44   Time 8    Period Weeks    Status Achieved     Target Date 09/09/23      PT LONG TERM GOAL #3   Title Pt to demonstrate 5xSTS hands free from standard height  surface to indicated improved BLE power.    Baseline 07/19/2023= 31.07 sec without UE Support; 08/22/23: 19.15sec (hands on thighs); 09/08/2023= 16.42 sec    Time 8    Period Weeks    Status MET   Target Date 09/09/23      PT LONG TERM GOAL #4   Title Pt to demonstrate performance of 2099ft AMB >0.27m/s without increased pain and without LOB at modI level or less to restore ability to access comunity distances for IADL.    Baseline 08/22/23: 639ft in 4:02 (0.72m/s); 08/25/2023= 1200 in . 9/12= 0.62 m/s   Time 8    Period Weeks    Status Ongoing    Target Date 12/01/23         PT LONG TERM GOAL #5  Title Pt will improve BERG by at least 3 points in order to demonstrate clinically significant improvement in balance.   Baseline 09/08/2023= 46/56  Time 12  Period Weeks   Status NEW  Target Date 12/01/23       PT LONG TERM GOAL #6  Title  Pt will decrease 5TSTS by at least 3 seconds in order to demonstrate clinically significant improvement in LE strength.  Baseline 09/08/2023= 16.42 sec   Time 12  Period Weeks   Status  NEW  Target Date 12/01/23         Plan -     Clinical Impression Statement Treatment continued with progressive LE strengthening and functional endurance training. She performed well with less overall rest breaks and good response to VC for correct technique. Only a few episodes of unsteadiness yet much improved gait distance covered today.  Pt will benefit from skilled PT services to address and improve her functional strength and mobility to assist in return to previous level of function.    Personal Factors and Comorbidities Age;Fitness;Behavior Pattern;Past/Current Experience    Examination-Activity Limitations Locomotion Level;Transfers;Lift;Stairs     Examination-Participation Restrictions Meal Prep;Cleaning;Community Activity;Driving;Interpersonal Relationship;Yard Work    Stability/Clinical Decision Making Stable/Uncomplicated    Clinical Decision Making Moderate    Rehab Potential Good    PT Frequency 1-2x / week    PT Duration 12 weeks    PT Treatment/Interventions ADLs/Self Care Home Management;Gait training;Stair training;Functional mobility training;Neuromuscular re-education;Balance training;Therapeutic exercise;Therapeutic activities;Passive range of motion;Manual techniques    PT Next Visit Plan Check vitals,  continue with balance and therex vor home exercises. Update FOTO Score       PT Home Exercise Plan asked at eval to check and log BP twice daily,   Access Code: [email or web address] URL: [email or web address] Date: 07/21/2023 Prepared by: Grier Rocher  Exercises - Sit to Stand with Armchair  - 1 x daily - 5 x weekly - 2 sets - 6 reps - Standing Hip Abduction with Counter Support  - 1 x daily - 7 x weekly - 3 sets - 10 reps - Seated Hip Abduction with Resistance  - 1 x daily - 7 x weekly - 3 sets - 10 reps - Seated March with Resistance  - 1 x daily - 7 x weekly - 3 sets - 10 reps   Consulted and Agree with Plan of Care Patient;Family member/caregiver             Patient will benefit from skilled therapeutic intervention in order to improve the following deficits and impairments:   balance impairments, endurance deficits, reduced strength,   Visit Diagnosis: Muscle weakness (generalized)  Difficulty in walking, not elsewhere classified  Unsteadiness on feet  Repeated  falls     Problem List Patient Active Problem List   Diagnosis Date Noted   Obstructive nephropathy 01/13/2023   Acute pyelonephritis 01/13/2023   AKI (acute kidney injury) (HCC) 01/13/2023   Hypokalemia 01/13/2023   Diarrhea 01/13/2023   Thoracic aortic aneurysm without rupture (HCC) 11/09/2022   Hematuria 11/05/2022   Polyp  of esophagus 11/02/2022   Iron deficiency anemia 07/28/2022   Positive colorectal cancer screening using Cologuard test    Abnormal ECG 03/31/2021   Acute non-recurrent sinusitis 11/30/2020   Acute otitis media 11/30/2020   Benign essential HTN 10/31/2018   Frequent PVCs 09/08/2017   A-fib (HCC) 08/27/2015   Clinical depression 08/27/2015   Blood pressure elevated 08/27/2015   Polypharmacy 08/27/2015   Esophagitis, reflux 08/27/2015   Fatigue 08/27/2015   Acid reflux 08/27/2015   HLD (hyperlipidemia) 08/27/2015   Adult hypothyroidism 08/27/2015   Adaptive colitis 08/27/2015   Malaise and fatigue 08/27/2015   Mild major depression (HCC) 08/27/2015   Polymyalgia rheumatica (HCC) 08/27/2015   Cranial arteritis (HCC) 08/27/2015   Avitaminosis D 08/27/2015   Louis Meckel, PT Physical Therapist - [place]  [facility]  12:26 PM 09/27/23

## 2023-09-29 ENCOUNTER — Ambulatory Visit: Payer: Medicare Other

## 2023-09-29 DIAGNOSIS — M6281 Muscle weakness (generalized): Secondary | ICD-10-CM | POA: Diagnosis not present

## 2023-09-29 DIAGNOSIS — R296 Repeated falls: Secondary | ICD-10-CM

## 2023-09-29 DIAGNOSIS — R2681 Unsteadiness on feet: Secondary | ICD-10-CM

## 2023-09-29 DIAGNOSIS — R262 Difficulty in walking, not elsewhere classified: Secondary | ICD-10-CM

## 2023-09-29 NOTE — Therapy (Signed)
[facility] Outpatient Rehabilitation at [facility] [address] [place], [place], [address] Phone: [phone]   Fax:  [phone]  Outpatient Physical Therapy Treatment   Patient Details  Name: Tara Marquez MRN: [id number] Date of Birth: [birth year] Referring Provider (PT): Julieanne Manson MD (PCP)   Encounter Date: 09/29/2023   PT End of Session - 09/29/23 1542     Visit Number 18    Number of Visits 37    Date for PT Re-Evaluation 12/01/23    Authorization Type Medicare A&B primary with BCBS supplemental secondary    Progress Note Due on Visit 20    PT Start Time 1533    PT Stop Time 1613    PT Time Calculation (min) 40 min    Equipment Utilized During Treatment Gait belt    Activity Tolerance Patient tolerated treatment well    Behavior During Therapy WFL for tasks assessed/performed                   Past Medical History:  Diagnosis Date   Acute pyelonephritis    Adaptive colitis    Anemia    Aortic atherosclerosis (HCC)    Atrial fibrillation and flutter (HCC)    a.) CHA2DS2VASc = 5 (age x 2, sex, HTN, vascular disease history);  b.) rate/rhythm maintained on oral amiodarone + metoprolol succinate; chronically anticoagulated with dose reduced apixaban   CAD (coronary artery disease)    Depression    Descending thoracic aortic aneurysm (HCC)    a.) CT chest 09/29/2022: 4.9 cm; b.) CT abd 01/13/2023: 4.9 cm   Frequent PVCs    GERD (gastroesophageal reflux disease)    Hepatic steatosis    History of 2019 novel coronavirus disease (COVID-19) 12/04/2021   Hyperlipidemia    Hypertension    Hypokalemia    Hypothyroidism    Long term current use of amiodarone    Long term current use of anticoagulant    a.) dose reduced apixaban   Nephrolithiasis    Osteopenia    Polymyalgia rheumatica (HCC)    Skin cancer    Splenomegaly    Temporal arteritis (HCC)    Vitamin D deficiency     Past Surgical History:  Procedure Laterality  Date   ABDOMINAL HYSTERECTOMY  03/1971   Status Post Hysterectomy   CATARACT EXTRACTION Left    COLONOSCOPY     2010   COLONOSCOPY WITH PROPOFOL N/A 01/12/2022   Procedure: COLONOSCOPY WITH PROPOFOL;  Surgeon: Midge Minium, MD;  Location: [facility] ENDOSCOPY;  Service: Endoscopy;  Laterality: N/A;   CYSTOSCOPY WITH RETROGRADE PYELOGRAM, URETEROSCOPY AND STENT PLACEMENT Left 01/13/2023   Procedure: CYSTOSCOPY WITH RETROGRADE PYELOGRAM, URETEROSCOPY AND STENT PLACEMENT;  Surgeon: Riki Altes, MD;  Location: [facility] ORS;  Service: Urology;  Laterality: Left;   CYSTOSCOPY/URETEROSCOPY/HOLMIUM LASER/STENT PLACEMENT Left 02/08/2023   Procedure: CYSTOSCOPY/URETEROSCOPY/HOLMIUM LASER/STENT EXCHANGE;  Surgeon: Riki Altes, MD;  Location: [facility] ORS;  Service: Urology;  Laterality: Left;   ESOPHAGOGASTRODUODENOSCOPY N/A 11/02/2022   Procedure: ESOPHAGOGASTRODUODENOSCOPY (EGD);  Surgeon: Midge Minium, MD;  Location: [facility] ENDOSCOPY;  Service: Endoscopy;  Laterality: N/A;   EYE SURGERY     GIVENS CAPSULE STUDY N/A 12/22/2022   Procedure: GIVENS CAPSULE STUDY;  Surgeon: Midge Minium, MD;  Location: [facility] ENDOSCOPY;  Service: Endoscopy;  Laterality: N/A;   TONSILLECTOMY     TONSILLECTOMY AND ADENOIDECTOMY  1951   WISDOM TOOTH EXTRACTION  1970       Subjective Assessment     Subjective Patient states  no complaints and reports walking at home without her cane.      Pertinent History Tara Marquez is an 82yoF who is referred by PCP due to gradual progressive weakness since hospital admission in February 2024. Pt presents to PT eval with husband. Pt reports being in hospital for ~ a week in Feb 2024 with urinary obstruction, AKI, felt week initially but declined HHPT; pt/husband say progressively more weak over subsequent months, more difficulty with walking, husband has now asked her to use a cane. Pt reports 5 falls, 4 of which were outside on natural surface 5th trying to clear a threshold (posterior fall  buttock, back, hit head). Does get dizzy, but not frequently, not related to falls often.    How long can you sit comfortably? Not a limitation    How long can you stand comfortably? Not a limitation    How long can you walk comfortably? Mostly walks household distances, but can tolerate occasional store trips with buggy support.    Currently in Pain? No/denies               Objective measurements completed on examination: See above findings.    TODAY'S TREATMENT: 09/29/23     THEREX:   Step up onto 6in block x 12 reps 3# AW each LE Step up/over 1/2 foam roll  then back- 3# AW x 12 Sit to stand x 10 without UE support  Standing calf raises using  1/2 foam roll at // bars with BUE Support - 1 set of 10 reps with 3 sec hold Side step up/over 1/2 foam 3# AW x 12 reps with 1 UE support to no UE support.  Ambulation- 150 feet without AD  in Clinic with 3# AW-1:28 Ambulation - 150 feet without AD in clinic without AW- 1:08 Sit to stand x 10 reps without UE support     PT Education -     Education Details Exercise technique- VC for safety                         PT Short Term Goals -        PT SHORT TERM GOAL #1   Title compliant with balance HEP    Time 4    Period Weeks    Status Achieved    Target Date 08/12/23      PT SHORT TERM GOAL #2   Title Improved overground gait speed over 543ft to improve ability to access limited community distances safely.    Baseline eval: 381ft at 0.49m/s; 67ft s device 0.9m/s.   Time 4    Period Weeks    Status Achieved     Target Date 08/12/23               PT Long Term Goals -       PT LONG TERM GOAL #1   Title FOTO score improvement >12 points to indicate improve confidence in balance.    Baseline 07/19/2023=43; 08/22/23: 51; 09/08/2023 =will assess next visit   Time 12   Period Weeks    Status On going     Target Date 12/01/2023     PT LONG TERM GOAL #2   Title Improvement in standardized balance  assessment by MCID or greater.    Baseline 07/19/2023= BERG- 35/56; 08/22/23: 44   Time 8    Period Weeks    Status Achieved     Target Date 09/09/23  PT LONG TERM GOAL #3   Title Pt to demonstrate 5xSTS hands free from standard height surface to indicated improved BLE power.    Baseline 07/19/2023= 31.07 sec without UE Support; 08/22/23: 19.15sec (hands on thighs); 09/08/2023= 16.42 sec    Time 8    Period Weeks    Status MET   Target Date 09/09/23      PT LONG TERM GOAL #4   Title Pt to demonstrate performance of 2043ft AMB >0.25m/s without increased pain and without LOB at modI level or less to restore ability to access comunity distances for IADL.    Baseline 08/22/23: 647ft in 4:02 (0.11m/s); 08/25/2023= 1200 in . 9/12= 0.62 m/s   Time 8    Period Weeks    Status Ongoing    Target Date 12/01/23         PT LONG TERM GOAL #5  Title Pt will improve BERG by at least 3 points in order to demonstrate clinically significant improvement in balance.   Baseline 09/08/2023= 46/56  Time 12  Period Weeks   Status NEW  Target Date 12/01/23       PT LONG TERM GOAL #6  Title  Pt will decrease 5TSTS by at least 3 seconds in order to demonstrate clinically significant improvement in LE strength.  Baseline 09/08/2023= 16.42 sec   Time 12  Period Weeks   Status  NEW  Target Date 12/01/23         Plan -     Clinical Impression Statement Patient presents with good motivation for today's session. She performed well with increased resistance today and challenged to complete exercises/reps. No obvious LOB with gait without ankle weights or without and much improved sit to stand ability this week. Pt will benefit from skilled PT services to address and improve her functional strength and mobility to assist in return to previous level of function.    Personal Factors and Comorbidities Age;Fitness;Behavior Pattern;Past/Current Experience    Examination-Activity Limitations Locomotion  Level;Transfers;Lift;Stairs    Examination-Participation Restrictions Meal Prep;Cleaning;Community Activity;Driving;Interpersonal Relationship;Yard Work    Stability/Clinical Decision Making Stable/Uncomplicated    Clinical Decision Making Moderate    Rehab Potential Good    PT Frequency 1-2x / week    PT Duration 12 weeks    PT Treatment/Interventions ADLs/Self Care Home Management;Gait training;Stair training;Functional mobility training;Neuromuscular re-education;Balance training;Therapeutic exercise;Therapeutic activities;Passive range of motion;Manual techniques    PT Next Visit Plan Continue with balance and therex vor home exercises.        PT Home Exercise Plan asked at eval to check and log BP twice daily,   Access Code: [email or web address] URL: [email or web address] Date: 07/21/2023 Prepared by: Grier Rocher  Exercises - Sit to Stand with Armchair  - 1 x daily - 5 x weekly - 2 sets - 6 reps - Standing Hip Abduction with Counter Support  - 1 x daily - 7 x weekly - 3 sets - 10 reps - Seated Hip Abduction with Resistance  - 1 x daily - 7 x weekly - 3 sets - 10 reps - Seated March with Resistance  - 1 x daily - 7 x weekly - 3 sets - 10 reps   Consulted and Agree with Plan of Care Patient;Family member/caregiver             Patient will benefit from skilled therapeutic intervention in order to improve the following deficits and impairments:   balance impairments, endurance deficits, reduced strength,   Visit Diagnosis: Muscle weakness (generalized)  Difficulty  in walking, not elsewhere classified  Unsteadiness on feet  Repeated falls     Problem List Patient Active Problem List   Diagnosis Date Noted   Obstructive nephropathy 01/13/2023   Acute pyelonephritis 01/13/2023   AKI (acute kidney injury) (HCC) 01/13/2023   Hypokalemia 01/13/2023   Diarrhea 01/13/2023   Thoracic aortic aneurysm without rupture (HCC) 11/09/2022   Hematuria 11/05/2022   Polyp  of esophagus 11/02/2022   Iron deficiency anemia 07/28/2022   Positive colorectal cancer screening using Cologuard test    Abnormal ECG 03/31/2021   Acute non-recurrent sinusitis 11/30/2020   Acute otitis media 11/30/2020   Benign essential HTN 10/31/2018   Frequent PVCs 09/08/2017   A-fib (HCC) 08/27/2015   Clinical depression 08/27/2015   Blood pressure elevated 08/27/2015   Polypharmacy 08/27/2015   Esophagitis, reflux 08/27/2015   Fatigue 08/27/2015   Acid reflux 08/27/2015   HLD (hyperlipidemia) 08/27/2015   Adult hypothyroidism 08/27/2015   Adaptive colitis 08/27/2015   Malaise and fatigue 08/27/2015   Mild major depression (HCC) 08/27/2015   Polymyalgia rheumatica (HCC) 08/27/2015   Cranial arteritis (HCC) 08/27/2015   Avitaminosis D 08/27/2015   Louis Meckel, PT Physical Therapist - [place]  [facility]  5:32 PM 09/29/23

## 2023-10-04 ENCOUNTER — Ambulatory Visit: Payer: Medicare Other | Admitting: Physical Therapy

## 2023-10-04 DIAGNOSIS — R262 Difficulty in walking, not elsewhere classified: Secondary | ICD-10-CM | POA: Diagnosis not present

## 2023-10-04 DIAGNOSIS — M6281 Muscle weakness (generalized): Secondary | ICD-10-CM | POA: Diagnosis not present

## 2023-10-04 DIAGNOSIS — R2681 Unsteadiness on feet: Secondary | ICD-10-CM

## 2023-10-04 DIAGNOSIS — R296 Repeated falls: Secondary | ICD-10-CM | POA: Diagnosis not present

## 2023-10-04 NOTE — Therapy (Signed)
[facility] Outpatient Rehabilitation at [facility] [address] [place], [place], [address] Phone: [phone]   Fax:  [phone]  Outpatient Physical Therapy Treatment   Patient Details  Name: Tara Marquez MRN: [id number] Date of Birth: Sep 29, 1941 Referring Provider (PT): Julieanne Manson MD (PCP)   Encounter Date: 10/04/2023   PT End of Session - 10/04/23 1450     Visit Number 19    Number of Visits 37    Date for PT Re-Evaluation 12/01/23    Authorization Type Medicare A&B primary with BCBS supplemental secondary    Progress Note Due on Visit 20    PT Start Time 1450    PT Stop Time 1530    PT Time Calculation (min) 40 min    Equipment Utilized During Treatment Gait belt    Activity Tolerance Patient tolerated treatment well    Behavior During Therapy WFL for tasks assessed/performed                   Past Medical History:  Diagnosis Date   Acute pyelonephritis    Adaptive colitis    Anemia    Aortic atherosclerosis (HCC)    Atrial fibrillation and flutter (HCC)    a.) CHA2DS2VASc = 5 (age x 2, sex, HTN, vascular disease history);  b.) rate/rhythm maintained on oral amiodarone + metoprolol succinate; chronically anticoagulated with dose reduced apixaban   CAD (coronary artery disease)    Depression    Descending thoracic aortic aneurysm (HCC)    a.) CT chest 09/29/2022: 4.9 cm; b.) CT abd 01/13/2023: 4.9 cm   Frequent PVCs    GERD (gastroesophageal reflux disease)    Hepatic steatosis    History of 2019 novel coronavirus disease (COVID-19) 12/04/2021   Hyperlipidemia    Hypertension    Hypokalemia    Hypothyroidism    Long term current use of amiodarone    Long term current use of anticoagulant    a.) dose reduced apixaban   Nephrolithiasis    Osteopenia    Polymyalgia rheumatica (HCC)    Skin cancer    Splenomegaly    Temporal arteritis (HCC)    Vitamin D deficiency     Past Surgical History:  Procedure Laterality  Date   ABDOMINAL HYSTERECTOMY  03/1971   Status Post Hysterectomy   CATARACT EXTRACTION Left    COLONOSCOPY     2010   COLONOSCOPY WITH PROPOFOL N/A 01/12/2022   Procedure: COLONOSCOPY WITH PROPOFOL;  Surgeon: Midge Minium, MD;  Location: [facility] ENDOSCOPY;  Service: Endoscopy;  Laterality: N/A;   CYSTOSCOPY WITH RETROGRADE PYELOGRAM, URETEROSCOPY AND STENT PLACEMENT Left 01/13/2023   Procedure: CYSTOSCOPY WITH RETROGRADE PYELOGRAM, URETEROSCOPY AND STENT PLACEMENT;  Surgeon: Riki Altes, MD;  Location: [facility] ORS;  Service: Urology;  Laterality: Left;   CYSTOSCOPY/URETEROSCOPY/HOLMIUM LASER/STENT PLACEMENT Left 02/08/2023   Procedure: CYSTOSCOPY/URETEROSCOPY/HOLMIUM LASER/STENT EXCHANGE;  Surgeon: Riki Altes, MD;  Location: [facility] ORS;  Service: Urology;  Laterality: Left;   ESOPHAGOGASTRODUODENOSCOPY N/A 11/02/2022   Procedure: ESOPHAGOGASTRODUODENOSCOPY (EGD);  Surgeon: Midge Minium, MD;  Location: [facility] ENDOSCOPY;  Service: Endoscopy;  Laterality: N/A;   EYE SURGERY     GIVENS CAPSULE STUDY N/A 12/22/2022   Procedure: GIVENS CAPSULE STUDY;  Surgeon: Midge Minium, MD;  Location: [facility] ENDOSCOPY;  Service: Endoscopy;  Laterality: N/A;   TONSILLECTOMY     TONSILLECTOMY AND ADENOIDECTOMY  1951   WISDOM TOOTH EXTRACTION  1970       Subjective Assessment     Subjective Patient states  no complaints. Was noted to have fresh bandage on the L forearm, stating that she caught the door last week and this week.     Pertinent History Tara Marquez is an 82yoF who is referred by PCP due to gradual progressive weakness since hospital admission in February 2024. Pt presents to PT eval with husband. Pt reports being in hospital for ~ a week in Feb 2024 with urinary obstruction, AKI, felt week initially but declined HHPT; pt/husband say progressively more weak over subsequent months, more difficulty with walking, husband has now asked her to use a cane. Pt reports 5 falls, 4 of which were outside on natural  surface 5th trying to clear a threshold (posterior fall buttock, back, hit head). Does get dizzy, but not frequently, not related to falls often.    How long can you sit comfortably? Not a limitation    How long can you stand comfortably? Not a limitation    How long can you walk comfortably? Mostly walks household distances, but can tolerate occasional store trips with buggy support.    Currently in Pain? No/denies               Objective measurements completed on examination: See above findings.    TODAY'S TREATMENT: 10/04/23     THEREX:   Nustep level 2-3 with BUE/BLE with cues for SPM> 45 and for full ROM.   PT applied 3# ankle weights. Variable gait training to navigate hospital hall, cement sidewalk, cement ramp, and through cafeteria. CGA for safety with min cues for step height with fatigue. Was noted to catch the RLE on nulevel crack in sidewalk x 4 throughout gait. Mulitple seated rest breaks required due to BLE fatigue. Tolerated, 197ft, 235ft 399ft x 2 through simulated community gait training.     PT Education -     Education Details Exercise technique- VC for safety    Safety in management of unlevel sidewalk to imrprove step height                      PT Short Term Goals -        PT SHORT TERM GOAL #1   Title compliant with balance HEP    Time 4    Period Weeks    Status Achieved    Target Date 08/12/23      PT SHORT TERM GOAL #2   Title Improved overground gait speed over 521ft to improve ability to access limited community distances safely.    Baseline eval: 335ft at 0.21m/s; 631ft s device 0.36m/s.   Time 4    Period Weeks    Status Achieved     Target Date 08/12/23               PT Long Term Goals -       PT LONG TERM GOAL #1   Title FOTO score improvement >12 points to indicate improve confidence in balance.    Baseline 07/19/2023=43; 08/22/23: 51; 09/08/2023 =will assess next visit   Time 12   Period Weeks    Status On  going     Target Date 12/01/2023     PT LONG TERM GOAL #2   Title Improvement in standardized balance assessment by MCID or greater.    Baseline 07/19/2023= BERG- 35/56; 08/22/23: 44   Time 8    Period Weeks    Status Achieved     Target Date 09/09/23      PT LONG TERM  GOAL #3   Title Pt to demonstrate 5xSTS hands free from standard height surface to indicated improved BLE power.    Baseline 07/19/2023= 31.07 sec without UE Support; 08/22/23: 19.15sec (hands on thighs); 09/08/2023= 16.42 sec    Time 8    Period Weeks    Status MET   Target Date 09/09/23      PT LONG TERM GOAL #4   Title Pt to demonstrate performance of 2070ft AMB >0.77m/s without increased pain and without LOB at modI level or less to restore ability to access comunity distances for IADL.    Baseline 08/22/23: 634ft in 4:02 (0.19m/s); 08/25/2023= 1200 in . 9/12= 0.62 m/s   Time 8    Period Weeks    Status Ongoing    Target Date 12/01/23         PT LONG TERM GOAL #5  Title Pt will improve BERG by at least 3 points in order to demonstrate clinically significant improvement in balance.   Baseline 09/08/2023= 46/56  Time 12  Period Weeks   Status NEW  Target Date 12/01/23       PT LONG TERM GOAL #6  Title  Pt will decrease 5TSTS by at least 3 seconds in order to demonstrate clinically significant improvement in LE strength.  Baseline 09/08/2023= 16.42 sec   Time 12  Period Weeks   Status  NEW  Target Date 12/01/23         Plan -     Clinical Impression Statement Patient presents with good motivation for today's session. PT treatment focused on functional gait training through simulated environment. Mild LOB with stepping over cracks and unlevel spots in sidewalk, but no additional assistance from PT to correct. Fatigue, requires increased rest breaks due to fatigue in BLE throughout session. PT services to address and improve her functional strength and mobility to assist in return to previous level of  function.    Personal Factors and Comorbidities Age;Fitness;Behavior Pattern;Past/Current Experience    Examination-Activity Limitations Locomotion Level;Transfers;Lift;Stairs    Examination-Participation Restrictions Meal Prep;Cleaning;Community Activity;Driving;Interpersonal Relationship;Yard Work    Stability/Clinical Decision Making Stable/Uncomplicated    Clinical Decision Making Moderate    Rehab Potential Good    PT Frequency 1-2x / week    PT Duration 12 weeks    PT Treatment/Interventions ADLs/Self Care Home Management;Gait training;Stair training;Functional mobility training;Neuromuscular re-education;Balance training;Therapeutic exercise;Therapeutic activities;Passive range of motion;Manual techniques    PT Next Visit Plan Continue with balance and therex vor home exercises.        PT Home Exercise Plan asked at eval to check and log BP twice daily,   Access Code: [email or web address] URL: [email or web address] Date: 07/21/2023 Prepared by: Grier Rocher  Exercises - Sit to Stand with Armchair  - 1 x daily - 5 x weekly - 2 sets - 6 reps - Standing Hip Abduction with Counter Support  - 1 x daily - 7 x weekly - 3 sets - 10 reps - Seated Hip Abduction with Resistance  - 1 x daily - 7 x weekly - 3 sets - 10 reps - Seated March with Resistance  - 1 x daily - 7 x weekly - 3 sets - 10 reps   Consulted and Agree with Plan of Care Patient;Family member/caregiver             Patient will benefit from skilled therapeutic intervention in order to improve the following deficits and impairments:   balance impairments, endurance deficits, reduced strength,   Visit Diagnosis: Muscle weakness (generalized)  Difficulty in walking, not elsewhere classified  Unsteadiness on feet  Repeated falls     Problem List Patient Active Problem List   Diagnosis Date Noted   Obstructive nephropathy 01/13/2023   Acute pyelonephritis 01/13/2023   AKI (acute kidney injury) (HCC)  01/13/2023   Hypokalemia 01/13/2023   Diarrhea 01/13/2023   Thoracic aortic aneurysm without rupture (HCC) 11/09/2022   Hematuria 11/05/2022   Polyp of esophagus 11/02/2022   Iron deficiency anemia 07/28/2022   Positive colorectal cancer screening using Cologuard test    Abnormal ECG 03/31/2021   Acute non-recurrent sinusitis 11/30/2020   Acute otitis media 11/30/2020   Benign essential HTN 10/31/2018   Frequent PVCs 09/08/2017   A-fib (HCC) 08/27/2015   Clinical depression 08/27/2015   Blood pressure elevated 08/27/2015   Polypharmacy 08/27/2015   Esophagitis, reflux 08/27/2015   Fatigue 08/27/2015   Acid reflux 08/27/2015   HLD (hyperlipidemia) 08/27/2015   Adult hypothyroidism 08/27/2015   Adaptive colitis 08/27/2015   Malaise and fatigue 08/27/2015   Mild major depression (HCC) 08/27/2015   Polymyalgia rheumatica (HCC) 08/27/2015   Cranial arteritis (HCC) 08/27/2015   Avitaminosis D 08/27/2015   Grier Rocher PT, DPT  Physical Therapist - [place]  [facility]  5:04 PM 10/04/23

## 2023-10-06 ENCOUNTER — Ambulatory Visit: Payer: Medicare Other | Admitting: Physical Therapy

## 2023-10-06 DIAGNOSIS — M6281 Muscle weakness (generalized): Secondary | ICD-10-CM | POA: Diagnosis not present

## 2023-10-06 DIAGNOSIS — R262 Difficulty in walking, not elsewhere classified: Secondary | ICD-10-CM | POA: Diagnosis not present

## 2023-10-06 DIAGNOSIS — R2681 Unsteadiness on feet: Secondary | ICD-10-CM | POA: Diagnosis not present

## 2023-10-06 DIAGNOSIS — R296 Repeated falls: Secondary | ICD-10-CM | POA: Diagnosis not present

## 2023-10-07 NOTE — Therapy (Signed)
[facility] Outpatient Rehabilitation at [facility] [address] [place], [place], [address] Phone: [phone]   Fax:  [phone]  Outpatient Physical Therapy Treatment   Patient Details  Name: Tara Marquez MRN: [id number] Date of Birth: 12/29/1940 Referring Provider (PT): Julieanne Manson MD (PCP)   Encounter Date: 10/06/2023   PT End of Session - 10/07/23 1142     Visit Number 20    Number of Visits 37    Date for PT Re-Evaluation 12/01/23    Authorization Type Medicare A&B primary with BCBS supplemental secondary    Progress Note Due on Visit 20    PT Start Time 1403    PT Stop Time 1445    PT Time Calculation (min) 42 min    Equipment Utilized During Treatment Gait belt    Activity Tolerance Patient tolerated treatment well    Behavior During Therapy WFL for tasks assessed/performed                   Past Medical History:  Diagnosis Date   Acute pyelonephritis    Adaptive colitis    Anemia    Aortic atherosclerosis (HCC)    Atrial fibrillation and flutter (HCC)    a.) CHA2DS2VASc = 5 (age x 2, sex, HTN, vascular disease history);  b.) rate/rhythm maintained on oral amiodarone + metoprolol succinate; chronically anticoagulated with dose reduced apixaban   CAD (coronary artery disease)    Depression    Descending thoracic aortic aneurysm (HCC)    a.) CT chest 09/29/2022: 4.9 cm; b.) CT abd 01/13/2023: 4.9 cm   Frequent PVCs    GERD (gastroesophageal reflux disease)    Hepatic steatosis    History of 2019 novel coronavirus disease (COVID-19) 12/04/2021   Hyperlipidemia    Hypertension    Hypokalemia    Hypothyroidism    Long term current use of amiodarone    Long term current use of anticoagulant    a.) dose reduced apixaban   Nephrolithiasis    Osteopenia    Polymyalgia rheumatica (HCC)    Skin cancer    Splenomegaly    Temporal arteritis (HCC)    Vitamin D deficiency     Past Surgical History:  Procedure Laterality  Date   ABDOMINAL HYSTERECTOMY  03/1971   Status Post Hysterectomy   CATARACT EXTRACTION Left    COLONOSCOPY     2010   COLONOSCOPY WITH PROPOFOL N/A 01/12/2022   Procedure: COLONOSCOPY WITH PROPOFOL;  Surgeon: Midge Minium, MD;  Location: [facility] ENDOSCOPY;  Service: Endoscopy;  Laterality: N/A;   CYSTOSCOPY WITH RETROGRADE PYELOGRAM, URETEROSCOPY AND STENT PLACEMENT Left 01/13/2023   Procedure: CYSTOSCOPY WITH RETROGRADE PYELOGRAM, URETEROSCOPY AND STENT PLACEMENT;  Surgeon: Riki Altes, MD;  Location: [facility] ORS;  Service: Urology;  Laterality: Left;   CYSTOSCOPY/URETEROSCOPY/HOLMIUM LASER/STENT PLACEMENT Left 02/08/2023   Procedure: CYSTOSCOPY/URETEROSCOPY/HOLMIUM LASER/STENT EXCHANGE;  Surgeon: Riki Altes, MD;  Location: [facility] ORS;  Service: Urology;  Laterality: Left;   ESOPHAGOGASTRODUODENOSCOPY N/A 11/02/2022   Procedure: ESOPHAGOGASTRODUODENOSCOPY (EGD);  Surgeon: Midge Minium, MD;  Location: [facility] ENDOSCOPY;  Service: Endoscopy;  Laterality: N/A;   EYE SURGERY     GIVENS CAPSULE STUDY N/A 12/22/2022   Procedure: GIVENS CAPSULE STUDY;  Surgeon: Midge Minium, MD;  Location: [facility] ENDOSCOPY;  Service: Endoscopy;  Laterality: N/A;   TONSILLECTOMY     TONSILLECTOMY AND ADENOIDECTOMY  1951   WISDOM TOOTH EXTRACTION  1970       Subjective Assessment     Subjective Patient states  no complaints. Not other update at start of PT session.    Pertinent History Tara Marquez is an 82yoF who is referred by PCP due to gradual progressive weakness since hospital admission in February 2024. Pt presents to PT eval with husband. Pt reports being in hospital for ~ a week in Feb 2024 with urinary obstruction, AKI, felt week initially but declined HHPT; pt/husband say progressively more weak over subsequent months, more difficulty with walking, husband has now asked her to use a cane. Pt reports 5 falls, 4 of which were outside on natural surface 5th trying to clear a threshold (posterior fall buttock,  back, hit head). Does get dizzy, but not frequently, not related to falls often.    How long can you sit comfortably? Not a limitation    How long can you stand comfortably? Not a limitation    How long can you walk comfortably? Mostly walks household distances, but can tolerate occasional store trips with buggy support.    Currently in Pain? No/denies               Objective measurements completed on examination: See above findings.    TODAY'S TREATMENT: 10/07/23     THEREX:   Nustep level 2-3 with BUE/BLE with cues for SPM> 45 and for full ROM. Performed x 5 minutes.   PT instructed pt in standing tolerance balance training:  Blaze pods on 4inch step and table random setting 2 x 1 min . Additional 2 rounds from airex pad x 1 min each.  Standing on airex pad  normal BOS/narrow BOS x 30 sec each.  Eyes closed/eyes open 10/30 sec each  Ball lift overhead x 15  Ball tap x 10 bil at [employer] on rail  Standing march with 1 UE suppoty. X 15 bil  Throughout session, CGA for safety and tactile cues for improved awareness of lateral and anterior LOB and use of ankle strategy to correct COM control.     PT Education -     Education Details Exercise technique- VC for safety    Safety in management of unlevel sidewalk to imrprove step height                      PT Short Term Goals -        PT SHORT TERM GOAL #1   Title compliant with balance HEP    Time 4    Period Weeks    Status Achieved    Target Date 08/12/23      PT SHORT TERM GOAL #2   Title Improved overground gait speed over 572ft to improve ability to access limited community distances safely.    Baseline eval: 352ft at 0.76m/s; 650ft s device 0.17m/s.   Time 4    Period Weeks    Status Achieved     Target Date 08/12/23               PT Long Term Goals -       PT LONG TERM GOAL #1   Title FOTO score improvement >12 points to indicate improve confidence in balance.    Baseline  07/19/2023=43; 08/22/23: 51; 09/08/2023 =will assess next visit   Time 12   Period Weeks    Status On going     Target Date 12/01/2023     PT LONG TERM GOAL #2   Title Improvement in standardized balance assessment by MCID or greater.    Baseline 07/19/2023= BERG- 35/56; 08/22/23:  44   Time 8    Period Weeks    Status Achieved     Target Date 09/09/23      PT LONG TERM GOAL #3   Title Pt to demonstrate 5xSTS hands free from standard height surface to indicated improved BLE power.    Baseline 07/19/2023= 31.07 sec without UE Support; 08/22/23: 19.15sec (hands on thighs); 09/08/2023= 16.42 sec    Time 8    Period Weeks    Status MET   Target Date 09/09/23      PT LONG TERM GOAL #4   Title Pt to demonstrate performance of 2040ft AMB >0.59m/s without increased pain and without LOB at modI level or less to restore ability to access comunity distances for IADL.    Baseline 08/22/23: 667ft in 4:02 (0.65m/s); 08/25/2023= 1200 in . 9/12= 0.62 m/s   Time 8    Period Weeks    Status Ongoing    Target Date 12/01/23         PT LONG TERM GOAL #5  Title Pt will improve BERG by at least 3 points in order to demonstrate clinically significant improvement in balance.   Baseline 09/08/2023= 46/56  Time 12  Period Weeks   Status NEW  Target Date 12/01/23       PT LONG TERM GOAL #6  Title  Pt will decrease 5TSTS by at least 3 seconds in order to demonstrate clinically significant improvement in LE strength.  Baseline 09/08/2023= 16.42 sec   Time 12  Period Weeks   Status  NEW  Target Date 12/01/23         Plan -     Clinical Impression Statement Patient presents with good motivation for today's session. PT treatment focused on dynamic balance training and improved use of ankle strategy to control COM with lateral and anterior LOB.  PT services to address and improve her functional strength and mobility to assist in return to previous level of function.    Personal Factors and  Comorbidities Age;Fitness;Behavior Pattern;Past/Current Experience    Examination-Activity Limitations Locomotion Level;Transfers;Lift;Stairs    Examination-Participation Restrictions Meal Prep;Cleaning;Community Activity;Driving;Interpersonal Relationship;Yard Work    Stability/Clinical Decision Making Stable/Uncomplicated    Clinical Decision Making Moderate    Rehab Potential Good    PT Frequency 1-2x / week    PT Duration 12 weeks    PT Treatment/Interventions ADLs/Self Care Home Management;Gait training;Stair training;Functional mobility training;Neuromuscular re-education;Balance training;Therapeutic exercise;Therapeutic activities;Passive range of motion;Manual techniques    PT Next Visit Plan Continue with balance and therex vor home exercises.        PT Home Exercise Plan asked at eval to check and log BP twice daily,   Access Code: [email or web address] URL: [email or web address] Date: 07/21/2023 Prepared by: Grier Rocher  Exercises - Sit to Stand with Armchair  - 1 x daily - 5 x weekly - 2 sets - 6 reps - Standing Hip Abduction with Counter Support  - 1 x daily - 7 x weekly - 3 sets - 10 reps - Seated Hip Abduction with Resistance  - 1 x daily - 7 x weekly - 3 sets - 10 reps - Seated March with Resistance  - 1 x daily - 7 x weekly - 3 sets - 10 reps   Consulted and Agree with Plan of Care Patient;Family member/caregiver             Patient will benefit from skilled therapeutic intervention in order to improve the following deficits and impairments:   balance impairments,  endurance deficits, reduced strength,   Visit Diagnosis: Muscle weakness (generalized)  Difficulty in walking, not elsewhere classified  Unsteadiness on feet  Repeated falls     Problem List Patient Active Problem List   Diagnosis Date Noted   Obstructive nephropathy 01/13/2023   Acute pyelonephritis 01/13/2023   AKI (acute kidney injury) (HCC) 01/13/2023   Hypokalemia 01/13/2023    Diarrhea 01/13/2023   Thoracic aortic aneurysm without rupture (HCC) 11/09/2022   Hematuria 11/05/2022   Polyp of esophagus 11/02/2022   Iron deficiency anemia 07/28/2022   Positive colorectal cancer screening using Cologuard test    Abnormal ECG 03/31/2021   Acute non-recurrent sinusitis 11/30/2020   Acute otitis media 11/30/2020   Benign essential HTN 10/31/2018   Frequent PVCs 09/08/2017   A-fib (HCC) 08/27/2015   Clinical depression 08/27/2015   Blood pressure elevated 08/27/2015   Polypharmacy 08/27/2015   Esophagitis, reflux 08/27/2015   Fatigue 08/27/2015   Acid reflux 08/27/2015   HLD (hyperlipidemia) 08/27/2015   Adult hypothyroidism 08/27/2015   Adaptive colitis 08/27/2015   Malaise and fatigue 08/27/2015   Mild major depression (HCC) 08/27/2015   Polymyalgia rheumatica (HCC) 08/27/2015   Cranial arteritis (HCC) 08/27/2015   Avitaminosis D 08/27/2015   Grier Rocher PT, DPT  Physical Therapist - [place]  [facility]  11:43 AM 10/07/23

## 2023-10-11 ENCOUNTER — Ambulatory Visit: Payer: Medicare Other

## 2023-10-13 ENCOUNTER — Ambulatory Visit: Payer: Medicare Other | Admitting: Physical Therapy

## 2023-10-13 DIAGNOSIS — R296 Repeated falls: Secondary | ICD-10-CM | POA: Diagnosis not present

## 2023-10-13 DIAGNOSIS — R2681 Unsteadiness on feet: Secondary | ICD-10-CM | POA: Diagnosis not present

## 2023-10-13 DIAGNOSIS — R262 Difficulty in walking, not elsewhere classified: Secondary | ICD-10-CM | POA: Diagnosis not present

## 2023-10-13 DIAGNOSIS — M6281 Muscle weakness (generalized): Secondary | ICD-10-CM

## 2023-10-13 NOTE — Therapy (Signed)
[facility] Outpatient Rehabilitation at [facility] [address] [place], [place], [address] Phone: [phone]   Fax:  [phone]  Outpatient Physical Therapy Treatment   Patient Details  Name: Tara Marquez MRN: [id number] Date of Birth: 1941-09-19 Referring Provider (PT): Julieanne Manson MD (PCP)   Encounter Date: 10/13/2023   PT End of Session - 10/13/23 1359     Visit Number 21    Number of Visits 37    Date for PT Re-Evaluation 12/01/23    Authorization Type Medicare A&B primary with BCBS supplemental secondary    Progress Note Due on Visit 20    PT Start Time 1405    PT Stop Time 1445    PT Time Calculation (min) 40 min    Equipment Utilized During Treatment Gait belt    Activity Tolerance Patient tolerated treatment well    Behavior During Therapy WFL for tasks assessed/performed                   Past Medical History:  Diagnosis Date   Acute pyelonephritis    Adaptive colitis    Anemia    Aortic atherosclerosis (HCC)    Atrial fibrillation and flutter (HCC)    a.) CHA2DS2VASc = 5 (age x 2, sex, HTN, vascular disease history);  b.) rate/rhythm maintained on oral amiodarone + metoprolol succinate; chronically anticoagulated with dose reduced apixaban   CAD (coronary artery disease)    Depression    Descending thoracic aortic aneurysm (HCC)    a.) CT chest 09/29/2022: 4.9 cm; b.) CT abd 01/13/2023: 4.9 cm   Frequent PVCs    GERD (gastroesophageal reflux disease)    Hepatic steatosis    History of 2019 novel coronavirus disease (COVID-19) 12/04/2021   Hyperlipidemia    Hypertension    Hypokalemia    Hypothyroidism    Long term current use of amiodarone    Long term current use of anticoagulant    a.) dose reduced apixaban   Nephrolithiasis    Osteopenia    Polymyalgia rheumatica (HCC)    Skin cancer    Splenomegaly    Temporal arteritis (HCC)    Vitamin D deficiency     Past Surgical History:  Procedure Laterality  Date   ABDOMINAL HYSTERECTOMY  03/1971   Status Post Hysterectomy   CATARACT EXTRACTION Left    COLONOSCOPY     2010   COLONOSCOPY WITH PROPOFOL N/A 01/12/2022   Procedure: COLONOSCOPY WITH PROPOFOL;  Surgeon: Midge Minium, MD;  Location: [facility] ENDOSCOPY;  Service: Endoscopy;  Laterality: N/A;   CYSTOSCOPY WITH RETROGRADE PYELOGRAM, URETEROSCOPY AND STENT PLACEMENT Left 01/13/2023   Procedure: CYSTOSCOPY WITH RETROGRADE PYELOGRAM, URETEROSCOPY AND STENT PLACEMENT;  Surgeon: Riki Altes, MD;  Location: [facility] ORS;  Service: Urology;  Laterality: Left;   CYSTOSCOPY/URETEROSCOPY/HOLMIUM LASER/STENT PLACEMENT Left 02/08/2023   Procedure: CYSTOSCOPY/URETEROSCOPY/HOLMIUM LASER/STENT EXCHANGE;  Surgeon: Riki Altes, MD;  Location: [facility] ORS;  Service: Urology;  Laterality: Left;   ESOPHAGOGASTRODUODENOSCOPY N/A 11/02/2022   Procedure: ESOPHAGOGASTRODUODENOSCOPY (EGD);  Surgeon: Midge Minium, MD;  Location: [facility] ENDOSCOPY;  Service: Endoscopy;  Laterality: N/A;   EYE SURGERY     GIVENS CAPSULE STUDY N/A 12/22/2022   Procedure: GIVENS CAPSULE STUDY;  Surgeon: Midge Minium, MD;  Location: [facility] ENDOSCOPY;  Service: Endoscopy;  Laterality: N/A;   TONSILLECTOMY     TONSILLECTOMY AND ADENOIDECTOMY  1951   WISDOM TOOTH EXTRACTION  1970       Subjective Assessment     Subjective Patient states  no complaints. Not other update at start of PT session.    Pertinent History Tara Marquez is an 82yoF who is referred by PCP due to gradual progressive weakness since hospital admission in February 2024. Pt presents to PT eval with husband. Pt reports being in hospital for ~ a week in Feb 2024 with urinary obstruction, AKI, felt week initially but declined HHPT; pt/husband say progressively more weak over subsequent months, more difficulty with walking, husband has now asked her to use a cane. Pt reports 5 falls, 4 of which were outside on natural surface 5th trying to clear a threshold (posterior fall buttock,  back, hit head). Does get dizzy, but not frequently, not related to falls often.    How long can you sit comfortably? Not a limitation    How long can you stand comfortably? Not a limitation    How long can you walk comfortably? Mostly walks household distances, but can tolerate occasional store trips with buggy support.    Currently in Pain? No/denies               Objective measurements completed on examination: See above findings.    TODAY'S TREATMENT: 10/13/23     THEREX:   Nustep level 2-3 with BUE/BLE with cues for SPM> 45 and for full ROM. Performed x 5 minutes.   On half bolster:  Heel raise x 20  Toe raise x 20   On airex pad:  Foot tap on 6 inch step x 12 bil  1 foot on 6 inch step 15 sec x 2 bil  Foot tap on 1 of 3 spike balls x 10 bil  Foot tap on 2 of 3 spike ball x 10 reps bil  Sit<>stand with no UE support 2 x 8 Lateral foot tap on 6 inch step x 8 bil   Step up/down 6 inch step x 8 bil  forward  Lateral step up on 6 inch step x 8 bil    CGA/min assist on airex pad to prevent anterior LOB with cues for improved WB through heels as well as use of ankle strategy to prevent.      PT Education -     Education Details Exercise technique- VC for safety    Safety in management of unlevel sidewalk to imrprove step height                      PT Short Term Goals -        PT SHORT TERM GOAL #1   Title compliant with balance HEP    Time 4    Period Weeks    Status Achieved    Target Date 08/12/23      PT SHORT TERM GOAL #2   Title Improved overground gait speed over 571ft to improve ability to access limited community distances safely.    Baseline eval: 320ft at 0.63m/s; 684ft s device 0.32m/s.   Time 4    Period Weeks    Status Achieved     Target Date 08/12/23               PT Long Term Goals -       PT LONG TERM GOAL #1   Title FOTO score improvement >12 points to indicate improve confidence in balance.    Baseline  07/19/2023=43; 08/22/23: 51; 09/08/2023 =will assess next visit   Time 12   Period Weeks    Status On going     Target  Date 12/01/2023     PT LONG TERM GOAL #2   Title Improvement in standardized balance assessment by MCID or greater.    Baseline 07/19/2023= BERG- 35/56; 08/22/23: 44   Time 8    Period Weeks    Status Achieved     Target Date 09/09/23      PT LONG TERM GOAL #3   Title Pt to demonstrate 5xSTS hands free from standard height surface to indicated improved BLE power.    Baseline 07/19/2023= 31.07 sec without UE Support; 08/22/23: 19.15sec (hands on thighs); 09/08/2023= 16.42 sec    Time 8    Period Weeks    Status MET   Target Date 09/09/23      PT LONG TERM GOAL #4   Title Pt to demonstrate performance of 2024ft AMB >0.5m/s without increased pain and without LOB at modI level or less to restore ability to access comunity distances for IADL.    Baseline 08/22/23: 633ft in 4:02 (0.15m/s); 08/25/2023= 1200 in . 9/12= 0.62 m/s   Time 8    Period Weeks    Status Ongoing    Target Date 12/01/23         PT LONG TERM GOAL #5  Title Pt will improve BERG by at least 3 points in order to demonstrate clinically significant improvement in balance.   Baseline 09/08/2023= 46/56  Time 12  Period Weeks   Status NEW  Target Date 12/01/23       PT LONG TERM GOAL #6  Title  Pt will decrease 5TSTS by at least 3 seconds in order to demonstrate clinically significant improvement in LE strength.  Baseline 09/08/2023= 16.42 sec   Time 12  Period Weeks   Status  NEW  Target Date 12/01/23         Plan -     Clinical Impression Statement Patient presents with good motivation for today's session. PT treatment focused on dynamic balance training and improved use of ankle strategy to control COM with lateral and anterior LOB. Mild improvement within session to demonstrated within session. Pt will benefit from continued .  PT services to address and improve her functional strength and  mobility to assist in return to previous level of function.    Personal Factors and Comorbidities Age;Fitness;Behavior Pattern;Past/Current Experience    Examination-Activity Limitations Locomotion Level;Transfers;Lift;Stairs    Examination-Participation Restrictions Meal Prep;Cleaning;Community Activity;Driving;Interpersonal Relationship;Yard Work    Stability/Clinical Decision Making Stable/Uncomplicated    Clinical Decision Making Moderate    Rehab Potential Good    PT Frequency 1-2x / week    PT Duration 12 weeks    PT Treatment/Interventions ADLs/Self Care Home Management;Gait training;Stair training;Functional mobility training;Neuromuscular re-education;Balance training;Therapeutic exercise;Therapeutic activities;Passive range of motion;Manual techniques    PT Next Visit Plan Continue with balance and therex vor home exercises.        PT Home Exercise Plan asked at eval to check and log BP twice daily,   Access Code: [email or web address] URL: [email or web address] Date: 07/21/2023 Prepared by: Grier Rocher  Exercises - Sit to Stand with Armchair  - 1 x daily - 5 x weekly - 2 sets - 6 reps - Standing Hip Abduction with Counter Support  - 1 x daily - 7 x weekly - 3 sets - 10 reps - Seated Hip Abduction with Resistance  - 1 x daily - 7 x weekly - 3 sets - 10 reps - Seated March with Resistance  - 1 x daily - 7 x weekly - 3 sets -  10 reps   Consulted and Agree with Plan of Care Patient;Family member/caregiver             Patient will benefit from skilled therapeutic intervention in order to improve the following deficits and impairments:   balance impairments, endurance deficits, reduced strength,   Visit Diagnosis: Muscle weakness (generalized)  Difficulty in walking, not elsewhere classified  Unsteadiness on feet  Repeated falls     Problem List Patient Active Problem List   Diagnosis Date Noted   Obstructive nephropathy 01/13/2023   Acute pyelonephritis  01/13/2023   AKI (acute kidney injury) (HCC) 01/13/2023   Hypokalemia 01/13/2023   Diarrhea 01/13/2023   Thoracic aortic aneurysm without rupture (HCC) 11/09/2022   Hematuria 11/05/2022   Polyp of esophagus 11/02/2022   Iron deficiency anemia 07/28/2022   Positive colorectal cancer screening using Cologuard test    Abnormal ECG 03/31/2021   Acute non-recurrent sinusitis 11/30/2020   Acute otitis media 11/30/2020   Benign essential HTN 10/31/2018   Frequent PVCs 09/08/2017   A-fib (HCC) 08/27/2015   Clinical depression 08/27/2015   Blood pressure elevated 08/27/2015   Polypharmacy 08/27/2015   Esophagitis, reflux 08/27/2015   Fatigue 08/27/2015   Acid reflux 08/27/2015   HLD (hyperlipidemia) 08/27/2015   Adult hypothyroidism 08/27/2015   Adaptive colitis 08/27/2015   Malaise and fatigue 08/27/2015   Mild major depression (HCC) 08/27/2015   Polymyalgia rheumatica (HCC) 08/27/2015   Cranial arteritis (HCC) 08/27/2015   Avitaminosis D 08/27/2015   Grier Rocher PT, DPT  Physical Therapist - [place]  [facility]  2:02 PM 10/13/23

## 2023-10-18 ENCOUNTER — Ambulatory Visit: Payer: Medicare Other

## 2023-10-18 DIAGNOSIS — R2681 Unsteadiness on feet: Secondary | ICD-10-CM

## 2023-10-18 DIAGNOSIS — R296 Repeated falls: Secondary | ICD-10-CM

## 2023-10-18 DIAGNOSIS — R262 Difficulty in walking, not elsewhere classified: Secondary | ICD-10-CM | POA: Diagnosis not present

## 2023-10-18 DIAGNOSIS — M6281 Muscle weakness (generalized): Secondary | ICD-10-CM | POA: Diagnosis not present

## 2023-10-18 NOTE — Therapy (Signed)
[facility] Outpatient Rehabilitation at [facility] [address] [place], [place], [address] Phone: [phone]   Fax:  [phone]  Outpatient Physical Therapy Treatment   Patient Details  Name: Tara Marquez MRN: [id number] Date of Birth: Aug 15, 1941 Referring Provider (PT): Tara Manson MD (PCP)   Encounter Date: 10/18/2023   PT End of Session - 10/18/23 1302     Visit Number 22    Number of Visits 37    Date for PT Re-Evaluation 12/01/23    Authorization Type Medicare A&B primary with BCBS supplemental secondary    Progress Note Due on Visit 20    PT Start Time 1508    PT Stop Time 1554    PT Time Calculation (min) 46 min    Equipment Utilized During Treatment Gait belt    Activity Tolerance Patient tolerated treatment well    Behavior During Therapy WFL for tasks assessed/performed                    Past Medical History:  Diagnosis Date   Acute pyelonephritis    Adaptive colitis    Anemia    Aortic atherosclerosis (HCC)    Atrial fibrillation and flutter (HCC)    a.) CHA2DS2VASc = 5 (age x 2, sex, HTN, vascular disease history);  b.) rate/rhythm maintained on oral amiodarone + metoprolol succinate; chronically anticoagulated with dose reduced apixaban   CAD (coronary artery disease)    Depression    Descending thoracic aortic aneurysm (HCC)    a.) CT chest 09/29/2022: 4.9 cm; b.) CT abd 01/13/2023: 4.9 cm   Frequent PVCs    GERD (gastroesophageal reflux disease)    Hepatic steatosis    History of 2019 novel coronavirus disease (COVID-19) 12/04/2021   Hyperlipidemia    Hypertension    Hypokalemia    Hypothyroidism    Long term current use of amiodarone    Long term current use of anticoagulant    a.) dose reduced apixaban   Nephrolithiasis    Osteopenia    Polymyalgia rheumatica (HCC)    Skin cancer    Splenomegaly    Temporal arteritis (HCC)    Vitamin D deficiency     Past Surgical History:  Procedure  Laterality Date   ABDOMINAL HYSTERECTOMY  03/1971   Status Post Hysterectomy   CATARACT EXTRACTION Left    COLONOSCOPY     2010   COLONOSCOPY WITH PROPOFOL N/A 01/12/2022   Procedure: COLONOSCOPY WITH PROPOFOL;  Surgeon: Midge Minium, MD;  Location: [facility] ENDOSCOPY;  Service: Endoscopy;  Laterality: N/A;   CYSTOSCOPY WITH RETROGRADE PYELOGRAM, URETEROSCOPY AND STENT PLACEMENT Left 01/13/2023   Procedure: CYSTOSCOPY WITH RETROGRADE PYELOGRAM, URETEROSCOPY AND STENT PLACEMENT;  Surgeon: Riki Altes, MD;  Location: [facility] ORS;  Service: Urology;  Laterality: Left;   CYSTOSCOPY/URETEROSCOPY/HOLMIUM LASER/STENT PLACEMENT Left 02/08/2023   Procedure: CYSTOSCOPY/URETEROSCOPY/HOLMIUM LASER/STENT EXCHANGE;  Surgeon: Riki Altes, MD;  Location: [facility] ORS;  Service: Urology;  Laterality: Left;   ESOPHAGOGASTRODUODENOSCOPY N/A 11/02/2022   Procedure: ESOPHAGOGASTRODUODENOSCOPY (EGD);  Surgeon: Midge Minium, MD;  Location: [facility] ENDOSCOPY;  Service: Endoscopy;  Laterality: N/A;   EYE SURGERY     GIVENS CAPSULE STUDY N/A 12/22/2022   Procedure: GIVENS CAPSULE STUDY;  Surgeon: Midge Minium, MD;  Location: [facility] ENDOSCOPY;  Service: Endoscopy;  Laterality: N/A;   TONSILLECTOMY     TONSILLECTOMY AND ADENOIDECTOMY  1951   WISDOM TOOTH EXTRACTION  1970       Subjective Assessment     Subjective Patient  reports no issues. States she thinks she is some stronger. Patient states no complaints. Not other update at start of PT session.    Pertinent History Tara Marquez is an 82yoF who is referred by PCP due to gradual progressive weakness since hospital admission in February 2024. Pt presents to PT eval with husband. Pt reports being in hospital for ~ a week in Feb 2024 with urinary obstruction, AKI, felt week initially but declined HHPT; pt/husband say progressively more weak over subsequent months, more difficulty with walking, husband has now asked her to use a cane. Pt reports 5 falls, 4 of which were outside  on natural surface 5th trying to clear a threshold (posterior fall buttock, back, hit head). Does get dizzy, but not frequently, not related to falls often.    How long can you sit comfortably? Not a limitation    How long can you stand comfortably? Not a limitation    How long can you walk comfortably? Mostly walks household distances, but can tolerate occasional store trips with buggy support.    Currently in Pain? No/denies               Objective measurements completed on examination: See above findings.    TODAY'S TREATMENT: 10/19/23     THEREX:   Sit to stand without UE support x 10 reps.  Step tap onto 12" block x 12 reps (6 without UE support) using 2# AW Side step up/onto 6" block x 10 reps Donkey kick 2# AW x 10 reps each LE Standing hip ext 2# AW x 10 reps each LE Hip circles - CW around 12" block  x 10 reps each LE Single leg RDL- reaching down to pick up cone off 6" block placed vertical x 10 reps each LE and then 10 reps horizontally placed  each LE On incline at // bars: Heel raise x 20 with 3# AW   .      PT Education -     Education Details Exercise technique- VC for safety    Safety in management of unlevel sidewalk to imrprove step height                      PT Short Term Goals -        PT SHORT TERM GOAL #1   Title compliant with balance HEP    Time 4    Period Weeks    Status Achieved    Target Date 08/12/23      PT SHORT TERM GOAL #2   Title Improved overground gait speed over 548ft to improve ability to access limited community distances safely.    Baseline eval: 376ft at 0.73m/s; 648ft s device 0.57m/s.   Time 4    Period Weeks    Status Achieved     Target Date 08/12/23               PT Long Term Goals -       PT LONG TERM GOAL #1   Title FOTO score improvement >12 points to indicate improve confidence in balance.    Baseline 07/19/2023=43; 08/22/23: 51; 09/08/2023 =will assess next visit; 10/22= 52   Time 12    Period Weeks    Status On going     Target Date 12/01/2023     PT LONG TERM GOAL #2   Title Improvement in standardized balance assessment by MCID or greater.    Baseline 07/19/2023= BERG- 35/56; 08/22/23: 44  Time 8    Period Weeks    Status Achieved     Target Date 09/09/23      PT LONG TERM GOAL #3   Title Pt to demonstrate 5xSTS hands free from standard height surface to indicated improved BLE power.    Baseline 07/19/2023= 31.07 sec without UE Support; 08/22/23: 19.15sec (hands on thighs); 09/08/2023= 16.42 sec    Time 8    Period Weeks    Status MET   Target Date 09/09/23      PT LONG TERM GOAL #4   Title Pt to demonstrate performance of 2030ft AMB >0.48m/s without increased pain and without LOB at modI level or less to restore ability to access comunity distances for IADL.    Baseline 08/22/23: 676ft in 4:02 (0.56m/s); 08/25/2023= 1200 in . 9/12= 0.62 m/s   Time 8    Period Weeks    Status Ongoing    Target Date 12/01/23         PT LONG TERM GOAL #5  Title Pt will improve BERG by at least 3 points in order to demonstrate clinically significant improvement in balance.   Baseline 09/08/2023= 46/56  Time 12  Period Weeks   Status NEW  Target Date 12/01/23       PT LONG TERM GOAL #6  Title  Pt will decrease 5TSTS by at least 3 seconds in order to demonstrate clinically significant improvement in LE strength.  Baseline 09/08/2023= 16.42 sec   Time 12  Period Weeks   Status  NEW  Target Date 12/01/23         Plan -     Clinical Impression Statement Treatment continued per plan of care focusing on LE strengthening and balance. She continues to progress overall standing endurance with all therex activities today. She is demonstrating much improved sit to stand without UE support and responsive to all verbal cues for exercise technique. Specific focus on some hip strengthening exercises designed to improve functional hip strength with standing and dynamic balance.  Pt  will benefit from continued .  PT services to address and improve her functional strength and mobility to assist in return to previous level of function.    Personal Factors and Comorbidities Age;Fitness;Behavior Pattern;Past/Current Experience    Examination-Activity Limitations Locomotion Level;Transfers;Lift;Stairs    Examination-Participation Restrictions Meal Prep;Cleaning;Community Activity;Driving;Interpersonal Relationship;Yard Work    Stability/Clinical Decision Making Stable/Uncomplicated    Clinical Decision Making Moderate    Rehab Potential Good    PT Frequency 1-2x / week    PT Duration 12 weeks    PT Treatment/Interventions ADLs/Self Care Home Management;Gait training;Stair training;Functional mobility training;Neuromuscular re-education;Balance training;Therapeutic exercise;Therapeutic activities;Passive range of motion;Manual techniques    PT Next Visit Plan Continue with balance and therex  for improved strength and balance         PT Home Exercise Plan asked at eval to check and log BP twice daily,   Access Code: [email or web address] URL: [email or web address] Date: 07/21/2023 Prepared by: Grier Rocher  Exercises - Sit to Stand with Armchair  - 1 x daily - 5 x weekly - 2 sets - 6 reps - Standing Hip Abduction with Counter Support  - 1 x daily - 7 x weekly - 3 sets - 10 reps - Seated Hip Abduction with Resistance  - 1 x daily - 7 x weekly - 3 sets - 10 reps - Seated March with Resistance  - 1 x daily - 7 x weekly - 3 sets - 10 reps  Consulted and Agree with Plan of Care Patient;Family member/caregiver             Patient will benefit from skilled therapeutic intervention in order to improve the following deficits and impairments:   balance impairments, endurance deficits, reduced strength,   Visit Diagnosis: Muscle weakness (generalized)  Difficulty in walking, not elsewhere classified  Unsteadiness on feet  Repeated falls     Problem  List Patient Active Problem List   Diagnosis Date Noted   Obstructive nephropathy 01/13/2023   Acute pyelonephritis 01/13/2023   AKI (acute kidney injury) (HCC) 01/13/2023   Hypokalemia 01/13/2023   Diarrhea 01/13/2023   Thoracic aortic aneurysm without rupture (HCC) 11/09/2022   Hematuria 11/05/2022   Polyp of esophagus 11/02/2022   Iron deficiency anemia 07/28/2022   Positive colorectal cancer screening using Cologuard test    Abnormal ECG 03/31/2021   Acute non-recurrent sinusitis 11/30/2020   Acute otitis media 11/30/2020   Benign essential HTN 10/31/2018   Frequent PVCs 09/08/2017   A-fib (HCC) 08/27/2015   Clinical depression 08/27/2015   Blood pressure elevated 08/27/2015   Polypharmacy 08/27/2015   Esophagitis, reflux 08/27/2015   Fatigue 08/27/2015   Acid reflux 08/27/2015   HLD (hyperlipidemia) 08/27/2015   Adult hypothyroidism 08/27/2015   Adaptive colitis 08/27/2015   Malaise and fatigue 08/27/2015   Mild major depression (HCC) 08/27/2015   Polymyalgia rheumatica (HCC) 08/27/2015   Cranial arteritis (HCC) 08/27/2015   Avitaminosis D 08/27/2015   Louis Meckel, PT Physical Therapist - [place]  [facility]  2:56 PM 10/19/23

## 2023-10-20 ENCOUNTER — Ambulatory Visit: Payer: Medicare Other

## 2023-10-20 DIAGNOSIS — R2681 Unsteadiness on feet: Secondary | ICD-10-CM | POA: Diagnosis not present

## 2023-10-20 DIAGNOSIS — R262 Difficulty in walking, not elsewhere classified: Secondary | ICD-10-CM | POA: Diagnosis not present

## 2023-10-20 DIAGNOSIS — M6281 Muscle weakness (generalized): Secondary | ICD-10-CM | POA: Diagnosis not present

## 2023-10-20 DIAGNOSIS — R296 Repeated falls: Secondary | ICD-10-CM | POA: Diagnosis not present

## 2023-10-20 NOTE — Therapy (Signed)
[facility] Outpatient Rehabilitation at [facility] [address] [place], [place], [address] Phone: [phone]   Fax:  [phone]  Outpatient Physical Therapy Treatment   Patient Details  Name: Tara Marquez MRN: [id number] Date of Birth: [birth year] Referring Provider (PT): Julieanne Manson MD (PCP)   Encounter Date: 10/20/2023   PT End of Session - 10/20/23 1400     Visit Number 23    Number of Visits 37    Date for PT Re-Evaluation 12/01/23    Authorization Type Medicare A&B primary with BCBS supplemental secondary    Progress Note Due on Visit 30    PT Start Time 1400    PT Stop Time 1443    PT Time Calculation (min) 43 min    Equipment Utilized During Treatment Gait belt    Activity Tolerance Patient tolerated treatment well    Behavior During Therapy WFL for tasks assessed/performed                    Past Medical History:  Diagnosis Date   Acute pyelonephritis    Adaptive colitis    Anemia    Aortic atherosclerosis (HCC)    Atrial fibrillation and flutter (HCC)    a.) CHA2DS2VASc = 5 (age x 2, sex, HTN, vascular disease history);  b.) rate/rhythm maintained on oral amiodarone + metoprolol succinate; chronically anticoagulated with dose reduced apixaban   CAD (coronary artery disease)    Depression    Descending thoracic aortic aneurysm (HCC)    a.) CT chest 09/29/2022: 4.9 cm; b.) CT abd 01/13/2023: 4.9 cm   Frequent PVCs    GERD (gastroesophageal reflux disease)    Hepatic steatosis    History of 2019 novel coronavirus disease (COVID-19) 12/04/2021   Hyperlipidemia    Hypertension    Hypokalemia    Hypothyroidism    Long term current use of amiodarone    Long term current use of anticoagulant    a.) dose reduced apixaban   Nephrolithiasis    Osteopenia    Polymyalgia rheumatica (HCC)    Skin cancer    Splenomegaly    Temporal arteritis (HCC)    Vitamin D deficiency     Past Surgical History:  Procedure  Laterality Date   ABDOMINAL HYSTERECTOMY  03/1971   Status Post Hysterectomy   CATARACT EXTRACTION Left    COLONOSCOPY     2010   COLONOSCOPY WITH PROPOFOL N/A 01/12/2022   Procedure: COLONOSCOPY WITH PROPOFOL;  Surgeon: Midge Minium, MD;  Location: [facility] ENDOSCOPY;  Service: Endoscopy;  Laterality: N/A;   CYSTOSCOPY WITH RETROGRADE PYELOGRAM, URETEROSCOPY AND STENT PLACEMENT Left 01/13/2023   Procedure: CYSTOSCOPY WITH RETROGRADE PYELOGRAM, URETEROSCOPY AND STENT PLACEMENT;  Surgeon: Riki Altes, MD;  Location: [facility] ORS;  Service: Urology;  Laterality: Left;   CYSTOSCOPY/URETEROSCOPY/HOLMIUM LASER/STENT PLACEMENT Left 02/08/2023   Procedure: CYSTOSCOPY/URETEROSCOPY/HOLMIUM LASER/STENT EXCHANGE;  Surgeon: Riki Altes, MD;  Location: [facility] ORS;  Service: Urology;  Laterality: Left;   ESOPHAGOGASTRODUODENOSCOPY N/A 11/02/2022   Procedure: ESOPHAGOGASTRODUODENOSCOPY (EGD);  Surgeon: Midge Minium, MD;  Location: [facility] ENDOSCOPY;  Service: Endoscopy;  Laterality: N/A;   EYE SURGERY     GIVENS CAPSULE STUDY N/A 12/22/2022   Procedure: GIVENS CAPSULE STUDY;  Surgeon: Midge Minium, MD;  Location: [facility] ENDOSCOPY;  Service: Endoscopy;  Laterality: N/A;   TONSILLECTOMY     TONSILLECTOMY AND ADENOIDECTOMY  1951   WISDOM TOOTH EXTRACTION  1970       Subjective Assessment     Subjective Pt  reported no changes since last visit. Pt reported 0/10 on NPS.    Pertinent History Tara Marquez is an 82yoF who is referred by PCP due to gradual progressive weakness since hospital admission in February 2024. Pt presents to PT eval with husband. Pt reports being in hospital for ~ a week in Feb 2024 with urinary obstruction, AKI, felt week initially but declined HHPT; pt/husband say progressively more weak over subsequent months, more difficulty with walking, husband has now asked her to use a cane. Pt reports 5 falls, 4 of which were outside on natural surface 5th trying to clear a threshold (posterior fall  buttock, back, hit head). Does get dizzy, but not frequently, not related to falls often.    How long can you sit comfortably? Not a limitation    How long can you stand comfortably? Not a limitation    How long can you walk comfortably? Mostly walks household distances, but can tolerate occasional store trips with buggy support.    Currently in Pain? No/denies               Objective measurements completed on examination: See above findings.    TODAY'S TREATMENT: 10/20/23 Standing marches with UE support 3# AW x 15 reps each LE with UE Support Donkey kick 3# AW x 12 reps each LE with UE Support Side stepping over orange hurdle 3# AW x 12 reps each side with UE support  LAQ 3# AW 2x12 each side with 2 sec hold on 2nd set   Activity Description: Lined up 5 pods in a linear line  Activity Setting:  The Blaze Pod Random setting was chosen to enhance cognitive processing and agility, providing an unpredictable environment to simulate real-world scenarios, and fostering quick reactions and adaptability.   Number of Pods:  5 Cycles/Sets:  3 Duration (Time or Hit Count):  10 hits with 3# AW Patient Stats  Reaction Time:  Round 1: 27.23 seconds, Round 2: 27.99 seconds, Round 3: 27.64 seconds  Pt reported "head swimming" after 3rd attempt  BP: 156/59 in sitting on L arm, 137/79 in standing on L arm- asymptomatic    .      PT Education -     Education Details Exercise technique- VC for safety    Safety in management of unlevel sidewalk to imrprove step height                      PT Short Term Goals -        PT SHORT TERM GOAL #1   Title compliant with balance HEP    Time 4    Period Weeks    Status Achieved    Target Date 08/12/23      PT SHORT TERM GOAL #2   Title Improved overground gait speed over 550ft to improve ability to access limited community distances safely.    Baseline eval: 364ft at 0.31m/s; 612ft s device 0.47m/s.   Time 4    Period Weeks     Status Achieved     Target Date 08/12/23               PT Long Term Goals -       PT LONG TERM GOAL #1   Title FOTO score improvement >12 points to indicate improve confidence in balance.    Baseline 07/19/2023=43; 08/22/23: 51; 09/08/2023 =will assess next visit; 10/22= 52   Time 12   Period Weeks    Status On  going     Target Date 12/01/2023     PT LONG TERM GOAL #2   Title Improvement in standardized balance assessment by MCID or greater.    Baseline 07/19/2023= BERG- 35/56; 08/22/23: 44   Time 8    Period Weeks    Status Achieved     Target Date 09/09/23      PT LONG TERM GOAL #3   Title Pt to demonstrate 5xSTS hands free from standard height surface to indicated improved BLE power.    Baseline 07/19/2023= 31.07 sec without UE Support; 08/22/23: 19.15sec (hands on thighs); 09/08/2023= 16.42 sec    Time 8    Period Weeks    Status MET   Target Date 09/09/23      PT LONG TERM GOAL #4   Title Pt to demonstrate performance of 2042ft AMB >0.40m/s without increased pain and without LOB at modI level or less to restore ability to access comunity distances for IADL.    Baseline 08/22/23: 643ft in 4:02 (0.63m/s); 08/25/2023= 1200 in . 9/12= 0.62 m/s   Time 8    Period Weeks    Status Ongoing    Target Date 12/01/23         PT LONG TERM GOAL #5  Title Pt will improve BERG by at least 3 points in order to demonstrate clinically significant improvement in balance.   Baseline 09/08/2023= 46/56  Time 12  Period Weeks   Status NEW  Target Date 12/01/23       PT LONG TERM GOAL #6  Title  Pt will decrease 5TSTS by at least 3 seconds in order to demonstrate clinically significant improvement in LE strength.  Baseline 09/08/2023= 16.42 sec   Time 12  Period Weeks   Status  NEW  Target Date 12/01/23         Plan -     Clinical Impression Statement  Patient presents with good motivation for today's session. She was guarded with standing therex - unable to remove UE  support with standing activities at bar. She was later challenged with lateral stepping with resistance using blaze pods focusing on coordination, balance, and cognitive function. The incorporation of dual-tasking technology with color recognition and association with specific movements in Blaze Pods was strategically chosen to provide a dynamic training environment, enabling the patient to engage in simultaneous physical and cognitive tasks. She did demonstrate in- session progress but experienced some light headed symptoms - subsided with rest.  Pt will benefit from continued PT services to address and improve her functional strength and mobility to assist in return to previous level of function.    Personal Factors and Comorbidities Age;Fitness;Behavior Pattern;Past/Current Experience    Examination-Activity Limitations Locomotion Level;Transfers;Lift;Stairs    Examination-Participation Restrictions Meal Prep;Cleaning;Community Activity;Driving;Interpersonal Relationship;Yard Work    Stability/Clinical Decision Making Stable/Uncomplicated    Clinical Decision Making Moderate    Rehab Potential Good    PT Frequency 1-2x / week    PT Duration 12 weeks    PT Treatment/Interventions ADLs/Self Care Home Management;Gait training;Stair training;Functional mobility training;Neuromuscular re-education;Balance training;Therapeutic exercise;Therapeutic activities;Passive range of motion;Manual techniques    PT Next Visit Plan Continue with balance and therex  for improved strength and balance         PT Home Exercise Plan asked at eval to check and log BP twice daily,   Access Code: [email or web address] URL: [email or web address] Date: 07/21/2023 Prepared by: Grier Rocher  Exercises - Sit to Stand with Armchair  - 1 x daily - 5  x weekly - 2 sets - 6 reps - Standing Hip Abduction with Counter Support  - 1 x daily - 7 x weekly - 3 sets - 10 reps - Seated Hip Abduction with Resistance  - 1 x daily -  7 x weekly - 3 sets - 10 reps - Seated March with Resistance  - 1 x daily - 7 x weekly - 3 sets - 10 reps   Consulted and Agree with Plan of Care Patient;Family member/caregiver             Patient will benefit from skilled therapeutic intervention in order to improve the following deficits and impairments:   balance impairments, endurance deficits, reduced strength,   Visit Diagnosis: Muscle weakness (generalized)  Difficulty in walking, not elsewhere classified  Unsteadiness on feet  Repeated falls     Problem List Patient Active Problem List   Diagnosis Date Noted   Obstructive nephropathy 01/13/2023   Acute pyelonephritis 01/13/2023   AKI (acute kidney injury) (HCC) 01/13/2023   Hypokalemia 01/13/2023   Diarrhea 01/13/2023   Thoracic aortic aneurysm without rupture (HCC) 11/09/2022   Hematuria 11/05/2022   Polyp of esophagus 11/02/2022   Iron deficiency anemia 07/28/2022   Positive colorectal cancer screening using Cologuard test    Abnormal ECG 03/31/2021   Acute non-recurrent sinusitis 11/30/2020   Acute otitis media 11/30/2020   Benign essential HTN 10/31/2018   Frequent PVCs 09/08/2017   A-fib (HCC) 08/27/2015   Clinical depression 08/27/2015   Blood pressure elevated 08/27/2015   Polypharmacy 08/27/2015   Esophagitis, reflux 08/27/2015   Fatigue 08/27/2015   Acid reflux 08/27/2015   HLD (hyperlipidemia) 08/27/2015   Adult hypothyroidism 08/27/2015   Adaptive colitis 08/27/2015   Malaise and fatigue 08/27/2015   Mild major depression (HCC) 08/27/2015   Polymyalgia rheumatica (HCC) 08/27/2015   Cranial arteritis (HCC) 08/27/2015   Avitaminosis D 08/27/2015   Louis Meckel, PT Physical Therapist - [place]  [facility]  3:06 PM 10/20/23

## 2023-10-25 ENCOUNTER — Ambulatory Visit: Payer: Medicare Other

## 2023-10-25 DIAGNOSIS — R262 Difficulty in walking, not elsewhere classified: Secondary | ICD-10-CM

## 2023-10-25 DIAGNOSIS — R2681 Unsteadiness on feet: Secondary | ICD-10-CM

## 2023-10-25 DIAGNOSIS — M6281 Muscle weakness (generalized): Secondary | ICD-10-CM | POA: Diagnosis not present

## 2023-10-25 DIAGNOSIS — R296 Repeated falls: Secondary | ICD-10-CM | POA: Diagnosis not present

## 2023-10-25 NOTE — Therapy (Signed)
[facility] Outpatient Rehabilitation at [facility] [address] [place], [place], [address] Phone: [phone]   Fax:  [phone]  Outpatient Physical Therapy Treatment   Patient Details  Name: Tara Marquez MRN: [id number] Date of Birth: Feb 03, 1941 Referring Provider (PT): Julieanne Manson MD (PCP)   Encounter Date: 10/25/2023   PT End of Session - 10/25/23 1027     Visit Number 24    Number of Visits 37    Date for PT Re-Evaluation 12/01/23    Authorization Type Medicare A&B primary with BCBS supplemental secondary    Progress Note Due on Visit 30    PT Start Time 1020    PT Stop Time 1059    PT Time Calculation (min) 39 min    Equipment Utilized During Treatment Gait belt    Activity Tolerance Patient tolerated treatment well    Behavior During Therapy WFL for tasks assessed/performed                    Past Medical History:  Diagnosis Date   Acute pyelonephritis    Adaptive colitis    Anemia    Aortic atherosclerosis (HCC)    Atrial fibrillation and flutter (HCC)    a.) CHA2DS2VASc = 5 (age x 2, sex, HTN, vascular disease history);  b.) rate/rhythm maintained on oral amiodarone + metoprolol succinate; chronically anticoagulated with dose reduced apixaban   CAD (coronary artery disease)    Depression    Descending thoracic aortic aneurysm (HCC)    a.) CT chest 09/29/2022: 4.9 cm; b.) CT abd 01/13/2023: 4.9 cm   Frequent PVCs    GERD (gastroesophageal reflux disease)    Hepatic steatosis    History of 2019 novel coronavirus disease (COVID-19) 12/04/2021   Hyperlipidemia    Hypertension    Hypokalemia    Hypothyroidism    Long term current use of amiodarone    Long term current use of anticoagulant    a.) dose reduced apixaban   Nephrolithiasis    Osteopenia    Polymyalgia rheumatica (HCC)    Skin cancer    Splenomegaly    Temporal arteritis (HCC)    Vitamin D deficiency     Past Surgical History:  Procedure  Laterality Date   ABDOMINAL HYSTERECTOMY  03/1971   Status Post Hysterectomy   CATARACT EXTRACTION Left    COLONOSCOPY     2010   COLONOSCOPY WITH PROPOFOL N/A 01/12/2022   Procedure: COLONOSCOPY WITH PROPOFOL;  Surgeon: Midge Minium, MD;  Location: [facility] ENDOSCOPY;  Service: Endoscopy;  Laterality: N/A;   CYSTOSCOPY WITH RETROGRADE PYELOGRAM, URETEROSCOPY AND STENT PLACEMENT Left 01/13/2023   Procedure: CYSTOSCOPY WITH RETROGRADE PYELOGRAM, URETEROSCOPY AND STENT PLACEMENT;  Surgeon: Riki Altes, MD;  Location: [facility] ORS;  Service: Urology;  Laterality: Left;   CYSTOSCOPY/URETEROSCOPY/HOLMIUM LASER/STENT PLACEMENT Left 02/08/2023   Procedure: CYSTOSCOPY/URETEROSCOPY/HOLMIUM LASER/STENT EXCHANGE;  Surgeon: Riki Altes, MD;  Location: [facility] ORS;  Service: Urology;  Laterality: Left;   ESOPHAGOGASTRODUODENOSCOPY N/A 11/02/2022   Procedure: ESOPHAGOGASTRODUODENOSCOPY (EGD);  Surgeon: Midge Minium, MD;  Location: [facility] ENDOSCOPY;  Service: Endoscopy;  Laterality: N/A;   EYE SURGERY     GIVENS CAPSULE STUDY N/A 12/22/2022   Procedure: GIVENS CAPSULE STUDY;  Surgeon: Midge Minium, MD;  Location: [facility] ENDOSCOPY;  Service: Endoscopy;  Laterality: N/A;   TONSILLECTOMY     TONSILLECTOMY AND ADENOIDECTOMY  1951   WISDOM TOOTH EXTRACTION  1970       Subjective Assessment     Subjective Pt  reported feeling okay today. States no particular soreness from last visit.    Pertinent History Ryleah Turkovich is an 82yoF who is referred by PCP due to gradual progressive weakness since hospital admission in February 2024. Pt presents to PT eval with husband. Pt reports being in hospital for ~ a week in Feb 2024 with urinary obstruction, AKI, felt week initially but declined HHPT; pt/husband say progressively more weak over subsequent months, more difficulty with walking, husband has now asked her to use a cane. Pt reports 5 falls, 4 of which were outside on natural surface 5th trying to clear a threshold  (posterior fall buttock, back, hit head). Does get dizzy, but not frequently, not related to falls often.    How long can you sit comfortably? Not a limitation    How long can you stand comfortably? Not a limitation    How long can you walk comfortably? Mostly walks household distances, but can tolerate occasional store trips with buggy support.    Currently in Pain? No/denies               Objective measurements completed on examination: See above findings.    TODAY'S TREATMENT: 10/25/23 Seated  marches with UE support 2.5# AW 2x 15 reps alt LE LAQ 2.5 # AW 2x15 each side with 2 sec hold on 2nd set  Lateral step over spike ball 2.5 # AW x 12 reps each side with UE support  Calf raises 2.5# AW 2 x 12 rep Toe raises 2.5 # AW 2 x 12 rep Hamstring curl GTB x 15 reps each Resistive gait 2.5 # x 200 feet usng SPC- focusing on increasing resistance.  Sit to stand  x 12 reps      .      PT Education -     Education Details Exercise technique- VC for safety    Safety in management of unlevel sidewalk to imrprove step height                      PT Short Term Goals -        PT SHORT TERM GOAL #1   Title compliant with balance HEP    Time 4    Period Weeks    Status Achieved    Target Date 08/12/23      PT SHORT TERM GOAL #2   Title Improved overground gait speed over 563ft to improve ability to access limited community distances safely.    Baseline eval: 339ft at 0.24m/s; 622ft s device 0.58m/s.   Time 4    Period Weeks    Status Achieved     Target Date 08/12/23               PT Long Term Goals -       PT LONG TERM GOAL #1   Title FOTO score improvement >12 points to indicate improve confidence in balance.    Baseline 07/19/2023=43; 08/22/23: 51; 09/08/2023 =will assess next visit; 10/22= 52   Time 12   Period Weeks    Status On going     Target Date 12/01/2023     PT LONG TERM GOAL #2   Title Improvement in standardized balance assessment by  MCID or greater.    Baseline 07/19/2023= BERG- 35/56; 08/22/23: 44   Time 8    Period Weeks    Status Achieved     Target Date 09/09/23      PT LONG TERM GOAL #3  Title Pt to demonstrate 5xSTS hands free from standard height surface to indicated improved BLE power.    Baseline 07/19/2023= 31.07 sec without UE Support; 08/22/23: 19.15sec (hands on thighs); 09/08/2023= 16.42 sec    Time 8    Period Weeks    Status MET   Target Date 09/09/23      PT LONG TERM GOAL #4   Title Pt to demonstrate performance of 2080ft AMB >0.59m/s without increased pain and without LOB at modI level or less to restore ability to access comunity distances for IADL.    Baseline 08/22/23: 668ft in 4:02 (0.75m/s); 08/25/2023= 1200 in . 9/12= 0.62 m/s   Time 8    Period Weeks    Status Ongoing    Target Date 12/01/23         PT LONG TERM GOAL #5  Title Pt will improve BERG by at least 3 points in order to demonstrate clinically significant improvement in balance.   Baseline 09/08/2023= 46/56  Time 12  Period Weeks   Status NEW  Target Date 12/01/23       PT LONG TERM GOAL #6  Title  Pt will decrease 5TSTS by at least 3 seconds in order to demonstrate clinically significant improvement in LE strength.  Baseline 09/08/2023= 16.42 sec   Time 12  Period Weeks   Status  NEW  Target Date 12/01/23         Plan -     Clinical Impression Statement  Treatment consisted of functional endurance strengthening. Patient performed mostly sitting exercises with some light resistance. She was able to complete 2 sets of multiple reps with fatigue as only limiting factor today.   Pt will benefit from continued PT services to address and improve her functional strength and mobility to assist in return to previous level of function.    Personal Factors and Comorbidities Age;Fitness;Behavior Pattern;Past/Current Experience    Examination-Activity Limitations Locomotion Level;Transfers;Lift;Stairs     Examination-Participation Restrictions Meal Prep;Cleaning;Community Activity;Driving;Interpersonal Relationship;Yard Work    Stability/Clinical Decision Making Stable/Uncomplicated    Clinical Decision Making Moderate    Rehab Potential Good    PT Frequency 1-2x / week    PT Duration 12 weeks    PT Treatment/Interventions ADLs/Self Care Home Management;Gait training;Stair training;Functional mobility training;Neuromuscular re-education;Balance training;Therapeutic exercise;Therapeutic activities;Passive range of motion;Manual techniques    PT Next Visit Plan Continue with balance and therex  for improved strength and balance         PT Home Exercise Plan asked at eval to check and log BP twice daily,   Access Code: [email or web address] URL: [email or web address] Date: 07/21/2023 Prepared by: Grier Rocher  Exercises - Sit to Stand with Armchair  - 1 x daily - 5 x weekly - 2 sets - 6 reps - Standing Hip Abduction with Counter Support  - 1 x daily - 7 x weekly - 3 sets - 10 reps - Seated Hip Abduction with Resistance  - 1 x daily - 7 x weekly - 3 sets - 10 reps - Seated March with Resistance  - 1 x daily - 7 x weekly - 3 sets - 10 reps   Consulted and Agree with Plan of Care Patient;Family member/caregiver             Patient will benefit from skilled therapeutic intervention in order to improve the following deficits and impairments:   balance impairments, endurance deficits, reduced strength,   Visit Diagnosis: Muscle weakness (generalized)  Difficulty in walking, not elsewhere classified  Unsteadiness  on feet  Repeated falls     Problem List Patient Active Problem List   Diagnosis Date Noted   Obstructive nephropathy 01/13/2023   Acute pyelonephritis 01/13/2023   AKI (acute kidney injury) (HCC) 01/13/2023   Hypokalemia 01/13/2023   Diarrhea 01/13/2023   Thoracic aortic aneurysm without rupture (HCC) 11/09/2022   Hematuria 11/05/2022   Polyp of esophagus  11/02/2022   Iron deficiency anemia 07/28/2022   Positive colorectal cancer screening using Cologuard test    Abnormal ECG 03/31/2021   Acute non-recurrent sinusitis 11/30/2020   Acute otitis media 11/30/2020   Benign essential HTN 10/31/2018   Frequent PVCs 09/08/2017   A-fib (HCC) 08/27/2015   Clinical depression 08/27/2015   Blood pressure elevated 08/27/2015   Polypharmacy 08/27/2015   Esophagitis, reflux 08/27/2015   Fatigue 08/27/2015   Acid reflux 08/27/2015   HLD (hyperlipidemia) 08/27/2015   Adult hypothyroidism 08/27/2015   Adaptive colitis 08/27/2015   Malaise and fatigue 08/27/2015   Mild major depression (HCC) 08/27/2015   Polymyalgia rheumatica (HCC) 08/27/2015   Cranial arteritis (HCC) 08/27/2015   Avitaminosis D 08/27/2015   Louis Meckel, PT Physical Therapist - [place]  [facility]  12:13 PM 10/25/23

## 2023-10-27 ENCOUNTER — Ambulatory Visit: Payer: Medicare Other | Admitting: Physical Therapy

## 2023-10-27 DIAGNOSIS — R296 Repeated falls: Secondary | ICD-10-CM

## 2023-10-27 DIAGNOSIS — R262 Difficulty in walking, not elsewhere classified: Secondary | ICD-10-CM | POA: Diagnosis not present

## 2023-10-27 DIAGNOSIS — R2681 Unsteadiness on feet: Secondary | ICD-10-CM

## 2023-10-27 DIAGNOSIS — M6281 Muscle weakness (generalized): Secondary | ICD-10-CM | POA: Diagnosis not present

## 2023-10-27 NOTE — Therapy (Signed)
[facility] Outpatient Rehabilitation at [facility] [address] [place], [place], [address] Phone: [phone]   Fax:  [phone]  Outpatient Physical Therapy Treatment   Patient Details  Name: Tara Marquez MRN: [id number] Date of Birth: January 31, 1941 Referring Provider (PT): Julieanne Manson MD (PCP)   Encounter Date: 10/27/2023   PT End of Session - 10/27/23 0933     Visit Number 25    Number of Visits 37    Date for PT Re-Evaluation 12/01/23    Authorization Type Medicare A&B primary with BCBS supplemental secondary    Progress Note Due on Visit 30    PT Start Time 0934    PT Stop Time 1013    PT Time Calculation (min) 39 min    Equipment Utilized During Treatment Gait belt    Activity Tolerance Patient tolerated treatment well    Behavior During Therapy WFL for tasks assessed/performed                    Past Medical History:  Diagnosis Date   Acute pyelonephritis    Adaptive colitis    Anemia    Aortic atherosclerosis (HCC)    Atrial fibrillation and flutter (HCC)    a.) CHA2DS2VASc = 5 (age x 2, sex, HTN, vascular disease history);  b.) rate/rhythm maintained on oral amiodarone + metoprolol succinate; chronically anticoagulated with dose reduced apixaban   CAD (coronary artery disease)    Depression    Descending thoracic aortic aneurysm (HCC)    a.) CT chest 09/29/2022: 4.9 cm; b.) CT abd 01/13/2023: 4.9 cm   Frequent PVCs    GERD (gastroesophageal reflux disease)    Hepatic steatosis    History of 2019 novel coronavirus disease (COVID-19) 12/04/2021   Hyperlipidemia    Hypertension    Hypokalemia    Hypothyroidism    Long term current use of amiodarone    Long term current use of anticoagulant    a.) dose reduced apixaban   Nephrolithiasis    Osteopenia    Polymyalgia rheumatica (HCC)    Skin cancer    Splenomegaly    Temporal arteritis (HCC)    Vitamin D deficiency     Past Surgical History:  Procedure  Laterality Date   ABDOMINAL HYSTERECTOMY  03/1971   Status Post Hysterectomy   CATARACT EXTRACTION Left    COLONOSCOPY     2010   COLONOSCOPY WITH PROPOFOL N/A 01/12/2022   Procedure: COLONOSCOPY WITH PROPOFOL;  Surgeon: Midge Minium, MD;  Location: [facility] ENDOSCOPY;  Service: Endoscopy;  Laterality: N/A;   CYSTOSCOPY WITH RETROGRADE PYELOGRAM, URETEROSCOPY AND STENT PLACEMENT Left 01/13/2023   Procedure: CYSTOSCOPY WITH RETROGRADE PYELOGRAM, URETEROSCOPY AND STENT PLACEMENT;  Surgeon: Riki Altes, MD;  Location: [facility] ORS;  Service: Urology;  Laterality: Left;   CYSTOSCOPY/URETEROSCOPY/HOLMIUM LASER/STENT PLACEMENT Left 02/08/2023   Procedure: CYSTOSCOPY/URETEROSCOPY/HOLMIUM LASER/STENT EXCHANGE;  Surgeon: Riki Altes, MD;  Location: [facility] ORS;  Service: Urology;  Laterality: Left;   ESOPHAGOGASTRODUODENOSCOPY N/A 11/02/2022   Procedure: ESOPHAGOGASTRODUODENOSCOPY (EGD);  Surgeon: Midge Minium, MD;  Location: [facility] ENDOSCOPY;  Service: Endoscopy;  Laterality: N/A;   EYE SURGERY     GIVENS CAPSULE STUDY N/A 12/22/2022   Procedure: GIVENS CAPSULE STUDY;  Surgeon: Midge Minium, MD;  Location: [facility] ENDOSCOPY;  Service: Endoscopy;  Laterality: N/A;   TONSILLECTOMY     TONSILLECTOMY AND ADENOIDECTOMY  1951   WISDOM TOOTH EXTRACTION  1970       Subjective Assessment     Subjective Pt  reported feeling okay today. Reports no falls since last PT treatment, reports occasional stumble but able to self correct without complete LOB.    Pertinent History Tara Marquez is an 82yoF who is referred by PCP due to gradual progressive weakness since hospital admission in February 2024. Pt presents to PT eval with husband. Pt reports being in hospital for ~ a week in Feb 2024 with urinary obstruction, AKI, felt week initially but declined HHPT; pt/husband say progressively more weak over subsequent months, more difficulty with walking, husband has now asked her to use a cane. Pt reports 5 falls, 4 of which were  outside on natural surface 5th trying to clear a threshold (posterior fall buttock, back, hit head). Does get dizzy, but not frequently, not related to falls often.    How long can you sit comfortably? Not a limitation    How long can you stand comfortably? Not a limitation    How long can you walk comfortably? Mostly walks household distances, but can tolerate occasional store trips with buggy support.    Currently in Pain? No/denies               Objective measurements completed on examination: See above findings.    TODAY'S TREATMENT: 10/27/23 Nustep level 3, 3 min x 2 with therapeutic rest break between bouts.    Standing :  Calf raises 3# AW 2 x 12 rep Toe raises 3# AW 2 x 12 rep Hamstring curl 3# x 12 reps each Hip abduction 2 x 12  3 #AW Hip extension 2 x 10 3# AW Resistive gait 3 # 2 x 160 feet. no UE support 2 mild staggered step  Sit to stand 2 x 12 reps  Therapeutic breaks intermittently throughout session due to BLE fatigue. Pt reports moderate soreness in BLE at end of PT treatment.      .      PT Education -     Education Details Exercise technique- VC for safety    Safety in management of unlevel sidewalk to imrprove step height                      PT Short Term Goals -        PT SHORT TERM GOAL #1   Title compliant with balance HEP    Time 4    Period Weeks    Status Achieved    Target Date 08/12/23      PT SHORT TERM GOAL #2   Title Improved overground gait speed over 565ft to improve ability to access limited community distances safely.    Baseline eval: 348ft at 0.1m/s; 666ft s device 0.77m/s.   Time 4    Period Weeks    Status Achieved     Target Date 08/12/23               PT Long Term Goals -       PT LONG TERM GOAL #1   Title FOTO score improvement >12 points to indicate improve confidence in balance.    Baseline 07/19/2023=43; 08/22/23: 51; 09/08/2023 =will assess next visit; 10/22= 52   Time 12   Period Weeks     Status On going     Target Date 12/01/2023     PT LONG TERM GOAL #2   Title Improvement in standardized balance assessment by MCID or greater.    Baseline 07/19/2023= BERG- 35/56; 08/22/23: 44   Time 8    Period Weeks  Status Achieved     Target Date 09/09/23      PT LONG TERM GOAL #3   Title Pt to demonstrate 5xSTS hands free from standard height surface to indicated improved BLE power.    Baseline 07/19/2023= 31.07 sec without UE Support; 08/22/23: 19.15sec (hands on thighs); 09/08/2023= 16.42 sec    Time 8    Period Weeks    Status MET   Target Date 09/09/23      PT LONG TERM GOAL #4   Title Pt to demonstrate performance of 2031ft AMB >0.30m/s without increased pain and without LOB at modI level or less to restore ability to access comunity distances for IADL.    Baseline 08/22/23: 652ft in 4:02 (0.12m/s); 08/25/2023= 1200 in . 9/12= 0.62 m/s   Time 8    Period Weeks    Status Ongoing    Target Date 12/01/23         PT LONG TERM GOAL #5  Title Pt will improve BERG by at least 3 points in order to demonstrate clinically significant improvement in balance.   Baseline 09/08/2023= 46/56  Time 12  Period Weeks   Status NEW  Target Date 12/01/23       PT LONG TERM GOAL #6  Title  Pt will decrease 5TSTS by at least 3 seconds in order to demonstrate clinically significant improvement in LE strength.  Baseline 09/08/2023= 16.42 sec   Time 12  Period Weeks   Status  NEW  Target Date 12/01/23         Plan -     Clinical Impression Statement  Treatment consisted of functional BLE strengthening. Patient performed mostly standing exercises with some moderate resistance. She was able to complete 2 sets of multiple reps with fatigue as only limiting factor today.   Pt will benefit from continued PT services to address and improve her functional strength and mobility to assist in return to previous level of function.    Personal Factors and Comorbidities Age;Fitness;Behavior  Pattern;Past/Current Experience    Examination-Activity Limitations Locomotion Level;Transfers;Lift;Stairs    Examination-Participation Restrictions Meal Prep;Cleaning;Community Activity;Driving;Interpersonal Relationship;Yard Work    Stability/Clinical Decision Making Stable/Uncomplicated    Clinical Decision Making Moderate    Rehab Potential Good    PT Frequency 1-2x / week    PT Duration 12 weeks    PT Treatment/Interventions ADLs/Self Care Home Management;Gait training;Stair training;Functional mobility training;Neuromuscular re-education;Balance training;Therapeutic exercise;Therapeutic activities;Passive range of motion;Manual techniques    PT Next Visit Plan Continue with balance and therex  for improved strength and balance         PT Home Exercise Plan asked at eval to check and log BP twice daily,   Access Code: [email or web address] URL: [email or web address] Date: 07/21/2023 Prepared by: Grier Rocher  Exercises - Sit to Stand with Armchair  - 1 x daily - 5 x weekly - 2 sets - 6 reps - Standing Hip Abduction with Counter Support  - 1 x daily - 7 x weekly - 3 sets - 10 reps - Seated Hip Abduction with Resistance  - 1 x daily - 7 x weekly - 3 sets - 10 reps - Seated March with Resistance  - 1 x daily - 7 x weekly - 3 sets - 10 reps   Consulted and Agree with Plan of Care Patient;Family member/caregiver             Patient will benefit from skilled therapeutic intervention in order to improve the following deficits and impairments:   balance  impairments, endurance deficits, reduced strength,   Visit Diagnosis: Muscle weakness (generalized)  Difficulty in walking, not elsewhere classified  Unsteadiness on feet  Repeated falls     Problem List Patient Active Problem List   Diagnosis Date Noted   Obstructive nephropathy 01/13/2023   Acute pyelonephritis 01/13/2023   AKI (acute kidney injury) (HCC) 01/13/2023   Hypokalemia 01/13/2023   Diarrhea 01/13/2023    Thoracic aortic aneurysm without rupture (HCC) 11/09/2022   Hematuria 11/05/2022   Polyp of esophagus 11/02/2022   Iron deficiency anemia 07/28/2022   Positive colorectal cancer screening using Cologuard test    Abnormal ECG 03/31/2021   Acute non-recurrent sinusitis 11/30/2020   Acute otitis media 11/30/2020   Benign essential HTN 10/31/2018   Frequent PVCs 09/08/2017   A-fib (HCC) 08/27/2015   Clinical depression 08/27/2015   Blood pressure elevated 08/27/2015   Polypharmacy 08/27/2015   Esophagitis, reflux 08/27/2015   Fatigue 08/27/2015   Acid reflux 08/27/2015   HLD (hyperlipidemia) 08/27/2015   Adult hypothyroidism 08/27/2015   Adaptive colitis 08/27/2015   Malaise and fatigue 08/27/2015   Mild major depression (HCC) 08/27/2015   Polymyalgia rheumatica (HCC) 08/27/2015   Cranial arteritis (HCC) 08/27/2015   Avitaminosis D 08/27/2015   Grier Rocher PT, DPT  Physical Therapist - [place]  [facility]  9:38 AM 10/27/23

## 2023-11-01 ENCOUNTER — Ambulatory Visit: Payer: Medicare Other | Attending: Family Medicine

## 2023-11-01 DIAGNOSIS — R2681 Unsteadiness on feet: Secondary | ICD-10-CM | POA: Diagnosis not present

## 2023-11-01 DIAGNOSIS — R262 Difficulty in walking, not elsewhere classified: Secondary | ICD-10-CM | POA: Diagnosis not present

## 2023-11-01 DIAGNOSIS — M6281 Muscle weakness (generalized): Secondary | ICD-10-CM | POA: Diagnosis not present

## 2023-11-01 DIAGNOSIS — R296 Repeated falls: Secondary | ICD-10-CM | POA: Insufficient documentation

## 2023-11-01 NOTE — Therapy (Signed)
[facility] Outpatient Rehabilitation at [facility] [address] [place], [place], [address] Phone: [phone]   Fax:  [phone]  Outpatient Physical Therapy Treatment   Patient Details  Name: Tara Marquez MRN: [id number] Date of Birth: January 04, 1941 Referring Provider (PT): Julieanne Manson MD (PCP)   Encounter Date: 11/01/2023   PT End of Session - 11/01/23 1318     Visit Number 26    Number of Visits 37    Date for PT Re-Evaluation 12/01/23    Authorization Type Medicare A&B primary with BCBS supplemental secondary    Progress Note Due on Visit 30    PT Start Time 1315    PT Stop Time 1400    PT Time Calculation (min) 45 min    Equipment Utilized During Treatment Gait belt    Activity Tolerance Patient tolerated treatment well    Behavior During Therapy WFL for tasks assessed/performed                    Past Medical History:  Diagnosis Date   Acute pyelonephritis    Adaptive colitis    Anemia    Aortic atherosclerosis (HCC)    Atrial fibrillation and flutter (HCC)    a.) CHA2DS2VASc = 5 (age x 2, sex, HTN, vascular disease history);  b.) rate/rhythm maintained on oral amiodarone + metoprolol succinate; chronically anticoagulated with dose reduced apixaban   CAD (coronary artery disease)    Depression    Descending thoracic aortic aneurysm (HCC)    a.) CT chest 09/29/2022: 4.9 cm; b.) CT abd 01/13/2023: 4.9 cm   Frequent PVCs    GERD (gastroesophageal reflux disease)    Hepatic steatosis    History of 2019 novel coronavirus disease (COVID-19) 12/04/2021   Hyperlipidemia    Hypertension    Hypokalemia    Hypothyroidism    Long term current use of amiodarone    Long term current use of anticoagulant    a.) dose reduced apixaban   Nephrolithiasis    Osteopenia    Polymyalgia rheumatica (HCC)    Skin cancer    Splenomegaly    Temporal arteritis (HCC)    Vitamin D deficiency     Past Surgical History:  Procedure Laterality  Date   ABDOMINAL HYSTERECTOMY  03/1971   Status Post Hysterectomy   CATARACT EXTRACTION Left    COLONOSCOPY     2010   COLONOSCOPY WITH PROPOFOL N/A 01/12/2022   Procedure: COLONOSCOPY WITH PROPOFOL;  Surgeon: Midge Minium, MD;  Location: [facility] ENDOSCOPY;  Service: Endoscopy;  Laterality: N/A;   CYSTOSCOPY WITH RETROGRADE PYELOGRAM, URETEROSCOPY AND STENT PLACEMENT Left 01/13/2023   Procedure: CYSTOSCOPY WITH RETROGRADE PYELOGRAM, URETEROSCOPY AND STENT PLACEMENT;  Surgeon: Riki Altes, MD;  Location: [facility] ORS;  Service: Urology;  Laterality: Left;   CYSTOSCOPY/URETEROSCOPY/HOLMIUM LASER/STENT PLACEMENT Left 02/08/2023   Procedure: CYSTOSCOPY/URETEROSCOPY/HOLMIUM LASER/STENT EXCHANGE;  Surgeon: Riki Altes, MD;  Location: [facility] ORS;  Service: Urology;  Laterality: Left;   ESOPHAGOGASTRODUODENOSCOPY N/A 11/02/2022   Procedure: ESOPHAGOGASTRODUODENOSCOPY (EGD);  Surgeon: Midge Minium, MD;  Location: [facility] ENDOSCOPY;  Service: Endoscopy;  Laterality: N/A;   EYE SURGERY     GIVENS CAPSULE STUDY N/A 12/22/2022   Procedure: GIVENS CAPSULE STUDY;  Surgeon: Midge Minium, MD;  Location: [facility] ENDOSCOPY;  Service: Endoscopy;  Laterality: N/A;   TONSILLECTOMY     TONSILLECTOMY AND ADENOIDECTOMY  1951   WISDOM TOOTH EXTRACTION  1970       Subjective Assessment     Subjective Pt  reports no significant changes since last visit.    Pertinent History Tara Marquez is an 82yoF who is referred by PCP due to gradual progressive weakness since hospital admission in February 2024. Pt presents to PT eval with husband. Pt reports being in hospital for ~ a week in Feb 2024 with urinary obstruction, AKI, felt week initially but declined HHPT; pt/husband say progressively more weak over subsequent months, more difficulty with walking, husband has now asked her to use a cane. Pt reports 5 falls, 4 of which were outside on natural surface 5th trying to clear a threshold (posterior fall buttock, back, hit head). Does  get dizzy, but not frequently, not related to falls often.    How long can you sit comfortably? Not a limitation    How long can you stand comfortably? Not a limitation    How long can you walk comfortably? Mostly walks household distances, but can tolerate occasional store trips with buggy support.    Currently in Pain? No/denies               Objective measurements completed on examination: See above findings.    TODAY'S TREATMENT: 11/01/23  NMR:  Step tap onto 2nd step with minimal UE support Lateral side step up/over orange hurdle x 15 reps- trying to no use UE support- Intermittent reaching   THEREX;  Step up onto 1st step for power- x 12 reps each LE.   Sit to stand x 5 (as fast and safe as possible) and 5 more focusing on slow eccentric control  Standing :  Calf raises 3# AW 2 x 12 rep Toe raises 3# AW 2 x 12 rep Hamstring curl 3# 2 x 12 reps each (mild cramping on left LE on 1st set- none on 2nd)  Hip abduction 2 x 12  3 #AW Hip extension 2 x 12 3# AW Resistive gait- Backward and side stepping- 7.5# matrix cable system - x 5 each way  Therapeutic breaks intermittently throughout session due to BLE fatigue. Pt reports moderate soreness in BLE at end of PT treatment.      .      PT Education -     Education Details Exercise technique- VC for safety    Safety in management of unlevel sidewalk to imrprove step height                      PT Short Term Goals -        PT SHORT TERM GOAL #1   Title compliant with balance HEP    Time 4    Period Weeks    Status Achieved    Target Date 08/12/23      PT SHORT TERM GOAL #2   Title Improved overground gait speed over 581ft to improve ability to access limited community distances safely.    Baseline eval: 357ft at 0.58m/s; 641ft s device 0.34m/s.   Time 4    Period Weeks    Status Achieved     Target Date 08/12/23               PT Long Term Goals -       PT LONG TERM GOAL #1   Title FOTO  score improvement >12 points to indicate improve confidence in balance.    Baseline 07/19/2023=43; 08/22/23: 51; 09/08/2023 =will assess next visit; 10/22= 52   Time 12   Period Weeks    Status On going     Target  Date 12/01/2023     PT LONG TERM GOAL #2   Title Improvement in standardized balance assessment by MCID or greater.    Baseline 07/19/2023= BERG- 35/56; 08/22/23: 44   Time 8    Period Weeks    Status Achieved     Target Date 09/09/23      PT LONG TERM GOAL #3   Title Pt to demonstrate 5xSTS hands free from standard height surface to indicated improved BLE power.    Baseline 07/19/2023= 31.07 sec without UE Support; 08/22/23: 19.15sec (hands on thighs); 09/08/2023= 16.42 sec    Time 8    Period Weeks    Status MET   Target Date 09/09/23      PT LONG TERM GOAL #4   Title Pt to demonstrate performance of 2058ft AMB >0.72m/s without increased pain and without LOB at modI level or less to restore ability to access comunity distances for IADL.    Baseline 08/22/23: 645ft in 4:02 (0.61m/s); 08/25/2023= 1200 in . 9/12= 0.62 m/s   Time 8    Period Weeks    Status Ongoing    Target Date 12/01/23         PT LONG TERM GOAL #5  Title Pt will improve BERG by at least 3 points in order to demonstrate clinically significant improvement in balance.   Baseline 09/08/2023= 46/56  Time 12  Period Weeks   Status NEW  Target Date 12/01/23       PT LONG TERM GOAL #6  Title  Pt will decrease 5TSTS by at least 3 seconds in order to demonstrate clinically significant improvement in LE strength.  Baseline 09/08/2023= 16.42 sec   Time 12  Period Weeks   Status  NEW  Target Date 12/01/23         Plan -     Clinical Impression Statement Treatment continued with progressive strengthening and dynamic balance training. She was able to progress some today with improved sit to stand with good eccentric control and eccentric control with resistive gait using Matrix cable system. Overall less  observed fatigue with less rest breaks than past few visits.  Pt will benefit from continued PT services to address and improve her functional strength and mobility to assist in return to previous level of function.    Personal Factors and Comorbidities Age;Fitness;Behavior Pattern;Past/Current Experience    Examination-Activity Limitations Locomotion Level;Transfers;Lift;Stairs    Examination-Participation Restrictions Meal Prep;Cleaning;Community Activity;Driving;Interpersonal Relationship;Yard Work    Stability/Clinical Decision Making Stable/Uncomplicated    Clinical Decision Making Moderate    Rehab Potential Good    PT Frequency 1-2x / week    PT Duration 12 weeks    PT Treatment/Interventions ADLs/Self Care Home Management;Gait training;Stair training;Functional mobility training;Neuromuscular re-education;Balance training;Therapeutic exercise;Therapeutic activities;Passive range of motion;Manual techniques    PT Next Visit Plan Continue with balance and therex  for improved strength and balance         PT Home Exercise Plan asked at eval to check and log BP twice daily,   Access Code: [email or web address] URL: [email or web address] Date: 07/21/2023 Prepared by: Grier Rocher  Exercises - Sit to Stand with Armchair  - 1 x daily - 5 x weekly - 2 sets - 6 reps - Standing Hip Abduction with Counter Support  - 1 x daily - 7 x weekly - 3 sets - 10 reps - Seated Hip Abduction with Resistance  - 1 x daily - 7 x weekly - 3 sets - 10 reps - Seated March with Resistance  -  1 x daily - 7 x weekly - 3 sets - 10 reps   Consulted and Agree with Plan of Care Patient;Family member/caregiver             Patient will benefit from skilled therapeutic intervention in order to improve the following deficits and impairments:   balance impairments, endurance deficits, reduced strength,   Visit Diagnosis: Difficulty in walking, not elsewhere classified  Muscle weakness  (generalized)  Unsteadiness on feet  Repeated falls     Problem List Patient Active Problem List   Diagnosis Date Noted   Obstructive nephropathy 01/13/2023   Acute pyelonephritis 01/13/2023   AKI (acute kidney injury) (HCC) 01/13/2023   Hypokalemia 01/13/2023   Diarrhea 01/13/2023   Thoracic aortic aneurysm without rupture (HCC) 11/09/2022   Hematuria 11/05/2022   Polyp of esophagus 11/02/2022   Iron deficiency anemia 07/28/2022   Positive colorectal cancer screening using Cologuard test    Abnormal ECG 03/31/2021   Acute non-recurrent sinusitis 11/30/2020   Acute otitis media 11/30/2020   Benign essential HTN 10/31/2018   Frequent PVCs 09/08/2017   A-fib (HCC) 08/27/2015   Clinical depression 08/27/2015   Blood pressure elevated 08/27/2015   Polypharmacy 08/27/2015   Esophagitis, reflux 08/27/2015   Fatigue 08/27/2015   Acid reflux 08/27/2015   HLD (hyperlipidemia) 08/27/2015   Adult hypothyroidism 08/27/2015   Adaptive colitis 08/27/2015   Malaise and fatigue 08/27/2015   Mild major depression (HCC) 08/27/2015   Polymyalgia rheumatica (HCC) 08/27/2015   Cranial arteritis (HCC) 08/27/2015   Avitaminosis D 08/27/2015   Louis Meckel, PT Physical Therapist - [place]  [facility]  2:12 PM 11/01/23

## 2023-11-03 ENCOUNTER — Ambulatory Visit: Payer: Medicare Other

## 2023-11-03 DIAGNOSIS — R262 Difficulty in walking, not elsewhere classified: Secondary | ICD-10-CM | POA: Diagnosis not present

## 2023-11-03 DIAGNOSIS — M6281 Muscle weakness (generalized): Secondary | ICD-10-CM

## 2023-11-03 DIAGNOSIS — R2681 Unsteadiness on feet: Secondary | ICD-10-CM | POA: Diagnosis not present

## 2023-11-03 DIAGNOSIS — R296 Repeated falls: Secondary | ICD-10-CM

## 2023-11-03 NOTE — Therapy (Signed)
[facility] Outpatient Rehabilitation at [facility] [address] [place], [place], [address] Phone: [phone]   Fax:  [phone]  Outpatient Physical Therapy Treatment   Patient Details  Name: Tara Marquez MRN: [id number] Date of Birth: August 11, 1941 Referring Provider (PT): Julieanne Manson MD (PCP)   Encounter Date: 11/03/2023   PT End of Session - 11/03/23 1306     Visit Number 27    Number of Visits 37    Date for PT Re-Evaluation 12/01/23    Authorization Type Medicare A&B primary with BCBS supplemental secondary    Authorization Time Period 07/14/23-09/08/23    Progress Note Due on Visit 30    PT Start Time 1315    PT Stop Time 1350    PT Time Calculation (min) 35 min    Equipment Utilized During Treatment Gait belt    Activity Tolerance Patient tolerated treatment well    Behavior During Therapy WFL for tasks assessed/performed                    Past Medical History:  Diagnosis Date   Acute pyelonephritis    Adaptive colitis    Anemia    Aortic atherosclerosis (HCC)    Atrial fibrillation and flutter (HCC)    a.) CHA2DS2VASc = 5 (age x 2, sex, HTN, vascular disease history);  b.) rate/rhythm maintained on oral amiodarone + metoprolol succinate; chronically anticoagulated with dose reduced apixaban   CAD (coronary artery disease)    Depression    Descending thoracic aortic aneurysm (HCC)    a.) CT chest 09/29/2022: 4.9 cm; b.) CT abd 01/13/2023: 4.9 cm   Frequent PVCs    GERD (gastroesophageal reflux disease)    Hepatic steatosis    History of 2019 novel coronavirus disease (COVID-19) 12/04/2021   Hyperlipidemia    Hypertension    Hypokalemia    Hypothyroidism    Long term current use of amiodarone    Long term current use of anticoagulant    a.) dose reduced apixaban   Nephrolithiasis    Osteopenia    Polymyalgia rheumatica (HCC)    Skin cancer    Splenomegaly    Temporal arteritis (HCC)    Vitamin D deficiency      Past Surgical History:  Procedure Laterality Date   ABDOMINAL HYSTERECTOMY  03/1971   Status Post Hysterectomy   CATARACT EXTRACTION Left    COLONOSCOPY     2010   COLONOSCOPY WITH PROPOFOL N/A 01/12/2022   Procedure: COLONOSCOPY WITH PROPOFOL;  Surgeon: Midge Minium, MD;  Location: [facility] ENDOSCOPY;  Service: Endoscopy;  Laterality: N/A;   CYSTOSCOPY WITH RETROGRADE PYELOGRAM, URETEROSCOPY AND STENT PLACEMENT Left 01/13/2023   Procedure: CYSTOSCOPY WITH RETROGRADE PYELOGRAM, URETEROSCOPY AND STENT PLACEMENT;  Surgeon: Riki Altes, MD;  Location: [facility] ORS;  Service: Urology;  Laterality: Left;   CYSTOSCOPY/URETEROSCOPY/HOLMIUM LASER/STENT PLACEMENT Left 02/08/2023   Procedure: CYSTOSCOPY/URETEROSCOPY/HOLMIUM LASER/STENT EXCHANGE;  Surgeon: Riki Altes, MD;  Location: [facility] ORS;  Service: Urology;  Laterality: Left;   ESOPHAGOGASTRODUODENOSCOPY N/A 11/02/2022   Procedure: ESOPHAGOGASTRODUODENOSCOPY (EGD);  Surgeon: Midge Minium, MD;  Location: [facility] ENDOSCOPY;  Service: Endoscopy;  Laterality: N/A;   EYE SURGERY     GIVENS CAPSULE STUDY N/A 12/22/2022   Procedure: GIVENS CAPSULE STUDY;  Surgeon: Midge Minium, MD;  Location: [facility] ENDOSCOPY;  Service: Endoscopy;  Laterality: N/A;   TONSILLECTOMY     TONSILLECTOMY AND ADENOIDECTOMY  1951   WISDOM TOOTH EXTRACTION  1970       Subjective  Assessment     Subjective Pt reports no significant changes since last visit. Pt reported no falls since last visit and 0/10 on NPS.    Pertinent History Tara Marquez is an 82yoF who is referred by PCP due to gradual progressive weakness since hospital admission in February 2024. Pt presents to PT eval with husband. Pt reports being in hospital for ~ a week in Feb 2024 with urinary obstruction, AKI, felt week initially but declined HHPT; pt/husband say progressively more weak over subsequent months, more difficulty with walking, husband has now asked her to use a cane. Pt reports 5 falls, 4 of which were  outside on natural surface 5th trying to clear a threshold (posterior fall buttock, back, hit head). Does get dizzy, but not frequently, not related to falls often.    How long can you sit comfortably? Not a limitation    How long can you stand comfortably? Not a limitation    How long can you walk comfortably? Mostly walks household distances, but can tolerate occasional store trips with buggy support.    Currently in Pain? No/denies               Objective measurements completed on examination: See above findings.    TODAY'S TREATMENT: 11/03/23  NMR:  Step tap onto 2nd step with minimal UE support x 12 reps  Lateral side step up/over orange hurdle x 15 reps- trying to no use UE support- Intermittent reaching   THEREX;  Step up onto 1st step for power- x 12 reps each LE.  Sit to stand x 5 (as fast and safe as possible) and 5 more focusing on slow eccentric control  Standing :  Calf raises 3# AW 2 x 12 rep Toe raises 3# AW 2 x 12 rep Hip abduction 2 x 12  3 #AW each LE Hip extension 2 x 12 3# AW each Le  Therapeutic breaks intermittently throughout session due to BLE fatigue.      Marland Kitchen      PT Education -     Education Details Exercise technique- VC for safety    Safety in management of unlevel sidewalk to imrprove step height                      PT Short Term Goals -        PT SHORT TERM GOAL #1   Title compliant with balance HEP    Time 4    Period Weeks    Status Achieved    Target Date 08/12/23      PT SHORT TERM GOAL #2   Title Improved overground gait speed over 526ft to improve ability to access limited community distances safely.    Baseline eval: 331ft at 0.35m/s; 673ft s device 0.38m/s.   Time 4    Period Weeks    Status Achieved     Target Date 08/12/23               PT Long Term Goals -       PT LONG TERM GOAL #1   Title FOTO score improvement >12 points to indicate improve confidence in balance.    Baseline 07/19/2023=43;  08/22/23: 51; 09/08/2023 =will assess next visit; 10/22= 52   Time 12   Period Weeks    Status On going     Target Date 12/01/2023     PT LONG TERM GOAL #2   Title Improvement in standardized balance assessment by MCID  or greater.    Baseline 07/19/2023= BERG- 35/56; 08/22/23: 44   Time 8    Period Weeks    Status Achieved     Target Date 09/09/23      PT LONG TERM GOAL #3   Title Pt to demonstrate 5xSTS hands free from standard height surface to indicated improved BLE power.    Baseline 07/19/2023= 31.07 sec without UE Support; 08/22/23: 19.15sec (hands on thighs); 09/08/2023= 16.42 sec    Time 8    Period Weeks    Status MET   Target Date 09/09/23      PT LONG TERM GOAL #4   Title Pt to demonstrate performance of 2063ft AMB >0.64m/s without increased pain and without LOB at modI level or less to restore ability to access comunity distances for IADL.    Baseline 08/22/23: 658ft in 4:02 (0.59m/s); 08/25/2023= 1200 in . 9/12= 0.62 m/s   Time 8    Period Weeks    Status Ongoing    Target Date 12/01/23         PT LONG TERM GOAL #5  Title Pt will improve BERG by at least 3 points in order to demonstrate clinically significant improvement in balance.   Baseline 09/08/2023= 46/56  Time 12  Period Weeks   Status NEW  Target Date 12/01/23       PT LONG TERM GOAL #6  Title  Pt will decrease 5TSTS by at least 3 seconds in order to demonstrate clinically significant improvement in LE strength.  Baseline 09/08/2023= 16.42 sec   Time 12  Period Weeks   Status  NEW  Target Date 12/01/23         Plan -     Clinical Impression Statement Pt responded well to treatment today. Pt tolerated all tasks during today's visit. Pt reported no difficulty with stairs going into and out of her house. Pt requested to end visit early, due to having to go to her spouse's medical appointment. This is the reason today's session was limited. Pt will benefit from continued PT services to address and  improve her functional strength and mobility to assist in return to previous level of function.    Personal Factors and Comorbidities Age;Fitness;Behavior Pattern;Past/Current Experience    Examination-Activity Limitations Locomotion Level;Transfers;Lift;Stairs    Examination-Participation Restrictions Meal Prep;Cleaning;Community Activity;Driving;Interpersonal Relationship;Yard Work    Stability/Clinical Decision Making Stable/Uncomplicated    Clinical Decision Making Moderate    Rehab Potential Good    PT Frequency 1-2x / week    PT Duration 12 weeks    PT Treatment/Interventions ADLs/Self Care Home Management;Gait training;Stair training;Functional mobility training;Neuromuscular re-education;Balance training;Therapeutic exercise;Therapeutic activities;Passive range of motion;Manual techniques    PT Next Visit Plan Continue with balance and therex  for improved strength and balance         PT Home Exercise Plan asked at eval to check and log BP twice daily,   Access Code: [email or web address] URL: [email or web address] Date: 07/21/2023 Prepared by: Grier Rocher  Exercises - Sit to Stand with Armchair  - 1 x daily - 5 x weekly - 2 sets - 6 reps - Standing Hip Abduction with Counter Support  - 1 x daily - 7 x weekly - 3 sets - 10 reps - Seated Hip Abduction with Resistance  - 1 x daily - 7 x weekly - 3 sets - 10 reps - Seated March with Resistance  - 1 x daily - 7 x weekly - 3 sets - 10 reps   Consulted and  Agree with Plan of Care Patient;Family member/caregiver             Patient will benefit from skilled therapeutic intervention in order to improve the following deficits and impairments:   balance impairments, endurance deficits, reduced strength,   Visit Diagnosis: Repeated falls  Difficulty in walking, not elsewhere classified  Muscle weakness (generalized)  Unsteadiness on feet     Problem List Patient Active Problem List   Diagnosis Date Noted    Obstructive nephropathy 01/13/2023   Acute pyelonephritis 01/13/2023   AKI (acute kidney injury) (HCC) 01/13/2023   Hypokalemia 01/13/2023   Diarrhea 01/13/2023   Thoracic aortic aneurysm without rupture (HCC) 11/09/2022   Hematuria 11/05/2022   Polyp of esophagus 11/02/2022   Iron deficiency anemia 07/28/2022   Positive colorectal cancer screening using Cologuard test    Abnormal ECG 03/31/2021   Acute non-recurrent sinusitis 11/30/2020   Acute otitis media 11/30/2020   Benign essential HTN 10/31/2018   Frequent PVCs 09/08/2017   A-fib (HCC) 08/27/2015   Clinical depression 08/27/2015   Blood pressure elevated 08/27/2015   Polypharmacy 08/27/2015   Esophagitis, reflux 08/27/2015   Fatigue 08/27/2015   Acid reflux 08/27/2015   HLD (hyperlipidemia) 08/27/2015   Adult hypothyroidism 08/27/2015   Adaptive colitis 08/27/2015   Malaise and fatigue 08/27/2015   Mild major depression (HCC) 08/27/2015   Polymyalgia rheumatica (HCC) 08/27/2015   Cranial arteritis (HCC) 08/27/2015   Avitaminosis D 08/27/2015    Debara Pickett, SPT  This entire session was performed under direct supervision and direction of a licensed Estate agent . I have personally read, edited and approve of the note as written.   Louis Meckel, PT Physical Therapist - [facility]  2:28 PM 11/03/23

## 2023-11-08 ENCOUNTER — Ambulatory Visit: Payer: Medicare Other

## 2023-11-10 ENCOUNTER — Ambulatory Visit: Payer: Medicare Other

## 2023-11-15 ENCOUNTER — Ambulatory Visit: Payer: Medicare Other

## 2023-11-15 DIAGNOSIS — M6281 Muscle weakness (generalized): Secondary | ICD-10-CM | POA: Diagnosis not present

## 2023-11-15 DIAGNOSIS — R296 Repeated falls: Secondary | ICD-10-CM

## 2023-11-15 DIAGNOSIS — R2681 Unsteadiness on feet: Secondary | ICD-10-CM

## 2023-11-15 DIAGNOSIS — R262 Difficulty in walking, not elsewhere classified: Secondary | ICD-10-CM | POA: Diagnosis not present

## 2023-11-15 NOTE — Therapy (Incomplete)
[facility] Outpatient Rehabilitation at [facility] [address] [place], [place], [address] Phone: [phone]   Fax:  [phone]  Outpatient Physical Therapy Treatment   Patient Details  Name: Tara Marquez MRN: [id number] Date of Birth: 1941/10/22 Referring Provider (PT): Julieanne Manson MD (PCP)   Encounter Date: 11/15/2023            Past Medical History:  Diagnosis Date   Acute pyelonephritis    Adaptive colitis    Anemia    Aortic atherosclerosis (HCC)    Atrial fibrillation and flutter (HCC)    a.) CHA2DS2VASc = 5 (age x 2, sex, HTN, vascular disease history);  b.) rate/rhythm maintained on oral amiodarone + metoprolol succinate; chronically anticoagulated with dose reduced apixaban   CAD (coronary artery disease)    Depression    Descending thoracic aortic aneurysm (HCC)    a.) CT chest 09/29/2022: 4.9 cm; b.) CT abd 01/13/2023: 4.9 cm   Frequent PVCs    GERD (gastroesophageal reflux disease)    Hepatic steatosis    History of 2019 novel coronavirus disease (COVID-19) 12/04/2021   Hyperlipidemia    Hypertension    Hypokalemia    Hypothyroidism    Long term current use of amiodarone    Long term current use of anticoagulant    a.) dose reduced apixaban   Nephrolithiasis    Osteopenia    Polymyalgia rheumatica (HCC)    Skin cancer    Splenomegaly    Temporal arteritis (HCC)    Vitamin D deficiency     Past Surgical History:  Procedure Laterality Date   ABDOMINAL HYSTERECTOMY  03/1971   Status Post Hysterectomy   CATARACT EXTRACTION Left    COLONOSCOPY     2010   COLONOSCOPY WITH PROPOFOL N/A 01/12/2022   Procedure: COLONOSCOPY WITH PROPOFOL;  Surgeon: Midge Minium, MD;  Location: [facility] ENDOSCOPY;  Service: Endoscopy;  Laterality: N/A;   CYSTOSCOPY WITH RETROGRADE PYELOGRAM, URETEROSCOPY AND STENT PLACEMENT Left 01/13/2023   Procedure: CYSTOSCOPY WITH RETROGRADE PYELOGRAM, URETEROSCOPY AND STENT PLACEMENT;  Surgeon:  Riki Altes, MD;  Location: [facility] ORS;  Service: Urology;  Laterality: Left;   CYSTOSCOPY/URETEROSCOPY/HOLMIUM LASER/STENT PLACEMENT Left 02/08/2023   Procedure: CYSTOSCOPY/URETEROSCOPY/HOLMIUM LASER/STENT EXCHANGE;  Surgeon: Riki Altes, MD;  Location: [facility] ORS;  Service: Urology;  Laterality: Left;   ESOPHAGOGASTRODUODENOSCOPY N/A 11/02/2022   Procedure: ESOPHAGOGASTRODUODENOSCOPY (EGD);  Surgeon: Midge Minium, MD;  Location: [facility] ENDOSCOPY;  Service: Endoscopy;  Laterality: N/A;   EYE SURGERY     GIVENS CAPSULE STUDY N/A 12/22/2022   Procedure: GIVENS CAPSULE STUDY;  Surgeon: Midge Minium, MD;  Location: [facility] ENDOSCOPY;  Service: Endoscopy;  Laterality: N/A;   TONSILLECTOMY     TONSILLECTOMY AND ADENOIDECTOMY  1951   WISDOM TOOTH EXTRACTION  1970       Subjective Assessment     Subjective Pt reports no significant changes since last visit. Pt reported no falls since last visit and 0/10 on NPS.    Pertinent History Tara Marquez is an 82yoF who is referred by PCP due to gradual progressive weakness since hospital admission in February 2024. Pt presents to PT eval with husband. Pt reports being in hospital for ~ a week in Feb 2024 with urinary obstruction, AKI, felt week initially but declined HHPT; pt/husband say progressively more weak over subsequent months, more difficulty with walking, husband has now asked her to use a cane. Pt reports 5 falls, 4 of which were outside on natural surface 5th trying to clear a  threshold (posterior fall buttock, back, hit head). Does get dizzy, but not frequently, not related to falls often.    How long can you sit comfortably? Not a limitation    How long can you stand comfortably? Not a limitation    How long can you walk comfortably? Mostly walks household distances, but can tolerate occasional store trips with buggy support.    Currently in Pain? No/denies               Objective measurements completed on examination: See above findings.     TODAY'S TREATMENT: 11/15/23  NMR:  Step tap onto 2nd step with minimal UE support x 12 reps  Lateral side step up/over orange hurdle x 15 reps- trying to no use UE support- Intermittent reaching   THEREX;  Step up onto 1st step for power- x 12 reps each LE.  Sit to stand x 5 (as fast and safe as possible) and 5 more focusing on slow eccentric control  Standing :  Calf raises 3# AW 2 x 12 rep Toe raises 3# AW 2 x 12 rep Hip abduction 2 x 12  3 #AW each LE Hip extension 2 x 12 3# AW each Le  Therapeutic breaks intermittently throughout session due to BLE fatigue.      Marland Kitchen      PT Education -     Education Details Exercise technique- VC for safety    Safety in management of unlevel sidewalk to imrprove step height                      PT Short Term Goals -        PT SHORT TERM GOAL #1   Title compliant with balance HEP    Time 4    Period Weeks    Status Achieved    Target Date 08/12/23      PT SHORT TERM GOAL #2   Title Improved overground gait speed over 582ft to improve ability to access limited community distances safely.    Baseline eval: 391ft at 0.42m/s; 647ft s device 0.8m/s.   Time 4    Period Weeks    Status Achieved     Target Date 08/12/23               PT Long Term Goals -       PT LONG TERM GOAL #1   Title FOTO score improvement >12 points to indicate improve confidence in balance.    Baseline 07/19/2023=43; 08/22/23: 51; 09/08/2023 =will assess next visit; 10/22= 52   Time 12   Period Weeks    Status On going     Target Date 12/01/2023     PT LONG TERM GOAL #2   Title Improvement in standardized balance assessment by MCID or greater.    Baseline 07/19/2023= BERG- 35/56; 08/22/23: 44   Time 8    Period Weeks    Status Achieved     Target Date 09/09/23      PT LONG TERM GOAL #3   Title Pt to demonstrate 5xSTS hands free from standard height surface to indicated improved BLE power.    Baseline 07/19/2023= 31.07 sec without UE  Support; 08/22/23: 19.15sec (hands on thighs); 09/08/2023= 16.42 sec    Time 8    Period Weeks    Status MET   Target Date 09/09/23      PT LONG TERM GOAL #4   Title Pt to demonstrate performance of 2064ft AMB >  0.30m/s without increased pain and without LOB at modI level or less to restore ability to access comunity distances for IADL.    Baseline 08/22/23: 664ft in 4:02 (0.61m/s); 08/25/2023= 1200 in . 9/12= 0.62 m/s   Time 8    Period Weeks    Status Ongoing    Target Date 12/01/23         PT LONG TERM GOAL #5  Title Pt will improve BERG by at least 3 points in order to demonstrate clinically significant improvement in balance.   Baseline 09/08/2023= 46/56  Time 12  Period Weeks   Status NEW  Target Date 12/01/23       PT LONG TERM GOAL #6  Title  Pt will decrease 5TSTS by at least 3 seconds in order to demonstrate clinically significant improvement in LE strength.  Baseline 09/08/2023= 16.42 sec   Time 12  Period Weeks   Status  NEW  Target Date 12/01/23         Plan -     Clinical Impression Statement Pt responded well to treatment today. Pt tolerated all tasks during today's visit. Pt reported no difficulty with stairs going into and out of her house. Pt requested to end visit early, due to having to go to her spouse's medical appointment. This is the reason today's session was limited. Pt will benefit from continued PT services to address and improve her functional strength and mobility to assist in return to previous level of function.    Personal Factors and Comorbidities Age;Fitness;Behavior Pattern;Past/Current Experience    Examination-Activity Limitations Locomotion Level;Transfers;Lift;Stairs    Examination-Participation Restrictions Meal Prep;Cleaning;Community Activity;Driving;Interpersonal Relationship;Yard Work    Stability/Clinical Decision Making Stable/Uncomplicated    Clinical Decision Making Moderate    Rehab Potential Good    PT Frequency 1-2x /  week    PT Duration 12 weeks    PT Treatment/Interventions ADLs/Self Care Home Management;Gait training;Stair training;Functional mobility training;Neuromuscular re-education;Balance training;Therapeutic exercise;Therapeutic activities;Passive range of motion;Manual techniques    PT Next Visit Plan Continue with balance and therex  for improved strength and balance         PT Home Exercise Plan asked at eval to check and log BP twice daily,   Access Code: [email or web address] URL: [email or web address] Date: 07/21/2023 Prepared by: Grier Rocher  Exercises - Sit to Stand with Armchair  - 1 x daily - 5 x weekly - 2 sets - 6 reps - Standing Hip Abduction with Counter Support  - 1 x daily - 7 x weekly - 3 sets - 10 reps - Seated Hip Abduction with Resistance  - 1 x daily - 7 x weekly - 3 sets - 10 reps - Seated March with Resistance  - 1 x daily - 7 x weekly - 3 sets - 10 reps   Consulted and Agree with Plan of Care Patient;Family member/caregiver             Patient will benefit from skilled therapeutic intervention in order to improve the following deficits and impairments:   balance impairments, endurance deficits, reduced strength,   Visit Diagnosis: No diagnosis found.     Problem List Patient Active Problem List   Diagnosis Date Noted   Obstructive nephropathy 01/13/2023   Acute pyelonephritis 01/13/2023   AKI (acute kidney injury) (HCC) 01/13/2023   Hypokalemia 01/13/2023   Diarrhea 01/13/2023   Thoracic aortic aneurysm without rupture (HCC) 11/09/2022   Hematuria 11/05/2022   Polyp of esophagus 11/02/2022   Iron deficiency anemia 07/28/2022  Positive colorectal cancer screening using Cologuard test    Abnormal ECG 03/31/2021   Acute non-recurrent sinusitis 11/30/2020   Acute otitis media 11/30/2020   Benign essential HTN 10/31/2018   Frequent PVCs 09/08/2017   A-fib (HCC) 08/27/2015   Clinical depression 08/27/2015   Blood pressure elevated 08/27/2015    Polypharmacy 08/27/2015   Esophagitis, reflux 08/27/2015   Fatigue 08/27/2015   Acid reflux 08/27/2015   HLD (hyperlipidemia) 08/27/2015   Adult hypothyroidism 08/27/2015   Adaptive colitis 08/27/2015   Malaise and fatigue 08/27/2015   Mild major depression (HCC) 08/27/2015   Polymyalgia rheumatica (HCC) 08/27/2015   Cranial arteritis (HCC) 08/27/2015   Avitaminosis D 08/27/2015    Debara Pickett, SPT  This entire session was performed under direct supervision and direction of a licensed Estate agent . I have personally read, edited and approve of the note as written.   Louis Meckel, PT Physical Therapist - [facility]  8:42 AM 11/15/23

## 2023-11-15 NOTE — Therapy (Signed)
[facility] Outpatient Rehabilitation at [facility] [address] [place], [place], [address] Phone: [phone]   Fax:  [phone]  Outpatient Physical Therapy Treatment   Patient Details  Name: Tara Marquez MRN: [id number] Date of Birth: 06/01/1941 Referring Provider (PT): Julieanne Manson MD (PCP)   Encounter Date: 11/15/2023   PT End of Session - 11/15/23 1616     Visit Number 28    Number of Visits 37    Date for PT Re-Evaluation 12/01/23    Authorization Type Medicare A&B primary with BCBS supplemental secondary    Authorization Time Period 07/14/23-09/08/23    Progress Note Due on Visit 30    PT Start Time 1617    PT Stop Time 1655    PT Time Calculation (min) 38 min    Equipment Utilized During Treatment Gait belt    Activity Tolerance Patient tolerated treatment well    Behavior During Therapy WFL for tasks assessed/performed                    Past Medical History:  Diagnosis Date   Acute pyelonephritis    Adaptive colitis    Anemia    Aortic atherosclerosis (HCC)    Atrial fibrillation and flutter (HCC)    a.) CHA2DS2VASc = 5 (age x 2, sex, HTN, vascular disease history);  b.) rate/rhythm maintained on oral amiodarone + metoprolol succinate; chronically anticoagulated with dose reduced apixaban   CAD (coronary artery disease)    Depression    Descending thoracic aortic aneurysm (HCC)    a.) CT chest 09/29/2022: 4.9 cm; b.) CT abd 01/13/2023: 4.9 cm   Frequent PVCs    GERD (gastroesophageal reflux disease)    Hepatic steatosis    History of 2019 novel coronavirus disease (COVID-19) 12/04/2021   Hyperlipidemia    Hypertension    Hypokalemia    Hypothyroidism    Long term current use of amiodarone    Long term current use of anticoagulant    a.) dose reduced apixaban   Nephrolithiasis    Osteopenia    Polymyalgia rheumatica (HCC)    Skin cancer    Splenomegaly    Temporal arteritis (HCC)    Vitamin D deficiency      Past Surgical History:  Procedure Laterality Date   ABDOMINAL HYSTERECTOMY  03/1971   Status Post Hysterectomy   CATARACT EXTRACTION Left    COLONOSCOPY     2010   COLONOSCOPY WITH PROPOFOL N/A 01/12/2022   Procedure: COLONOSCOPY WITH PROPOFOL;  Surgeon: Midge Minium, MD;  Location: [facility] ENDOSCOPY;  Service: Endoscopy;  Laterality: N/A;   CYSTOSCOPY WITH RETROGRADE PYELOGRAM, URETEROSCOPY AND STENT PLACEMENT Left 01/13/2023   Procedure: CYSTOSCOPY WITH RETROGRADE PYELOGRAM, URETEROSCOPY AND STENT PLACEMENT;  Surgeon: Riki Altes, MD;  Location: [facility] ORS;  Service: Urology;  Laterality: Left;   CYSTOSCOPY/URETEROSCOPY/HOLMIUM LASER/STENT PLACEMENT Left 02/08/2023   Procedure: CYSTOSCOPY/URETEROSCOPY/HOLMIUM LASER/STENT EXCHANGE;  Surgeon: Riki Altes, MD;  Location: [facility] ORS;  Service: Urology;  Laterality: Left;   ESOPHAGOGASTRODUODENOSCOPY N/A 11/02/2022   Procedure: ESOPHAGOGASTRODUODENOSCOPY (EGD);  Surgeon: Midge Minium, MD;  Location: [facility] ENDOSCOPY;  Service: Endoscopy;  Laterality: N/A;   EYE SURGERY     GIVENS CAPSULE STUDY N/A 12/22/2022   Procedure: GIVENS CAPSULE STUDY;  Surgeon: Midge Minium, MD;  Location: [facility] ENDOSCOPY;  Service: Endoscopy;  Laterality: N/A;   TONSILLECTOMY     TONSILLECTOMY AND ADENOIDECTOMY  1951   WISDOM TOOTH EXTRACTION  1970       Subjective  Assessment     Subjective Pt reported that she has been feeling "dizzy" since last week. Pt is unsure if dizziness is due to vertigo or just muscle weakness.    Pertinent History Jatasia Zakowski is an 82yoF who is referred by PCP due to gradual progressive weakness since hospital admission in February 2024. Pt presents to PT eval with husband. Pt reports being in hospital for ~ a week in Feb 2024 with urinary obstruction, AKI, felt week initially but declined HHPT; pt/husband say progressively more weak over subsequent months, more difficulty with walking, husband has now asked her to use a cane. Pt reports  5 falls, 4 of which were outside on natural surface 5th trying to clear a threshold (posterior fall buttock, back, hit head). Does get dizzy, but not frequently, not related to falls often.    How long can you sit comfortably? Not a limitation    How long can you stand comfortably? Not a limitation    How long can you walk comfortably? Mostly walks household distances, but can tolerate occasional store trips with buggy support.    Currently in Pain? No/denies               Objective measurements completed on examination: See above findings.    TODAY'S TREATMENT: 11/16/23  BP was taken before session began, due to pt reporting recent dizziness symptoms BP Reading: 137/76 in sitting, L arm  NMR:  Step tap onto 2nd step with UE support x 12 reps  Lateral side step up/over orange hurdle with UE support x 15 reps each LE (Pt reported dizziness symptoms after this task, BP was taken: 157/82 in sitting, L arm)  THEREX;  Step up onto 1st step for power- x 10 reps each LE.  Sit to stand x 10  Therapeutic breaks intermittently throughout session due to BLE fatigue.  *Session was limited today due to pt's report of dizziness symptoms and BP being taken)     PT Education -     Education Details Exercise technique- VC for safety    Safety in management of unlevel sidewalk to imrprove step height                      PT Short Term Goals -        PT SHORT TERM GOAL #1   Title compliant with balance HEP    Time 4    Period Weeks    Status Achieved    Target Date 08/12/23      PT SHORT TERM GOAL #2   Title Improved overground gait speed over 527ft to improve ability to access limited community distances safely.    Baseline eval: 355ft at 0.57m/s; 625ft s device 0.16m/s.   Time 4    Period Weeks    Status Achieved     Target Date 08/12/23               PT Long Term Goals -       PT LONG TERM GOAL #1   Title FOTO score improvement >12 points to indicate  improve confidence in balance.    Baseline 07/19/2023=43; 08/22/23: 51; 09/08/2023 =will assess next visit; 10/22= 52   Time 12   Period Weeks    Status On going     Target Date 12/01/2023     PT LONG TERM GOAL #2   Title Improvement in standardized balance assessment by MCID or greater.    Baseline 07/19/2023= BERG-  35/56; 08/22/23: 44   Time 8    Period Weeks    Status Achieved     Target Date 09/09/23      PT LONG TERM GOAL #3   Title Pt to demonstrate 5xSTS hands free from standard height surface to indicated improved BLE power.    Baseline 07/19/2023= 31.07 sec without UE Support; 08/22/23: 19.15sec (hands on thighs); 09/08/2023= 16.42 sec    Time 8    Period Weeks    Status MET   Target Date 09/09/23      PT LONG TERM GOAL #4   Title Pt to demonstrate performance of 2041ft AMB >0.80m/s without increased pain and without LOB at modI level or less to restore ability to access comunity distances for IADL.    Baseline 08/22/23: 634ft in 4:02 (0.81m/s); 08/25/2023= 1200 in . 9/12= 0.62 m/s   Time 8    Period Weeks    Status Ongoing    Target Date 12/01/23         PT LONG TERM GOAL #5  Title Pt will improve BERG by at least 3 points in order to demonstrate clinically significant improvement in balance.   Baseline 09/08/2023= 46/56  Time 12  Period Weeks   Status NEW  Target Date 12/01/23       PT LONG TERM GOAL #6  Title  Pt will decrease 5TSTS by at least 3 seconds in order to demonstrate clinically significant improvement in LE strength.  Baseline 09/08/2023= 16.42 sec   Time 12  Period Weeks   Status  NEW  Target Date 12/01/23         Plan -     Clinical Impression Statement Pt reported that she has experienced dizziness symptoms at random times since last visit. Pt's BP was taken at the beginning of today's visit and after she reported symptoms following one of the NMR tasks (see above.) Today's visit was limited, due to monitoring pt's BP, as well as decreased  intensity of tasks in response to pt's reported symptoms. Pt will benefit from continued PT services to address and improve her functional strength and mobility to assist in return to previous level of function.    Personal Factors and Comorbidities Age;Fitness;Behavior Pattern;Past/Current Experience    Examination-Activity Limitations Locomotion Level;Transfers;Lift;Stairs    Examination-Participation Restrictions Meal Prep;Cleaning;Community Activity;Driving;Interpersonal Relationship;Yard Work    Stability/Clinical Decision Making Stable/Uncomplicated    Clinical Decision Making Moderate    Rehab Potential Good    PT Frequency 1-2x / week    PT Duration 12 weeks    PT Treatment/Interventions ADLs/Self Care Home Management;Gait training;Stair training;Functional mobility training;Neuromuscular re-education;Balance training;Therapeutic exercise;Therapeutic activities;Passive range of motion;Manual techniques    PT Next Visit Plan Continue with balance and therex  for improved strength and balance         PT Home Exercise Plan asked at eval to check and log BP twice daily,   Access Code: [email or web address] URL: [email or web address] Date: 07/21/2023 Prepared by: Grier Rocher  Exercises - Sit to Stand with Armchair  - 1 x daily - 5 x weekly - 2 sets - 6 reps - Standing Hip Abduction with Counter Support  - 1 x daily - 7 x weekly - 3 sets - 10 reps - Seated Hip Abduction with Resistance  - 1 x daily - 7 x weekly - 3 sets - 10 reps - Seated March with Resistance  - 1 x daily - 7 x weekly - 3 sets - 10 reps   Consulted  and Agree with Plan of Care Patient;Family member/caregiver             Patient will benefit from skilled therapeutic intervention in order to improve the following deficits and impairments:   balance impairments, endurance deficits, reduced strength,   Visit Diagnosis: Repeated falls  Difficulty in walking, not elsewhere classified  Muscle weakness  (generalized)  Unsteadiness on feet     Problem List Patient Active Problem List   Diagnosis Date Noted   Obstructive nephropathy 01/13/2023   Acute pyelonephritis 01/13/2023   AKI (acute kidney injury) (HCC) 01/13/2023   Hypokalemia 01/13/2023   Diarrhea 01/13/2023   Thoracic aortic aneurysm without rupture (HCC) 11/09/2022   Hematuria 11/05/2022   Polyp of esophagus 11/02/2022   Iron deficiency anemia 07/28/2022   Positive colorectal cancer screening using Cologuard test    Abnormal ECG 03/31/2021   Acute non-recurrent sinusitis 11/30/2020   Acute otitis media 11/30/2020   Benign essential HTN 10/31/2018   Frequent PVCs 09/08/2017   A-fib (HCC) 08/27/2015   Clinical depression 08/27/2015   Blood pressure elevated 08/27/2015   Polypharmacy 08/27/2015   Esophagitis, reflux 08/27/2015   Fatigue 08/27/2015   Acid reflux 08/27/2015   HLD (hyperlipidemia) 08/27/2015   Adult hypothyroidism 08/27/2015   Adaptive colitis 08/27/2015   Malaise and fatigue 08/27/2015   Mild major depression (HCC) 08/27/2015   Polymyalgia rheumatica (HCC) 08/27/2015   Cranial arteritis (HCC) 08/27/2015   Avitaminosis D 08/27/2015    Debara Pickett, SPT  This entire session was performed under direct supervision and direction of a licensed Estate agent . I have personally read, edited and approve of the note as written.   Louis Meckel, PT Physical Therapist - [facility]  8:05 AM 11/16/23

## 2023-11-21 ENCOUNTER — Ambulatory Visit: Payer: Medicare Other

## 2023-11-21 DIAGNOSIS — M6281 Muscle weakness (generalized): Secondary | ICD-10-CM | POA: Diagnosis not present

## 2023-11-21 DIAGNOSIS — R262 Difficulty in walking, not elsewhere classified: Secondary | ICD-10-CM

## 2023-11-21 DIAGNOSIS — R296 Repeated falls: Secondary | ICD-10-CM | POA: Diagnosis not present

## 2023-11-21 DIAGNOSIS — R2681 Unsteadiness on feet: Secondary | ICD-10-CM

## 2023-11-21 NOTE — Therapy (Signed)
[facility] Outpatient Rehabilitation at [facility] [address] [place], [place], [address] Phone: [phone]   Fax:  [phone]  Outpatient Physical Therapy Treatment   Patient Details  Name: Tara Marquez MRN: [id number] Date of Birth: 02/08/1941 Referring Provider (PT): Julieanne Manson MD (PCP)   Encounter Date: 11/21/2023   PT End of Session - 11/21/23 0857     Visit Number 29    Number of Visits 37    Date for PT Re-Evaluation 12/01/23    Authorization Type Medicare A&B primary with BCBS supplemental secondary    Authorization Time Period 07/14/23-09/08/23    Progress Note Due on Visit 30    PT Start Time 0858    PT Stop Time 0928    PT Time Calculation (min) 30 min    Equipment Utilized During Treatment Gait belt    Activity Tolerance Patient tolerated treatment well    Behavior During Therapy WFL for tasks assessed/performed                    Past Medical History:  Diagnosis Date   Acute pyelonephritis    Adaptive colitis    Anemia    Aortic atherosclerosis (HCC)    Atrial fibrillation and flutter (HCC)    a.) CHA2DS2VASc = 5 (age x 2, sex, HTN, vascular disease history);  b.) rate/rhythm maintained on oral amiodarone + metoprolol succinate; chronically anticoagulated with dose reduced apixaban   CAD (coronary artery disease)    Depression    Descending thoracic aortic aneurysm (HCC)    a.) CT chest 09/29/2022: 4.9 cm; b.) CT abd 01/13/2023: 4.9 cm   Frequent PVCs    GERD (gastroesophageal reflux disease)    Hepatic steatosis    History of 2019 novel coronavirus disease (COVID-19) 12/04/2021   Hyperlipidemia    Hypertension    Hypokalemia    Hypothyroidism    Long term current use of amiodarone    Long term current use of anticoagulant    a.) dose reduced apixaban   Nephrolithiasis    Osteopenia    Polymyalgia rheumatica (HCC)    Skin cancer    Splenomegaly    Temporal arteritis (HCC)    Vitamin D deficiency      Past Surgical History:  Procedure Laterality Date   ABDOMINAL HYSTERECTOMY  03/1971   Status Post Hysterectomy   CATARACT EXTRACTION Left    COLONOSCOPY     2010   COLONOSCOPY WITH PROPOFOL N/A 01/12/2022   Procedure: COLONOSCOPY WITH PROPOFOL;  Surgeon: Midge Minium, MD;  Location: [facility] ENDOSCOPY;  Service: Endoscopy;  Laterality: N/A;   CYSTOSCOPY WITH RETROGRADE PYELOGRAM, URETEROSCOPY AND STENT PLACEMENT Left 01/13/2023   Procedure: CYSTOSCOPY WITH RETROGRADE PYELOGRAM, URETEROSCOPY AND STENT PLACEMENT;  Surgeon: Riki Altes, MD;  Location: [facility] ORS;  Service: Urology;  Laterality: Left;   CYSTOSCOPY/URETEROSCOPY/HOLMIUM LASER/STENT PLACEMENT Left 02/08/2023   Procedure: CYSTOSCOPY/URETEROSCOPY/HOLMIUM LASER/STENT EXCHANGE;  Surgeon: Riki Altes, MD;  Location: [facility] ORS;  Service: Urology;  Laterality: Left;   ESOPHAGOGASTRODUODENOSCOPY N/A 11/02/2022   Procedure: ESOPHAGOGASTRODUODENOSCOPY (EGD);  Surgeon: Midge Minium, MD;  Location: [facility] ENDOSCOPY;  Service: Endoscopy;  Laterality: N/A;   EYE SURGERY     GIVENS CAPSULE STUDY N/A 12/22/2022   Procedure: GIVENS CAPSULE STUDY;  Surgeon: Midge Minium, MD;  Location: [facility] ENDOSCOPY;  Service: Endoscopy;  Laterality: N/A;   TONSILLECTOMY     TONSILLECTOMY AND ADENOIDECTOMY  1951   WISDOM TOOTH EXTRACTION  1970       Subjective  Assessment     Subjective Pt reported that dizziness symptoms have improved since last visit, put minor episodes still occur at random times. Pt reported no falls and 0/10 on NPS.    Pertinent History Tara Marquez is an 82yoF who is referred by PCP due to gradual progressive weakness since hospital admission in February 2024. Pt presents to PT eval with husband. Pt reports being in hospital for ~ a week in Feb 2024 with urinary obstruction, AKI, felt week initially but declined HHPT; pt/husband say progressively more weak over subsequent months, more difficulty with walking, husband has now asked her to  use a cane. Pt reports 5 falls, 4 of which were outside on natural surface 5th trying to clear a threshold (posterior fall buttock, back, hit head). Does get dizzy, but not frequently, not related to falls often.    How long can you sit comfortably? Not a limitation    How long can you stand comfortably? Not a limitation    How long can you walk comfortably? Mostly walks household distances, but can tolerate occasional store trips with buggy support.    Currently in Pain? No/denies               Objective measurements completed on examination: See above findings.    TODAY'S TREATMENT: 11/21/23  NMR:  Step tap onto 2nd step with UE support x 10 reps each LE Lateral side step up/over orange hurdle with UE support x 15 reps each LE   THEREX;  Step up onto 1st step for power- x 12 reps each LE.  Sit to stand x 10 without UE support  Standing :  Calf raises 2.5# AW 2 x 12 rep each LE with UE support  Toe raises 2.5# AW 2 x 12 rep each LE with UE support  Hip abduction 2 x 12 2.5# AW each LE with UE support    Therapeutic breaks intermittently throughout session due to BLE fatigue.  *Session was limited today due to pt arriving late    PT Education -     Education Details Exercise technique- VC for safety    Safety in management of unlevel sidewalk to imrprove step height                      PT Short Term Goals -        PT SHORT TERM GOAL #1   Title compliant with balance HEP    Time 4    Period Weeks    Status Achieved    Target Date 08/12/23      PT SHORT TERM GOAL #2   Title Improved overground gait speed over 52ft to improve ability to access limited community distances safely.    Baseline eval: 358ft at 0.25m/s; 65ft s device 0.57m/s.   Time 4    Period Weeks    Status Achieved     Target Date 08/12/23               PT Long Term Goals -       PT LONG TERM GOAL #1   Title FOTO score improvement >12 points to indicate improve confidence  in balance.    Baseline 07/19/2023=43; 08/22/23: 51; 09/08/2023 =will assess next visit; 10/22= 52   Time 12   Period Weeks    Status On going     Target Date 12/01/2023     PT LONG TERM GOAL #2   Title Improvement in standardized balance assessment  by MCID or greater.    Baseline 07/19/2023= BERG- 35/56; 08/22/23: 44   Time 8    Period Weeks    Status Achieved     Target Date 09/09/23      PT LONG TERM GOAL #3   Title Pt to demonstrate 5xSTS hands free from standard height surface to indicated improved BLE power.    Baseline 07/19/2023= 31.07 sec without UE Support; 08/22/23: 19.15sec (hands on thighs); 09/08/2023= 16.42 sec    Time 8    Period Weeks    Status MET   Target Date 09/09/23      PT LONG TERM GOAL #4   Title Pt to demonstrate performance of 2028ft AMB >0.68m/s without increased pain and without LOB at modI level or less to restore ability to access comunity distances for IADL.    Baseline 08/22/23: 621ft in 4:02 (0.81m/s); 08/25/2023= 1200 in . 9/12= 0.62 m/s   Time 8    Period Weeks    Status Ongoing    Target Date 12/01/23         PT LONG TERM GOAL #5  Title Pt will improve BERG by at least 3 points in order to demonstrate clinically significant improvement in balance.   Baseline 09/08/2023= 46/56  Time 12  Period Weeks   Status NEW  Target Date 12/01/23       PT LONG TERM GOAL #6  Title  Pt will decrease 5TSTS by at least 3 seconds in order to demonstrate clinically significant improvement in LE strength.  Baseline 09/08/2023= 16.42 sec   Time 12  Period Weeks   Status  NEW  Target Date 12/01/23         Plan -     Clinical Impression Statement Pt tolerated all tasks during today's visit. Pt did not report any dizziness symptoms during today's visit and stated that she felt better completing the tasks today compared to last visit. Today's visit was limited due to the patient arriving late. Pt will benefit from continued PT services to address and  improve her functional strength and mobility to assist in return to previous level of function.    Personal Factors and Comorbidities Age;Fitness;Behavior Pattern;Past/Current Experience    Examination-Activity Limitations Locomotion Level;Transfers;Lift;Stairs    Examination-Participation Restrictions Meal Prep;Cleaning;Community Activity;Driving;Interpersonal Relationship;Yard Work    Stability/Clinical Decision Making Stable/Uncomplicated    Clinical Decision Making Moderate    Rehab Potential Good    PT Frequency 1-2x / week    PT Duration 12 weeks    PT Treatment/Interventions ADLs/Self Care Home Management;Gait training;Stair training;Functional mobility training;Neuromuscular re-education;Balance training;Therapeutic exercise;Therapeutic activities;Passive range of motion;Manual techniques    PT Next Visit Plan Continue with balance and therex  for improved strength and balance         PT Home Exercise Plan asked at eval to check and log BP twice daily,   Access Code: [email or web address] URL: [email or web address] Date: 07/21/2023 Prepared by: Grier Rocher  Exercises - Sit to Stand with Armchair  - 1 x daily - 5 x weekly - 2 sets - 6 reps - Standing Hip Abduction with Counter Support  - 1 x daily - 7 x weekly - 3 sets - 10 reps - Seated Hip Abduction with Resistance  - 1 x daily - 7 x weekly - 3 sets - 10 reps - Seated March with Resistance  - 1 x daily - 7 x weekly - 3 sets - 10 reps   Consulted and Agree with Plan of Care Patient;Family member/caregiver  Patient will benefit from skilled therapeutic intervention in order to improve the following deficits and impairments:   balance impairments, endurance deficits, reduced strength,   Visit Diagnosis: Repeated falls  Difficulty in walking, not elsewhere classified  Muscle weakness (generalized)  Unsteadiness on feet     Problem List Patient Active Problem List   Diagnosis Date Noted    Obstructive nephropathy 01/13/2023   Acute pyelonephritis 01/13/2023   AKI (acute kidney injury) (HCC) 01/13/2023   Hypokalemia 01/13/2023   Diarrhea 01/13/2023   Thoracic aortic aneurysm without rupture (HCC) 11/09/2022   Hematuria 11/05/2022   Polyp of esophagus 11/02/2022   Iron deficiency anemia 07/28/2022   Positive colorectal cancer screening using Cologuard test    Abnormal ECG 03/31/2021   Acute non-recurrent sinusitis 11/30/2020   Acute otitis media 11/30/2020   Benign essential HTN 10/31/2018   Frequent PVCs 09/08/2017   A-fib (HCC) 08/27/2015   Clinical depression 08/27/2015   Blood pressure elevated 08/27/2015   Polypharmacy 08/27/2015   Esophagitis, reflux 08/27/2015   Fatigue 08/27/2015   Acid reflux 08/27/2015   HLD (hyperlipidemia) 08/27/2015   Adult hypothyroidism 08/27/2015   Adaptive colitis 08/27/2015   Malaise and fatigue 08/27/2015   Mild major depression (HCC) 08/27/2015   Polymyalgia rheumatica (HCC) 08/27/2015   Cranial arteritis (HCC) 08/27/2015   Avitaminosis D 08/27/2015    Debara Pickett, SPT  This entire session was performed under direct supervision and direction of a licensed Estate agent . I have personally read, edited and approve of the note as written.   Louis Meckel, PT Physical Therapist - [facility]  10:59 AM 11/21/23

## 2023-12-01 ENCOUNTER — Ambulatory Visit: Payer: Medicare Other | Attending: Family Medicine

## 2023-12-01 DIAGNOSIS — R262 Difficulty in walking, not elsewhere classified: Secondary | ICD-10-CM | POA: Insufficient documentation

## 2023-12-01 DIAGNOSIS — M6281 Muscle weakness (generalized): Secondary | ICD-10-CM | POA: Insufficient documentation

## 2023-12-01 DIAGNOSIS — R2681 Unsteadiness on feet: Secondary | ICD-10-CM | POA: Diagnosis not present

## 2023-12-01 DIAGNOSIS — R296 Repeated falls: Secondary | ICD-10-CM | POA: Insufficient documentation

## 2023-12-01 NOTE — Therapy (Signed)
[facility] Outpatient Rehabilitation at [facility] [address] [place], [place], [address] Phone: [phone]   Fax:  [phone]  Outpatient Physical Therapy Treatment/Recert/Physical Therapy Progress Note   Dates of reporting period  10/16/2023   to   12/01/2023    Patient Details  Name: Tara Marquez MRN: [id number] Date of Birth: 1941-07-20 Referring Provider (PT): Julieanne Manson MD (PCP)   Encounter Date: 12/01/2023   PT End of Session - 12/01/23 0915     Visit Number 30    Number of Visits 54    Date for PT Re-Evaluation 02/23/24    Authorization Type Medicare A&B primary with BCBS supplemental secondary    Authorization Time Period 07/14/23-09/08/23    Progress Note Due on Visit 30    PT Start Time 0854    PT Stop Time 0927    PT Time Calculation (min) 33 min    Equipment Utilized During Treatment Gait belt    Activity Tolerance Patient tolerated treatment well    Behavior During Therapy WFL for tasks assessed/performed                     Past Medical History:  Diagnosis Date   Acute pyelonephritis    Adaptive colitis    Anemia    Aortic atherosclerosis (HCC)    Atrial fibrillation and flutter (HCC)    a.) CHA2DS2VASc = 5 (age x 2, sex, HTN, vascular disease history);  b.) rate/rhythm maintained on oral amiodarone + metoprolol succinate; chronically anticoagulated with dose reduced apixaban   CAD (coronary artery disease)    Depression    Descending thoracic aortic aneurysm (HCC)    a.) CT chest 09/29/2022: 4.9 cm; b.) CT abd 01/13/2023: 4.9 cm   Frequent PVCs    GERD (gastroesophageal reflux disease)    Hepatic steatosis    History of 2019 novel coronavirus disease (COVID-19) 12/04/2021   Hyperlipidemia    Hypertension    Hypokalemia    Hypothyroidism    Long term current use of amiodarone    Long term current use of anticoagulant    a.) dose reduced apixaban   Nephrolithiasis    Osteopenia    Polymyalgia  rheumatica (HCC)    Skin cancer    Splenomegaly    Temporal arteritis (HCC)    Vitamin D deficiency     Past Surgical History:  Procedure Laterality Date   ABDOMINAL HYSTERECTOMY  03/1971   Status Post Hysterectomy   CATARACT EXTRACTION Left    COLONOSCOPY     2010   COLONOSCOPY WITH PROPOFOL N/A 01/12/2022   Procedure: COLONOSCOPY WITH PROPOFOL;  Surgeon: Midge Minium, MD;  Location: [facility] ENDOSCOPY;  Service: Endoscopy;  Laterality: N/A;   CYSTOSCOPY WITH RETROGRADE PYELOGRAM, URETEROSCOPY AND STENT PLACEMENT Left 01/13/2023   Procedure: CYSTOSCOPY WITH RETROGRADE PYELOGRAM, URETEROSCOPY AND STENT PLACEMENT;  Surgeon: Riki Altes, MD;  Location: [facility] ORS;  Service: Urology;  Laterality: Left;   CYSTOSCOPY/URETEROSCOPY/HOLMIUM LASER/STENT PLACEMENT Left 02/08/2023   Procedure: CYSTOSCOPY/URETEROSCOPY/HOLMIUM LASER/STENT EXCHANGE;  Surgeon: Riki Altes, MD;  Location: [facility] ORS;  Service: Urology;  Laterality: Left;   ESOPHAGOGASTRODUODENOSCOPY N/A 11/02/2022   Procedure: ESOPHAGOGASTRODUODENOSCOPY (EGD);  Surgeon: Midge Minium, MD;  Location: [facility] ENDOSCOPY;  Service: Endoscopy;  Laterality: N/A;   EYE SURGERY     GIVENS CAPSULE STUDY N/A 12/22/2022   Procedure: GIVENS CAPSULE STUDY;  Surgeon: Midge Minium, MD;  Location: [facility] ENDOSCOPY;  Service: Endoscopy;  Laterality: N/A;   TONSILLECTOMY  TONSILLECTOMY AND ADENOIDECTOMY  1951   WISDOM TOOTH EXTRACTION  1970       Subjective Assessment     Subjective Patient states she has been sick with a cold for past week. Reports doing better now with just lingering cough. States no further dizziness and reported no falls and 0/10 on NPS.    Pertinent History Tara Marquez is an 82yoF who is referred by PCP due to gradual progressive weakness since hospital admission in February 2024. Pt presents to PT eval with husband. Pt reports being in hospital for ~ a week in Feb 2024 with urinary obstruction, AKI, felt week initially but  declined HHPT; pt/husband say progressively more weak over subsequent months, more difficulty with walking, husband has now asked her to use a cane. Pt reports 5 falls, 4 of which were outside on natural surface 5th trying to clear a threshold (posterior fall buttock, back, hit head). Does get dizzy, but not frequently, not related to falls often.    How long can you sit comfortably? Not a limitation    How long can you stand comfortably? Not a limitation    How long can you walk comfortably? Mostly walks household distances, but can tolerate occasional store trips with buggy support.    Currently in Pain? No/denies               Objective measurements completed on examination: See above findings.    TODAY'S TREATMENT: 12/01/23  Physical therapy treatment session today consisted of completing assessment of goals and administration of testing as demonstrated and documented in flow sheet, treatment, and goals section of this note. Addition treatments may be found below.   *Session was limited today due to pt arriving late   [facility] PT Assessment - 12/01/23 0916       Berg Balance Test   Sit to Stand Able to stand without using hands and stabilize independently    Standing Unsupported Able to stand safely 2 minutes    Sitting with Back Unsupported but Feet Supported on Floor or Stool Able to sit safely and securely 2 minutes    Stand to Sit Sits safely with minimal use of hands    Transfers Able to transfer safely, minor use of hands    Standing Unsupported with Eyes Closed Able to stand 10 seconds safely    Standing Unsupported with Feet Together Able to place feet together independently and stand 1 minute safely    From Standing, Reach Forward with Outstretched Arm Can reach forward >12 cm safely (5")    From Standing Position, Pick up Object from Floor Able to pick up shoe, needs supervision    From Standing Position, Turn to Look Behind Over each Shoulder Looks behind one side  only/other side shows less weight shift    Turn 360 Degrees Able to turn 360 degrees safely but slowly    Standing Unsupported, Alternately Place Feet on Step/Stool Able to stand independently and complete 8 steps >20 seconds    Standing Unsupported, One Foot in Front Able to place foot tandem independently and hold 30 seconds    Standing on One Leg Tries to lift leg/unable to hold 3 seconds but remains standing independently    Total Score 47                PT Education -     Education Details Exercise technique- VC for safety    Safety in management of unlevel sidewalk to imrprove step height  PT Short Term Goals -        PT SHORT TERM GOAL #1   Title compliant with balance HEP    Time 4    Period Weeks    Status Achieved    Target Date 08/12/23      PT SHORT TERM GOAL #2   Title Improved overground gait speed over 556ft to improve ability to access limited community distances safely.    Baseline eval: 335ft at 0.34m/s; 638ft s device 0.30m/s.   Time 4    Period Weeks    Status Achieved     Target Date 08/12/23               PT Long Term Goals -       PT LONG TERM GOAL #1   Title FOTO score improvement >12 points to indicate improve confidence in balance.    Baseline 07/19/2023=43; 08/22/23: 51; 09/08/2023 =will assess next visit; 10/22= 52; 12/01/2023= 57   Time 12   Period Weeks    Status MET   Target Date 12/01/2023     PT LONG TERM GOAL #2   Title Improvement in standardized balance assessment by MCID or greater.    Baseline 07/19/2023= BERG- 35/56; 08/22/23: 44   Time 8    Period Weeks    Status Achieved     Target Date 09/09/23      PT LONG TERM GOAL #3   Title Pt to demonstrate 5xSTS hands free from standard height surface to indicated improved BLE power.    Baseline 07/19/2023= 31.07 sec without UE Support; 08/22/23: 19.15sec (hands on thighs); 09/08/2023= 16.42 sec; 12/01/2023= 14.42 sec without UE support    Time 8     Period Weeks    Status MET   Target Date 09/09/23      PT LONG TERM GOAL #4   Title Pt to demonstrate performance of 2023ft AMB >0.65m/s without increased pain and without LOB at modI level or less to restore ability to access comunity distances for IADL.    Baseline 08/22/23: 656ft in 4:02 (0.60m/s); 08/25/2023= 1200 in . 9/12= 0.62 m/s; 12/01/2023= will reassess next visit   Time 8    Period Weeks    Status Ongoing    Target Date 02/23/2024        PT LONG TERM GOAL #5  Title Pt will improve BERG by at least 3 points in order to demonstrate clinically significant improvement in balance.   Baseline 09/08/2023= 46/56; 12/01/2023= 47/56  Time 12  Period Weeks   Status PROGRESSING  Target Date 02/23/2024      PT LONG TERM GOAL #6  Title  Pt will decrease 5TSTS by at least 3 seconds in order to demonstrate clinically significant improvement in LE strength.  Baseline 09/08/2023= 16.42 sec 12/01/2023= 14.42 sec without UE support   Time 12  Period Weeks   Status  PROGRESSING  Target Date 02/23/2024        Plan -     Clinical Impression Statement Patient was motivated well for today's session. Today's visit was limited due to the patient arriving late. Reassessed several goal but ran out of time and unable to test her endurance/gait distance goal. Otherwise patient is making steady progress and much improved overall since initial evaluation. She is making progress with LE strength, balance and her overall confidence in her balance. Patient's condition has the potential to improve in response to therapy. Maximum improvement is yet to be obtained. The anticipated improvement  is attainable and reasonable in a generally predictable time.  Pt will benefit from continued PT services to address and improve her functional strength and mobility to assist in return to previous level of function.    Personal Factors and Comorbidities Age;Fitness;Behavior Pattern;Past/Current Experience     Examination-Activity Limitations Locomotion Level;Transfers;Lift;Stairs    Examination-Participation Restrictions Meal Prep;Cleaning;Community Activity;Driving;Interpersonal Relationship;Yard Work    Stability/Clinical Decision Making Stable/Uncomplicated    Clinical Decision Making Moderate    Rehab Potential Good    PT Frequency 1-2x / week    PT Duration 12 weeks    PT Treatment/Interventions ADLs/Self Care Home Management;Gait training;Stair training;Functional mobility training;Neuromuscular re-education;Balance training;Therapeutic exercise;Therapeutic activities;Passive range of motion;Manual techniques    PT Next Visit Plan Continue with balance and therex  for improved strength and balance         PT Home Exercise Plan asked at eval to check and log BP twice daily,   Access Code: [email or web address] URL: [email or web address] Date: 07/21/2023 Prepared by: Grier Rocher  Exercises - Sit to Stand with Armchair  - 1 x daily - 5 x weekly - 2 sets - 6 reps - Standing Hip Abduction with Counter Support  - 1 x daily - 7 x weekly - 3 sets - 10 reps - Seated Hip Abduction with Resistance  - 1 x daily - 7 x weekly - 3 sets - 10 reps - Seated March with Resistance  - 1 x daily - 7 x weekly - 3 sets - 10 reps   Consulted and Agree with Plan of Care Patient;Family member/caregiver             Patient will benefit from skilled therapeutic intervention in order to improve the following deficits and impairments:   balance impairments, endurance deficits, reduced strength,   Visit Diagnosis: Repeated falls - Plan: PT plan of care cert/re-cert  Difficulty in walking, not elsewhere classified - Plan: PT plan of care cert/re-cert  Muscle weakness (generalized) - Plan: PT plan of care cert/re-cert  Unsteadiness on feet - Plan: PT plan of care cert/re-cert     Problem List Patient Active Problem List   Diagnosis Date Noted   Obstructive nephropathy 01/13/2023   Acute  pyelonephritis 01/13/2023   AKI (acute kidney injury) (HCC) 01/13/2023   Hypokalemia 01/13/2023   Diarrhea 01/13/2023   Thoracic aortic aneurysm without rupture (HCC) 11/09/2022   Hematuria 11/05/2022   Polyp of esophagus 11/02/2022   Iron deficiency anemia 07/28/2022   Positive colorectal cancer screening using Cologuard test    Abnormal ECG 03/31/2021   Acute non-recurrent sinusitis 11/30/2020   Acute otitis media 11/30/2020   Benign essential HTN 10/31/2018   Frequent PVCs 09/08/2017   A-fib (HCC) 08/27/2015   Clinical depression 08/27/2015   Blood pressure elevated 08/27/2015   Polypharmacy 08/27/2015   Esophagitis, reflux 08/27/2015   Fatigue 08/27/2015   Acid reflux 08/27/2015   HLD (hyperlipidemia) 08/27/2015   Adult hypothyroidism 08/27/2015   Adaptive colitis 08/27/2015   Malaise and fatigue 08/27/2015   Mild major depression (HCC) 08/27/2015   Polymyalgia rheumatica (HCC) 08/27/2015   Cranial arteritis (HCC) 08/27/2015   Avitaminosis D 08/27/2015      Louis Meckel, PT Physical Therapist - [place]  [facility]  10:04 AM 12/01/23

## 2023-12-08 ENCOUNTER — Ambulatory Visit: Payer: Medicare Other

## 2023-12-08 DIAGNOSIS — M6281 Muscle weakness (generalized): Secondary | ICD-10-CM | POA: Diagnosis not present

## 2023-12-08 DIAGNOSIS — R262 Difficulty in walking, not elsewhere classified: Secondary | ICD-10-CM

## 2023-12-08 DIAGNOSIS — R2681 Unsteadiness on feet: Secondary | ICD-10-CM | POA: Diagnosis not present

## 2023-12-08 DIAGNOSIS — R296 Repeated falls: Secondary | ICD-10-CM | POA: Diagnosis not present

## 2023-12-08 NOTE — Therapy (Signed)
[facility] Outpatient Rehabilitation at [facility] [address] [place], [place], [address] Phone: [phone]   Fax:  [phone]  Outpatient Physical Therapy Treatment   Patient Details  Name: Tara Marquez MRN: [id number] Date of Birth: 05-Mar-1941 Referring Provider (PT): Julieanne Manson MD (PCP)   Encounter Date: 12/08/2023   PT End of Session - 12/08/23 1145     Visit Number 31    Number of Visits 54    Date for PT Re-Evaluation 02/23/24    Authorization Type Medicare A&B primary with BCBS supplemental secondary    Authorization Time Period 07/14/23-09/08/23    Progress Note Due on Visit 30    PT Start Time 1145    PT Stop Time 1230    PT Time Calculation (min) 45 min    Equipment Utilized During Treatment Gait belt    Activity Tolerance Patient tolerated treatment well    Behavior During Therapy WFL for tasks assessed/performed                      Past Medical History:  Diagnosis Date   Acute pyelonephritis    Adaptive colitis    Anemia    Aortic atherosclerosis (HCC)    Atrial fibrillation and flutter (HCC)    a.) CHA2DS2VASc = 5 (age x 2, sex, HTN, vascular disease history);  b.) rate/rhythm maintained on oral amiodarone + metoprolol succinate; chronically anticoagulated with dose reduced apixaban   CAD (coronary artery disease)    Depression    Descending thoracic aortic aneurysm (HCC)    a.) CT chest 09/29/2022: 4.9 cm; b.) CT abd 01/13/2023: 4.9 cm   Frequent PVCs    GERD (gastroesophageal reflux disease)    Hepatic steatosis    History of 2019 novel coronavirus disease (COVID-19) 12/04/2021   Hyperlipidemia    Hypertension    Hypokalemia    Hypothyroidism    Long term current use of amiodarone    Long term current use of anticoagulant    a.) dose reduced apixaban   Nephrolithiasis    Osteopenia    Polymyalgia rheumatica (HCC)    Skin cancer    Splenomegaly    Temporal arteritis (HCC)    Vitamin D deficiency      Past Surgical History:  Procedure Laterality Date   ABDOMINAL HYSTERECTOMY  03/1971   Status Post Hysterectomy   CATARACT EXTRACTION Left    COLONOSCOPY     2010   COLONOSCOPY WITH PROPOFOL N/A 01/12/2022   Procedure: COLONOSCOPY WITH PROPOFOL;  Surgeon: Midge Minium, MD;  Location: [facility] ENDOSCOPY;  Service: Endoscopy;  Laterality: N/A;   CYSTOSCOPY WITH RETROGRADE PYELOGRAM, URETEROSCOPY AND STENT PLACEMENT Left 01/13/2023   Procedure: CYSTOSCOPY WITH RETROGRADE PYELOGRAM, URETEROSCOPY AND STENT PLACEMENT;  Surgeon: Riki Altes, MD;  Location: [facility] ORS;  Service: Urology;  Laterality: Left;   CYSTOSCOPY/URETEROSCOPY/HOLMIUM LASER/STENT PLACEMENT Left 02/08/2023   Procedure: CYSTOSCOPY/URETEROSCOPY/HOLMIUM LASER/STENT EXCHANGE;  Surgeon: Riki Altes, MD;  Location: [facility] ORS;  Service: Urology;  Laterality: Left;   ESOPHAGOGASTRODUODENOSCOPY N/A 11/02/2022   Procedure: ESOPHAGOGASTRODUODENOSCOPY (EGD);  Surgeon: Midge Minium, MD;  Location: [facility] ENDOSCOPY;  Service: Endoscopy;  Laterality: N/A;   EYE SURGERY     GIVENS CAPSULE STUDY N/A 12/22/2022   Procedure: GIVENS CAPSULE STUDY;  Surgeon: Midge Minium, MD;  Location: [facility] ENDOSCOPY;  Service: Endoscopy;  Laterality: N/A;   TONSILLECTOMY     TONSILLECTOMY AND ADENOIDECTOMY  1951   WISDOM TOOTH EXTRACTION  1970  Subjective Assessment     Subjective Patient reports doing well without any significant issues since last visit.     Pertinent History Tara Marquez is an 82yoF who is referred by PCP due to gradual progressive weakness since hospital admission in February 2024. Pt presents to PT eval with husband. Pt reports being in hospital for ~ a week in Feb 2024 with urinary obstruction, AKI, felt week initially but declined HHPT; pt/husband say progressively more weak over subsequent months, more difficulty with walking, husband has now asked her to use a cane. Pt reports 5 falls, 4 of which were outside on natural surface  5th trying to clear a threshold (posterior fall buttock, back, hit head). Does get dizzy, but not frequently, not related to falls often.    How long can you sit comfortably? Not a limitation    How long can you stand comfortably? Not a limitation    How long can you walk comfortably? Mostly walks household distances, but can tolerate occasional store trips with buggy support.    Currently in Pain? No/denies               Objective measurements completed on examination: See above findings.    TODAY'S TREATMENT: 12/08/23  NMR:   Lateral step up/over -2x 1/2 foam roll x 10 reps. Forward step up/over x 2 of 1/2 foam rolls - without UE support Static stand on airex pad - EO x 30 sec (no unsteadiness then progressed)  Static stand on airex pad- EC x 30 sec x 2 (mild increased sway)  Dynamic lateral weight shifts (standing on airex pad) x 20 reps  Dynamic LE marching in place on airex pad x 20 reps (minimal step height)  Gait in hallway-  including normal walking and 1 lap including horizontal head turns all without Assistive device- 300 feet  Dynamic standing at support bar/dry erase board- feet near tandem while arranging magnet letters into colored orders- mostly ankle righting reaction.   THEREX:   Sit to stand  2 sets x 10 without UE support         PT Education -     Education Details Exercise technique- VC for safety    Safety in management of unlevel sidewalk to imrprove step height                      PT Short Term Goals -        PT SHORT TERM GOAL #1   Title compliant with balance HEP    Time 4    Period Weeks    Status Achieved    Target Date 08/12/23      PT SHORT TERM GOAL #2   Title Improved overground gait speed over 570ft to improve ability to access limited community distances safely.    Baseline eval: 356ft at 0.74m/s; 627ft s device 0.12m/s.   Time 4    Period Weeks    Status Achieved     Target Date 08/12/23               PT  Long Term Goals -       PT LONG TERM GOAL #1   Title FOTO score improvement >12 points to indicate improve confidence in balance.    Baseline 07/19/2023=43; 08/22/23: 51; 09/08/2023 =will assess next visit; 10/22= 52; 12/01/2023= 57   Time 12   Period Weeks    Status MET   Target Date 12/01/2023     PT  LONG TERM GOAL #2   Title Improvement in standardized balance assessment by MCID or greater.    Baseline 07/19/2023= BERG- 35/56; 08/22/23: 44   Time 8    Period Weeks    Status Achieved     Target Date 09/09/23      PT LONG TERM GOAL #3   Title Pt to demonstrate 5xSTS hands free from standard height surface to indicated improved BLE power.    Baseline 07/19/2023= 31.07 sec without UE Support; 08/22/23: 19.15sec (hands on thighs); 09/08/2023= 16.42 sec; 12/01/2023= 14.42 sec without UE support    Time 8    Period Weeks    Status MET   Target Date 09/09/23      PT LONG TERM GOAL #4   Title Pt to demonstrate performance of 2022ft AMB >0.10m/s without increased pain and without LOB at modI level or less to restore ability to access comunity distances for IADL.    Baseline 08/22/23: 681ft in 4:02 (0.41m/s); 08/25/2023= 1200 in . 9/12= 0.62 m/s; 12/01/2023= will reassess next visit   Time 8    Period Weeks    Status Ongoing    Target Date 02/23/2024        PT LONG TERM GOAL #5  Title Pt will improve BERG by at least 3 points in order to demonstrate clinically significant improvement in balance.   Baseline 09/08/2023= 46/56; 12/01/2023= 47/56  Time 12  Period Weeks   Status PROGRESSING  Target Date 02/23/2024      PT LONG TERM GOAL #6  Title  Pt will decrease 5TSTS by at least 3 seconds in order to demonstrate clinically significant improvement in LE strength.  Baseline 09/08/2023= 16.42 sec 12/01/2023= 14.42 sec without UE support   Time 12  Period Weeks   Status  PROGRESSING  Target Date 02/23/2024        Plan -     Clinical Impression Statement Patient continues to  demonstrate overall improved dynamic standing balance- improved duration with tandem standing and dual task today using mostly ankle righting reactions. She continues to move well and walked some without cane- slower gait speed but no LOB today. Pt will benefit from continued PT services to address and improve her functional strength and mobility to assist in return to previous level of function.    Personal Factors and Comorbidities Age;Fitness;Behavior Pattern;Past/Current Experience    Examination-Activity Limitations Locomotion Level;Transfers;Lift;Stairs    Examination-Participation Restrictions Meal Prep;Cleaning;Community Activity;Driving;Interpersonal Relationship;Yard Work    Stability/Clinical Decision Making Stable/Uncomplicated    Clinical Decision Making Moderate    Rehab Potential Good    PT Frequency 1-2x / week    PT Duration 12 weeks    PT Treatment/Interventions ADLs/Self Care Home Management;Gait training;Stair training;Functional mobility training;Neuromuscular re-education;Balance training;Therapeutic exercise;Therapeutic activities;Passive range of motion;Manual techniques    PT Next Visit Plan Continue with balance and therex  for improved strength and balance         PT Home Exercise Plan asked at eval to check and log BP twice daily,   Access Code: [email or web address] URL: [email or web address] Date: 07/21/2023 Prepared by: Grier Rocher  Exercises - Sit to Stand with Armchair  - 1 x daily - 5 x weekly - 2 sets - 6 reps - Standing Hip Abduction with Counter Support  - 1 x daily - 7 x weekly - 3 sets - 10 reps - Seated Hip Abduction with Resistance  - 1 x daily - 7 x weekly - 3 sets - 10 reps - Seated March with  Resistance  - 1 x daily - 7 x weekly - 3 sets - 10 reps   Consulted and Agree with Plan of Care Patient;Family member/caregiver             Patient will benefit from skilled therapeutic intervention in order to improve the following deficits and  impairments:   balance impairments, endurance deficits, reduced strength,   Visit Diagnosis: Repeated falls  Difficulty in walking, not elsewhere classified  Muscle weakness (generalized)  Unsteadiness on feet     Problem List Patient Active Problem List   Diagnosis Date Noted   Obstructive nephropathy 01/13/2023   Acute pyelonephritis 01/13/2023   AKI (acute kidney injury) (HCC) 01/13/2023   Hypokalemia 01/13/2023   Diarrhea 01/13/2023   Thoracic aortic aneurysm without rupture (HCC) 11/09/2022   Hematuria 11/05/2022   Polyp of esophagus 11/02/2022   Iron deficiency anemia 07/28/2022   Positive colorectal cancer screening using Cologuard test    Abnormal ECG 03/31/2021   Acute non-recurrent sinusitis 11/30/2020   Acute otitis media 11/30/2020   Benign essential HTN 10/31/2018   Frequent PVCs 09/08/2017   A-fib (HCC) 08/27/2015   Clinical depression 08/27/2015   Blood pressure elevated 08/27/2015   Polypharmacy 08/27/2015   Esophagitis, reflux 08/27/2015   Fatigue 08/27/2015   Acid reflux 08/27/2015   HLD (hyperlipidemia) 08/27/2015   Adult hypothyroidism 08/27/2015   Adaptive colitis 08/27/2015   Malaise and fatigue 08/27/2015   Mild major depression (HCC) 08/27/2015   Polymyalgia rheumatica (HCC) 08/27/2015   Cranial arteritis (HCC) 08/27/2015   Avitaminosis D 08/27/2015      Louis Meckel, PT Physical Therapist - [place]  [facility]  2:11 PM 12/08/23

## 2023-12-15 ENCOUNTER — Ambulatory Visit: Payer: Medicare Other

## 2023-12-16 ENCOUNTER — Ambulatory Visit: Payer: Medicare Other

## 2023-12-16 DIAGNOSIS — R262 Difficulty in walking, not elsewhere classified: Secondary | ICD-10-CM

## 2023-12-16 DIAGNOSIS — R2681 Unsteadiness on feet: Secondary | ICD-10-CM

## 2023-12-16 DIAGNOSIS — R296 Repeated falls: Secondary | ICD-10-CM | POA: Diagnosis not present

## 2023-12-16 DIAGNOSIS — M6281 Muscle weakness (generalized): Secondary | ICD-10-CM

## 2023-12-16 NOTE — Therapy (Signed)
[facility] Outpatient Rehabilitation at [facility] [address] [place], [place], [address] Phone: [phone]   Fax:  [phone]  Outpatient Physical Therapy Treatment   Patient Details  Name: Tara Marquez MRN: [id number] Date of Birth: Nov 06, 1941 Referring Provider (PT): Julieanne Manson MD (PCP)   Encounter Date: 12/16/2023   PT End of Session - 12/16/23 0911     Visit Number 32    Number of Visits 54    Date for PT Re-Evaluation 02/23/24    Authorization Type Medicare A&B primary with BCBS supplemental secondary    Authorization Time Period 07/14/23-09/08/23    Progress Note Due on Visit 30    PT Start Time 0845    PT Stop Time 0929    PT Time Calculation (min) 44 min    Equipment Utilized During Treatment Gait belt    Activity Tolerance Patient tolerated treatment well    Behavior During Therapy WFL for tasks assessed/performed                       Past Medical History:  Diagnosis Date   Acute pyelonephritis    Adaptive colitis    Anemia    Aortic atherosclerosis (HCC)    Atrial fibrillation and flutter (HCC)    a.) CHA2DS2VASc = 5 (age x 2, sex, HTN, vascular disease history);  b.) rate/rhythm maintained on oral amiodarone + metoprolol succinate; chronically anticoagulated with dose reduced apixaban   CAD (coronary artery disease)    Depression    Descending thoracic aortic aneurysm (HCC)    a.) CT chest 09/29/2022: 4.9 cm; b.) CT abd 01/13/2023: 4.9 cm   Frequent PVCs    GERD (gastroesophageal reflux disease)    Hepatic steatosis    History of 2019 novel coronavirus disease (COVID-19) 12/04/2021   Hyperlipidemia    Hypertension    Hypokalemia    Hypothyroidism    Long term current use of amiodarone    Long term current use of anticoagulant    a.) dose reduced apixaban   Nephrolithiasis    Osteopenia    Polymyalgia rheumatica (HCC)    Skin cancer    Splenomegaly    Temporal arteritis (HCC)    Vitamin D deficiency      Past Surgical History:  Procedure Laterality Date   ABDOMINAL HYSTERECTOMY  03/1971   Status Post Hysterectomy   CATARACT EXTRACTION Left    COLONOSCOPY     2010   COLONOSCOPY WITH PROPOFOL N/A 01/12/2022   Procedure: COLONOSCOPY WITH PROPOFOL;  Surgeon: Midge Minium, MD;  Location: [facility] ENDOSCOPY;  Service: Endoscopy;  Laterality: N/A;   CYSTOSCOPY WITH RETROGRADE PYELOGRAM, URETEROSCOPY AND STENT PLACEMENT Left 01/13/2023   Procedure: CYSTOSCOPY WITH RETROGRADE PYELOGRAM, URETEROSCOPY AND STENT PLACEMENT;  Surgeon: Riki Altes, MD;  Location: [facility] ORS;  Service: Urology;  Laterality: Left;   CYSTOSCOPY/URETEROSCOPY/HOLMIUM LASER/STENT PLACEMENT Left 02/08/2023   Procedure: CYSTOSCOPY/URETEROSCOPY/HOLMIUM LASER/STENT EXCHANGE;  Surgeon: Riki Altes, MD;  Location: [facility] ORS;  Service: Urology;  Laterality: Left;   ESOPHAGOGASTRODUODENOSCOPY N/A 11/02/2022   Procedure: ESOPHAGOGASTRODUODENOSCOPY (EGD);  Surgeon: Midge Minium, MD;  Location: [facility] ENDOSCOPY;  Service: Endoscopy;  Laterality: N/A;   EYE SURGERY     GIVENS CAPSULE STUDY N/A 12/22/2022   Procedure: GIVENS CAPSULE STUDY;  Surgeon: Midge Minium, MD;  Location: [facility] ENDOSCOPY;  Service: Endoscopy;  Laterality: N/A;   TONSILLECTOMY     TONSILLECTOMY AND ADENOIDECTOMY  1951   WISDOM TOOTH EXTRACTION  1970  Subjective Assessment     Subjective Patient reports some fatigue from getting ready for family to come over for Christmas.     Pertinent History Tara Marquez is an 82yoF who is referred by PCP due to gradual progressive weakness since hospital admission in February 2024. Pt presents to PT eval with husband. Pt reports being in hospital for ~ a week in Feb 2024 with urinary obstruction, AKI, felt week initially but declined HHPT; pt/husband say progressively more weak over subsequent months, more difficulty with walking, husband has now asked her to use a cane. Pt reports 5 falls, 4 of which were outside on  natural surface 5th trying to clear a threshold (posterior fall buttock, back, hit head). Does get dizzy, but not frequently, not related to falls often.    How long can you sit comfortably? Not a limitation    How long can you stand comfortably? Not a limitation    How long can you walk comfortably? Mostly walks household distances, but can tolerate occasional store trips with buggy support.    Currently in Pain? No/denies               Objective measurements completed on examination: See above findings.    TODAY'S TREATMENT: 12/16/23  NMR:  Dynamic LE marching in place on airex pad x 20 reps (improved step height with practice)  Lateral step up/over  1/2 foam roll x 12 reps each direction.  Dynamic step tap onto 6" block without UE support x 20 reps each LE Forward step up onto 6 " block without UE support x 12 reps each LE Static stand on airex pad with 1 LE  and the other onto 6" block positioned directly in front of pad x 30 sec then switch legs and repeat- 3 side x 3 trials Static stand on airex pad-1 LE  and the other onto 6" block positioned directly in front of pad with horizontal head turns x 10 reps x 2 sets  THEREX:   Sit to stand  2 sets x 10 without UE support         PT Education -     Education Details Exercise technique- VC for safety    Safety in management of unlevel sidewalk to imrprove step height                      PT Short Term Goals -        PT SHORT TERM GOAL #1   Title compliant with balance HEP    Time 4    Period Weeks    Status Achieved    Target Date 08/12/23      PT SHORT TERM GOAL #2   Title Improved overground gait speed over 55ft to improve ability to access limited community distances safely.    Baseline eval: 367ft at 0.57m/s; 662ft s device 0.54m/s.   Time 4    Period Weeks    Status Achieved     Target Date 08/12/23               PT Long Term Goals -       PT LONG TERM GOAL #1   Title FOTO score  improvement >12 points to indicate improve confidence in balance.    Baseline 07/19/2023=43; 08/22/23: 51; 09/08/2023 =will assess next visit; 10/22= 52; 12/01/2023= 57   Time 12   Period Weeks    Status MET   Target Date 12/01/2023  PT LONG TERM GOAL #2   Title Improvement in standardized balance assessment by MCID or greater.    Baseline 07/19/2023= BERG- 35/56; 08/22/23: 44   Time 8    Period Weeks    Status Achieved     Target Date 09/09/23      PT LONG TERM GOAL #3   Title Pt to demonstrate 5xSTS hands free from standard height surface to indicated improved BLE power.    Baseline 07/19/2023= 31.07 sec without UE Support; 08/22/23: 19.15sec (hands on thighs); 09/08/2023= 16.42 sec; 12/01/2023= 14.42 sec without UE support    Time 8    Period Weeks    Status MET   Target Date 09/09/23      PT LONG TERM GOAL #4   Title Pt to demonstrate performance of 2064ft AMB >0.66m/s without increased pain and without LOB at modI level or less to restore ability to access comunity distances for IADL.    Baseline 08/22/23: 680ft in 4:02 (0.60m/s); 08/25/2023= 1200 in . 9/12= 0.62 m/s; 12/01/2023= will reassess next visit   Time 8    Period Weeks    Status Ongoing    Target Date 02/23/2024        PT LONG TERM GOAL #5  Title Pt will improve BERG by at least 3 points in order to demonstrate clinically significant improvement in balance.   Baseline 09/08/2023= 46/56; 12/01/2023= 47/56  Time 12  Period Weeks   Status PROGRESSING  Target Date 02/23/2024      PT LONG TERM GOAL #6  Title  Pt will decrease 5TSTS by at least 3 seconds in order to demonstrate clinically significant improvement in LE strength.  Baseline 09/08/2023= 16.42 sec 12/01/2023= 14.42 sec without UE support   Time 12  Period Weeks   Status  PROGRESSING  Target Date 02/23/2024        Plan -     Clinical Impression Statement Treatment continues to focus on dynamic standing balance- Patient initially intermittently reaches  for UE support on first few reps yet able to demo some in session progress and less to no UE support. She is demonstrating improved LE strength as seen by 2 set of 10 reps with sit to stand without using her arms and when she first came she struggled to stand even with 2 UE support.  Pt will benefit from continued PT services to address and improve her functional strength and mobility to assist in return to previous level of function.    Personal Factors and Comorbidities Age;Fitness;Behavior Pattern;Past/Current Experience    Examination-Activity Limitations Locomotion Level;Transfers;Lift;Stairs    Examination-Participation Restrictions Meal Prep;Cleaning;Community Activity;Driving;Interpersonal Relationship;Yard Work    Stability/Clinical Decision Making Stable/Uncomplicated    Clinical Decision Making Moderate    Rehab Potential Good    PT Frequency 1-2x / week    PT Duration 12 weeks    PT Treatment/Interventions ADLs/Self Care Home Management;Gait training;Stair training;Functional mobility training;Neuromuscular re-education;Balance training;Therapeutic exercise;Therapeutic activities;Passive range of motion;Manual techniques    PT Next Visit Plan Continue with balance and therex  for improved strength and balance         PT Home Exercise Plan asked at eval to check and log BP twice daily,   Access Code: [email or web address] URL: [email or web address] Date: 07/21/2023 Prepared by: Grier Rocher  Exercises - Sit to Stand with Armchair  - 1 x daily - 5 x weekly - 2 sets - 6 reps - Standing Hip Abduction with Counter Support  - 1 x daily - 7 x weekly -  3 sets - 10 reps - Seated Hip Abduction with Resistance  - 1 x daily - 7 x weekly - 3 sets - 10 reps - Seated March with Resistance  - 1 x daily - 7 x weekly - 3 sets - 10 reps   Consulted and Agree with Plan of Care Patient;Family member/caregiver             Patient will benefit from skilled therapeutic intervention in order  to improve the following deficits and impairments:   balance impairments, endurance deficits, reduced strength,   Visit Diagnosis: Repeated falls  Difficulty in walking, not elsewhere classified  Muscle weakness (generalized)  Unsteadiness on feet     Problem List Patient Active Problem List   Diagnosis Date Noted   Obstructive nephropathy 01/13/2023   Acute pyelonephritis 01/13/2023   AKI (acute kidney injury) (HCC) 01/13/2023   Hypokalemia 01/13/2023   Diarrhea 01/13/2023   Thoracic aortic aneurysm without rupture (HCC) 11/09/2022   Hematuria 11/05/2022   Polyp of esophagus 11/02/2022   Iron deficiency anemia 07/28/2022   Positive colorectal cancer screening using Cologuard test    Abnormal ECG 03/31/2021   Acute non-recurrent sinusitis 11/30/2020   Acute otitis media 11/30/2020   Benign essential HTN 10/31/2018   Frequent PVCs 09/08/2017   A-fib (HCC) 08/27/2015   Clinical depression 08/27/2015   Blood pressure elevated 08/27/2015   Polypharmacy 08/27/2015   Esophagitis, reflux 08/27/2015   Fatigue 08/27/2015   Acid reflux 08/27/2015   HLD (hyperlipidemia) 08/27/2015   Adult hypothyroidism 08/27/2015   Adaptive colitis 08/27/2015   Malaise and fatigue 08/27/2015   Mild major depression (HCC) 08/27/2015   Polymyalgia rheumatica (HCC) 08/27/2015   Cranial arteritis (HCC) 08/27/2015   Avitaminosis D 08/27/2015      Louis Meckel, PT Physical Therapist - [place]  [facility]  9:46 AM 12/16/23

## 2023-12-20 ENCOUNTER — Ambulatory Visit: Payer: Medicare Other

## 2023-12-22 ENCOUNTER — Ambulatory Visit: Payer: Medicare Other

## 2023-12-27 ENCOUNTER — Ambulatory Visit: Payer: Medicare Other

## 2023-12-29 NOTE — Therapy (Signed)
 [facility] Outpatient Rehabilitation at [facility] [address] [place], [place], [address] Phone: [phone]   Fax:  [phone]  Outpatient Physical Therapy Treatment   Patient Details  Name: Tara Marquez MRN: [id number] Date of Birth: 1941/06/07 Referring Provider (PT): Charlie Forte MD (PCP)   Encounter Date: 12/30/2023   PT End of Session - 12/30/23 0851     Visit Number 33    Number of Visits 54    Date for PT Re-Evaluation 02/23/24    Authorization Type Medicare A&B primary with BCBS supplemental secondary    Authorization Time Period 07/14/23-09/08/23    Progress Note Due on Visit 30    PT Start Time 0847    PT Stop Time 0930    PT Time Calculation (min) 43 min    Equipment Utilized During Treatment Gait belt    Activity Tolerance Patient tolerated treatment well    Behavior During Therapy WFL for tasks assessed/performed                        Past Medical History:  Diagnosis Date   Acute pyelonephritis    Adaptive colitis    Anemia    Aortic atherosclerosis (HCC)    Atrial fibrillation and flutter (HCC)    a.) CHA2DS2VASc = 5 (age x 2, sex, HTN, vascular disease history);  b.) rate/rhythm maintained on oral amiodarone  + metoprolol  succinate; chronically anticoagulated with dose reduced apixaban    CAD (coronary artery disease)    Depression    Descending thoracic aortic aneurysm (HCC)    a.) CT chest 09/29/2022: 4.9 cm; b.) CT abd 01/13/2023: 4.9 cm   Frequent PVCs    GERD (gastroesophageal reflux disease)    Hepatic steatosis    History of 2019 novel coronavirus disease (COVID-19) 12/04/2021   Hyperlipidemia    Hypertension    Hypokalemia    Hypothyroidism    Long term current use of amiodarone     Long term current use of anticoagulant    a.) dose reduced apixaban    Nephrolithiasis    Osteopenia    Polymyalgia rheumatica (HCC)    Skin cancer    Splenomegaly    Temporal arteritis (HCC)    Vitamin D  deficiency      Past Surgical History:  Procedure Laterality Date   ABDOMINAL HYSTERECTOMY  03/1971   Status Post Hysterectomy   CATARACT EXTRACTION Left    COLONOSCOPY     2010   COLONOSCOPY WITH PROPOFOL  N/A 01/12/2022   Procedure: COLONOSCOPY WITH PROPOFOL ;  Surgeon: Jinny Carmine, MD;  Location: [facility] ENDOSCOPY;  Service: Endoscopy;  Laterality: N/A;   CYSTOSCOPY WITH RETROGRADE PYELOGRAM, URETEROSCOPY AND STENT PLACEMENT Left 01/13/2023   Procedure: CYSTOSCOPY WITH RETROGRADE PYELOGRAM, URETEROSCOPY AND STENT PLACEMENT;  Surgeon: Twylla Glendia BROCKS, MD;  Location: [facility] ORS;  Service: Urology;  Laterality: Left;   CYSTOSCOPY/URETEROSCOPY/HOLMIUM LASER/STENT PLACEMENT Left 02/08/2023   Procedure: CYSTOSCOPY/URETEROSCOPY/HOLMIUM LASER/STENT EXCHANGE;  Surgeon: Twylla Glendia BROCKS, MD;  Location: [facility] ORS;  Service: Urology;  Laterality: Left;   ESOPHAGOGASTRODUODENOSCOPY N/A 11/02/2022   Procedure: ESOPHAGOGASTRODUODENOSCOPY (EGD);  Surgeon: Jinny Carmine, MD;  Location: [facility] ENDOSCOPY;  Service: Endoscopy;  Laterality: N/A;   EYE SURGERY     GIVENS CAPSULE STUDY N/A 12/22/2022   Procedure: GIVENS CAPSULE STUDY;  Surgeon: Jinny Carmine, MD;  Location: [facility] ENDOSCOPY;  Service: Endoscopy;  Laterality: N/A;   TONSILLECTOMY     TONSILLECTOMY AND ADENOIDECTOMY  1951   WISDOM TOOTH EXTRACTION  1970  Subjective Assessment     Subjective I feel like I am taking some steps backward. I feel like my lightheadedness.  She denies any pain or falls     Pertinent History Tara Marquez is an 82yoF who is referred by PCP due to gradual progressive weakness since hospital admission in February 2024. Pt presents to PT eval with husband. Pt reports being in hospital for ~ a week in Feb 2024 with urinary obstruction, AKI, felt week initially but declined HHPT; pt/husband say progressively more weak over subsequent months, more difficulty with walking, husband has now asked her to use a cane. Pt reports 5 falls, 4 of which  were outside on natural surface 5th trying to clear a threshold (posterior fall buttock, back, hit head). Does get dizzy, but not frequently, not related to falls often.    How long can you sit comfortably? Not a limitation    How long can you stand comfortably? Not a limitation    How long can you walk comfortably? Mostly walks household distances, but can tolerate occasional store trips with buggy support.    Currently in Pain? No/denies               Objective measurements completed on examination: See above findings.    TODAY'S TREATMENT: 12/30/23  NMR:  Dynamic step tap onto 1/2 foam without UE support x 20 reps each LE Static stand on 1/2 foam roll without UE support x 20 sec x 3 (Difficulty with intermittent UE touch for balance)  Lateral step up/over  1/2 foam roll x 12 reps each direction.  Dynamic LE marching in place on airex pad x 20 reps (improved step height with practice)  Static tandem stand on airex pads x 20 sec then switch legs and repeat- 3 side x 3 trials SLS attempts x 1-3 sec x multiple attempts (very difficult)   THEREX:  Standing calf raises from 1/2 foam roll- 2 x 12 reps Sit to stand  2 sets x 10 without UE support Forward lunges x 10 reps alt LE Reverse lunges x 10 reps alt LE         PT Education -     Education Details Exercise technique- VC for safety    Safety in management of unlevel sidewalk to imrprove step height                      PT Short Term Goals -        PT SHORT TERM GOAL #1   Title compliant with balance HEP    Time 4    Period Weeks    Status Achieved    Target Date 08/12/23      PT SHORT TERM GOAL #2   Title Improved overground gait speed over 521ft to improve ability to access limited community distances safely.    Baseline eval: 381ft at 0.69m/s; 6100ft s device 0.1m/s.   Time 4    Period Weeks    Status Achieved     Target Date 08/12/23               PT Long Term Goals -       PT LONG  TERM GOAL #1   Title FOTO score improvement >12 points to indicate improve confidence in balance.    Baseline 07/19/2023=43; 08/22/23: 51; 09/08/2023 =will assess next visit; 10/22= 52; 12/01/2023= 57   Time 12   Period Weeks    Status MET   Target Date  12/01/2023     PT LONG TERM GOAL #2   Title Improvement in standardized balance assessment by MCID or greater.    Baseline 07/19/2023= BERG- 35/56; 08/22/23: 44   Time 8    Period Weeks    Status Achieved     Target Date 09/09/23      PT LONG TERM GOAL #3   Title Pt to demonstrate 5xSTS hands free from standard height surface to indicated improved BLE power.    Baseline 07/19/2023= 31.07 sec without UE Support; 08/22/23: 19.15sec (hands on thighs); 09/08/2023= 16.42 sec; 12/01/2023= 14.42 sec without UE support    Time 8    Period Weeks    Status MET   Target Date 09/09/23      PT LONG TERM GOAL #4   Title Pt to demonstrate performance of 2051ft AMB >0.2m/s without increased pain and without LOB at modI level or less to restore ability to access comunity distances for IADL.    Baseline 08/22/23: 628ft in 4:02 (0.14m/s); 08/25/2023= 1200 in . 9/12= 0.62 m/s; 12/01/2023= will reassess next visit   Time 8    Period Weeks    Status Ongoing    Target Date 02/23/2024        PT LONG TERM GOAL #5  Title Pt will improve BERG by at least 3 points in order to demonstrate clinically significant improvement in balance.   Baseline 09/08/2023= 46/56; 12/01/2023= 47/56  Time 12  Period Weeks   Status PROGRESSING  Target Date 02/23/2024      PT LONG TERM GOAL #6  Title  Pt will decrease 5TSTS by at least 3 seconds in order to demonstrate clinically significant improvement in LE strength.  Baseline 09/08/2023= 16.42 sec 12/01/2023= 14.42 sec without UE support   Time 12  Period Weeks   Status  PROGRESSING  Target Date 02/23/2024        Plan -     Clinical Impression Statement Patient arrived and presented with good motivation throughout  session today. She struggled more with dynamic balance - unable to perform without some UE support including SLS and dynamic marching but was able to hold tandem on airex pad better with practice and no UE support. Despite not being in PT for 2 weeks and self reported feeling weak she was still able to perform 2 sets of 10 reps with sit to stand and other various strengthening exercises without significant difficulty.  Pt will benefit from continued PT services to address and improve her functional strength and mobility to assist in return to previous level of function.    Personal Factors and Comorbidities Age;Fitness;Behavior Pattern;Past/Current Experience    Examination-Activity Limitations Locomotion Level;Transfers;Lift;Stairs    Examination-Participation Restrictions Meal Prep;Cleaning;Community Activity;Driving;Interpersonal Relationship;Yard Work    Stability/Clinical Decision Making Stable/Uncomplicated    Clinical Decision Making Moderate    Rehab Potential Good    PT Frequency 1-2x / week    PT Duration 12 weeks    PT Treatment/Interventions ADLs/Self Care Home Management;Gait training;Stair training;Functional mobility training;Neuromuscular re-education;Balance training;Therapeutic exercise;Therapeutic activities;Passive range of motion;Manual techniques    PT Next Visit Plan Continue with balance and therex  for improved strength and balance         PT Home Exercise Plan asked at eval to check and log BP twice daily,   Access Code: [email or web address] URL: [email or web address] Date: 07/21/2023 Prepared by: Massie Dollar  Exercises - Sit to Stand with Armchair  - 1 x daily - 5 x weekly - 2 sets - 6  reps - Standing Hip Abduction with Counter Support  - 1 x daily - 7 x weekly - 3 sets - 10 reps - Seated Hip Abduction with Resistance  - 1 x daily - 7 x weekly - 3 sets - 10 reps - Seated March with Resistance  - 1 x daily - 7 x weekly - 3 sets - 10 reps   Consulted and Agree  with Plan of Care Patient;Family member/caregiver             Patient will benefit from skilled therapeutic intervention in order to improve the following deficits and impairments:   balance impairments, endurance deficits, reduced strength,   Visit Diagnosis: Difficulty in walking, not elsewhere classified  Muscle weakness (generalized)  Unsteadiness on feet     Problem List Patient Active Problem List   Diagnosis Date Noted   Obstructive nephropathy 01/13/2023   Acute pyelonephritis 01/13/2023   AKI (acute kidney injury) (HCC) 01/13/2023   Hypokalemia 01/13/2023   Diarrhea 01/13/2023   Thoracic aortic aneurysm without rupture (HCC) 11/09/2022   Hematuria 11/05/2022   Polyp of esophagus 11/02/2022   Iron deficiency anemia 07/28/2022   Positive colorectal cancer screening using Cologuard test    Abnormal ECG 03/31/2021   Acute non-recurrent sinusitis 11/30/2020   Acute otitis media 11/30/2020   Benign essential HTN 10/31/2018   Frequent PVCs 09/08/2017   A-fib (HCC) 08/27/2015   Clinical depression 08/27/2015   Blood pressure elevated 08/27/2015   Polypharmacy 08/27/2015   Esophagitis, reflux 08/27/2015   Fatigue 08/27/2015   Acid reflux 08/27/2015   HLD (hyperlipidemia) 08/27/2015   Adult hypothyroidism 08/27/2015   Adaptive colitis 08/27/2015   Malaise and fatigue 08/27/2015   Mild major depression (HCC) 08/27/2015   Polymyalgia rheumatica (HCC) 08/27/2015   Cranial arteritis (HCC) 08/27/2015   Avitaminosis D 08/27/2015      Chyrl London, PT Physical Therapist - [place]  [facility]  9:42 AM 12/30/23

## 2023-12-30 ENCOUNTER — Ambulatory Visit: Payer: Medicare Other | Attending: Family Medicine

## 2023-12-30 DIAGNOSIS — R2681 Unsteadiness on feet: Secondary | ICD-10-CM | POA: Diagnosis present

## 2023-12-30 DIAGNOSIS — M6281 Muscle weakness (generalized): Secondary | ICD-10-CM | POA: Insufficient documentation

## 2023-12-30 DIAGNOSIS — R296 Repeated falls: Secondary | ICD-10-CM | POA: Diagnosis present

## 2023-12-30 DIAGNOSIS — R262 Difficulty in walking, not elsewhere classified: Secondary | ICD-10-CM | POA: Insufficient documentation

## 2024-01-03 ENCOUNTER — Ambulatory Visit: Payer: Medicare Other | Admitting: Physical Therapy

## 2024-01-06 ENCOUNTER — Ambulatory Visit: Payer: Medicare Other

## 2024-01-06 DIAGNOSIS — R2681 Unsteadiness on feet: Secondary | ICD-10-CM

## 2024-01-06 DIAGNOSIS — R262 Difficulty in walking, not elsewhere classified: Secondary | ICD-10-CM

## 2024-01-06 DIAGNOSIS — M6281 Muscle weakness (generalized): Secondary | ICD-10-CM

## 2024-01-06 DIAGNOSIS — R296 Repeated falls: Secondary | ICD-10-CM

## 2024-01-06 NOTE — Therapy (Signed)
 [facility] Rehabilitation at [facility] [address] [place], [place], [address] Phone: [phone]   Fax:  [phone]  Outpatient Physical Therapy Treatment   Patient Details  Name: Tara Marquez MRN: [id number] Date of Birth: [birth year] Referring Provider (PT): Charlie Forte MD (PCP)   Encounter Date: 01/06/2024   PT End of Session - 01/06/24 0902     Visit Number 34    Number of Visits 54    Date for PT Re-Evaluation 02/23/24    Authorization Type Medicare A&B primary with BCBS supplemental secondary    Authorization Time Period 07/14/23-09/08/23    Progress Note Due on Visit 30    PT Start Time 0845    PT Stop Time 0927    PT Time Calculation (min) 42 min    Equipment Utilized During Treatment Gait belt    Activity Tolerance Patient tolerated treatment well    Behavior During Therapy WFL for tasks assessed/performed                         Past Medical History:  Diagnosis Date   Acute pyelonephritis    Adaptive colitis    Anemia    Aortic atherosclerosis (HCC)    Atrial fibrillation and flutter (HCC)    a.) CHA2DS2VASc = 5 (age x 2, sex, HTN, vascular disease history);  b.) rate/rhythm maintained on oral amiodarone  + metoprolol  succinate; chronically anticoagulated with dose reduced apixaban    CAD (coronary artery disease)    Depression    Descending thoracic aortic aneurysm (HCC)    a.) CT chest 09/29/2022: 4.9 cm; b.) CT abd 01/13/2023: 4.9 cm   Frequent PVCs    GERD (gastroesophageal reflux disease)    Hepatic steatosis    History of 2019 novel coronavirus disease (COVID-19) 12/04/2021   Hyperlipidemia    Hypertension    Hypokalemia    Hypothyroidism    Long term current use of amiodarone     Long term current use of anticoagulant    a.) dose reduced apixaban    Nephrolithiasis    Osteopenia    Polymyalgia rheumatica (HCC)    Skin cancer    Splenomegaly    Temporal arteritis (HCC)    Vitamin D   deficiency     Past Surgical History:  Procedure Laterality Date   ABDOMINAL HYSTERECTOMY  03/1971   Status Post Hysterectomy   CATARACT EXTRACTION Left    COLONOSCOPY     2010   COLONOSCOPY WITH PROPOFOL  N/A 01/12/2022   Procedure: COLONOSCOPY WITH PROPOFOL ;  Surgeon: Jinny Carmine, MD;  Location: [facility] ENDOSCOPY;  Service: Endoscopy;  Laterality: N/A;   CYSTOSCOPY WITH RETROGRADE PYELOGRAM, URETEROSCOPY AND STENT PLACEMENT Left 01/13/2023   Procedure: CYSTOSCOPY WITH RETROGRADE PYELOGRAM, URETEROSCOPY AND STENT PLACEMENT;  Surgeon: Twylla Glendia BROCKS, MD;  Location: [facility] ORS;  Service: Urology;  Laterality: Left;   CYSTOSCOPY/URETEROSCOPY/HOLMIUM LASER/STENT PLACEMENT Left 02/08/2023   Procedure: CYSTOSCOPY/URETEROSCOPY/HOLMIUM LASER/STENT EXCHANGE;  Surgeon: Twylla Glendia BROCKS, MD;  Location: [facility] ORS;  Service: Urology;  Laterality: Left;   ESOPHAGOGASTRODUODENOSCOPY N/A 11/02/2022   Procedure: ESOPHAGOGASTRODUODENOSCOPY (EGD);  Surgeon: Jinny Carmine, MD;  Location: [facility] ENDOSCOPY;  Service: Endoscopy;  Laterality: N/A;   EYE SURGERY     GIVENS CAPSULE STUDY N/A 12/22/2022   Procedure: GIVENS CAPSULE STUDY;  Surgeon: Jinny Carmine, MD;  Location: [facility] ENDOSCOPY;  Service: Endoscopy;  Laterality: N/A;   TONSILLECTOMY     TONSILLECTOMY AND ADENOIDECTOMY  1951   WISDOM TOOTH EXTRACTION  1970  Subjective Assessment     Subjective Patient reports feeling pretty good today and denies any complaints. States only using cane for community outings.     Pertinent History [name] is an 82yoF who is referred by PCP due to gradual progressive weakness since hospital admission in February 2024. Pt presents to PT eval with husband. Pt reports being in hospital for ~ a week in Feb 2024 with urinary obstruction, AKI, felt week initially but declined HHPT; pt/husband say progressively more weak over subsequent months, more difficulty with walking, husband has now asked her to use a cane. Pt reports 5  falls, 4 of which were outside on natural surface 5th trying to clear a threshold (posterior fall buttock, back, hit head). Does get dizzy, but not frequently, not related to falls often.    How long can you sit comfortably? Not a limitation    How long can you stand comfortably? Not a limitation    How long can you walk comfortably? Mostly walks household distances, but can tolerate occasional store trips with buggy support.    Currently in Pain? No/denies               Objective measurements completed on examination: See above findings.    TODAY'S TREATMENT: 01/06/24  NMR:   High knee march walk in // bars down and retro (large steps) back- down an and back x 8. (VC for high knees)  and use of 2.5#  AW Dynamic step tap onto 6 block without UE support x 20 reps each LE using 2.5 # AW Static tandem stand on airex pads x 20 sec then switch legs and repeat- 3 side x 3 trials SLS attempts x 1-3 sec x multiple attempts (very difficult) Resistive gait 2.5# 330 feet - with no AD    THEREX:  Standing calf raises from 1/2 foam roll- 2 x 12 reps Sit to stand  x 15 reps without UE support Forward step up 2 sets  x 10 reps alt LE with 2.5# AW Reverse lunges x 10 reps alt LE.          PT Education -     Education Details Exercise technique- VC for safety    Safety in management of unlevel sidewalk to imrprove step height                      PT Short Term Goals -        PT SHORT TERM GOAL #1   Title compliant with balance HEP    Time 4    Period Weeks    Status Achieved    Target Date 08/12/23      PT SHORT TERM GOAL #2   Title Improved overground gait speed over 587ft to improve ability to access limited community distances safely.    Baseline eval: 376ft at 0.21m/s; 68ft s device 0.37m/s.   Time 4    Period Weeks    Status Achieved     Target Date 08/12/23               PT Long Term Goals -       PT LONG TERM GOAL #1   Title FOTO score  improvement >12 points to indicate improve confidence in balance.    Baseline 07/19/2023=43; 08/22/23: 51; 09/08/2023 =will assess next visit; 10/22= 52; 12/01/2023= 57   Time 12   Period Weeks    Status MET   Target Date 12/01/2023  PT LONG TERM GOAL #2   Title Improvement in standardized balance assessment by MCID or greater.    Baseline 07/19/2023= BERG- 35/56; 08/22/23: 44   Time 8    Period Weeks    Status Achieved     Target Date 09/09/23      PT LONG TERM GOAL #3   Title Pt to demonstrate 5xSTS hands free from standard height surface to indicated improved BLE power.    Baseline 07/19/2023= 31.07 sec without UE Support; 08/22/23: 19.15sec (hands on thighs); 09/08/2023= 16.42 sec; 12/01/2023= 14.42 sec without UE support    Time 8    Period Weeks    Status MET   Target Date 09/09/23      PT LONG TERM GOAL #4   Title Pt to demonstrate performance of 2057ft AMB >0.19m/s without increased pain and without LOB at modI level or less to restore ability to access comunity distances for IADL.    Baseline 08/22/23: 621ft in 4:02 (0.37m/s); 08/25/2023= 1200 in . 9/12= 0.62 m/s; 12/01/2023= will reassess next visit   Time 8    Period Weeks    Status Ongoing    Target Date 02/23/2024        PT LONG TERM GOAL #5  Title Pt will improve BERG by at least 3 points in order to demonstrate clinically significant improvement in balance.   Baseline 09/08/2023= 46/56; 12/01/2023= 47/56  Time 12  Period Weeks   Status PROGRESSING  Target Date 02/23/2024      PT LONG TERM GOAL #6  Title  Pt will decrease 5TSTS by at least 3 seconds in order to demonstrate clinically significant improvement in LE strength.  Baseline 09/08/2023= 16.42 sec 12/01/2023= 14.42 sec without UE support   Time 12  Period Weeks   Status  PROGRESSING  Target Date 02/23/2024        Plan -     Clinical Impression Statement Treatment focused on continuation of plan of care focusing on LE strengthening and balance  activities. She presented with improvement with completion of all tasks with minimal rest breaks and much improved SLS ability today. She is progressing well and even able to perform 15 straight reps of STS without UE support when She could not perform 1 rep without UE Support when she first started with outpatient PT services.  Pt will benefit from continued PT services to address and improve her functional strength and mobility to assist in return to previous level of function.    Personal Factors and Comorbidities Age;Fitness;Behavior Pattern;Past/Current Experience    Examination-Activity Limitations Locomotion Level;Transfers;Lift;Stairs    Examination-Participation Restrictions Meal Prep;Cleaning;Community Activity;Driving;Interpersonal Relationship;Yard Work    Stability/Clinical Decision Making Stable/Uncomplicated    Clinical Decision Making Moderate    Rehab Potential Good    PT Frequency 1-2x / week    PT Duration 12 weeks    PT Treatment/Interventions ADLs/Self Care Home Management;Gait training;Stair training;Functional mobility training;Neuromuscular re-education;Balance training;Therapeutic exercise;Therapeutic activities;Passive range of motion;Manual techniques    PT Next Visit Plan Continue with balance and therex  for improved strength and balance         PT Home Exercise Plan asked at eval to check and log BP twice daily,   Access Code: [email or web address] URL: [email or web address] Date: 07/21/2023 Prepared by: Massie Dollar  Exercises - Sit to Stand with Armchair  - 1 x daily - 5 x weekly - 2 sets - 6 reps - Standing Hip Abduction with Counter Support  - 1 x daily - 7 x weekly -  3 sets - 10 reps - Seated Hip Abduction with Resistance  - 1 x daily - 7 x weekly - 3 sets - 10 reps - Seated March with Resistance  - 1 x daily - 7 x weekly - 3 sets - 10 reps   Consulted and Agree with Plan of Care Patient;Family member/caregiver             Patient will benefit  from skilled therapeutic intervention in order to improve the following deficits and impairments:   balance impairments, endurance deficits, reduced strength,   Visit Diagnosis: Difficulty in walking, not elsewhere classified  Muscle weakness (generalized)  Unsteadiness on feet  Repeated falls     Problem List Patient Active Problem List   Diagnosis Date Noted   Obstructive nephropathy 01/13/2023   Acute pyelonephritis 01/13/2023   AKI (acute kidney injury) (HCC) 01/13/2023   Hypokalemia 01/13/2023   Diarrhea 01/13/2023   Thoracic aortic aneurysm without rupture (HCC) 11/09/2022   Hematuria 11/05/2022   Polyp of esophagus 11/02/2022   Iron deficiency anemia 07/28/2022   Positive colorectal cancer screening using Cologuard test    Abnormal ECG 03/31/2021   Acute non-recurrent sinusitis 11/30/2020   Acute otitis media 11/30/2020   Benign essential HTN 10/31/2018   Frequent PVCs 09/08/2017   A-fib (HCC) 08/27/2015   Clinical depression 08/27/2015   Blood pressure elevated 08/27/2015   Polypharmacy 08/27/2015   Esophagitis, reflux 08/27/2015   Fatigue 08/27/2015   Acid reflux 08/27/2015   HLD (hyperlipidemia) 08/27/2015   Adult hypothyroidism 08/27/2015   Adaptive colitis 08/27/2015   Malaise and fatigue 08/27/2015   Mild major depression (HCC) 08/27/2015   Polymyalgia rheumatica (HCC) 08/27/2015   Cranial arteritis (HCC) 08/27/2015   Avitaminosis D 08/27/2015      Chyrl London, PT Physical Therapist - [facility]  9:40 AM 01/06/24

## 2024-01-09 ENCOUNTER — Ambulatory Visit: Payer: Medicare Other

## 2024-01-10 ENCOUNTER — Ambulatory Visit: Payer: Medicare Other

## 2024-01-10 DIAGNOSIS — R2681 Unsteadiness on feet: Secondary | ICD-10-CM

## 2024-01-10 DIAGNOSIS — R296 Repeated falls: Secondary | ICD-10-CM

## 2024-01-10 DIAGNOSIS — R262 Difficulty in walking, not elsewhere classified: Secondary | ICD-10-CM

## 2024-01-10 DIAGNOSIS — M6281 Muscle weakness (generalized): Secondary | ICD-10-CM

## 2024-01-10 NOTE — Therapy (Signed)
 [facility] Outpatient Rehabilitation at [facility] [address] [place], [place], [address] Phone: [phone]   Fax:  [phone]  Outpatient Physical Therapy Treatment   Patient Details  Name: Tara Marquez MRN: [id number] Date of Birth: 1941/10/15 Referring Provider (PT): Charlie Forte MD (PCP)   Encounter Date: 01/10/2024   PT End of Session - 01/10/24 1107     Visit Number 35    Number of Visits 54    Date for PT Re-Evaluation 02/23/24    Authorization Type Medicare A&B primary with BCBS supplemental secondary    Authorization Time Period 07/14/23-09/08/23    Progress Note Due on Visit 40    PT Start Time 1103    PT Stop Time 1143    PT Time Calculation (min) 40 min    Equipment Utilized During Treatment Gait belt    Activity Tolerance Patient tolerated treatment well    Behavior During Therapy WFL for tasks assessed/performed                         Past Medical History:  Diagnosis Date   Acute pyelonephritis    Adaptive colitis    Anemia    Aortic atherosclerosis (HCC)    Atrial fibrillation and flutter (HCC)    a.) CHA2DS2VASc = 5 (age x 2, sex, HTN, vascular disease history);  b.) rate/rhythm maintained on oral amiodarone  + metoprolol  succinate; chronically anticoagulated with dose reduced apixaban    CAD (coronary artery disease)    Depression    Descending thoracic aortic aneurysm (HCC)    a.) CT chest 09/29/2022: 4.9 cm; b.) CT abd 01/13/2023: 4.9 cm   Frequent PVCs    GERD (gastroesophageal reflux disease)    Hepatic steatosis    History of 2019 novel coronavirus disease (COVID-19) 12/04/2021   Hyperlipidemia    Hypertension    Hypokalemia    Hypothyroidism    Long term current use of amiodarone     Long term current use of anticoagulant    a.) dose reduced apixaban    Nephrolithiasis    Osteopenia    Polymyalgia rheumatica (HCC)    Skin cancer    Splenomegaly    Temporal arteritis (HCC)    Vitamin D   deficiency     Past Surgical History:  Procedure Laterality Date   ABDOMINAL HYSTERECTOMY  03/1971   Status Post Hysterectomy   CATARACT EXTRACTION Left    COLONOSCOPY     2010   COLONOSCOPY WITH PROPOFOL  N/A 01/12/2022   Procedure: COLONOSCOPY WITH PROPOFOL ;  Surgeon: Jinny Carmine, MD;  Location: [facility] ENDOSCOPY;  Service: Endoscopy;  Laterality: N/A;   CYSTOSCOPY WITH RETROGRADE PYELOGRAM, URETEROSCOPY AND STENT PLACEMENT Left 01/13/2023   Procedure: CYSTOSCOPY WITH RETROGRADE PYELOGRAM, URETEROSCOPY AND STENT PLACEMENT;  Surgeon: Twylla Glendia BROCKS, MD;  Location: [facility] ORS;  Service: Urology;  Laterality: Left;   CYSTOSCOPY/URETEROSCOPY/HOLMIUM LASER/STENT PLACEMENT Left 02/08/2023   Procedure: CYSTOSCOPY/URETEROSCOPY/HOLMIUM LASER/STENT EXCHANGE;  Surgeon: Twylla Glendia BROCKS, MD;  Location: [facility] ORS;  Service: Urology;  Laterality: Left;   ESOPHAGOGASTRODUODENOSCOPY N/A 11/02/2022   Procedure: ESOPHAGOGASTRODUODENOSCOPY (EGD);  Surgeon: Jinny Carmine, MD;  Location: [facility] ENDOSCOPY;  Service: Endoscopy;  Laterality: N/A;   EYE SURGERY     GIVENS CAPSULE STUDY N/A 12/22/2022   Procedure: GIVENS CAPSULE STUDY;  Surgeon: Jinny Carmine, MD;  Location: [facility] ENDOSCOPY;  Service: Endoscopy;  Laterality: N/A;   TONSILLECTOMY     TONSILLECTOMY AND ADENOIDECTOMY  1951   WISDOM TOOTH EXTRACTION  1970  Subjective Assessment     Subjective Patient reports having a good week and states she didn't get out in the snow over the weekend.    Pertinent History Tara Marquez is an 82yoF who is referred by PCP due to gradual progressive weakness since hospital admission in February 2024. Pt presents to PT eval with husband. Pt reports being in hospital for ~ a week in Feb 2024 with urinary obstruction, AKI, felt week initially but declined HHPT; pt/husband say progressively more weak over subsequent months, more difficulty with walking, husband has now asked her to use a cane. Pt reports 5 falls, 4 of which were  outside on natural surface 5th trying to clear a threshold (posterior fall buttock, back, hit head). Does get dizzy, but not frequently, not related to falls often.    How long can you sit comfortably? Not a limitation    How long can you stand comfortably? Not a limitation    How long can you walk comfortably? Mostly walks household distances, but can tolerate occasional store trips with buggy support.    Currently in Pain? No/denies               Objective measurements completed on examination: See above findings.    TODAY'S TREATMENT: 01/10/24  NMR:   High knee march at support bar without UE Support on airex pad x 20 reps each  Dynamic step tap onto 6 block without UE support x 20 reps each LE using  Dynamic standing balance (feet narrowed, staggered, near tandem) on airex pad (with varying feet positions playing word games at dry erase board) x several min-     THEREX:   Seated LAQ x 20 reps each LE Seated ham curls with BTB x 10 reps (Patient reported as hard)  Sit to stand  x 15 reps without UE support Forward step up 20 reps alt LE           PT Education -     Education Details Exercise technique- VC for safety    Safety in management of unlevel sidewalk to imrprove step height                      PT Short Term Goals -        PT SHORT TERM GOAL #1   Title compliant with balance HEP    Time 4    Period Weeks    Status Achieved    Target Date 08/12/23      PT SHORT TERM GOAL #2   Title Improved overground gait speed over 529ft to improve ability to access limited community distances safely.    Baseline eval: 374ft at 0.55m/s; 647ft s device 0.39m/s.   Time 4    Period Weeks    Status Achieved     Target Date 08/12/23               PT Long Term Goals -       PT LONG TERM GOAL #1   Title FOTO score improvement >12 points to indicate improve confidence in balance.    Baseline 07/19/2023=43; 08/22/23: 51; 09/08/2023 =will assess next  visit; 10/22= 52; 12/01/2023= 57   Time 12   Period Weeks    Status MET   Target Date 12/01/2023     PT LONG TERM GOAL #2   Title Improvement in standardized balance assessment by MCID or greater.    Baseline 07/19/2023= BERG- 35/56; 08/22/23: 44   Time  8    Period Weeks    Status Achieved     Target Date 09/09/23      PT LONG TERM GOAL #3   Title Pt to demonstrate 5xSTS hands free from standard height surface to indicated improved BLE power.    Baseline 07/19/2023= 31.07 sec without UE Support; 08/22/23: 19.15sec (hands on thighs); 09/08/2023= 16.42 sec; 12/01/2023= 14.42 sec without UE support    Time 8    Period Weeks    Status MET   Target Date 09/09/23      PT LONG TERM GOAL #4   Title Pt to demonstrate performance of 2029ft AMB >0.13m/s without increased pain and without LOB at modI level or less to restore ability to access comunity distances for IADL.    Baseline 08/22/23: 667ft in 4:02 (0.45m/s); 08/25/2023= 1200 in . 9/12= 0.62 m/s; 12/01/2023= will reassess next visit   Time 8    Period Weeks    Status Ongoing    Target Date 02/23/2024        PT LONG TERM GOAL #5  Title Pt will improve BERG by at least 3 points in order to demonstrate clinically significant improvement in balance.   Baseline 09/08/2023= 46/56; 12/01/2023= 47/56  Time 12  Period Weeks   Status PROGRESSING  Target Date 02/23/2024      PT LONG TERM GOAL #6  Title  Pt will decrease 5TSTS by at least 3 seconds in order to demonstrate clinically significant improvement in LE strength.  Baseline 09/08/2023= 16.42 sec 12/01/2023= 14.42 sec without UE support   Time 12  Period Weeks   Status  PROGRESSING  Target Date 02/23/2024        Plan -     Clinical Impression Statement Patient continues to progress with today's interventions- she is consistently performing more sit to stand and less unsteadiness with balance activities. No LOB with dynamic standing today during word game as well.  Pt will benefit  from continued PT services to address and improve her functional strength and mobility to assist in return to previous level of function.    Personal Factors and Comorbidities Age;Fitness;Behavior Pattern;Past/Current Experience    Examination-Activity Limitations Locomotion Level;Transfers;Lift;Stairs    Examination-Participation Restrictions Meal Prep;Cleaning;Community Activity;Driving;Interpersonal Relationship;Yard Work    Stability/Clinical Decision Making Stable/Uncomplicated    Clinical Decision Making Moderate    Rehab Potential Good    PT Frequency 1-2x / week    PT Duration 12 weeks    PT Treatment/Interventions ADLs/Self Care Home Management;Gait training;Stair training;Functional mobility training;Neuromuscular re-education;Balance training;Therapeutic exercise;Therapeutic activities;Passive range of motion;Manual techniques    PT Next Visit Plan Continue with balance and therex  for improved strength and balance         PT Home Exercise Plan asked at eval to check and log BP twice daily,   Access Code: [email or web address] URL: [email or web address] Date: 07/21/2023 Prepared by: Massie Dollar  Exercises - Sit to Stand with Armchair  - 1 x daily - 5 x weekly - 2 sets - 6 reps - Standing Hip Abduction with Counter Support  - 1 x daily - 7 x weekly - 3 sets - 10 reps - Seated Hip Abduction with Resistance  - 1 x daily - 7 x weekly - 3 sets - 10 reps - Seated March with Resistance  - 1 x daily - 7 x weekly - 3 sets - 10 reps   Consulted and Agree with Plan of Care Patient;Family member/caregiver  Patient will benefit from skilled therapeutic intervention in order to improve the following deficits and impairments:   balance impairments, endurance deficits, reduced strength,   Visit Diagnosis: Difficulty in walking, not elsewhere classified  Muscle weakness (generalized)  Unsteadiness on feet  Repeated falls     Problem List Patient Active  Problem List   Diagnosis Date Noted   Obstructive nephropathy 01/13/2023   Acute pyelonephritis 01/13/2023   AKI (acute kidney injury) (HCC) 01/13/2023   Hypokalemia 01/13/2023   Diarrhea 01/13/2023   Thoracic aortic aneurysm without rupture (HCC) 11/09/2022   Hematuria 11/05/2022   Polyp of esophagus 11/02/2022   Iron deficiency anemia 07/28/2022   Positive colorectal cancer screening using Cologuard test    Abnormal ECG 03/31/2021   Acute non-recurrent sinusitis 11/30/2020   Acute otitis media 11/30/2020   Benign essential HTN 10/31/2018   Frequent PVCs 09/08/2017   A-fib (HCC) 08/27/2015   Clinical depression 08/27/2015   Blood pressure elevated 08/27/2015   Polypharmacy 08/27/2015   Esophagitis, reflux 08/27/2015   Fatigue 08/27/2015   Acid reflux 08/27/2015   HLD (hyperlipidemia) 08/27/2015   Adult hypothyroidism 08/27/2015   Adaptive colitis 08/27/2015   Malaise and fatigue 08/27/2015   Mild major depression (HCC) 08/27/2015   Polymyalgia rheumatica (HCC) 08/27/2015   Cranial arteritis (HCC) 08/27/2015   Avitaminosis D 08/27/2015      Chyrl London, PT Physical Therapist - [place]  [facility]  5:23 PM 01/10/24

## 2024-01-11 ENCOUNTER — Ambulatory Visit: Payer: Medicare Other

## 2024-01-17 ENCOUNTER — Ambulatory Visit: Payer: Medicare Other | Admitting: Physical Therapy

## 2024-01-18 ENCOUNTER — Ambulatory Visit: Payer: Medicare Other

## 2024-01-19 ENCOUNTER — Ambulatory Visit: Payer: Medicare Other | Admitting: Physical Therapy

## 2024-01-23 ENCOUNTER — Ambulatory Visit: Payer: Medicare Other

## 2024-01-23 DIAGNOSIS — R262 Difficulty in walking, not elsewhere classified: Secondary | ICD-10-CM

## 2024-01-23 DIAGNOSIS — R296 Repeated falls: Secondary | ICD-10-CM

## 2024-01-23 DIAGNOSIS — R2681 Unsteadiness on feet: Secondary | ICD-10-CM

## 2024-01-23 DIAGNOSIS — M6281 Muscle weakness (generalized): Secondary | ICD-10-CM

## 2024-01-23 NOTE — Therapy (Signed)
[facility] Outpatient Rehabilitation at [facility] [address] [place], [place], [address] Phone: [phone]   Fax:  [phone]  Outpatient Physical Therapy Treatment   Patient Details  Name: Tara Marquez MRN: [id number] Date of Birth: Jul 19, 1941 Referring Provider (PT): Julieanne Manson MD (PCP)   Encounter Date: 01/23/2024   PT End of Session - 01/23/24 0940     Visit Number 36    Number of Visits 54    Date for PT Re-Evaluation 02/23/24    Authorization Type Medicare A&B primary with BCBS supplemental secondary    Authorization Time Period 07/14/23-09/08/23    Progress Note Due on Visit 40    PT Start Time 0935    PT Stop Time 1014    PT Time Calculation (min) 39 min    Equipment Utilized During Treatment Gait belt    Activity Tolerance Patient tolerated treatment well    Behavior During Therapy WFL for tasks assessed/performed                          Past Medical History:  Diagnosis Date   Acute pyelonephritis    Adaptive colitis    Anemia    Aortic atherosclerosis (HCC)    Atrial fibrillation and flutter (HCC)    a.) CHA2DS2VASc = 5 (age x 2, sex, HTN, vascular disease history);  b.) rate/rhythm maintained on oral amiodarone + metoprolol succinate; chronically anticoagulated with dose reduced apixaban   CAD (coronary artery disease)    Depression    Descending thoracic aortic aneurysm (HCC)    a.) CT chest 09/29/2022: 4.9 cm; b.) CT abd 01/13/2023: 4.9 cm   Frequent PVCs    GERD (gastroesophageal reflux disease)    Hepatic steatosis    History of 2019 novel coronavirus disease (COVID-19) 12/04/2021   Hyperlipidemia    Hypertension    Hypokalemia    Hypothyroidism    Long term current use of amiodarone    Long term current use of anticoagulant    a.) dose reduced apixaban   Nephrolithiasis    Osteopenia    Polymyalgia rheumatica (HCC)    Skin cancer    Splenomegaly    Temporal arteritis (HCC)    Vitamin D  deficiency     Past Surgical History:  Procedure Laterality Date   ABDOMINAL HYSTERECTOMY  03/1971   Status Post Hysterectomy   CATARACT EXTRACTION Left    COLONOSCOPY     2010   COLONOSCOPY WITH PROPOFOL N/A 01/12/2022   Procedure: COLONOSCOPY WITH PROPOFOL;  Surgeon: Midge Minium, MD;  Location: [facility] ENDOSCOPY;  Service: Endoscopy;  Laterality: N/A;   CYSTOSCOPY WITH RETROGRADE PYELOGRAM, URETEROSCOPY AND STENT PLACEMENT Left 01/13/2023   Procedure: CYSTOSCOPY WITH RETROGRADE PYELOGRAM, URETEROSCOPY AND STENT PLACEMENT;  Surgeon: Riki Altes, MD;  Location: [facility] ORS;  Service: Urology;  Laterality: Left;   CYSTOSCOPY/URETEROSCOPY/HOLMIUM LASER/STENT PLACEMENT Left 02/08/2023   Procedure: CYSTOSCOPY/URETEROSCOPY/HOLMIUM LASER/STENT EXCHANGE;  Surgeon: Riki Altes, MD;  Location: [facility] ORS;  Service: Urology;  Laterality: Left;   ESOPHAGOGASTRODUODENOSCOPY N/A 11/02/2022   Procedure: ESOPHAGOGASTRODUODENOSCOPY (EGD);  Surgeon: Midge Minium, MD;  Location: [facility] ENDOSCOPY;  Service: Endoscopy;  Laterality: N/A;   EYE SURGERY     GIVENS CAPSULE STUDY N/A 12/22/2022   Procedure: GIVENS CAPSULE STUDY;  Surgeon: Midge Minium, MD;  Location: [facility] ENDOSCOPY;  Service: Endoscopy;  Laterality: N/A;   TONSILLECTOMY     TONSILLECTOMY AND ADENOIDECTOMY  1951   WISDOM TOOTH EXTRACTION  1970  Subjective Assessment     Subjective Patient reports missing PT due to bad weather. States her stomach is bothering her this morning.    Pertinent History Tara Marquez is an 82yoF who is referred by PCP due to gradual progressive weakness since hospital admission in February 2024. Pt presents to PT eval with husband. Pt reports being in hospital for ~ a week in Feb 2024 with urinary obstruction, AKI, felt week initially but declined HHPT; pt/husband say progressively more weak over subsequent months, more difficulty with walking, husband has now asked her to use a cane. Pt reports 5 falls, 4 of which  were outside on natural surface 5th trying to clear a threshold (posterior fall buttock, back, hit head). Does get dizzy, but not frequently, not related to falls often.    How long can you sit comfortably? Not a limitation    How long can you stand comfortably? Not a limitation    How long can you walk comfortably? Mostly walks household distances, but can tolerate occasional store trips with buggy support.    Currently in Pain? No/denies               Objective measurements completed on examination: See above findings.    TODAY'S TREATMENT: 01/23/24  NMR:   Resistive gait with 3# AW- 450 feet - focusing on VC for "pick up your feet)    Dynamic step over 1/2 foam- fwd then backward with 1UE support x 20 reps using 3# AW  Dynamic step over 1/2 foam- lateral  with 1UE support x 20 reps 3# AW    Dynamic standing balance (feet narrowed, staggered, near tandem) on airex pad (with varying feet positions playing word games at dry erase board) x several min-     THEREX:   Seated LAQ 3# x15  reps each LE Seated ham curls with BTB x 15 reps  Sit to stand  x 15 reps with BUE overhead ball raise - 2kg ball Standing calf raises - 3# AW on 1/2 foam roll = 20 reps BLE           PT Education -     Education Details Exercise technique- VC for safety    Safety in management of unlevel sidewalk to imrprove step height                      PT Short Term Goals -        PT SHORT TERM GOAL #1   Title compliant with balance HEP    Time 4    Period Weeks    Status Achieved    Target Date 08/12/23      PT SHORT TERM GOAL #2   Title Improved overground gait speed over 544ft to improve ability to access limited community distances safely.    Baseline eval: 350ft at 0.13m/s; 649ft s device 0.56m/s.   Time 4    Period Weeks    Status Achieved     Target Date 08/12/23               PT Long Term Goals -       PT LONG TERM GOAL #1   Title FOTO score  improvement >12 points to indicate improve confidence in balance.    Baseline 07/19/2023=43; 08/22/23: 51; 09/08/2023 =will assess next visit; 10/22= 52; 12/01/2023= 57   Time 12   Period Weeks    Status MET   Target Date 12/01/2023  PT LONG TERM GOAL #2   Title Improvement in standardized balance assessment by MCID or greater.    Baseline 07/19/2023= BERG- 35/56; 08/22/23: 44   Time 8    Period Weeks    Status Achieved     Target Date 09/09/23      PT LONG TERM GOAL #3   Title Pt to demonstrate 5xSTS hands free from standard height surface to indicated improved BLE power.    Baseline 07/19/2023= 31.07 sec without UE Support; 08/22/23: 19.15sec (hands on thighs); 09/08/2023= 16.42 sec; 12/01/2023= 14.42 sec without UE support    Time 8    Period Weeks    Status MET   Target Date 09/09/23      PT LONG TERM GOAL #4   Title Pt to demonstrate performance of 2028ft AMB >0.16m/s without increased pain and without LOB at modI level or less to restore ability to access comunity distances for IADL.    Baseline 08/22/23: 618ft in 4:02 (0.38m/s); 08/25/2023= 1200 in . 9/12= 0.62 m/s; 12/01/2023= will reassess next visit   Time 8    Period Weeks    Status Ongoing    Target Date 02/23/2024        PT LONG TERM GOAL #5  Title Pt will improve BERG by at least 3 points in order to demonstrate clinically significant improvement in balance.   Baseline 09/08/2023= 46/56; 12/01/2023= 47/56  Time 12  Period Weeks   Status PROGRESSING  Target Date 02/23/2024      PT LONG TERM GOAL #6  Title  Pt will decrease 5TSTS by at least 3 seconds in order to demonstrate clinically significant improvement in LE strength.  Baseline 09/08/2023= 16.42 sec 12/01/2023= 14.42 sec without UE support   Time 12  Period Weeks   Status  PROGRESSING  Target Date 02/23/2024        Plan -     Clinical Impression Statement Patient returns after missing last week due to the weather. She responded well to added resistance  today and able to walk with only some VC to clear her feet. She was fatigued with activities but able to participate well- stating " I know I am going to be sore tomorrow." She will benefit from continued Strengthening to make her as safe with mobility as possible.  Pt will benefit from continued PT services to address and improve her functional strength and mobility to assist in return to previous level of function.    Personal Factors and Comorbidities Age;Fitness;Behavior Pattern;Past/Current Experience    Examination-Activity Limitations Locomotion Level;Transfers;Lift;Stairs    Examination-Participation Restrictions Meal Prep;Cleaning;Community Activity;Driving;Interpersonal Relationship;Yard Work    Stability/Clinical Decision Making Stable/Uncomplicated    Clinical Decision Making Moderate    Rehab Potential Good    PT Frequency 1-2x / week    PT Duration 12 weeks    PT Treatment/Interventions ADLs/Self Care Home Management;Gait training;Stair training;Functional mobility training;Neuromuscular re-education;Balance training;Therapeutic exercise;Therapeutic activities;Passive range of motion;Manual techniques    PT Next Visit Plan Continue with balance and therex  for improved strength and balance         PT Home Exercise Plan asked at eval to check and log BP twice daily,   Access Code: [email or web address] URL: [email or web address] Date: 07/21/2023 Prepared by: Grier Rocher  Exercises - Sit to Stand with Armchair  - 1 x daily - 5 x weekly - 2 sets - 6 reps - Standing Hip Abduction with Counter Support  - 1 x daily - 7 x weekly - 3 sets -  10 reps - Seated Hip Abduction with Resistance  - 1 x daily - 7 x weekly - 3 sets - 10 reps - Seated March with Resistance  - 1 x daily - 7 x weekly - 3 sets - 10 reps   Consulted and Agree with Plan of Care Patient;Family member/caregiver             Patient will benefit from skilled therapeutic intervention in order to improve the  following deficits and impairments:   balance impairments, endurance deficits, reduced strength,   Visit Diagnosis: Difficulty in walking, not elsewhere classified  Muscle weakness (generalized)  Unsteadiness on feet  Repeated falls     Problem List Patient Active Problem List   Diagnosis Date Noted   Obstructive nephropathy 01/13/2023   Acute pyelonephritis 01/13/2023   AKI (acute kidney injury) (HCC) 01/13/2023   Hypokalemia 01/13/2023   Diarrhea 01/13/2023   Thoracic aortic aneurysm without rupture (HCC) 11/09/2022   Hematuria 11/05/2022   Polyp of esophagus 11/02/2022   Iron deficiency anemia 07/28/2022   Positive colorectal cancer screening using Cologuard test    Abnormal ECG 03/31/2021   Acute non-recurrent sinusitis 11/30/2020   Acute otitis media 11/30/2020   Benign essential HTN 10/31/2018   Frequent PVCs 09/08/2017   A-fib (HCC) 08/27/2015   Clinical depression 08/27/2015   Blood pressure elevated 08/27/2015   Polypharmacy 08/27/2015   Esophagitis, reflux 08/27/2015   Fatigue 08/27/2015   Acid reflux 08/27/2015   HLD (hyperlipidemia) 08/27/2015   Adult hypothyroidism 08/27/2015   Adaptive colitis 08/27/2015   Malaise and fatigue 08/27/2015   Mild major depression (HCC) 08/27/2015   Polymyalgia rheumatica (HCC) 08/27/2015   Cranial arteritis (HCC) 08/27/2015   Avitaminosis D 08/27/2015      Louis Meckel, PT Physical Therapist - [place]  [facility]  10:16 AM 01/23/24

## 2024-01-25 ENCOUNTER — Ambulatory Visit: Payer: Medicare Other | Admitting: Physical Therapy

## 2024-01-30 ENCOUNTER — Ambulatory Visit: Payer: Medicare Other | Attending: Family Medicine

## 2024-01-30 DIAGNOSIS — R296 Repeated falls: Secondary | ICD-10-CM | POA: Diagnosis present

## 2024-01-30 DIAGNOSIS — R2681 Unsteadiness on feet: Secondary | ICD-10-CM

## 2024-01-30 DIAGNOSIS — M6281 Muscle weakness (generalized): Secondary | ICD-10-CM

## 2024-01-30 DIAGNOSIS — R262 Difficulty in walking, not elsewhere classified: Secondary | ICD-10-CM

## 2024-01-30 NOTE — Therapy (Signed)
[facility] Outpatient Rehabilitation at [facility] [address] [place], [place], [address] Phone: [phone]   Fax:  [phone]  Outpatient Physical Therapy Treatment   Patient Details  Name: Tara Marquez MRN: [id number] Date of Birth: [birth year] Referring Provider (PT): Julieanne Manson MD (PCP)   Encounter Date: 01/30/2024   PT End of Session - 01/30/24 0846     Visit Number 37    Number of Visits 54    Date for PT Re-Evaluation 02/23/24    Authorization Type Medicare A&B primary with BCBS supplemental secondary    Authorization Time Period 07/14/23-09/08/23    Progress Note Due on Visit 40    PT Start Time 0850    PT Stop Time 0932    PT Time Calculation (min) 42 min    Equipment Utilized During Treatment Gait belt    Activity Tolerance Patient tolerated treatment well    Behavior During Therapy WFL for tasks assessed/performed                          Past Medical History:  Diagnosis Date   Acute pyelonephritis    Adaptive colitis    Anemia    Aortic atherosclerosis (HCC)    Atrial fibrillation and flutter (HCC)    a.) CHA2DS2VASc = 5 (age x 2, sex, HTN, vascular disease history);  b.) rate/rhythm maintained on oral amiodarone + metoprolol succinate; chronically anticoagulated with dose reduced apixaban   CAD (coronary artery disease)    Depression    Descending thoracic aortic aneurysm (HCC)    a.) CT chest 09/29/2022: 4.9 cm; b.) CT abd 01/13/2023: 4.9 cm   Frequent PVCs    GERD (gastroesophageal reflux disease)    Hepatic steatosis    History of 2019 novel coronavirus disease (COVID-19) 12/04/2021   Hyperlipidemia    Hypertension    Hypokalemia    Hypothyroidism    Long term current use of amiodarone    Long term current use of anticoagulant    a.) dose reduced apixaban   Nephrolithiasis    Osteopenia    Polymyalgia rheumatica (HCC)    Skin cancer    Splenomegaly    Temporal arteritis (HCC)    Vitamin D  deficiency     Past Surgical History:  Procedure Laterality Date   ABDOMINAL HYSTERECTOMY  03/1971   Status Post Hysterectomy   CATARACT EXTRACTION Left    COLONOSCOPY     2010   COLONOSCOPY WITH PROPOFOL N/A 01/12/2022   Procedure: COLONOSCOPY WITH PROPOFOL;  Surgeon: Midge Minium, MD;  Location: [facility] ENDOSCOPY;  Service: Endoscopy;  Laterality: N/A;   CYSTOSCOPY WITH RETROGRADE PYELOGRAM, URETEROSCOPY AND STENT PLACEMENT Left 01/13/2023   Procedure: CYSTOSCOPY WITH RETROGRADE PYELOGRAM, URETEROSCOPY AND STENT PLACEMENT;  Surgeon: Riki Altes, MD;  Location: [facility] ORS;  Service: Urology;  Laterality: Left;   CYSTOSCOPY/URETEROSCOPY/HOLMIUM LASER/STENT PLACEMENT Left 02/08/2023   Procedure: CYSTOSCOPY/URETEROSCOPY/HOLMIUM LASER/STENT EXCHANGE;  Surgeon: Riki Altes, MD;  Location: [facility] ORS;  Service: Urology;  Laterality: Left;   ESOPHAGOGASTRODUODENOSCOPY N/A 11/02/2022   Procedure: ESOPHAGOGASTRODUODENOSCOPY (EGD);  Surgeon: Midge Minium, MD;  Location: [facility] ENDOSCOPY;  Service: Endoscopy;  Laterality: N/A;   EYE SURGERY     GIVENS CAPSULE STUDY N/A 12/22/2022   Procedure: GIVENS CAPSULE STUDY;  Surgeon: Midge Minium, MD;  Location: [facility] ENDOSCOPY;  Service: Endoscopy;  Laterality: N/A;   TONSILLECTOMY     TONSILLECTOMY AND ADENOIDECTOMY  1951   WISDOM TOOTH EXTRACTION  1970  Subjective Assessment     Subjective Patient reports no pain, or concerns currently. She has a busy week coming up. No updates or changes.   Pertinent History Tara Marquez is an 82yoF who is referred by PCP due to gradual progressive weakness since hospital admission in February 2024. Pt presents to PT eval with husband. Pt reports being in hospital for ~ a week in Feb 2024 with urinary obstruction, AKI, felt week initially but declined HHPT; pt/husband say progressively more weak over subsequent months, more difficulty with walking, husband has now asked her to use a cane. Pt reports 5 falls, 4 of  which were outside on natural surface 5th trying to clear a threshold (posterior fall buttock, back, hit head). Does get dizzy, but not frequently, not related to falls often.    How long can you sit comfortably? Not a limitation    How long can you stand comfortably? Not a limitation    How long can you walk comfortably? Mostly walks household distances, but can tolerate occasional store trips with buggy support.    Currently in Pain? No/denies               Objective measurements completed on examination: See above findings.    TODAY'S TREATMENT: 01/30/24   TA: Promote strength for gait/transfers  Seated LAQ 3# x15, x4  reps each LE - easy Standing calf raises - 3# AW 20 reps BLE Sit to stand  x 15 reps, 4x Seated ham curls with GTB x 15 reps   Gait training:  Resistive gait with 3# AW- approx 450 feet with SPC - focusing on VC for "pick up your feet" and heel strike, pt reports some difficulty with heel strike technique. Close CGA provided throughout   NMR: for improved balance/to decrease fall risk  Dynamic step over 1/2 foam- fwd then backward with 1UE support x 20 reps using 3# AW  Dynamic step over 1/2 foam- lateral  with 1UE support x 20 reps 3# AW  - consistently able to clear step  Dynamic standing balance (feet narrowed, staggered, near tandem) on airex pad (with varying feet positions playing word games at dry erase board) x several min            PT Education -     Education Details Exercise technique- VC for safety    Safety in management of unlevel sidewalk to imrprove step height                      PT Short Term Goals -        PT SHORT TERM GOAL #1   Title compliant with balance HEP    Time 4    Period Weeks    Status Achieved    Target Date 08/12/23      PT SHORT TERM GOAL #2   Title Improved overground gait speed over 566ft to improve ability to access limited community distances safely.    Baseline eval: 337ft at 0.31m/s;  626ft s device 0.35m/s.   Time 4    Period Weeks    Status Achieved     Target Date 08/12/23               PT Long Term Goals -       PT LONG TERM GOAL #1   Title FOTO score improvement >12 points to indicate improve confidence in balance.    Baseline 07/19/2023=43; 08/22/23: 51; 09/08/2023 =will assess next visit; 10/22= 52;  12/01/2023= 57   Time 12   Period Weeks    Status MET   Target Date 12/01/2023     PT LONG TERM GOAL #2   Title Improvement in standardized balance assessment by MCID or greater.    Baseline 07/19/2023= BERG- 35/56; 08/22/23: 44   Time 8    Period Weeks    Status Achieved     Target Date 09/09/23      PT LONG TERM GOAL #3   Title Pt to demonstrate 5xSTS hands free from standard height surface to indicated improved BLE power.    Baseline 07/19/2023= 31.07 sec without UE Support; 08/22/23: 19.15sec (hands on thighs); 09/08/2023= 16.42 sec; 12/01/2023= 14.42 sec without UE support    Time 8    Period Weeks    Status MET   Target Date 09/09/23      PT LONG TERM GOAL #4   Title Pt to demonstrate performance of 2073ft AMB >0.57m/s without increased pain and without LOB at modI level or less to restore ability to access comunity distances for IADL.    Baseline 08/22/23: 695ft in 4:02 (0.23m/s); 08/25/2023= 1200 in . 9/12= 0.62 m/s; 12/01/2023= will reassess next visit   Time 8    Period Weeks    Status Ongoing    Target Date 02/23/2024        PT LONG TERM GOAL #5  Title Pt will improve BERG by at least 3 points in order to demonstrate clinically significant improvement in balance.   Baseline 09/08/2023= 46/56; 12/01/2023= 47/56  Time 12  Period Weeks   Status PROGRESSING  Target Date 02/23/2024      PT LONG TERM GOAL #6  Title  Pt will decrease 5TSTS by at least 3 seconds in order to demonstrate clinically significant improvement in LE strength.  Baseline 09/08/2023= 16.42 sec 12/01/2023= 14.42 sec without UE support   Time 12  Period Weeks   Status   PROGRESSING  Target Date 02/23/2024        Plan -     Clinical Impression Statement Continued plan of care as laid out in recent sessions. Pt tolerated these well without pain and without significant fatigue. She still struggles with FWD/BCKWD step-over and utilizing correct gait technique. Pt will benefit from continued PT services to address and improve her functional strength and mobility to assist in return to previous level of function.    Personal Factors and Comorbidities Age;Fitness;Behavior Pattern;Past/Current Experience    Examination-Activity Limitations Locomotion Level;Transfers;Lift;Stairs    Examination-Participation Restrictions Meal Prep;Cleaning;Community Activity;Driving;Interpersonal Relationship;Yard Work    Stability/Clinical Decision Making Stable/Uncomplicated    Clinical Decision Making Moderate    Rehab Potential Good    PT Frequency 1-2x / week    PT Duration 12 weeks    PT Treatment/Interventions ADLs/Self Care Home Management;Gait training;Stair training;Functional mobility training;Neuromuscular re-education;Balance training;Therapeutic exercise;Therapeutic activities;Passive range of motion;Manual techniques    PT Next Visit Plan Continue with balance and therex  for improved strength and balance         PT Home Exercise Plan asked at eval to check and log BP twice daily,   Access Code: [email or web address] URL: [email or web address] Date: 07/21/2023 Prepared by: Grier Rocher  Exercises - Sit to Stand with Armchair  - 1 x daily - 5 x weekly - 2 sets - 6 reps - Standing Hip Abduction with Counter Support  - 1 x daily - 7 x weekly - 3 sets - 10 reps - Seated Hip Abduction with Resistance  - 1  x daily - 7 x weekly - 3 sets - 10 reps - Seated March with Resistance  - 1 x daily - 7 x weekly - 3 sets - 10 reps   Consulted and Agree with Plan of Care Patient;Family member/caregiver             Patient will benefit from skilled therapeutic  intervention in order to improve the following deficits and impairments:   balance impairments, endurance deficits, reduced strength,   Visit Diagnosis: Muscle weakness (generalized)  Difficulty in walking, not elsewhere classified  Unsteadiness on feet     Problem List Patient Active Problem List   Diagnosis Date Noted   Obstructive nephropathy 01/13/2023   Acute pyelonephritis 01/13/2023   AKI (acute kidney injury) (HCC) 01/13/2023   Hypokalemia 01/13/2023   Diarrhea 01/13/2023   Thoracic aortic aneurysm without rupture (HCC) 11/09/2022   Hematuria 11/05/2022   Polyp of esophagus 11/02/2022   Iron deficiency anemia 07/28/2022   Positive colorectal cancer screening using Cologuard test    Abnormal ECG 03/31/2021   Acute non-recurrent sinusitis 11/30/2020   Acute otitis media 11/30/2020   Benign essential HTN 10/31/2018   Frequent PVCs 09/08/2017   A-fib (HCC) 08/27/2015   Clinical depression 08/27/2015   Blood pressure elevated 08/27/2015   Polypharmacy 08/27/2015   Esophagitis, reflux 08/27/2015   Fatigue 08/27/2015   Acid reflux 08/27/2015   HLD (hyperlipidemia) 08/27/2015   Adult hypothyroidism 08/27/2015   Adaptive colitis 08/27/2015   Malaise and fatigue 08/27/2015   Mild major depression (HCC) 08/27/2015   Polymyalgia rheumatica (HCC) 08/27/2015   Cranial arteritis (HCC) 08/27/2015   Avitaminosis D 08/27/2015     Temple Pacini PT, DPT  Physical Therapist - [place]  [facility]  9:56 AM 01/30/24

## 2024-02-06 ENCOUNTER — Ambulatory Visit: Payer: Medicare Other | Admitting: Physical Therapy

## 2024-02-06 DIAGNOSIS — R262 Difficulty in walking, not elsewhere classified: Secondary | ICD-10-CM

## 2024-02-06 DIAGNOSIS — M6281 Muscle weakness (generalized): Secondary | ICD-10-CM

## 2024-02-06 DIAGNOSIS — R296 Repeated falls: Secondary | ICD-10-CM

## 2024-02-06 DIAGNOSIS — R2681 Unsteadiness on feet: Secondary | ICD-10-CM

## 2024-02-06 NOTE — Therapy (Signed)
 [facility] Outpatient Rehabilitation at [facility] [address] [place], [place], [address] Phone: [phone]   Fax:  [phone]  Outpatient Physical Therapy Treatment   Patient Details  Name: Tara Marquez MRN: [id number] Date of Birth: October 19, 1941 Referring Provider (PT): Gustavo Leigh MD (PCP)   Encounter Date: 02/06/2024   PT End of Session - 02/06/24 0856     Visit Number 38    Number of Visits 54    Date for PT Re-Evaluation 02/23/24    Authorization Type Medicare A&B primary with BCBS supplemental secondary    Authorization Time Period 07/14/23-09/08/23    Progress Note Due on Visit 40    PT Start Time 0846    PT Stop Time 0930    PT Time Calculation (min) 44 min    Equipment Utilized During Treatment Gait belt    Activity Tolerance Patient tolerated treatment well    Behavior During Therapy WFL for tasks assessed/performed                          Past Medical History:  Diagnosis Date   Acute pyelonephritis    Adaptive colitis    Anemia    Aortic atherosclerosis (HCC)    Atrial fibrillation and flutter (HCC)    a.) CHA2DS2VASc = 5 (age x 2, sex, HTN, vascular disease history);  b.) rate/rhythm maintained on oral amiodarone  + metoprolol  succinate; chronically anticoagulated with dose reduced apixaban    CAD (coronary artery disease)    Depression    Descending thoracic aortic aneurysm (HCC)    a.) CT chest 09/29/2022: 4.9 cm; b.) CT abd 01/13/2023: 4.9 cm   Frequent PVCs    GERD (gastroesophageal reflux disease)    Hepatic steatosis    History of 2019 novel coronavirus disease (COVID-19) 12/04/2021   Hyperlipidemia    Hypertension    Hypokalemia    Hypothyroidism    Long term current use of amiodarone     Long term current use of anticoagulant    a.) dose reduced apixaban    Nephrolithiasis    Osteopenia    Polymyalgia rheumatica (HCC)    Skin cancer    Splenomegaly    Temporal arteritis (HCC)    Vitamin D   deficiency     Past Surgical History:  Procedure Laterality Date   ABDOMINAL HYSTERECTOMY  03/1971   Status Post Hysterectomy   CATARACT EXTRACTION Left    COLONOSCOPY     2010   COLONOSCOPY WITH PROPOFOL  N/A 01/12/2022   Procedure: COLONOSCOPY WITH PROPOFOL ;  Surgeon: Marnee Sink, MD;  Location: [facility] ENDOSCOPY;  Service: Endoscopy;  Laterality: N/A;   CYSTOSCOPY WITH RETROGRADE PYELOGRAM, URETEROSCOPY AND STENT PLACEMENT Left 01/13/2023   Procedure: CYSTOSCOPY WITH RETROGRADE PYELOGRAM, URETEROSCOPY AND STENT PLACEMENT;  Surgeon: Geraline Knapp, MD;  Location: [facility] ORS;  Service: Urology;  Laterality: Left;   CYSTOSCOPY/URETEROSCOPY/HOLMIUM LASER/STENT PLACEMENT Left 02/08/2023   Procedure: CYSTOSCOPY/URETEROSCOPY/HOLMIUM LASER/STENT EXCHANGE;  Surgeon: Geraline Knapp, MD;  Location: [facility] ORS;  Service: Urology;  Laterality: Left;   ESOPHAGOGASTRODUODENOSCOPY N/A 11/02/2022   Procedure: ESOPHAGOGASTRODUODENOSCOPY (EGD);  Surgeon: Marnee Sink, MD;  Location: [facility] ENDOSCOPY;  Service: Endoscopy;  Laterality: N/A;   EYE SURGERY     GIVENS CAPSULE STUDY N/A 12/22/2022   Procedure: GIVENS CAPSULE STUDY;  Surgeon: Marnee Sink, MD;  Location: [facility] ENDOSCOPY;  Service: Endoscopy;  Laterality: N/A;   TONSILLECTOMY     TONSILLECTOMY AND ADENOIDECTOMY  1951   WISDOM TOOTH EXTRACTION  1970  Subjective Assessment     Subjective Patient reports no pain, near fall yesterday when shoe cough carpet at church. But was able to regain balance without fall     Pertinent History Tara Marquez is an 82yoF who is referred by PCP due to gradual progressive weakness since hospital admission in February 2024. Pt presents to PT eval with husband. Pt reports being in hospital for ~ a week in Feb 2024 with urinary obstruction, AKI, felt week initially but declined HHPT; pt/husband say progressively more weak over subsequent months, more difficulty with walking, husband has now asked her to use a cane. Pt  reports 5 falls, 4 of which were outside on natural surface 5th trying to clear a threshold (posterior fall buttock, back, hit head). Does get dizzy, but not frequently, not related to falls often.    How long can you sit comfortably? Not a limitation    How long can you stand comfortably? Not a limitation    How long can you walk comfortably? Mostly walks household distances, but can tolerate occasional store trips with buggy support.    Currently in Pain? No/denies               Objective measurements completed on examination: See above findings.    TODAY'S TREATMENT: 02/06/24   TE Promote strength for gait/transfers  Seated LAQ 3# x15, x3  reps each LE - easy Seated hip flexion x 12 3#AW x 3 bouts Isometric Hip adduction 2 x 15 with ball squeeze.   Sit to stand  x 15 reps, x3   Gait training:  Forward/reverse gait with 3#AW 82ft x 8 reps. Cues for step length in reverse gait. BLE, L>R.   Resistive gait with 3# AW- approx 300 feet without AD - focusing on VC for "pick up your feet" and heel strike, pt reports some difficulty with heel strike technique. Close CGA provided throughout        PT Education -     Education Details Exercise technique- VC for safety    Safety in management of unlevel sidewalk to imrprove step height                      PT Short Term Goals -        PT SHORT TERM GOAL #1   Title compliant with balance HEP    Time 4    Period Weeks    Status Achieved    Target Date 08/12/23      PT SHORT TERM GOAL #2   Title Improved overground gait speed over 567ft to improve ability to access limited community distances safely.    Baseline eval: 343ft at 0.71m/s; 667ft s device 0.7m/s.   Time 4    Period Weeks    Status Achieved     Target Date 08/12/23               PT Long Term Goals -       PT LONG TERM GOAL #1   Title FOTO score improvement >12 points to indicate improve confidence in balance.    Baseline 07/19/2023=43;  08/22/23: 51; 09/08/2023 =will assess next visit; 10/22= 52; 12/01/2023= 57   Time 12   Period Weeks    Status MET   Target Date 12/01/2023     PT LONG TERM GOAL #2   Title Improvement in standardized balance assessment by MCID or greater.    Baseline 07/19/2023= BERG- 35/56; 08/22/23: 44   Time  8    Period Weeks    Status Achieved     Target Date 09/09/23      PT LONG TERM GOAL #3   Title Pt to demonstrate 5xSTS hands free from standard height surface to indicated improved BLE power.    Baseline 07/19/2023= 31.07 sec without UE Support; 08/22/23: 19.15sec (hands on thighs); 09/08/2023= 16.42 sec; 12/01/2023= 14.42 sec without UE support    Time 8    Period Weeks    Status MET   Target Date 09/09/23      PT LONG TERM GOAL #4   Title Pt to demonstrate performance of 2042ft AMB >0.22m/s without increased pain and without LOB at modI level or less to restore ability to access comunity distances for IADL.    Baseline 08/22/23: 620ft in 4:02 (0.52m/s); 08/25/2023= 1200 in . 9/12= 0.62 m/s; 12/01/2023= will reassess next visit   Time 8    Period Weeks    Status Ongoing    Target Date 02/23/2024        PT LONG TERM GOAL #5  Title Pt will improve BERG by at least 3 points in order to demonstrate clinically significant improvement in balance.   Baseline 09/08/2023= 46/56; 12/01/2023= 47/56  Time 12  Period Weeks   Status PROGRESSING  Target Date 02/23/2024      PT LONG TERM GOAL #6  Title  Pt will decrease 5TSTS by at least 3 seconds in order to demonstrate clinically significant improvement in LE strength.  Baseline 09/08/2023= 16.42 sec 12/01/2023= 14.42 sec without UE support   Time 12  Period Weeks   Status  PROGRESSING  Target Date 02/23/2024        Plan -     Clinical Impression Statement Continued plan of care as laid out in recent sessions. Emphasis on BLE strengthening with forced step height on BLE without AD and in reverse gait. Pt will benefit from continued PT services  to address and improve her functional strength and mobility to assist in return to previous level of function.    Personal Factors and Comorbidities Age;Fitness;Behavior Pattern;Past/Current Experience    Examination-Activity Limitations Locomotion Level;Transfers;Lift;Stairs    Examination-Participation Restrictions Meal Prep;Cleaning;Community Activity;Driving;Interpersonal Relationship;Yard Work    Stability/Clinical Decision Making Stable/Uncomplicated    Clinical Decision Making Moderate    Rehab Potential Good    PT Frequency 1-2x / week    PT Duration 12 weeks    PT Treatment/Interventions ADLs/Self Care Home Management;Gait training;Stair training;Functional mobility training;Neuromuscular re-education;Balance training;Therapeutic exercise;Therapeutic activities;Passive range of motion;Manual techniques    PT Next Visit Plan Continue with balance and therex  for improved strength and balance         PT Home Exercise Plan asked at eval to check and log BP twice daily,   Access Code: [email or web address] URL: [email or web address] Date: 07/21/2023 Prepared by: Aurora Lees  Exercises - Sit to Stand with Armchair  - 1 x daily - 5 x weekly - 2 sets - 6 reps - Standing Hip Abduction with Counter Support  - 1 x daily - 7 x weekly - 3 sets - 10 reps - Seated Hip Abduction with Resistance  - 1 x daily - 7 x weekly - 3 sets - 10 reps - Seated March with Resistance  - 1 x daily - 7 x weekly - 3 sets - 10 reps   Consulted and Agree with Plan of Care Patient;Family member/caregiver             Patient will benefit  from skilled therapeutic intervention in order to improve the following deficits and impairments:   balance impairments, endurance deficits, reduced strength,   Visit Diagnosis: Muscle weakness (generalized)  Difficulty in walking, not elsewhere classified  Unsteadiness on feet  Repeated falls     Problem List Patient Active Problem List   Diagnosis Date  Noted   Obstructive nephropathy 01/13/2023   Acute pyelonephritis 01/13/2023   AKI (acute kidney injury) (HCC) 01/13/2023   Hypokalemia 01/13/2023   Diarrhea 01/13/2023   Thoracic aortic aneurysm without rupture (HCC) 11/09/2022   Hematuria 11/05/2022   Polyp of esophagus 11/02/2022   Iron deficiency anemia 07/28/2022   Positive colorectal cancer screening using Cologuard test    Abnormal ECG 03/31/2021   Acute non-recurrent sinusitis 11/30/2020   Acute otitis media 11/30/2020   Benign essential HTN 10/31/2018   Frequent PVCs 09/08/2017   A-fib (HCC) 08/27/2015   Clinical depression 08/27/2015   Blood pressure elevated 08/27/2015   Polypharmacy 08/27/2015   Esophagitis, reflux 08/27/2015   Fatigue 08/27/2015   Acid reflux 08/27/2015   HLD (hyperlipidemia) 08/27/2015   Adult hypothyroidism 08/27/2015   Adaptive colitis 08/27/2015   Malaise and fatigue 08/27/2015   Mild major depression (HCC) 08/27/2015   Polymyalgia rheumatica (HCC) 08/27/2015   Cranial arteritis (HCC) 08/27/2015   Avitaminosis D 08/27/2015     Aurora Lees PT, DPT  Physical Therapist - [place]  [facility]  9:57 AM 02/06/24

## 2024-02-13 ENCOUNTER — Ambulatory Visit: Payer: Medicare Other | Admitting: Physical Therapy

## 2024-02-19 NOTE — Therapy (Incomplete)
[facility] Outpatient Rehabilitation at [facility] [address] [place], [place], [address] Phone: [phone]   Fax:  [phone]  Outpatient Physical Therapy Treatment   Patient Details  Name: Tara Marquez MRN: [id number] Date of Birth: July 12, 1941 Referring Provider (PT): Julieanne Manson MD (PCP)   Encounter Date: 02/20/2024                  Past Medical History:  Diagnosis Date   Acute pyelonephritis    Adaptive colitis    Anemia    Aortic atherosclerosis (HCC)    Atrial fibrillation and flutter (HCC)    a.) CHA2DS2VASc = 5 (age x 2, sex, HTN, vascular disease history);  b.) rate/rhythm maintained on oral amiodarone + metoprolol succinate; chronically anticoagulated with dose reduced apixaban   CAD (coronary artery disease)    Depression    Descending thoracic aortic aneurysm (HCC)    a.) CT chest 09/29/2022: 4.9 cm; b.) CT abd 01/13/2023: 4.9 cm   Frequent PVCs    GERD (gastroesophageal reflux disease)    Hepatic steatosis    History of 2019 novel coronavirus disease (COVID-19) 12/04/2021   Hyperlipidemia    Hypertension    Hypokalemia    Hypothyroidism    Long term current use of amiodarone    Long term current use of anticoagulant    a.) dose reduced apixaban   Nephrolithiasis    Osteopenia    Polymyalgia rheumatica (HCC)    Skin cancer    Splenomegaly    Temporal arteritis (HCC)    Vitamin D deficiency     Past Surgical History:  Procedure Laterality Date   ABDOMINAL HYSTERECTOMY  03/1971   Status Post Hysterectomy   CATARACT EXTRACTION Left    COLONOSCOPY     2010   COLONOSCOPY WITH PROPOFOL N/A 01/12/2022   Procedure: COLONOSCOPY WITH PROPOFOL;  Surgeon: Midge Minium, MD;  Location: [facility] ENDOSCOPY;  Service: Endoscopy;  Laterality: N/A;   CYSTOSCOPY WITH RETROGRADE PYELOGRAM, URETEROSCOPY AND STENT PLACEMENT Left 01/13/2023   Procedure: CYSTOSCOPY WITH RETROGRADE PYELOGRAM, URETEROSCOPY AND STENT PLACEMENT;   Surgeon: Riki Altes, MD;  Location: [facility] ORS;  Service: Urology;  Laterality: Left;   CYSTOSCOPY/URETEROSCOPY/HOLMIUM LASER/STENT PLACEMENT Left 02/08/2023   Procedure: CYSTOSCOPY/URETEROSCOPY/HOLMIUM LASER/STENT EXCHANGE;  Surgeon: Riki Altes, MD;  Location: [facility] ORS;  Service: Urology;  Laterality: Left;   ESOPHAGOGASTRODUODENOSCOPY N/A 11/02/2022   Procedure: ESOPHAGOGASTRODUODENOSCOPY (EGD);  Surgeon: Midge Minium, MD;  Location: [facility] ENDOSCOPY;  Service: Endoscopy;  Laterality: N/A;   EYE SURGERY     GIVENS CAPSULE STUDY N/A 12/22/2022   Procedure: GIVENS CAPSULE STUDY;  Surgeon: Midge Minium, MD;  Location: [facility] ENDOSCOPY;  Service: Endoscopy;  Laterality: N/A;   TONSILLECTOMY     TONSILLECTOMY AND ADENOIDECTOMY  1951   WISDOM TOOTH EXTRACTION  1970       Subjective Assessment     Subjective *** Patient reports no pain, near fall yesterday when shoe cough carpet at church. But was able to regain balance without fall     Pertinent History Tara Marquez is an 82yoF who is referred by PCP due to gradual progressive weakness since hospital admission in February 2024. Pt presents to PT eval with husband. Pt reports being in hospital for ~ a week in Feb 2024 with urinary obstruction, AKI, felt week initially but declined HHPT; pt/husband say progressively more weak over subsequent months, more difficulty with walking, husband has now asked her to use a cane. Pt reports 5 falls, 4 of which  were outside on natural surface 5th trying to clear a threshold (posterior fall buttock, back, hit head). Does get dizzy, but not frequently, not related to falls often.    How long can you sit comfortably? Not a limitation    How long can you stand comfortably? Not a limitation    How long can you walk comfortably? Mostly walks household distances, but can tolerate occasional store trips with buggy support.    Currently in Pain? No/denies               Objective measurements completed on  examination: See above findings.    TODAY'S TREATMENT: 02/19/24   TE Promote strength for gait/transfers  Seated LAQ 3# x15, x3  reps each LE - easy Seated hip flexion x 12 3#AW x 3 bouts Isometric Hip adduction 2 x 15 with ball squeeze.   Sit to stand  x 15 reps, x3   Gait training:  Forward/reverse gait with 3#AW 52ft x 8 reps. Cues for step length in reverse gait. BLE, L>R.   Resistive gait with 3# AW- approx 300 feet without AD - focusing on VC for "pick up your feet" and heel strike, pt reports some difficulty with heel strike technique. Close CGA provided throughout        PT Education -     Education Details Exercise technique- VC for safety    Safety in management of unlevel sidewalk to imrprove step height                      PT Short Term Goals -        PT SHORT TERM GOAL #1   Title compliant with balance HEP    Time 4    Period Weeks    Status Achieved    Target Date 08/12/23      PT SHORT TERM GOAL #2   Title Improved overground gait speed over 534ft to improve ability to access limited community distances safely.    Baseline eval: 369ft at 0.16m/s; 611ft s device 0.66m/s.   Time 4    Period Weeks    Status Achieved     Target Date 08/12/23               PT Long Term Goals -       PT LONG TERM GOAL #1   Title FOTO score improvement >12 points to indicate improve confidence in balance.    Baseline 07/19/2023=43; 08/22/23: 51; 09/08/2023 =will assess next visit; 10/22= 52; 12/01/2023= 57   Time 12   Period Weeks    Status MET   Target Date 12/01/2023     PT LONG TERM GOAL #2   Title Improvement in standardized balance assessment by MCID or greater.    Baseline 07/19/2023= BERG- 35/56; 08/22/23: 44   Time 8    Period Weeks    Status Achieved     Target Date 09/09/23      PT LONG TERM GOAL #3   Title Pt to demonstrate 5xSTS hands free from standard height surface to indicated improved BLE power.    Baseline 07/19/2023= 31.07 sec  without UE Support; 08/22/23: 19.15sec (hands on thighs); 09/08/2023= 16.42 sec; 12/01/2023= 14.42 sec without UE support    Time 8    Period Weeks    Status MET   Target Date 09/09/23      PT LONG TERM GOAL #4   Title Pt to demonstrate performance of 2057ft AMB >0.64m/s without  increased pain and without LOB at modI level or less to restore ability to access comunity distances for IADL.    Baseline 08/22/23: 636ft in 4:02 (0.40m/s); 08/25/2023= 1200 in . 9/12= 0.62 m/s; 12/01/2023= will reassess next visit   Time 8    Period Weeks    Status Ongoing    Target Date 02/23/2024        PT LONG TERM GOAL #5  Title Pt will improve BERG by at least 3 points in order to demonstrate clinically significant improvement in balance.   Baseline 09/08/2023= 46/56; 12/01/2023= 47/56  Time 12  Period Weeks   Status PROGRESSING  Target Date 02/23/2024      PT LONG TERM GOAL #6  Title  Pt will decrease 5TSTS by at least 3 seconds in order to demonstrate clinically significant improvement in LE strength.  Baseline 09/08/2023= 16.42 sec 12/01/2023= 14.42 sec without UE support   Time 12  Period Weeks   Status  PROGRESSING  Target Date 02/23/2024        Plan -     Clinical Impression Statement Continued plan of care as laid out in recent sessions. Emphasis on BLE strengthening with forced step height on BLE without AD and in reverse gait. Pt will benefit from continued PT services to address and improve her functional strength and mobility to assist in return to previous level of function.    Personal Factors and Comorbidities Age;Fitness;Behavior Pattern;Past/Current Experience    Examination-Activity Limitations Locomotion Level;Transfers;Lift;Stairs    Examination-Participation Restrictions Meal Prep;Cleaning;Community Activity;Driving;Interpersonal Relationship;Yard Work    Stability/Clinical Decision Making Stable/Uncomplicated    Clinical Decision Making Moderate    Rehab Potential Good    PT  Frequency 1-2x / week    PT Duration 12 weeks    PT Treatment/Interventions ADLs/Self Care Home Management;Gait training;Stair training;Functional mobility training;Neuromuscular re-education;Balance training;Therapeutic exercise;Therapeutic activities;Passive range of motion;Manual techniques    PT Next Visit Plan Continue with balance and therex  for improved strength and balance         PT Home Exercise Plan asked at eval to check and log BP twice daily,   Access Code: [email or web address] URL: [email or web address] Date: 07/21/2023 Prepared by: Grier Rocher  Exercises - Sit to Stand with Armchair  - 1 x daily - 5 x weekly - 2 sets - 6 reps - Standing Hip Abduction with Counter Support  - 1 x daily - 7 x weekly - 3 sets - 10 reps - Seated Hip Abduction with Resistance  - 1 x daily - 7 x weekly - 3 sets - 10 reps - Seated March with Resistance  - 1 x daily - 7 x weekly - 3 sets - 10 reps   Consulted and Agree with Plan of Care Patient;Family member/caregiver             Patient will benefit from skilled therapeutic intervention in order to improve the following deficits and impairments:   balance impairments, endurance deficits, reduced strength,   Visit Diagnosis: No diagnosis found.     Problem List Patient Active Problem List   Diagnosis Date Noted   Obstructive nephropathy 01/13/2023   Acute pyelonephritis 01/13/2023   AKI (acute kidney injury) (HCC) 01/13/2023   Hypokalemia 01/13/2023   Diarrhea 01/13/2023   Thoracic aortic aneurysm without rupture (HCC) 11/09/2022   Hematuria 11/05/2022   Polyp of esophagus 11/02/2022   Iron deficiency anemia 07/28/2022   Positive colorectal cancer screening using Cologuard test    Abnormal ECG 03/31/2021   Acute  non-recurrent sinusitis 11/30/2020   Acute otitis media 11/30/2020   Benign essential HTN 10/31/2018   Frequent PVCs 09/08/2017   A-fib (HCC) 08/27/2015   Clinical depression 08/27/2015   Blood pressure  elevated 08/27/2015   Polypharmacy 08/27/2015   Esophagitis, reflux 08/27/2015   Fatigue 08/27/2015   Acid reflux 08/27/2015   HLD (hyperlipidemia) 08/27/2015   Adult hypothyroidism 08/27/2015   Adaptive colitis 08/27/2015   Malaise and fatigue 08/27/2015   Mild major depression (HCC) 08/27/2015   Polymyalgia rheumatica (HCC) 08/27/2015   Cranial arteritis (HCC) 08/27/2015   Avitaminosis D 08/27/2015     Louis Meckel, PT  Physical Therapist - [place]  [facility]  6:25 PM 02/19/24

## 2024-02-20 ENCOUNTER — Ambulatory Visit: Payer: Medicare Other

## 2024-02-27 ENCOUNTER — Ambulatory Visit: Payer: Medicare Other | Attending: Family Medicine | Admitting: Physical Therapy

## 2024-02-27 DIAGNOSIS — R296 Repeated falls: Secondary | ICD-10-CM | POA: Diagnosis present

## 2024-02-27 DIAGNOSIS — R2681 Unsteadiness on feet: Secondary | ICD-10-CM | POA: Insufficient documentation

## 2024-02-27 DIAGNOSIS — M6281 Muscle weakness (generalized): Secondary | ICD-10-CM | POA: Diagnosis present

## 2024-02-27 DIAGNOSIS — R262 Difficulty in walking, not elsewhere classified: Secondary | ICD-10-CM | POA: Insufficient documentation

## 2024-02-27 NOTE — Therapy (Signed)
 [facility] Outpatient Rehabilitation at [facility] [address] [place], [place], [address] Phone: [phone]   Fax:  [phone]  Outpatient Physical Therapy Treatment/ Re-certification.    Patient Details  Name: Tara Marquez MRN: [id number] Date of Birth: [birth year] Referring Provider (PT): Julieanne Manson MD (PCP)   Encounter Date: 02/27/2024   PT End of Session - 02/27/24 1409     Visit Number 39    Number of Visits 55    Date for PT Re-Evaluation 04/23/24    Authorization Type Medicare A&B primary with BCBS supplemental secondary    Authorization Time Period 07/14/23-09/08/23    Progress Note Due on Visit 40    PT Start Time 1402    PT Stop Time 1442    PT Time Calculation (min) 40 min    Equipment Utilized During Treatment Gait belt    Activity Tolerance Patient tolerated treatment well    Behavior During Therapy WFL for tasks assessed/performed                           Past Medical History:  Diagnosis Date   Acute pyelonephritis    Adaptive colitis    Anemia    Aortic atherosclerosis (HCC)    Atrial fibrillation and flutter (HCC)    a.) CHA2DS2VASc = 5 (age x 2, sex, HTN, vascular disease history);  b.) rate/rhythm maintained on oral amiodarone + metoprolol succinate; chronically anticoagulated with dose reduced apixaban   CAD (coronary artery disease)    Depression    Descending thoracic aortic aneurysm (HCC)    a.) CT chest 09/29/2022: 4.9 cm; b.) CT abd 01/13/2023: 4.9 cm   Frequent PVCs    GERD (gastroesophageal reflux disease)    Hepatic steatosis    History of 2019 novel coronavirus disease (COVID-19) 12/04/2021   Hyperlipidemia    Hypertension    Hypokalemia    Hypothyroidism    Long term current use of amiodarone    Long term current use of anticoagulant    a.) dose reduced apixaban   Nephrolithiasis    Osteopenia    Polymyalgia rheumatica (HCC)    Skin cancer    Splenomegaly    Temporal arteritis  (HCC)    Vitamin D deficiency     Past Surgical History:  Procedure Laterality Date   ABDOMINAL HYSTERECTOMY  03/1971   Status Post Hysterectomy   CATARACT EXTRACTION Left    COLONOSCOPY     2010   COLONOSCOPY WITH PROPOFOL N/A 01/12/2022   Procedure: COLONOSCOPY WITH PROPOFOL;  Surgeon: Midge Minium, MD;  Location: [facility] ENDOSCOPY;  Service: Endoscopy;  Laterality: N/A;   CYSTOSCOPY WITH RETROGRADE PYELOGRAM, URETEROSCOPY AND STENT PLACEMENT Left 01/13/2023   Procedure: CYSTOSCOPY WITH RETROGRADE PYELOGRAM, URETEROSCOPY AND STENT PLACEMENT;  Surgeon: Riki Altes, MD;  Location: [facility] ORS;  Service: Urology;  Laterality: Left;   CYSTOSCOPY/URETEROSCOPY/HOLMIUM LASER/STENT PLACEMENT Left 02/08/2023   Procedure: CYSTOSCOPY/URETEROSCOPY/HOLMIUM LASER/STENT EXCHANGE;  Surgeon: Riki Altes, MD;  Location: [facility] ORS;  Service: Urology;  Laterality: Left;   ESOPHAGOGASTRODUODENOSCOPY N/A 11/02/2022   Procedure: ESOPHAGOGASTRODUODENOSCOPY (EGD);  Surgeon: Midge Minium, MD;  Location[facility] ENDOSCOPY;  Service: Endoscopy;  Laterality: N/A;   EYE SURGERY     GIVENS CAPSULE STUDY N/A 12/22/2022   Procedure: GIVENS CAPSULE STUDY;  Surgeon: Midge Minium, MD;  Location: [facility] ENDOSCOPY;  Service: Endoscopy;  Laterality: N/A;   TONSILLECTOMY     TONSILLECTOMY AND ADENOIDECTOMY  1951   WISDOM TOOTH EXTRACTION  1970       Subjective Assessment     Subjective  Patient reports no pain, pt reports that she felt like she was doing better with her balance until about 2-3 weeks ago, since missing PT treatments due to weather and management of husband with radiation treatments for CA.   Pt states that she has been inconsistent with HEP over the last 2 weeks, completing them once or twice a week.     Pertinent History Tara Marquez is an 82yoF who is referred by PCP due to gradual progressive weakness since hospital admission in February 2024. Pt presents to PT eval with husband. Pt reports being in  hospital for ~ a week in Feb 2024 with urinary obstruction, AKI, felt week initially but declined HHPT; pt/husband say progressively more weak over subsequent months, more difficulty with walking, husband has now asked her to use a cane. Pt reports 5 falls, 4 of which were outside on natural surface 5th trying to clear a threshold (posterior fall buttock, back, hit head). Does get dizzy, but not frequently, not related to falls often.    How long can you sit comfortably? Not a limitation    How long can you stand comfortably? Not a limitation    How long can you walk comfortably? Mostly walks household distances, but can tolerate occasional store trips with buggy support.    Currently in Pain? No/denies               Objective measurements completed on examination: See above findings.    TODAY'S TREATMENT: 02/27/24  Nustep reciprocal movement training x 6 min  level 1-3 with cues for consistent SPM through varied resistance for neurological priming.   Pt performed 5 time sit<>stand (5xSTS): 14.74 sec  (>15 sec indicates increased fall risk; 15.39, 14.09 sec)   Patient demonstrates increased fall risk as noted by score of   48/56 on Berg Balance Scale.  (<36= high risk for falls, close to 100%; 37-45 significant >80%; 46-51 moderate >50%; 52-55 lower >25%)  2069ft walk test:  Pt able to complete 1580ft in 11:49 min  performed with SPC.   PT provided supervision for safety; no LOB noted, but decreased step length with fatigue on the BLE.     PT Education -     Education Details Exercise technique- VC for safety    Safety in management of unlevel sidewalk to imrprove step height                      PT Short Term Goals -        PT SHORT TERM GOAL #1   Title compliant with balance HEP    Time 4    Period Weeks    Status Achieved    Target Date 08/12/23      PT SHORT TERM GOAL #2   Title Improved overground gait speed over 578ft to improve ability to access limited  community distances safely.    Baseline eval: 372ft at 0.45m/s; 648ft s device 0.86m/s.   Time 4    Period Weeks    Status Achieved     Target Date 08/12/23               PT Long Term Goals -       PT LONG TERM GOAL #1   Title FOTO score improvement >12 points to indicate improve confidence in balance.    Baseline 07/19/2023=43; 08/22/23: 51; 09/08/2023 =will assess next visit;  10/22= 52; 12/01/2023= 57   Time 12   Period Weeks    Status MET   Target Date 12/01/2023     PT LONG TERM GOAL #2   Title Improvement in standardized balance assessment by MCID or greater.    Baseline 07/19/2023= BERG- 35/56; 08/22/23: 44   Time 8    Period Weeks    Status Achieved     Target Date 09/09/23      PT LONG TERM GOAL #3   Title Pt to demonstrate 5xSTS hands free from standard height surface to indicated improved BLE power.    Baseline 07/19/2023= 31.07 sec without UE Support; 08/22/23: 19.15sec (hands on thighs); 09/08/2023= 16.42 sec; 12/01/2023= 14.42 sec without UE support    Time 8    Period Weeks    Status MET   Target Date 09/09/23      PT LONG TERM GOAL #4   Title Pt to demonstrate performance of 2039ft AMB >0.40m/s without increased pain and without LOB at modI level or less to restore ability to access comunity distances for IADL.    Baseline 08/22/23: 634ft in 4:02 (0.49m/s); 08/25/2023= 1200 in . 9/12= 0.62 m/s; 12/01/2023= will reassess next visit 02/27/2024: 1511ft in 11:38min with SPC    Time 8    Period Weeks    Status Ongoing    Target Date 04/23/2024          PT LONG TERM GOAL #5  Title Pt will improve BERG by at least 3 points in order to demonstrate clinically significant improvement in balance.   Baseline 09/08/2023 = 46/56; 12/01/2023= 47/56 02/27/2024: 48   Time 12  Period Weeks   Status PROGRESSING  Target Date 04/23/2024      PT LONG TERM GOAL #6  Title  Pt will decrease 5TSTS by at least 3 seconds in order to demonstrate clinically significant improvement  in LE strength.  Baseline 09/08/2023= 16.42 sec 12/01/2023= 14.42 sec without UE support  02/27/2024: 14.74 sec without UE support   Time 12  Period Weeks   Status  PROGRESSING  Target Date 04/23/2024        Plan -     Clinical Impression Statement PT instructed pt in goal assessment for re-certification ; Pt demonstrated mild improvement in balance and gait tolerance, but mild decrease in overall gait speed. Sustained progress in 5x STS mild improved in Berg balance scale. Pt feels that she was doing better, but missed 2 weeks of PT treatments due to weather and family obligations, and has felt a little bit of a regression. Patient's condition has the potential to improve in response to therapy. Maximum improvement is yet to be obtained. The anticipated improvement is attainable and reasonable in a generally predictable time.   Pt will benefit from continued PT services to address and improve her functional strength and mobility to assist in return to previous level of function.    Personal Factors and Comorbidities Age;Fitness;Behavior Pattern;Past/Current Experience    Examination-Activity Limitations Locomotion Level;Transfers;Lift;Stairs    Examination-Participation Restrictions Meal Prep;Cleaning;Community Activity;Driving;Interpersonal Relationship;Yard Work    Stability/Clinical Decision Making Stable/Uncomplicated    Clinical Decision Making Moderate    Rehab Potential Good    PT Frequency 1-2x / week    PT Duration 12 weeks    PT Treatment/Interventions ADLs/Self Care Home Management;Gait training;Stair training;Functional mobility training;Neuromuscular re-education;Balance training;Therapeutic exercise;Therapeutic activities;Passive range of motion;Manual techniques    PT Next Visit Plan Continue with balance and therex  for improved strength and balance  PT Home Exercise Plan asked at eval to check and log BP twice daily,   Access Code: [email or web address] URL:  [email or web address] Date: 07/21/2023 Prepared by: Grier Rocher  Exercises - Sit to Stand with Armchair  - 1 x daily - 5 x weekly - 2 sets - 6 reps - Standing Hip Abduction with Counter Support  - 1 x daily - 7 x weekly - 3 sets - 10 reps - Seated Hip Abduction with Resistance  - 1 x daily - 7 x weekly - 3 sets - 10 reps - Seated March with Resistance  - 1 x daily - 7 x weekly - 3 sets - 10 reps   Consulted and Agree with Plan of Care Patient;Family member/caregiver             Patient will benefit from skilled therapeutic intervention in order to improve the following deficits and impairments:   balance impairments, endurance deficits, reduced strength,   Visit Diagnosis: Muscle weakness (generalized)  Difficulty in walking, not elsewhere classified  Unsteadiness on feet  Repeated falls     Problem List Patient Active Problem List   Diagnosis Date Noted   Obstructive nephropathy 01/13/2023   Acute pyelonephritis 01/13/2023   AKI (acute kidney injury) (HCC) 01/13/2023   Hypokalemia 01/13/2023   Diarrhea 01/13/2023   Thoracic aortic aneurysm without rupture (HCC) 11/09/2022   Hematuria 11/05/2022   Polyp of esophagus 11/02/2022   Iron deficiency anemia 07/28/2022   Positive colorectal cancer screening using Cologuard test    Abnormal ECG 03/31/2021   Acute non-recurrent sinusitis 11/30/2020   Acute otitis media 11/30/2020   Benign essential HTN 10/31/2018   Frequent PVCs 09/08/2017   A-fib (HCC) 08/27/2015   Clinical depression 08/27/2015   Blood pressure elevated 08/27/2015   Polypharmacy 08/27/2015   Esophagitis, reflux 08/27/2015   Fatigue 08/27/2015   Acid reflux 08/27/2015   HLD (hyperlipidemia) 08/27/2015   Adult hypothyroidism 08/27/2015   Adaptive colitis 08/27/2015   Malaise and fatigue 08/27/2015   Mild major depression (HCC) 08/27/2015   Polymyalgia rheumatica (HCC) 08/27/2015   Cranial arteritis (HCC) 08/27/2015   Avitaminosis  D 08/27/2015     Grier Rocher PT, DPT  Physical Therapist - [place]  [facility]  4:42 PM 02/27/24

## 2024-03-04 NOTE — Therapy (Signed)
 [facility] Outpatient Rehabilitation at [facility] [address] [place], [place], [address] Phone: [phone]   Fax:  [phone]  Outpatient Physical Therapy Treatment/Physical Therapy Progress Note   Dates of reporting period  12/01/2023   to   03/05/2024      Patient Details  Name: Tara Marquez MRN: [id number] Date of Birth: [birth year] Referring Provider (PT): Julieanne Manson MD (PCP)   Encounter Date: 03/05/2024   PT End of Session - 03/05/24 0939     Visit Number 40    Number of Visits 55    Date for PT Re-Evaluation 04/23/24    Authorization Type Medicare A&B primary with BCBS supplemental secondary    Authorization Time Period 07/14/23-09/08/23    Progress Note Due on Visit 50    PT Start Time 0940    PT Stop Time 1015    PT Time Calculation (min) 35 min    Equipment Utilized During Treatment Gait belt    Activity Tolerance Patient tolerated treatment well    Behavior During Therapy WFL for tasks assessed/performed                            Past Medical History:  Diagnosis Date   Acute pyelonephritis    Adaptive colitis    Anemia    Aortic atherosclerosis (HCC)    Atrial fibrillation and flutter (HCC)    a.) CHA2DS2VASc = 5 (age x 2, sex, HTN, vascular disease history);  b.) rate/rhythm maintained on oral amiodarone + metoprolol succinate; chronically anticoagulated with dose reduced apixaban   CAD (coronary artery disease)    Depression    Descending thoracic aortic aneurysm (HCC)    a.) CT chest 09/29/2022: 4.9 cm; b.) CT abd 01/13/2023: 4.9 cm   Frequent PVCs    GERD (gastroesophageal reflux disease)    Hepatic steatosis    History of 2019 novel coronavirus disease (COVID-19) 12/04/2021   Hyperlipidemia    Hypertension    Hypokalemia    Hypothyroidism    Long term current use of amiodarone    Long term current use of anticoagulant    a.) dose reduced apixaban   Nephrolithiasis    Osteopenia    Polymyalgia  rheumatica (HCC)    Skin cancer    Splenomegaly    Temporal arteritis (HCC)    Vitamin D deficiency     Past Surgical History:  Procedure Laterality Date   ABDOMINAL HYSTERECTOMY  03/1971   Status Post Hysterectomy   CATARACT EXTRACTION Left    COLONOSCOPY     2010   COLONOSCOPY WITH PROPOFOL N/A 01/12/2022   Procedure: COLONOSCOPY WITH PROPOFOL;  Surgeon: Midge Minium, MD;  Location: [facility] ENDOSCOPY;  Service: Endoscopy;  Laterality: N/A;   CYSTOSCOPY WITH RETROGRADE PYELOGRAM, URETEROSCOPY AND STENT PLACEMENT Left 01/13/2023   Procedure: CYSTOSCOPY WITH RETROGRADE PYELOGRAM, URETEROSCOPY AND STENT PLACEMENT;  Surgeon: Riki Altes, MD;  Location: [facility] ORS;  Service: Urology;  Laterality: Left;   CYSTOSCOPY/URETEROSCOPY/HOLMIUM LASER/STENT PLACEMENT Left 02/08/2023   Procedure: CYSTOSCOPY/URETEROSCOPY/HOLMIUM LASER/STENT EXCHANGE;  Surgeon: Riki Altes, MD;  Location: [facility] ORS;  Service: Urology;  Laterality: Left;   ESOPHAGOGASTRODUODENOSCOPY N/A 11/02/2022   Procedure: ESOPHAGOGASTRODUODENOSCOPY (EGD);  Surgeon: Midge Minium, MD;  Location: [facility] ENDOSCOPY;  Service: Endoscopy;  Laterality: N/A;   EYE SURGERY     GIVENS CAPSULE STUDY N/A 12/22/2022   Procedure: GIVENS CAPSULE STUDY;  Surgeon: Midge Minium, MD;  Location: [facility] ENDOSCOPY;  Service: Endoscopy;  Laterality: N/A;   TONSILLECTOMY     TONSILLECTOMY AND ADENOIDECTOMY  1951   WISDOM TOOTH EXTRACTION  1970       Subjective Assessment     Subjective  Patient reports she lost her cane at church and using her quad cane today.   Pertinent History Tara Marquez is an 82yoF who is referred by PCP due to gradual progressive weakness since hospital admission in February 2024. Pt presents to PT eval with husband. Pt reports being in hospital for ~ a week in Feb 2024 with urinary obstruction, AKI, felt week initially but declined HHPT; pt/husband say progressively more weak over subsequent months, more difficulty with walking,  husband has now asked her to use a cane. Pt reports 5 falls, 4 of which were outside on natural surface 5th trying to clear a threshold (posterior fall buttock, back, hit head). Does get dizzy, but not frequently, not related to falls often.    How long can you sit comfortably? Not a limitation    How long can you stand comfortably? Not a limitation    How long can you walk comfortably? Mostly walks household distances, but can tolerate occasional store trips with buggy support.    Currently in Pain? No/denies               Objective measurements completed on examination: See above findings.    TODAY'S TREATMENT: 03/05/24  Self care/Home management:   Sit to stand x 10 reps without UE Support Resistive Hip abd RTB x length of // bars and back- x 4 Forward lunge alt LE x 10 reps Standing hip ext alt LE x 10 reps  Standing calf raises BLE x 10 reps  Standing tandem standing x up to 30 sec multiple attempts Standing single leg stance- multiple attempts  Slow dynamic standing march - Alt LE x 15 reps  CGA with all interventions today.    PT Education -     Education Details Exercise technique- VC for safety    Safety in management of unlevel sidewalk to imrprove step height                      PT Short Term Goals -        PT SHORT TERM GOAL #1   Title compliant with balance HEP    Time 4    Period Weeks    Status Achieved    Target Date 08/12/23      PT SHORT TERM GOAL #2   Title Improved overground gait speed over 532ft to improve ability to access limited community distances safely.    Baseline eval: 391ft at 0.38m/s; 686ft s device 0.72m/s.   Time 4    Period Weeks    Status Achieved     Target Date 08/12/23               PT Long Term Goals -       PT LONG TERM GOAL #1   Title FOTO score improvement >12 points to indicate improve confidence in balance.    Baseline 07/19/2023=43; 08/22/23: 51; 09/08/2023 =will assess next visit; 10/22= 52;  12/01/2023= 57   Time 12   Period Weeks    Status MET   Target Date 12/01/2023     PT LONG TERM GOAL #2   Title Improvement in standardized balance assessment by MCID or greater.    Baseline 07/19/2023= BERG- 35/56; 08/22/23: 44   Time 8    Period  Weeks    Status Achieved     Target Date 09/09/23      PT LONG TERM GOAL #3   Title Pt to demonstrate 5xSTS hands free from standard height surface to indicated improved BLE power.    Baseline 07/19/2023= 31.07 sec without UE Support; 08/22/23: 19.15sec (hands on thighs); 09/08/2023= 16.42 sec; 12/01/2023= 14.42 sec without UE support    Time 8    Period Weeks    Status MET   Target Date 09/09/23      PT LONG TERM GOAL #4   Title Pt to demonstrate performance of 2045ft AMB >0.72m/s without increased pain and without LOB at modI level or less to restore ability to access comunity distances for IADL.    Baseline 08/22/23: 656ft in 4:02 (0.74m/s); 08/25/2023= 1200 in . 9/12= 0.62 m/s; 12/01/2023= will reassess next visit 02/27/2024: 1577ft in 11:31min with SPC    Time 8    Period Weeks    Status Ongoing    Target Date 04/23/2024          PT LONG TERM GOAL #5  Title Pt will improve BERG by at least 3 points in order to demonstrate clinically significant improvement in balance.   Baseline 09/08/2023 = 46/56; 12/01/2023= 47/56 02/27/2024: 48   Time 12  Period Weeks   Status PROGRESSING  Target Date 04/23/2024      PT LONG TERM GOAL #6  Title  Pt will decrease 5TSTS by at least 3 seconds in order to demonstrate clinically significant improvement in LE strength.  Baseline 09/08/2023= 16.42 sec 12/01/2023= 14.42 sec without UE support  02/27/2024: 14.74 sec without UE support   Time 12  Period Weeks   Status  PROGRESSING  Target Date 04/23/2024        Plan -     Clinical Impression Statement Patient presents with good motivation- asking for more clarification on HEP and new handout. Patient performed some LE Strengthening with only fatigue  as limiting factor. She was challenged with all tandem and SLS activities and these activities were added to HEP. Goals were not directly assessed again other than HEP goal secondary to all just addressed last visit.  Pt will benefit from continued PT services to address and improve her functional strength and mobility to assist in return to previous level of function.    Personal Factors and Comorbidities Age;Fitness;Behavior Pattern;Past/Current Experience    Examination-Activity Limitations Locomotion Level;Transfers;Lift;Stairs    Examination-Participation Restrictions Meal Prep;Cleaning;Community Activity;Driving;Interpersonal Relationship;Yard Work    Stability/Clinical Decision Making Stable/Uncomplicated    Clinical Decision Making Moderate    Rehab Potential Good    PT Frequency 1-2x / week    PT Duration 12 weeks    PT Treatment/Interventions ADLs/Self Care Home Management;Gait training;Stair training;Functional mobility training;Neuromuscular re-education;Balance training;Therapeutic exercise;Therapeutic activities;Passive range of motion;Manual techniques    PT Next Visit Plan Continue with balance and therex  for improved strength and balance         PT Home Exercise Plan asked at eval to check and log BP twice daily,   Access Code: [email or web address] URL: [email or web address] Date: 03/05/2024 Prepared by: Maureen Ralphs  Exercises - Sit to Stand with Arms Crossed  - 3 x weekly - 3 sets - 10 reps - Side Stepping with Resistance at Ankles and Counter Support  - 3 x weekly - 3 sets - Forward Lunge with Counter Support  - 3 x weekly - 3 sets - 10 reps - Standing Hip Extension with Counter Support  -  3 x weekly - 3 sets - 10 reps - Standing Heel Raises  - 3 x weekly - 3 sets - 10 reps - Standing Single Leg Stance with Counter Support  - 3 x weekly - 5-10 sets - as long as possible hold - Tandem Stance  - 3 x weekly - 3-5 sets - up to 30 sec hold - March in Place  - 3 x  weekly - 3 sets - 10 reps  Access Code: [email or web address] URL: [email or web address] Date: 07/21/2023 Prepared by: Grier Rocher  Exercises - Sit to Stand with Armchair  - 1 x daily - 5 x weekly - 2 sets - 6 reps - Standing Hip Abduction with Counter Support  - 1 x daily - 7 x weekly - 3 sets - 10 reps - Seated Hip Abduction with Resistance  - 1 x daily - 7 x weekly - 3 sets - 10 reps - Seated March with Resistance  - 1 x daily - 7 x weekly - 3 sets - 10 reps   Consulted and Agree with Plan of Care Patient;Family member/caregiver             Patient will benefit from skilled therapeutic intervention in order to improve the following deficits and impairments:   balance impairments, endurance deficits, reduced strength,   Visit Diagnosis: Muscle weakness (generalized)  Difficulty in walking, not elsewhere classified  Unsteadiness on feet  Repeated falls     Problem List Patient Active Problem List   Diagnosis Date Noted   Obstructive nephropathy 01/13/2023   Acute pyelonephritis 01/13/2023   AKI (acute kidney injury) (HCC) 01/13/2023   Hypokalemia 01/13/2023   Diarrhea 01/13/2023   Thoracic aortic aneurysm without rupture (HCC) 11/09/2022   Hematuria 11/05/2022   Polyp of esophagus 11/02/2022   Iron deficiency anemia 07/28/2022   Positive colorectal cancer screening using Cologuard test    Abnormal ECG 03/31/2021   Acute non-recurrent sinusitis 11/30/2020   Acute otitis media 11/30/2020   Benign essential HTN 10/31/2018   Frequent PVCs 09/08/2017   A-fib (HCC) 08/27/2015   Clinical depression 08/27/2015   Blood pressure elevated 08/27/2015   Polypharmacy 08/27/2015   Esophagitis, reflux 08/27/2015   Fatigue 08/27/2015   Acid reflux 08/27/2015   HLD (hyperlipidemia) 08/27/2015   Adult hypothyroidism 08/27/2015   Adaptive colitis 08/27/2015   Malaise and fatigue 08/27/2015   Mild major depression (HCC) 08/27/2015   Polymyalgia rheumatica (HCC)  08/27/2015   Cranial arteritis (HCC) 08/27/2015   Avitaminosis D 08/27/2015     Louis Meckel, PT  Physical Therapist - [place]  [facility]  1:25 PM 03/05/24

## 2024-03-05 ENCOUNTER — Ambulatory Visit: Payer: Medicare Other

## 2024-03-05 ENCOUNTER — Telehealth (INDEPENDENT_AMBULATORY_CARE_PROVIDER_SITE_OTHER): Payer: Self-pay | Admitting: Vascular Surgery

## 2024-03-05 DIAGNOSIS — R262 Difficulty in walking, not elsewhere classified: Secondary | ICD-10-CM

## 2024-03-05 DIAGNOSIS — M6281 Muscle weakness (generalized): Secondary | ICD-10-CM | POA: Diagnosis not present

## 2024-03-05 DIAGNOSIS — R2681 Unsteadiness on feet: Secondary | ICD-10-CM

## 2024-03-05 DIAGNOSIS — R296 Repeated falls: Secondary | ICD-10-CM

## 2024-03-05 NOTE — Telephone Encounter (Signed)
 LVM for pt TCB and talk to me about scheduling the CT ordered by Dr. Wyn Quaker. Patient will need to call Radiology scheduling and make the appt [phone]) and then call us bak to make a CT results appt with JD.

## 2024-03-12 ENCOUNTER — Ambulatory Visit: Payer: Medicare Other

## 2024-03-12 DIAGNOSIS — R262 Difficulty in walking, not elsewhere classified: Secondary | ICD-10-CM

## 2024-03-12 DIAGNOSIS — M6281 Muscle weakness (generalized): Secondary | ICD-10-CM

## 2024-03-12 DIAGNOSIS — R296 Repeated falls: Secondary | ICD-10-CM

## 2024-03-12 DIAGNOSIS — R2681 Unsteadiness on feet: Secondary | ICD-10-CM

## 2024-03-12 NOTE — Therapy (Signed)
 [facility] at [facility] [address] [place], [place], [address] Phone: [phone]   Fax:  [phone]  Outpatient Physical Therapy Treatment     Patient Details  Name: Tara Marquez MRN: [id number] Date of Birth: 08-Dec-1941 Referring Provider (PT): Julieanne Manson MD (PCP)   Encounter Date: 03/12/2024   PT End of Session - 03/12/24 1450     Visit Number 41    Number of Visits 55    Date for PT Re-Evaluation 04/23/24    Authorization Type Medicare A&B primary with BCBS supplemental secondary    Authorization Time Period 07/14/23-09/08/23    Progress Note Due on Visit 50    PT Start Time 1447    PT Stop Time 1529    PT Time Calculation (min) 42 min    Equipment Utilized During Treatment Gait belt    Activity Tolerance Patient tolerated treatment well    Behavior During Therapy WFL for tasks assessed/performed                             Past Medical History:  Diagnosis Date   Acute pyelonephritis    Adaptive colitis    Anemia    Aortic atherosclerosis (HCC)    Atrial fibrillation and flutter (HCC)    a.) CHA2DS2VASc = 5 (age x 2, sex, HTN, vascular disease history);  b.) rate/rhythm maintained on oral amiodarone + metoprolol succinate; chronically anticoagulated with dose reduced apixaban   CAD (coronary artery disease)    Depression    Descending thoracic aortic aneurysm (HCC)    a.) CT chest 09/29/2022: 4.9 cm; b.) CT abd 01/13/2023: 4.9 cm   Frequent PVCs    GERD (gastroesophageal reflux disease)    Hepatic steatosis    History of 2019 novel coronavirus disease (COVID-19) 12/04/2021   Hyperlipidemia    Hypertension    Hypokalemia    Hypothyroidism    Long term current use of amiodarone    Long term current use of anticoagulant    a.) dose reduced apixaban   Nephrolithiasis    Osteopenia    Polymyalgia rheumatica (HCC)    Skin cancer    Splenomegaly    Temporal arteritis (HCC)     Vitamin D deficiency     Past Surgical History:  Procedure Laterality Date   ABDOMINAL HYSTERECTOMY  03/1971   Status Post Hysterectomy   CATARACT EXTRACTION Left    COLONOSCOPY     2010   COLONOSCOPY WITH PROPOFOL N/A 01/12/2022   Procedure: COLONOSCOPY WITH PROPOFOL;  Surgeon: Midge Minium, MD;  Location: [facility] ENDOSCOPY;  Service: Endoscopy;  Laterality: N/A;   CYSTOSCOPY WITH RETROGRADE PYELOGRAM, URETEROSCOPY AND STENT PLACEMENT Left 01/13/2023   Procedure: CYSTOSCOPY WITH RETROGRADE PYELOGRAM, URETEROSCOPY AND STENT PLACEMENT;  Surgeon: Riki Altes, MD;  Location: [facility] ORS;  Service: Urology;  Laterality: Left;   CYSTOSCOPY/URETEROSCOPY/HOLMIUM LASER/STENT PLACEMENT Left 02/08/2023   Procedure: CYSTOSCOPY/URETEROSCOPY/HOLMIUM LASER/STENT EXCHANGE;  Surgeon: Riki Altes, MD;  Location: [facility] ORS;  Service: Urology;  Laterality: Left;   ESOPHAGOGASTRODUODENOSCOPY N/A 11/02/2022   Procedure: ESOPHAGOGASTRODUODENOSCOPY (EGD);  Surgeon: Midge Minium, MD;  Location: [facility] ENDOSCOPY;  Service: Endoscopy;  Laterality: N/A;   EYE SURGERY     GIVENS CAPSULE STUDY N/A 12/22/2022   Procedure: GIVENS CAPSULE STUDY;  Surgeon: Midge Minium, MD;  Location: [facility] ENDOSCOPY;  Service: Endoscopy;  Laterality: N/A;   TONSILLECTOMY     TONSILLECTOMY AND ADENOIDECTOMY  1951   WISDOM  TOOTH EXTRACTION  1970       Subjective Assessment     Subjective  Patient reports she did find her cane at church. States having some low back soreness today.    Pertinent History Jessabelle Markiewicz is an 82yoF who is referred by PCP due to gradual progressive weakness since hospital admission in February 2024. Pt presents to PT eval with husband. Pt reports being in hospital for ~ a week in Feb 2024 with urinary obstruction, AKI, felt week initially but declined HHPT; pt/husband say progressively more weak over subsequent months, more difficulty with walking, husband has now asked her to use a cane. Pt reports 5 falls, 4 of  which were outside on natural surface 5th trying to clear a threshold (posterior fall buttock, back, hit head). Does get dizzy, but not frequently, not related to falls often.    How long can you sit comfortably? Not a limitation    How long can you stand comfortably? Not a limitation    How long can you walk comfortably? Mostly walks household distances, but can tolerate occasional store trips with buggy support.    Currently in Pain? No/denies               Objective measurements completed on examination: See above findings.    TODAY'S TREATMENT: 03/12/24   Therapeutic activities:   Seated hip march (active) alt LE x 20 reps each Standing hip ext= alt LE x 20 reps each Ambulation in clinic approx 120 feet - focusing on increased step length  Seated knee ext alt LE x 20 reps Standing Mini squat  x 20 reps Ambulation in clinic approx 120 feet- Focusing on posture  Seated hip swing up/over 1/2 foam roll - 20 reps alt LE Standing ham curl alt LE x 20 reps  Ambulation in clinic approx 120 feet   Sit to stand - 2 sets of 10 reps  Standing calf raises x 20 reps BLE  Ambulation in clinic approx 120 feet    CGA with all interventions today.    PT Education -     Education Details Exercise technique- VC for safety    Safety in management of unlevel sidewalk to imrprove step height                      PT Short Term Goals -        PT SHORT TERM GOAL #1   Title compliant with balance HEP    Time 4    Period Weeks    Status Achieved    Target Date 08/12/23      PT SHORT TERM GOAL #2   Title Improved overground gait speed over 536ft to improve ability to access limited community distances safely.    Baseline eval: 362ft at 0.75m/s; 669ft s device 0.19m/s.   Time 4    Period Weeks    Status Achieved     Target Date 08/12/23               PT Long Term Goals -       PT LONG TERM GOAL #1   Title FOTO score improvement >12 points to indicate improve  confidence in balance.    Baseline 07/19/2023=43; 08/22/23: 51; 09/08/2023 =will assess next visit; 10/22= 52; 12/01/2023= 57   Time 12   Period Weeks    Status MET   Target Date 12/01/2023     PT LONG TERM GOAL #2   Title Improvement  in standardized balance assessment by MCID or greater.    Baseline 07/19/2023= BERG- 35/56; 08/22/23: 44   Time 8    Period Weeks    Status Achieved     Target Date 09/09/23      PT LONG TERM GOAL #3   Title Pt to demonstrate 5xSTS hands free from standard height surface to indicated improved BLE power.    Baseline 07/19/2023= 31.07 sec without UE Support; 08/22/23: 19.15sec (hands on thighs); 09/08/2023= 16.42 sec; 12/01/2023= 14.42 sec without UE support    Time 8    Period Weeks    Status MET   Target Date 09/09/23      PT LONG TERM GOAL #4   Title Pt to demonstrate performance of 2065ft AMB >0.22m/s without increased pain and without LOB at modI level or less to restore ability to access comunity distances for IADL.    Baseline 08/22/23: 668ft in 4:02 (0.68m/s); 08/25/2023= 1200 in . 9/12= 0.62 m/s; 12/01/2023= will reassess next visit 02/27/2024: 1576ft in 11:65min with SPC    Time 8    Period Weeks    Status Ongoing    Target Date 04/23/2024          PT LONG TERM GOAL #5  Title Pt will improve BERG by at least 3 points in order to demonstrate clinically significant improvement in balance.   Baseline 09/08/2023 = 46/56; 12/01/2023= 47/56 02/27/2024: 48   Time 12  Period Weeks   Status PROGRESSING  Target Date 04/23/2024      PT LONG TERM GOAL #6  Title  Pt will decrease 5TSTS by at least 3 seconds in order to demonstrate clinically significant improvement in LE strength.  Baseline 09/08/2023= 16.42 sec 12/01/2023= 14.42 sec without UE support  02/27/2024: 14.74 sec without UE support   Time 12  Period Weeks   Status  PROGRESSING  Target Date 04/23/2024        Plan -     Clinical Impression Statement Patient presents with good motivation for  today's session. She presents with some fatigue with circuit style workout yet able to progress through each with good overall form and able to complete all tasks today with minimal rest. Continues to require cues for safe mobility (erect standing and improved step length)  Pt will benefit from continued PT services to address and improve her functional strength and mobility to assist in return to previous level of function.    Personal Factors and Comorbidities Age;Fitness;Behavior Pattern;Past/Current Experience    Examination-Activity Limitations Locomotion Level;Transfers;Lift;Stairs    Examination-Participation Restrictions Meal Prep;Cleaning;Community Activity;Driving;Interpersonal Relationship;Yard Work    Stability/Clinical Decision Making Stable/Uncomplicated    Clinical Decision Making Moderate    Rehab Potential Good    PT Frequency 1-2x / week    PT Duration 12 weeks    PT Treatment/Interventions ADLs/Self Care Home Management;Gait training;Stair training;Functional mobility training;Neuromuscular re-education;Balance training;Therapeutic exercise;Therapeutic activities;Passive range of motion;Manual techniques    PT Next Visit Plan Continue with balance and therex  for improved strength and balance         PT Home Exercise Plan asked at eval to check and log BP twice daily,   Access Code: [email or web address] URL: [email or web address] Date: 03/05/2024 Prepared by: Maureen Ralphs  Exercises - Sit to Stand with Arms Crossed  - 3 x weekly - 3 sets - 10 reps - Side Stepping with Resistance at Ankles and Counter Support  - 3 x weekly - 3 sets - Forward Lunge with Counter Support  - 3  x weekly - 3 sets - 10 reps - Standing Hip Extension with Counter Support  - 3 x weekly - 3 sets - 10 reps - Standing Heel Raises  - 3 x weekly - 3 sets - 10 reps - Standing Single Leg Stance with Counter Support  - 3 x weekly - 5-10 sets - as long as possible hold - Tandem Stance  - 3 x  weekly - 3-5 sets - up to 30 sec hold - March in Place  - 3 x weekly - 3 sets - 10 reps  Access Code: [email or web address] URL: [email or web address] Date: 07/21/2023 Prepared by: Grier Rocher  Exercises - Sit to Stand with Armchair  - 1 x daily - 5 x weekly - 2 sets - 6 reps - Standing Hip Abduction with Counter Support  - 1 x daily - 7 x weekly - 3 sets - 10 reps - Seated Hip Abduction with Resistance  - 1 x daily - 7 x weekly - 3 sets - 10 reps - Seated March with Resistance  - 1 x daily - 7 x weekly - 3 sets - 10 reps   Consulted and Agree with Plan of Care Patient;Family member/caregiver             Patient will benefit from skilled therapeutic intervention in order to improve the following deficits and impairments:   balance impairments, endurance deficits, reduced strength,   Visit Diagnosis: Muscle weakness (generalized)  Difficulty in walking, not elsewhere classified  Unsteadiness on feet  Repeated falls     Problem List Patient Active Problem List   Diagnosis Date Noted   Obstructive nephropathy 01/13/2023   Acute pyelonephritis 01/13/2023   AKI (acute kidney injury) (HCC) 01/13/2023   Hypokalemia 01/13/2023   Diarrhea 01/13/2023   Thoracic aortic aneurysm without rupture (HCC) 11/09/2022   Hematuria 11/05/2022   Polyp of esophagus 11/02/2022   Iron deficiency anemia 07/28/2022   Positive colorectal cancer screening using Cologuard test    Abnormal ECG 03/31/2021   Acute non-recurrent sinusitis 11/30/2020   Acute otitis media 11/30/2020   Benign essential HTN 10/31/2018   Frequent PVCs 09/08/2017   A-fib (HCC) 08/27/2015   Clinical depression 08/27/2015   Blood pressure elevated 08/27/2015   Polypharmacy 08/27/2015   Esophagitis, reflux 08/27/2015   Fatigue 08/27/2015   Acid reflux 08/27/2015   HLD (hyperlipidemia) 08/27/2015   Adult hypothyroidism 08/27/2015   Adaptive colitis 08/27/2015   Malaise and fatigue 08/27/2015   Mild major  depression (HCC) 08/27/2015   Polymyalgia rheumatica (HCC) 08/27/2015   Cranial arteritis (HCC) 08/27/2015   Avitaminosis D 08/27/2015     Louis Meckel, PT  Physical Therapist - [place]  [facility]  3:34 PM 03/12/24

## 2024-03-13 ENCOUNTER — Telehealth (INDEPENDENT_AMBULATORY_CARE_PROVIDER_SITE_OTHER): Payer: Self-pay | Admitting: Vascular Surgery

## 2024-03-13 NOTE — Telephone Encounter (Signed)
 LVM for pt advising that she has a CT ordered by Dr. Wyn Quaker that she needs to have. I advised to call the office and I iwll provide the instructions for her.

## 2024-03-19 ENCOUNTER — Ambulatory Visit: Payer: Medicare Other

## 2024-03-20 ENCOUNTER — Ambulatory Visit: Payer: Medicare Other | Admitting: Physical Therapy

## 2024-03-20 DIAGNOSIS — R2681 Unsteadiness on feet: Secondary | ICD-10-CM

## 2024-03-20 DIAGNOSIS — M6281 Muscle weakness (generalized): Secondary | ICD-10-CM | POA: Diagnosis not present

## 2024-03-20 DIAGNOSIS — R262 Difficulty in walking, not elsewhere classified: Secondary | ICD-10-CM

## 2024-03-20 DIAGNOSIS — R296 Repeated falls: Secondary | ICD-10-CM

## 2024-03-20 NOTE — Therapy (Unsigned)
 [facility] at [facility] [address] [place], [place], [address] Phone: [phone]   Fax:  [phone]  Outpatient Physical Therapy Treatment     Patient Details  Name: Tara Marquez MRN: [id number] Date of Birth: Jul 15, 1941 Referring Provider (PT): Julieanne Manson MD (PCP)   Encounter Date: 03/20/2024   PT End of Session - 03/20/24 1616     Visit Number 42    Number of Visits 55    Date for PT Re-Evaluation 04/23/24    Authorization Type Medicare A&B primary with BCBS supplemental secondary    Authorization Time Period 07/14/23-09/08/23    Progress Note Due on Visit 50    PT Start Time 1617    PT Stop Time 1700    PT Time Calculation (min) 43 min    Equipment Utilized During Treatment Gait belt    Activity Tolerance Patient tolerated treatment well    Behavior During Therapy WFL for tasks assessed/performed                             Past Medical History:  Diagnosis Date   Acute pyelonephritis    Adaptive colitis    Anemia    Aortic atherosclerosis (HCC)    Atrial fibrillation and flutter (HCC)    a.) CHA2DS2VASc = 5 (age x 2, sex, HTN, vascular disease history);  b.) rate/rhythm maintained on oral amiodarone + metoprolol succinate; chronically anticoagulated with dose reduced apixaban   CAD (coronary artery disease)    Depression    Descending thoracic aortic aneurysm (HCC)    a.) CT chest 09/29/2022: 4.9 cm; b.) CT abd 01/13/2023: 4.9 cm   Frequent PVCs    GERD (gastroesophageal reflux disease)    Hepatic steatosis    History of 2019 novel coronavirus disease (COVID-19) 12/04/2021   Hyperlipidemia    Hypertension    Hypokalemia    Hypothyroidism    Long term current use of amiodarone    Long term current use of anticoagulant    a.) dose reduced apixaban   Nephrolithiasis    Osteopenia    Polymyalgia rheumatica (HCC)    Skin cancer    Splenomegaly    Temporal arteritis (HCC)     Vitamin D deficiency     Past Surgical History:  Procedure Laterality Date   ABDOMINAL HYSTERECTOMY  03/1971   Status Post Hysterectomy   CATARACT EXTRACTION Left    COLONOSCOPY     2010   COLONOSCOPY WITH PROPOFOL N/A 01/12/2022   Procedure: COLONOSCOPY WITH PROPOFOL;  Surgeon: Midge Minium, MD;  Location: [facility] ENDOSCOPY;  Service: Endoscopy;  Laterality: N/A;   CYSTOSCOPY WITH RETROGRADE PYELOGRAM, URETEROSCOPY AND STENT PLACEMENT Left 01/13/2023   Procedure: CYSTOSCOPY WITH RETROGRADE PYELOGRAM, URETEROSCOPY AND STENT PLACEMENT;  Surgeon: Riki Altes, MD;  Location: [facility] ORS;  Service: Urology;  Laterality: Left;   CYSTOSCOPY/URETEROSCOPY/HOLMIUM LASER/STENT PLACEMENT Left 02/08/2023   Procedure: CYSTOSCOPY/URETEROSCOPY/HOLMIUM LASER/STENT EXCHANGE;  Surgeon: Riki Altes, MD;  Location: [facility] ORS;  Service: Urology;  Laterality: Left;   ESOPHAGOGASTRODUODENOSCOPY N/A 11/02/2022   Procedure: ESOPHAGOGASTRODUODENOSCOPY (EGD);  Surgeon: Midge Minium, MD;  Location: [facility] ENDOSCOPY;  Service: Endoscopy;  Laterality: N/A;   EYE SURGERY     GIVENS CAPSULE STUDY N/A 12/22/2022   Procedure: GIVENS CAPSULE STUDY;  Surgeon: Midge Minium, MD;  Location: [facility] ENDOSCOPY;  Service: Endoscopy;  Laterality: N/A;   TONSILLECTOMY     TONSILLECTOMY AND ADENOIDECTOMY  1951   WISDOM  TOOTH EXTRACTION  1970       Subjective Assessment     Subjective  Patient reports she is having a good day. No updates.    Pertinent History Tara Marquez is an 82yoF who is referred by PCP due to gradual progressive weakness since hospital admission in February 2024. Pt presents to PT eval with husband. Pt reports being in hospital for ~ a week in Feb 2024 with urinary obstruction, AKI, felt week initially but declined HHPT; pt/husband say progressively more weak over subsequent months, more difficulty with walking, husband has now asked her to use a cane. Pt reports 5 falls, 4 of which were outside on natural surface  5th trying to clear a threshold (posterior fall buttock, back, hit head). Does get dizzy, but not frequently, not related to falls often.    How long can you sit comfortably? Not a limitation    How long can you stand comfortably? Not a limitation    How long can you walk comfortably? Mostly walks household distances, but can tolerate occasional store trips with buggy support.    Currently in Pain? No/denies               Objective measurements completed on examination: See above findings.    TODAY'S TREATMENT: 03/20/24   Therapeutic activities:  Sit to stand - 2 sets of 10 reps  Nustep reciprocal movement training x 6 min, level   Seated hip march (active) alt LE x 20 reps each Standing hip ext alt LE x 15 reps each Ambulation in clinic approx 120 feet - focusing on increased step length Forward step over cane with weight shift into front LE. X 12  Lateral step over cane with weigh shift into lead LE x 12  Standing squats at rail x 15  Ambulation in clinic approx 150 feet- Focusing on posture and improved step length/heel contact.  Standing calf raises x 20 reps BLE  Ambulation in clinic approx 120 feet    CGA/supervision with all interventions today.    PT Education -     Education Details Exercise technique- VC for safety    Safety in management of unlevel sidewalk to imrprove step height                      PT Short Term Goals -        PT SHORT TERM GOAL #1   Title compliant with balance HEP    Time 4    Period Weeks    Status Achieved    Target Date 08/12/23      PT SHORT TERM GOAL #2   Title Improved overground gait speed over 55ft to improve ability to access limited community distances safely.    Baseline eval: 354ft at 0.1m/s; 650ft s device 0.73m/s.   Time 4    Period Weeks    Status Achieved     Target Date 08/12/23               PT Long Term Goals -       PT LONG TERM GOAL #1   Title FOTO score improvement >12 points to  indicate improve confidence in balance.    Baseline 07/19/2023=43; 08/22/23: 51; 09/08/2023 =will assess next visit; 10/22= 52; 12/01/2023= 57   Time 12   Period Weeks    Status MET   Target Date 12/01/2023     PT LONG TERM GOAL #2   Title Improvement in standardized balance assessment  by MCID or greater.    Baseline 07/19/2023= BERG- 35/56; 08/22/23: 44   Time 8    Period Weeks    Status Achieved     Target Date 09/09/23      PT LONG TERM GOAL #3   Title Pt to demonstrate 5xSTS hands free from standard height surface to indicated improved BLE power.    Baseline 07/19/2023= 31.07 sec without UE Support; 08/22/23: 19.15sec (hands on thighs); 09/08/2023= 16.42 sec; 12/01/2023= 14.42 sec without UE support    Time 8    Period Weeks    Status MET   Target Date 09/09/23      PT LONG TERM GOAL #4   Title Pt to demonstrate performance of 2036ft AMB >0.69m/s without increased pain and without LOB at modI level or less to restore ability to access comunity distances for IADL.    Baseline 08/22/23: 652ft in 4:02 (0.21m/s); 08/25/2023= 1200 in . 9/12= 0.62 m/s; 12/01/2023= will reassess next visit 02/27/2024: 1528ft in 11:30min with SPC    Time 8    Period Weeks    Status Ongoing    Target Date 04/23/2024          PT LONG TERM GOAL #5  Title Pt will improve BERG by at least 3 points in order to demonstrate clinically significant improvement in balance.   Baseline 09/08/2023 = 46/56; 12/01/2023= 47/56 02/27/2024: 48   Time 12  Period Weeks   Status PROGRESSING  Target Date 04/23/2024      PT LONG TERM GOAL #6  Title  Pt will decrease 5TSTS by at least 3 seconds in order to demonstrate clinically significant improvement in LE strength.  Baseline 09/08/2023= 16.42 sec 12/01/2023= 14.42 sec without UE support  02/27/2024: 14.74 sec without UE support   Time 12  Period Weeks   Status  PROGRESSING  Target Date 04/23/2024        Plan -     Clinical Impression Statement Patient presents with  good motivation for today's session. She presents with some fatigue with circuit style workout yet able to progress through each with good overall form and able to complete all tasks today with minimal rest. Continues to require cues for safe mobility (erect standing and improved step length)  Pt will benefit from continued PT services to address and improve her functional strength and mobility to assist in return to previous level of function.    Personal Factors and Comorbidities Age;Fitness;Behavior Pattern;Past/Current Experience    Examination-Activity Limitations Locomotion Level;Transfers;Lift;Stairs    Examination-Participation Restrictions Meal Prep;Cleaning;Community Activity;Driving;Interpersonal Relationship;Yard Work    Stability/Clinical Decision Making Stable/Uncomplicated    Clinical Decision Making Moderate    Rehab Potential Good    PT Frequency 1-2x / week    PT Duration 12 weeks    PT Treatment/Interventions ADLs/Self Care Home Management;Gait training;Stair training;Functional mobility training;Neuromuscular re-education;Balance training;Therapeutic exercise;Therapeutic activities;Passive range of motion;Manual techniques    PT Next Visit Plan Continue with balance and therex  for improved strength and balance         PT Home Exercise Plan asked at eval to check and log BP twice daily,   Access Code: [email or web address] URL: [email or web address] Date: 03/05/2024 Prepared by: Maureen Ralphs  Exercises - Sit to Stand with Arms Crossed  - 3 x weekly - 3 sets - 10 reps - Side Stepping with Resistance at Ankles and Counter Support  - 3 x weekly - 3 sets - Forward Lunge with Counter Support  - 3 x weekly - 3  sets - 10 reps - Standing Hip Extension with Counter Support  - 3 x weekly - 3 sets - 10 reps - Standing Heel Raises  - 3 x weekly - 3 sets - 10 reps - Standing Single Leg Stance with Counter Support  - 3 x weekly - 5-10 sets - as long as possible hold - Tandem  Stance  - 3 x weekly - 3-5 sets - up to 30 sec hold - March in Place  - 3 x weekly - 3 sets - 10 reps  Access Code: [email or web address] URL: [email or web address] Date: 07/21/2023 Prepared by: Grier Rocher  Exercises - Sit to Stand with Armchair  - 1 x daily - 5 x weekly - 2 sets - 6 reps - Standing Hip Abduction with Counter Support  - 1 x daily - 7 x weekly - 3 sets - 10 reps - Seated Hip Abduction with Resistance  - 1 x daily - 7 x weekly - 3 sets - 10 reps - Seated March with Resistance  - 1 x daily - 7 x weekly - 3 sets - 10 reps   Consulted and Agree with Plan of Care Patient;Family member/caregiver             Patient will benefit from skilled therapeutic intervention in order to improve the following deficits and impairments:   balance impairments, endurance deficits, reduced strength,   Visit Diagnosis: Muscle weakness (generalized)  Difficulty in walking, not elsewhere classified  Unsteadiness on feet  Repeated falls     Problem List Patient Active Problem List   Diagnosis Date Noted   Obstructive nephropathy 01/13/2023   Acute pyelonephritis 01/13/2023   AKI (acute kidney injury) (HCC) 01/13/2023   Hypokalemia 01/13/2023   Diarrhea 01/13/2023   Thoracic aortic aneurysm without rupture (HCC) 11/09/2022   Hematuria 11/05/2022   Polyp of esophagus 11/02/2022   Iron deficiency anemia 07/28/2022   Positive colorectal cancer screening using Cologuard test    Abnormal ECG 03/31/2021   Acute non-recurrent sinusitis 11/30/2020   Acute otitis media 11/30/2020   Benign essential HTN 10/31/2018   Frequent PVCs 09/08/2017   A-fib (HCC) 08/27/2015   Clinical depression 08/27/2015   Blood pressure elevated 08/27/2015   Polypharmacy 08/27/2015   Esophagitis, reflux 08/27/2015   Fatigue 08/27/2015   Acid reflux 08/27/2015   HLD (hyperlipidemia) 08/27/2015   Adult hypothyroidism 08/27/2015   Adaptive colitis 08/27/2015   Malaise and fatigue 08/27/2015    Mild major depression (HCC) 08/27/2015   Polymyalgia rheumatica (HCC) 08/27/2015   Cranial arteritis (HCC) 08/27/2015   Avitaminosis D 08/27/2015     Grier Rocher PT, DPT  Physical Therapist - [place]  [facility]  4:17 PM 03/20/24

## 2024-03-22 ENCOUNTER — Telehealth (INDEPENDENT_AMBULATORY_CARE_PROVIDER_SITE_OTHER): Payer: Self-pay

## 2024-03-22 NOTE — Telephone Encounter (Signed)
 Error

## 2024-03-22 NOTE — Telephone Encounter (Addendum)
 Patient call stating she has received her appt for imaging (03/28/24 @ 2:00 pm). She was told to call back for instructions.    Please advise

## 2024-03-26 ENCOUNTER — Ambulatory Visit: Payer: Medicare Other

## 2024-03-27 ENCOUNTER — Ambulatory Visit: Payer: Medicare Other | Attending: Family Medicine | Admitting: Physical Therapy

## 2024-03-27 DIAGNOSIS — M6281 Muscle weakness (generalized): Secondary | ICD-10-CM | POA: Diagnosis present

## 2024-03-27 DIAGNOSIS — R262 Difficulty in walking, not elsewhere classified: Secondary | ICD-10-CM | POA: Diagnosis present

## 2024-03-27 DIAGNOSIS — R296 Repeated falls: Secondary | ICD-10-CM | POA: Diagnosis present

## 2024-03-27 DIAGNOSIS — R2681 Unsteadiness on feet: Secondary | ICD-10-CM | POA: Diagnosis present

## 2024-03-27 NOTE — Therapy (Unsigned)
 [facility] at [facility] [address] [place], [place], [address] Phone: [phone]   Fax:  [phone]  Outpatient Physical Therapy Treatment     Patient Details  Name: Tara Marquez MRN: [id number] Date of Birth: [birth year] Referring Provider (PT): Tara Manson MD (PCP)   Encounter Date: 03/27/2024   PT End of Session - 03/27/24 1619     Visit Number 43    Number of Visits 55    Date for PT Re-Evaluation 04/23/24    Authorization Type Medicare A&B primary with [employer]BS supplemental secondary    Authorization Time Period 07/14/23-09/08/23    Progress Note Due on Visit 50    PT Start Time 1617    PT Stop Time 1700    PT Time Calculation (min) 43 min    Equipment Utilized During Treatment Gait belt    Activity Tolerance Patient tolerated treatment well    Behavior During Therapy WFL for tasks assessed/performed                             Past Medical History:  Diagnosis Date   Acute pyelonephritis    Adaptive colitis    Anemia    Aortic atherosclerosis (HCC)    Atrial fibrillation and flutter (HCC)    a.) CHA2DS2VASc = 5 (age x 2, sex, HTN, vascular disease history);  b.) rate/rhythm maintained on oral amiodarone + metoprolol succinate; chronically anticoagulated with dose reduced apixaban   CAD (coronary artery disease)    Depression    Descending thoracic aortic aneurysm (HCC)    a.) CT chest 09/29/2022: 4.9 cm; b.) CT abd 01/13/2023: 4.9 cm   Frequent PVCs    GERD (gastroesophageal reflux disease)    Hepatic steatosis    History of 2019 novel coronavirus disease (COVID-19) 12/04/2021   Hyperlipidemia    Hypertension    Hypokalemia    Hypothyroidism    Long term current use of amiodarone    Long term current use of anticoagulant    a.) dose reduced apixaban   Nephrolithiasis    Osteopenia    Polymyalgia rheumatica (HCC)    Skin cancer    Splenomegaly    Temporal arteritis (HCC)     Vitamin D deficiency     Past Surgical History:  Procedure Laterality Date   ABDOMINAL HYSTERECTOMY  03/1971   Status Post Hysterectomy   CATARACT EXTRACTION Left    COLONOSCOPY     2010   COLONOSCOPY WITH PROPOFOL N/A 01/12/2022   Procedure: COLONOSCOPY WITH PROPOFOL;  Surgeon: Tara Minium, MD;  Location: [facility] ENDOSCOPY;  Service: Endoscopy;  Laterality: N/A;   CYSTOSCOPY WITH RETROGRADE PYELOGRAM, URETEROSCOPY AND STENT PLACEMENT Left 01/13/2023   Procedure: CYSTOSCOPY WITH RETROGRADE PYELOGRAM, URETEROSCOPY AND STENT PLACEMENT;  Surgeon: Tara Altes, MD;  Location: [facility] ORS;  Service: Urology;  Laterality: Left;   CYSTOSCOPY/URETEROSCOPY/HOLMIUM LASER/STENT PLACEMENT Left 02/08/2023   Procedure: CYSTOSCOPY/URETEROSCOPY/HOLMIUM LASER/STENT EXCHANGE;  Surgeon: Tara Altes, MD;  Location: [facility] ORS;  Service: Urology;  Laterality: Left;   ESOPHAGOGASTRODUODENOSCOPY N/A 11/02/2022   Procedure: ESOPHAGOGASTRODUODENOSCOPY (EGD);  Surgeon: Tara Minium, MD;  Location: [facility] ENDOSCOPY;  Service: Endoscopy;  Laterality: N/A;   EYE SURGERY     GIVENS CAPSULE STUDY N/A 12/22/2022   Procedure: GIVENS CAPSULE STUDY;  Surgeon: Tara Minium, MD;  Location: [facility] ENDOSCOPY;  Service: Endoscopy;  Laterality: N/A;   TONSILLECTOMY     TONSILLECTOMY AND ADENOIDECTOMY  1951   WISDOM  TOOTH EXTRACTION  1970       Subjective Assessment     Subjective  Patient reports she is having a good day. No updates.    Pertinent History [name] is an 82yoF who is referred by PCP due to gradual progressive weakness since hospital admission in February 2024. Pt presents to PT eval with husband. Pt reports being in hospital for ~ a week in Feb 2024 with urinary obstruction, AKI, felt week initially but declined HHPT; pt/husband say progressively more weak over subsequent months, more difficulty with walking, husband has now asked her to use a cane. Pt reports 5 falls, 4 of which were outside on natural surface  5th trying to clear a threshold (posterior fall buttock, back, hit head). Does get dizzy, but not frequently, not related to falls often.    How long can you sit comfortably? Not a limitation    How long can you stand comfortably? Not a limitation    How long can you walk comfortably? Mostly walks household distances, but can tolerate occasional store trips with buggy support.    Currently in Pain? No/denies               Objective measurements completed on examination: See above findings.    TODAY'S TREATMENT: 03/27/24  Nustep level 1-5 x 6 min   Therapeutic activities:  Sit to stand - 2 sets of 10 reps  Nustep reciprocal movement training x 6 min, level  cues for consistent SPM through varied resistance level 1-4.  Seated hip march (active) alt LE x 20 reps each Standing hip ext alt LE x 15 reps each Ambulation in clinic approx 120 feet - focusing on increased step length Forward step over cane with weight shift into front LE. X 12  Lateral step over cane with weigh shift into lead LE x 12  Standing squats at rail x 15  Ambulation in clinic approx 150 feet- Focusing on posture and improved step length/heel contact.  Standing calf raises x 20 reps BLE  Ambulation in clinic approx 120 feet   CGA/supervision with all interventions today. As well as therapeutic rest breaks between PT interventions. Pt noted to have improved step lenggth and sequecning with increased repetition of movement pattern over [facility]    PT Education -     Education Details Exercise technique- VC for safety    Safety in management of unlevel sidewalk to imrprove step height                      PT Short Term Goals -        PT SHORT TERM GOAL #1   Title compliant with balance HEP    Time 4    Period Weeks    Status Achieved    Target Date 08/12/23      PT SHORT TERM GOAL #2   Title Improved overground gait speed over 595ft to improve ability to access limited community distances safely.     Baseline eval: 339ft at 0.56m/s; 673ft s device 0.79m/s.   Time 4    Period Weeks    Status Achieved     Target Date 08/12/23               PT Long Term Goals -       PT LONG TERM GOAL #1   Title FOTO score improvement >12 points to indicate improve confidence in balance.    Baseline 07/19/2023=43; 08/22/23: 51; 09/08/2023 =will assess  next visit; 10/22= 52; 12/01/2023= 57   Time 12   Period Weeks    Status MET   Target Date 12/01/2023     PT LONG TERM GOAL #2   Title Improvement in standardized balance assessment by MCID or greater.    Baseline 07/19/2023= BERG- 35/56; 08/22/23: 44   Time 8    Period Weeks    Status Achieved     Target Date 09/09/23      PT LONG TERM GOAL #3   Title Pt to demonstrate 5xSTS hands free from standard height surface to indicated improved BLE power.    Baseline 07/19/2023= 31.07 sec without UE Support; 08/22/23: 19.15sec (hands on thighs); 09/08/2023= 16.42 sec; 12/01/2023= 14.42 sec without UE support    Time 8    Period Weeks    Status MET   Target Date 09/09/23      PT LONG TERM GOAL #4   Title Pt to demonstrate performance of 2073ft AMB >0.53m/s without increased pain and without LOB at modI level or less to restore ability to access comunity distances for IADL.    Baseline 08/22/23: 626ft in 4:02 (0.36m/s); 08/25/2023= 1200 in . 9/12= 0.62 m/s; 12/01/2023= will reassess next visit 02/27/2024: 1575ft in 11:7min with SPC    Time 8    Period Weeks    Status Ongoing    Target Date 04/23/2024          PT LONG TERM GOAL #5  Title Pt will improve BERG by at least 3 points in order to demonstrate clinically significant improvement in balance.   Baseline 09/08/2023 = 46/56; 12/01/2023= 47/56 02/27/2024: 48   Time 12  Period Weeks   Status PROGRESSING  Target Date 04/23/2024      PT LONG TERM GOAL #6  Title  Pt will decrease 5TSTS by at least 3 seconds in order to demonstrate clinically significant improvement in LE strength.  Baseline  09/08/2023= 16.42 sec 12/01/2023= 14.42 sec without UE support  02/27/2024: 14.74 sec without UE support   Time 12  Period Weeks   Status  PROGRESSING  Target Date 04/23/2024        Plan -     Clinical Impression Statement Patient presents with good motivation for today's session. Pt continues to demonstrate difficulty with weight shift, resulting in decreased step length. Pt noted to have improve step length and weight shift with increased repetitions to step over cane in floor. Therapeutic rest breaks required due to BLE fatigue throughout session. Pt will benefit from continued PT services to address and improve her functional strength and mobility to assist in return to previous level of function.    Personal Factors and Comorbidities Age;Fitness;Behavior Pattern;Past/Current Experience    Examination-Activity Limitations Locomotion Level;Transfers;Lift;Stairs    Examination-Participation Restrictions Meal Prep;Cleaning;Community Activity;Driving;Interpersonal Relationship;Yard Work    Stability/Clinical Decision Making Stable/Uncomplicated    Clinical Decision Making Moderate    Rehab Potential Good    PT Frequency 1-2x / week    PT Duration 12 weeks    PT Treatment/Interventions ADLs/Self Care Home Management;Gait training;Stair training;Functional mobility training;Neuromuscular re-education;Balance training;Therapeutic exercise;Therapeutic activities;Passive range of motion;Manual techniques    PT Next Visit Plan Continue with balance and therex  for improved strength and balance         PT Home Exercise Plan asked at eval to check and log BP twice daily,   Access Code: [email or web address] URL: [email or web address] Date: 03/05/2024 Prepared by: Maureen Ralphs  Exercises - Sit to Stand with Arms Crossed  -  3 x weekly - 3 sets - 10 reps - Side Stepping with Resistance at Ankles and Counter Support  - 3 x weekly - 3 sets - Forward Lunge with Counter Support  - 3 x weekly  - 3 sets - 10 reps - Standing Hip Extension with Counter Support  - 3 x weekly - 3 sets - 10 reps - Standing Heel Raises  - 3 x weekly - 3 sets - 10 reps - Standing Single Leg Stance with Counter Support  - 3 x weekly - 5-10 sets - as long as possible hold - Tandem Stance  - 3 x weekly - 3-5 sets - up to 30 sec hold - March in Place  - 3 x weekly - 3 sets - 10 reps  Access Code: [email or web address] URL: [email or web address] Date: 07/21/2023 Prepared by: Grier Rocher  Exercises - Sit to Stand with Armchair  - 1 x daily - 5 x weekly - 2 sets - 6 reps - Standing Hip Abduction with Counter Support  - 1 x daily - 7 x weekly - 3 sets - 10 reps - Seated Hip Abduction with Resistance  - 1 x daily - 7 x weekly - 3 sets - 10 reps - Seated March with Resistance  - 1 x daily - 7 x weekly - 3 sets - 10 reps   Consulted and Agree with Plan of Care Patient;Family member/caregiver             Patient will benefit from skilled therapeutic intervention in order to improve the following deficits and impairments:   balance impairments, endurance deficits, reduced strength,   Visit Diagnosis: Muscle weakness (generalized)  Difficulty in walking, not elsewhere classified  Unsteadiness on feet  Repeated falls     Problem List Patient Active Problem List   Diagnosis Date Noted   Obstructive nephropathy 01/13/2023   Acute pyelonephritis 01/13/2023   AKI (acute kidney injury) (HCC) 01/13/2023   Hypokalemia 01/13/2023   Diarrhea 01/13/2023   Thoracic aortic aneurysm without rupture (HCC) 11/09/2022   Hematuria 11/05/2022   Polyp of esophagus 11/02/2022   Iron deficiency anemia 07/28/2022   Positive colorectal cancer screening using Cologuard test    Abnormal ECG 03/31/2021   Acute non-recurrent sinusitis 11/30/2020   Acute otitis media 11/30/2020   Benign essential HTN 10/31/2018   Frequent PVCs 09/08/2017   A-fib (HCC) 08/27/2015   Clinical depression 08/27/2015   Blood pressure  elevated 08/27/2015   Polypharmacy 08/27/2015   Esophagitis, reflux 08/27/2015   Fatigue 08/27/2015   Acid reflux 08/27/2015   HLD (hyperlipidemia) 08/27/2015   Adult hypothyroidism 08/27/2015   Adaptive colitis 08/27/2015   Malaise and fatigue 08/27/2015   Mild major depression (HCC) 08/27/2015   Polymyalgia rheumatica (HCC) 08/27/2015   Cranial arteritis (HCC) 08/27/2015   Avitaminosis D 08/27/2015     Grier Rocher PT, DPT  Physical Therapist - [place]  [facility]  4:26 PM 03/27/24

## 2024-03-28 ENCOUNTER — Ambulatory Visit
Admission: RE | Admit: 2024-03-28 | Discharge: 2024-03-28 | Disposition: A | Discharge status: Home / Self Care | Source: Ambulatory Visit | Attending: Vascular Surgery | Admitting: Vascular Surgery

## 2024-03-28 DIAGNOSIS — I7123 Aneurysm of the descending thoracic aorta, without rupture: Secondary | ICD-10-CM | POA: Insufficient documentation

## 2024-03-28 MED ORDER — IOHEXOL 350 MG/ML SOLN
75.0000 mL | Freq: Once | INTRAVENOUS | Status: AC | PRN
  Administered 2024-03-28: 75 mL via INTRAVENOUS

## 2024-04-02 ENCOUNTER — Ambulatory Visit: Payer: Medicare Other

## 2024-04-02 DIAGNOSIS — R296 Repeated falls: Secondary | ICD-10-CM

## 2024-04-02 DIAGNOSIS — R2681 Unsteadiness on feet: Secondary | ICD-10-CM

## 2024-04-02 DIAGNOSIS — M6281 Muscle weakness (generalized): Secondary | ICD-10-CM | POA: Diagnosis not present

## 2024-04-02 DIAGNOSIS — R262 Difficulty in walking, not elsewhere classified: Secondary | ICD-10-CM

## 2024-04-02 NOTE — Therapy (Signed)
 [facility] at [facility] [address] [place], [place], [address] Phone: [phone]   Fax:  [phone]  Outpatient Physical Therapy Treatment     Patient Details  Name: Tara Marquez MRN: [id number] Date of Birth: 1941/11/12 Referring Provider (PT): Julieanne Manson MD (PCP)   Encounter Date: 04/02/2024   PT End of Session - 04/02/24 0929     Visit Number 44    Number of Visits 55    Date for PT Re-Evaluation 04/23/24    Authorization Type Medicare A&B primary with BCBS supplemental secondary    Authorization Time Period 07/14/23-09/08/23    Progress Note Due on Visit 50    PT Start Time 0930    PT Stop Time 1014    PT Time Calculation (min) 44 min    Equipment Utilized During Treatment Gait belt    Activity Tolerance Patient tolerated treatment well    Behavior During Therapy WFL for tasks assessed/performed                              Past Medical History:  Diagnosis Date   Acute pyelonephritis    Adaptive colitis    Anemia    Aortic atherosclerosis (HCC)    Atrial fibrillation and flutter (HCC)    a.) CHA2DS2VASc = 5 (age x 2, sex, HTN, vascular disease history);  b.) rate/rhythm maintained on oral amiodarone + metoprolol succinate; chronically anticoagulated with dose reduced apixaban   CAD (coronary artery disease)    Depression    Descending thoracic aortic aneurysm (HCC)    a.) CT chest 09/29/2022: 4.9 cm; b.) CT abd 01/13/2023: 4.9 cm   Frequent PVCs    GERD (gastroesophageal reflux disease)    Hepatic steatosis    History of 2019 novel coronavirus disease (COVID-19) 12/04/2021   Hyperlipidemia    Hypertension    Hypokalemia    Hypothyroidism    Long term current use of amiodarone    Long term current use of anticoagulant    a.) dose reduced apixaban   Nephrolithiasis    Osteopenia    Polymyalgia rheumatica (HCC)    Skin cancer    Splenomegaly    Temporal arteritis (HCC)     Vitamin D deficiency     Past Surgical History:  Procedure Laterality Date   ABDOMINAL HYSTERECTOMY  03/1971   Status Post Hysterectomy   CATARACT EXTRACTION Left    COLONOSCOPY     2010   COLONOSCOPY WITH PROPOFOL N/A 01/12/2022   Procedure: COLONOSCOPY WITH PROPOFOL;  Surgeon: Midge Minium, MD;  Location: [facility] ENDOSCOPY;  Service: Endoscopy;  Laterality: N/A;   CYSTOSCOPY WITH RETROGRADE PYELOGRAM, URETEROSCOPY AND STENT PLACEMENT Left 01/13/2023   Procedure: CYSTOSCOPY WITH RETROGRADE PYELOGRAM, URETEROSCOPY AND STENT PLACEMENT;  Surgeon: Riki Altes, MD;  Location: [facility] ORS;  Service: Urology;  Laterality: Left;   CYSTOSCOPY/URETEROSCOPY/HOLMIUM LASER/STENT PLACEMENT Left 02/08/2023   Procedure: CYSTOSCOPY/URETEROSCOPY/HOLMIUM LASER/STENT EXCHANGE;  Surgeon: Riki Altes, MD;  Location: [facility] ORS;  Service: Urology;  Laterality: Left;   ESOPHAGOGASTRODUODENOSCOPY N/A 11/02/2022   Procedure: ESOPHAGOGASTRODUODENOSCOPY (EGD);  Surgeon: Midge Minium, MD;  Location: [facility] ENDOSCOPY;  Service: Endoscopy;  Laterality: N/A;   EYE SURGERY     GIVENS CAPSULE STUDY N/A 12/22/2022   Procedure: GIVENS CAPSULE STUDY;  Surgeon: Midge Minium, MD;  Location: [facility]COPY;  Service: Endoscopy;  Laterality: N/A;   TONSILLECTOMY     TONSILLECTOMY AND ADENOIDECTOMY  1951  WISDOM TOOTH EXTRACTION  1970       Subjective Assessment     Subjective  Patient reports she feels like she is doing well and still dealing with some dizziness that she states may be inner ear related.    Pertinent History Riham Polyakov is an 82yoF who is referred by PCP due to gradual progressive weakness since hospital admission in February 2024. Pt presents to PT eval with husband. Pt reports being in hospital for ~ a week in Feb 2024 with urinary obstruction, AKI, felt week initially but declined HHPT; pt/husband say progressively more weak over subsequent months, more difficulty with walking, husband has now asked her to  use a cane. Pt reports 5 falls, 4 of which were outside on natural surface 5th trying to clear a threshold (posterior fall buttock, back, hit head). Does get dizzy, but not frequently, not related to falls often.    How long can you sit comfortably? Not a limitation    How long can you stand comfortably? Not a limitation    How long can you walk comfortably? Mostly walks household distances, but can tolerate occasional store trips with buggy support.    Currently in Pain? No/denies               Objective measurements completed on examination: See above findings.    TODAY'S TREATMENT: 04/02/24  Seated Blood pressure: 148/62 mmHg Standing Blood pressure: 133/55 mmHg (asymptomatic)   Therapeutic activities:   Resistive gait  3# AW BLE in clinic 113ft - focusing on increased step length no UE support Resistive gait  3# AW BLE in clinic 375ft - using SPC focusing on increased step length Standing hip ext alt LE x 15 reps each Forward lunge x 8 bil with UE support on rail 3 # AW Side stepping up onto 6" block with 3 # AW x 10 each direction Forward/reverse walking with 3 # AW x 42ft x 6- (counting steps forward from initially 9 steps down to 7 and backward from 14 down to 10 steps)   NMR:   Side stepping up/over 1/2 foam  3# AW- x 20 reps (attempted to perform without UE support- unable without 1 UE support)  Forward/backward step up/over 1/2 foam roll 3# AW x 20 reps  CGA/supervision with all interventions today. As well as therapeutic rest breaks between PT interventions.   PT Education -     Education Details Exercise technique- VC for safety    Safety in management of unlevel sidewalk to imrprove step height                      PT Short Term Goals -        PT SHORT TERM GOAL #1   Title compliant with balance HEP    Time 4    Period Weeks    Status Achieved    Target Date 08/12/23      PT SHORT TERM GOAL #2   Title Improved overground gait speed over 532ft  to improve ability to access limited community distances safely.    Baseline eval: 316ft at 0.70m/s; 664ft s device 0.39m/s.   Time 4    Period Weeks    Status Achieved     Target Date 08/12/23               PT Long Term Goals -       PT LONG TERM GOAL #1   Title FOTO score improvement >12  points to indicate improve confidence in balance.    Baseline 07/19/2023=43; 08/22/23: 51; 09/08/2023 =will assess next visit; 10/22= 52; 12/01/2023= 57   Time 12   Period Weeks    Status MET   Target Date 12/01/2023     PT LONG TERM GOAL #2   Title Improvement in standardized balance assessment by MCID or greater.    Baseline 07/19/2023= BERG- 35/56; 08/22/23: 44   Time 8    Period Weeks    Status Achieved     Target Date 09/09/23      PT LONG TERM GOAL #3   Title Pt to demonstrate 5xSTS hands free from standard height surface to indicated improved BLE power.    Baseline 07/19/2023= 31.07 sec without UE Support; 08/22/23: 19.15sec (hands on thighs); 09/08/2023= 16.42 sec; 12/01/2023= 14.42 sec without UE support    Time 8    Period Weeks    Status MET   Target Date 09/09/23      PT LONG TERM GOAL #4   Title Pt to demonstrate performance of 2044ft AMB >0.32m/s without increased pain and without LOB at modI level or less to restore ability to access comunity distances for IADL.    Baseline 08/22/23: 681ft in 4:02 (0.4m/s); 08/25/2023= 1200 in . 9/12= 0.62 m/s; 12/01/2023= will reassess next visit 02/27/2024: 1570ft in 11:41min with SPC    Time 8    Period Weeks    Status Ongoing    Target Date 04/23/2024          PT LONG TERM GOAL #5  Title Pt will improve BERG by at least 3 points in order to demonstrate clinically significant improvement in balance.   Baseline 09/08/2023 = 46/56; 12/01/2023= 47/56 02/27/2024: 48   Time 12  Period Weeks   Status PROGRESSING  Target Date 04/23/2024      PT LONG TERM GOAL #6  Title  Pt will decrease 5TSTS by at least 3 seconds in order to  demonstrate clinically significant improvement in LE strength.  Baseline 09/08/2023= 16.42 sec 12/01/2023= 14.42 sec without UE support  02/27/2024: 14.74 sec without UE support   Time 12  Period Weeks   Status  PROGRESSING  Target Date 04/23/2024        Plan -     Clinical Impression Statement Treatment continues to focus on LE strengthening and dynamic balance activities. She continues to have decreased confidence in performing any activity that requires more SLS. She did have some in session progress with improved step length with practice today. Overall- no symptoms of any dizziness in treatment- Encouraged her to report to her primary MD and she states she will inform him at her upcoming MD visit next week. Pt will benefit from continued PT services to address and improve her functional strength and mobility to assist in return to previous level of function.    Personal Factors and Comorbidities Age;Fitness;Behavior Pattern;Past/Current Experience    Examination-Activity Limitations Locomotion Level;Transfers;Lift;Stairs    Examination-Participation Restrictions Meal Prep;Cleaning;Community Activity;Driving;Interpersonal Relationship;Yard Work    Stability/Clinical Decision Making Stable/Uncomplicated    Clinical Decision Making Moderate    Rehab Potential Good    PT Frequency 1-2x / week    PT Duration 12 weeks    PT Treatment/Interventions ADLs/Self Care Home Management;Gait training;Stair training;Functional mobility training;Neuromuscular re-education;Balance training;Therapeutic exercise;Therapeutic activities;Passive range of motion;Manual techniques    PT Next Visit Plan Continue with balance and therex  for improved strength and balance         PT Home  Exercise Plan asked at eval to check and log BP twice daily,   Access Code: [email or web address] URL: [email or web address] Date: 03/05/2024 Prepared by: Maureen Ralphs  Exercises - Sit to Stand with Arms Crossed  - 3  x weekly - 3 sets - 10 reps - Side Stepping with Resistance at Ankles and Counter Support  - 3 x weekly - 3 sets - Forward Lunge with Counter Support  - 3 x weekly - 3 sets - 10 reps - Standing Hip Extension with Counter Support  - 3 x weekly - 3 sets - 10 reps - Standing Heel Raises  - 3 x weekly - 3 sets - 10 reps - Standing Single Leg Stance with Counter Support  - 3 x weekly - 5-10 sets - as long as possible hold - Tandem Stance  - 3 x weekly - 3-5 sets - up to 30 sec hold - March in Place  - 3 x weekly - 3 sets - 10 reps  Access Code: [email or web address] URL: [email or web address] Date: 07/21/2023 Prepared by: Grier Rocher  Exercises - Sit to Stand with Armchair  - 1 x daily - 5 x weekly - 2 sets - 6 reps - Standing Hip Abduction with Counter Support  - 1 x daily - 7 x weekly - 3 sets - 10 reps - Seated Hip Abduction with Resistance  - 1 x daily - 7 x weekly - 3 sets - 10 reps - Seated March with Resistance  - 1 x daily - 7 x weekly - 3 sets - 10 reps   Consulted and Agree with Plan of Care Patient;Family member/caregiver             Patient will benefit from skilled therapeutic intervention in order to improve the following deficits and impairments:   balance impairments, endurance deficits, reduced strength,   Visit Diagnosis: Muscle weakness (generalized)  Difficulty in walking, not elsewhere classified  Unsteadiness on feet  Repeated falls     Problem List Patient Active Problem List   Diagnosis Date Noted   Obstructive nephropathy 01/13/2023   Acute pyelonephritis 01/13/2023   AKI (acute kidney injury) (HCC) 01/13/2023   Hypokalemia 01/13/2023   Diarrhea 01/13/2023   Thoracic aortic aneurysm without rupture (HCC) 11/09/2022   Hematuria 11/05/2022   Polyp of esophagus 11/02/2022   Iron deficiency anemia 07/28/2022   Positive colorectal cancer screening using Cologuard test    Abnormal ECG 03/31/2021   Acute non-recurrent sinusitis 11/30/2020    Acute otitis media 11/30/2020   Benign essential HTN 10/31/2018   Frequent PVCs 09/08/2017   A-fib (HCC) 08/27/2015   Clinical depression 08/27/2015   Blood pressure elevated 08/27/2015   Polypharmacy 08/27/2015   Esophagitis, reflux 08/27/2015   Fatigue 08/27/2015   Acid reflux 08/27/2015   HLD (hyperlipidemia) 08/27/2015   Adult hypothyroidism 08/27/2015   Adaptive colitis 08/27/2015   Malaise and fatigue 08/27/2015   Mild major depression (HCC) 08/27/2015   Polymyalgia rheumatica (HCC) 08/27/2015   Cranial arteritis (HCC) 08/27/2015   Avitaminosis D 08/27/2015     Louis Meckel, PT Physical Therapist - [facility]  10:43 AM 04/02/24

## 2024-04-06 NOTE — Therapy (Signed)
 [facility] Outpatient Rehabilitation at [facility] [address] [place], [place], [address] Phone: [phone]   Fax:  [phone]  Outpatient Physical Therapy Treatment     Patient Details  Name: Tara Marquez MRN: [id number] Date of Birth: [birth year] Referring Provider (PT): Tara Leigh MD (PCP)   Encounter Date: 04/09/2024   PT End of Session - 04/09/24 0926     Visit Number 45    Number of Visits 55    Date for PT Re-Evaluation 04/23/24    Authorization Type Medicare A&B primary with BCBS supplemental secondary    Authorization Time Period 07/14/23-09/08/23    Progress Note Due on Visit 50    PT Start Time 0930    PT Stop Time 1012    PT Time Calculation (min) 42 min    Equipment Utilized During Treatment Gait belt    Activity Tolerance Patient tolerated treatment well    Behavior During Therapy WFL for tasks assessed/performed                               Past Medical History:  Diagnosis Date   Acute pyelonephritis    Adaptive colitis    Anemia    Aortic atherosclerosis (HCC)    Atrial fibrillation and flutter (HCC)    a.) CHA2DS2VASc = 5 (age x 2, sex, HTN, vascular disease history);  b.) rate/rhythm maintained on oral amiodarone + metoprolol succinate; chronically anticoagulated with dose reduced apixaban   CAD (coronary artery disease)    Depression    Descending thoracic aortic aneurysm (HCC)    a.) CT chest 09/29/2022: 4.9 cm; b.) CT abd 01/13/2023: 4.9 cm   Frequent PVCs    GERD (gastroesophageal reflux disease)    Hepatic steatosis    History of 2019 novel coronavirus disease (COVID-19) 12/04/2021   Hyperlipidemia    Hypertension    Hypokalemia    Hypothyroidism    Long term current use of amiodarone    Long term current use of anticoagulant    a.) dose reduced apixaban   Nephrolithiasis    Osteopenia    Polymyalgia rheumatica (HCC)    Skin cancer    Splenomegaly    Temporal arteritis (HCC)     Vitamin D deficiency     Past Surgical History:  Procedure Laterality Date   ABDOMINAL HYSTERECTOMY  03/1971   Status Post Hysterectomy   CATARACT EXTRACTION Left    COLONOSCOPY     2010   COLONOSCOPY WITH PROPOFOL N/A 01/12/2022   Procedure: COLONOSCOPY WITH PROPOFOL;  Surgeon: Tara Sink, MD;  Location: [facility] ENDOSCOPY;  Service: Endoscopy;  Laterality: N/A;   CYSTOSCOPY WITH RETROGRADE PYELOGRAM, URETEROSCOPY AND STENT PLACEMENT Left 01/13/2023   Procedure: CYSTOSCOPY WITH RETROGRADE PYELOGRAM, URETEROSCOPY AND STENT PLACEMENT;  Surgeon: Tara Knapp, MD;  Location: [facility] ORS;  Service: Urology;  Laterality: Left;   CYSTOSCOPY/URETEROSCOPY/HOLMIUM LASER/STENT PLACEMENT Left 02/08/2023   Procedure: CYSTOSCOPY/URETEROSCOPY/HOLMIUM LASER/STENT EXCHANGE;  Surgeon: Tara Knapp, MD;  Location: [facility] ORS;  Service: Urology;  Laterality: Left;   ESOPHAGOGASTRODUODENOSCOPY N/A 11/02/2022   Procedure: ESOPHAGOGASTRODUODENOSCOPY (EGD);  Surgeon: Tara Sink, MD;  Location: [facility] ENDOSCOPY;  Service: Endoscopy;  Laterality: N/A;   EYE SURGERY     GIVENS CAPSULE STUDY N/A 12/22/2022   Procedure: GIVENS CAPSULE STUDY;  Surgeon: Tara Sink, MD;  Location: [facility] ENDOSCOPY;  Service: Endoscopy;  Laterality: N/A;   TONSILLECTOMY     TONSILLECTOMY AND ADENOIDECTOMY  1951  WISDOM TOOTH EXTRACTION  1970       Subjective Assessment     Subjective Patient reports she wasn't feeling great over the weekend and feels like she had allergies that may be a cold. Still having dizziness off and on and will be seeing her MD on Wednesday 4/16.    Pertinent History [name] is an 82yoF who is referred by PCP due to gradual progressive weakness since hospital admission in February 2024. Pt presents to PT eval with husband. Pt reports being in hospital for ~ a week in Feb 2024 with urinary obstruction, AKI, felt week initially but declined HHPT; pt/husband say progressively more weak over subsequent months,  more difficulty with walking, husband has now asked her to use a cane. Pt reports 5 falls, 4 of which were outside on natural surface 5th trying to clear a threshold (posterior fall buttock, back, hit head). Does get dizzy, but not frequently, not related to falls often.    How long can you sit comfortably? Not a limitation    How long can you stand comfortably? Not a limitation    How long can you walk comfortably? Mostly walks household distances, but can tolerate occasional store trips with buggy support.    Currently in Pain? No/denies               Objective measurements completed on examination: See above findings.    TODAY'S TREATMENT: 04/09/24  Therapeutic activities:   Resistive gait 3# AW BLE in clinic x ~400' with no UE support with focus on increasing step length  Sit to stand with feet on purple airex 2 x 10 with no UE support  Standing hip abduction with 3# AW 2 x 10 each LE  Standing hip extension with 3# AW (alt LE) x 15  Sidestepping 10' x 6 laps with 3# AW   Forward/reverse walking with 3# 10' x 4  Standing 6" step taps with single UE support on rail x 20   NMR:   Side stepping up/over 1/2 foam  3# AW- x 20 reps (attempted to perform without UE support- unable without 1 UE support)  Forward/backward step up/over 1/2 foam roll 3# AW x 20 reps with 1 UE support on bar   CGA/supervision with all interventions today. As well as therapeutic rest breaks between PT interventions.     PT Education -     Education Details Exercise technique- VC for safety    Safety in management of unlevel sidewalk to imrprove step height                      PT Short Term Goals -        PT SHORT TERM GOAL #1   Title compliant with balance HEP    Time 4    Period Weeks    Status Achieved    Target Date 08/12/23      PT SHORT TERM GOAL #2   Title Improved overground gait speed over 553ft to improve ability to access limited community distances safely.    Baseline  eval: 380ft at 0.73m/s; 653ft s device 0.47m/s.   Time 4    Period Weeks    Status Achieved     Target Date 08/12/23               PT Long Term Goals -       PT LONG TERM GOAL #1   Title FOTO score improvement >12 points to indicate  improve confidence in balance.    Baseline 07/19/2023=43; 08/22/23: 51; 09/08/2023 =will assess next visit; 10/22= 52; 12/01/2023= 57   Time 12   Period Weeks    Status MET   Target Date 12/01/2023     PT LONG TERM GOAL #2   Title Improvement in standardized balance assessment by MCID or greater.    Baseline 07/19/2023= BERG- 35/56; 08/22/23: 44   Time 8    Period Weeks    Status Achieved     Target Date 09/09/23      PT LONG TERM GOAL #3   Title Pt to demonstrate 5xSTS hands free from standard height surface to indicated improved BLE power.    Baseline 07/19/2023= 31.07 sec without UE Support; 08/22/23: 19.15sec (hands on thighs); 09/08/2023= 16.42 sec; 12/01/2023= 14.42 sec without UE support    Time 8    Period Weeks    Status MET   Target Date 09/09/23      PT LONG TERM GOAL #4   Title Pt to demonstrate performance of 2040ft AMB >0.72m/s without increased pain and without LOB at modI level or less to restore ability to access comunity distances for IADL.    Baseline 08/22/23: 672ft in 4:02 (0.61m/s); 08/25/2023= 1200 in . 9/12= 0.62 m/s; 12/01/2023= will reassess next visit 02/27/2024: 1513ft in 11:94min with SPC    Time 8    Period Weeks    Status Ongoing    Target Date 04/23/2024          PT LONG TERM GOAL #5  Title Pt will improve BERG by at least 3 points in order to demonstrate clinically significant improvement in balance.   Baseline 09/08/2023 = 46/56; 12/01/2023= 47/56 02/27/2024: 48   Time 12  Period Weeks   Status PROGRESSING  Target Date 04/23/2024      PT LONG TERM GOAL #6  Title  Pt will decrease 5TSTS by at least 3 seconds in order to demonstrate clinically significant improvement in LE strength.  Baseline 09/08/2023=  16.42 sec 12/01/2023= 14.42 sec without UE support  02/27/2024: 14.74 sec without UE support   Time 12  Period Weeks   Status  PROGRESSING  Target Date 04/23/2024        Plan -     Clinical Impression Statement  Treatment continues to focus on LE strengthening and dynamic balance activities. She continues to have decreased confidence in performing any activity that requires more SLS. No dizziness reported this session but she plans to follow up with MD on 4/16 about dizziness. Pt will benefit from continued PT services to address and improve her functional strength and mobility to assist in return to previous level of function.    Personal Factors and Comorbidities Age;Fitness;Behavior Pattern;Past/Current Experience    Examination-Activity Limitations Locomotion Level;Transfers;Lift;Stairs    Examination-Participation Restrictions Meal Prep;Cleaning;Community Activity;Driving;Interpersonal Relationship;Yard Work    Stability/Clinical Decision Making Stable/Uncomplicated    Clinical Decision Making Moderate    Rehab Potential Good    PT Frequency 1-2x / week    PT Duration 12 weeks    PT Treatment/Interventions ADLs/Self Care Home Management;Gait training;Stair training;Functional mobility training;Neuromuscular re-education;Balance training;Therapeutic exercise;Therapeutic activities;Passive range of motion;Manual techniques    PT Next Visit Plan Continue with balance and therex  for improved strength and balance         PT Home Exercise Plan asked at eval to check and log BP twice daily,   Access Code: [email or web address] URL: [email or web address] Date: 03/05/2024 Prepared by: Ferrell Hu  Exercises - Sit  to Stand with Arms Crossed  - 3 x weekly - 3 sets - 10 reps - Side Stepping with Resistance at Ankles and Counter Support  - 3 x weekly - 3 sets - Forward Lunge with Counter Support  - 3 x weekly - 3 sets - 10 reps - Standing Hip Extension with Counter Support  - 3 x  weekly - 3 sets - 10 reps - Standing Heel Raises  - 3 x weekly - 3 sets - 10 reps - Standing Single Leg Stance with Counter Support  - 3 x weekly - 5-10 sets - as long as possible hold - Tandem Stance  - 3 x weekly - 3-5 sets - up to 30 sec hold - March in Place  - 3 x weekly - 3 sets - 10 reps  Access Code: [email or web address] URL: [email or web address] Date: 07/21/2023 Prepared by: Aurora Lees  Exercises - Sit to Stand with Armchair  - 1 x daily - 5 x weekly - 2 sets - 6 reps - Standing Hip Abduction with Counter Support  - 1 x daily - 7 x weekly - 3 sets - 10 reps - Seated Hip Abduction with Resistance  - 1 x daily - 7 x weekly - 3 sets - 10 reps - Seated March with Resistance  - 1 x daily - 7 x weekly - 3 sets - 10 reps   Consulted and Agree with Plan of Care Patient;Family member/caregiver             Patient will benefit from skilled therapeutic intervention in order to improve the following deficits and impairments:   balance impairments, endurance deficits, reduced strength,   Visit Diagnosis: Muscle weakness (generalized)  Difficulty in walking, not elsewhere classified  Unsteadiness on feet  Repeated falls     Problem List Patient Active Problem List   Diagnosis Date Noted   Obstructive nephropathy 01/13/2023   Acute pyelonephritis 01/13/2023   AKI (acute kidney injury) (HCC) 01/13/2023   Hypokalemia 01/13/2023   Diarrhea 01/13/2023   Thoracic aortic aneurysm without rupture (HCC) 11/09/2022   Hematuria 11/05/2022   Polyp of esophagus 11/02/2022   Iron deficiency anemia 07/28/2022   Positive colorectal cancer screening using Cologuard test    Abnormal ECG 03/31/2021   Acute non-recurrent sinusitis 11/30/2020   Acute otitis media 11/30/2020   Benign essential HTN 10/31/2018   Frequent PVCs 09/08/2017   A-fib (HCC) 08/27/2015   Clinical depression 08/27/2015   Blood pressure elevated 08/27/2015   Polypharmacy 08/27/2015   Esophagitis, reflux  08/27/2015   Fatigue 08/27/2015   Acid reflux 08/27/2015   HLD (hyperlipidemia) 08/27/2015   Adult hypothyroidism 08/27/2015   Adaptive colitis 08/27/2015   Malaise and fatigue 08/27/2015   Mild major depression (HCC) 08/27/2015   Polymyalgia rheumatica (HCC) 08/27/2015   Cranial arteritis (HCC) 08/27/2015   Avitaminosis D 08/27/2015    Janine Melbourne, PT, DPT  Physical Therapist - [place]  [facility]  9:28 AM 04/09/24

## 2024-04-09 ENCOUNTER — Ambulatory Visit: Payer: Medicare Other

## 2024-04-09 DIAGNOSIS — R296 Repeated falls: Secondary | ICD-10-CM

## 2024-04-09 DIAGNOSIS — M6281 Muscle weakness (generalized): Secondary | ICD-10-CM

## 2024-04-09 DIAGNOSIS — R2681 Unsteadiness on feet: Secondary | ICD-10-CM

## 2024-04-09 DIAGNOSIS — R262 Difficulty in walking, not elsewhere classified: Secondary | ICD-10-CM

## 2024-04-16 ENCOUNTER — Ambulatory Visit: Payer: Medicare Other

## 2024-04-16 DIAGNOSIS — M6281 Muscle weakness (generalized): Secondary | ICD-10-CM | POA: Diagnosis not present

## 2024-04-16 DIAGNOSIS — R296 Repeated falls: Secondary | ICD-10-CM

## 2024-04-16 DIAGNOSIS — R262 Difficulty in walking, not elsewhere classified: Secondary | ICD-10-CM

## 2024-04-16 DIAGNOSIS — R2681 Unsteadiness on feet: Secondary | ICD-10-CM

## 2024-04-16 NOTE — Therapy (Addendum)
 [facility] Outpatient Rehabilitation at [facility] [address] [place], [place], [address] Phone: [phone]   Fax:  [phone]  Outpatient Physical Therapy Treatment     Patient Details  Name: Tara Marquez MRN: [id number] Date of Birth: [birth year] Referring Provider (PT): Gustavo Leigh MD (PCP)   Encounter Date: 04/16/2024   PT End of Session - 04/16/24 0938     Visit Number 46    Number of Visits 55    Date for PT Re-Evaluation 04/23/24    Authorization Type Medicare A&B primary with BCBS supplemental secondary    Authorization Time Period 07/14/23-09/08/23    Progress Note Due on Visit 50    PT Stop Time 0930    Equipment Utilized During Treatment Gait belt    Activity Tolerance Patient tolerated treatment well    Behavior During Therapy [facility] for tasks assessed/performed                                Past Medical History:  Diagnosis Date   Acute pyelonephritis    Adaptive colitis    Anemia    Aortic atherosclerosis (HCC)    Atrial fibrillation and flutter (HCC)    a.) CHA2DS2VASc = 5 (age x 2, sex, HTN, vascular disease history);  b.) rate/rhythm maintained on oral amiodarone  + metoprolol  succinate; chronically anticoagulated with dose reduced apixaban    CAD (coronary artery disease)    Depression    Descending thoracic aortic aneurysm (HCC)    a.) CT chest 09/29/2022: 4.9 cm; b.) CT abd 01/13/2023: 4.9 cm   Frequent PVCs    GERD (gastroesophageal reflux disease)    Hepatic steatosis    History of 2019 novel coronavirus disease (COVID-19) 12/04/2021   Hyperlipidemia    Hypertension    Hypokalemia    Hypothyroidism    Long term current use of amiodarone     Long term current use of anticoagulant    a.) dose reduced apixaban    Nephrolithiasis    Osteopenia    Polymyalgia rheumatica (HCC)    Skin cancer    Splenomegaly    Temporal arteritis (HCC)    Vitamin D  deficiency     Past Surgical History:   Procedure Laterality Date   ABDOMINAL HYSTERECTOMY  03/1971   Status Post Hysterectomy   CATARACT EXTRACTION Left    COLONOSCOPY     2010   COLONOSCOPY WITH PROPOFOL  N/A 01/12/2022   Procedure: COLONOSCOPY WITH PROPOFOL ;  Surgeon: Marnee Sink, MD;  Location: [facility] ENDOSCOPY;  Service: Endoscopy;  Laterality: N/A;   CYSTOSCOPY WITH RETROGRADE PYELOGRAM, URETEROSCOPY AND STENT PLACEMENT Left 01/13/2023   Procedure: CYSTOSCOPY WITH RETROGRADE PYELOGRAM, URETEROSCOPY AND STENT PLACEMENT;  Surgeon: Geraline Knapp, MD;  Location: [facility] ORS;  Service: Urology;  Laterality: Left;   CYSTOSCOPY/URETEROSCOPY/HOLMIUM LASER/STENT PLACEMENT Left 02/08/2023   Procedure: CYSTOSCOPY/URETEROSCOPY/HOLMIUM LASER/STENT EXCHANGE;  Surgeon: Geraline Knapp, MD;  Location: [facility] ORS;  Service: Urology;  Laterality: Left;   ESOPHAGOGASTRODUODENOSCOPY N/A 11/02/2022   Procedure: ESOPHAGOGASTRODUODENOSCOPY (EGD);  Surgeon: Marnee Sink, MD;  Location: [facility] ENDOSCOPY;  Service: Endoscopy;  Laterality: N/A;   EYE SURGERY     GIVENS CAPSULE STUDY N/A 12/22/2022   Procedure: GIVENS CAPSULE STUDY;  Surgeon: Marnee Sink, MD;  Location: [facility] ENDOSCOPY;  Service: Endoscopy;  Laterality: N/A;   TONSILLECTOMY     TONSILLECTOMY AND ADENOIDECTOMY  1951   WISDOM TOOTH EXTRACTION  1970       Subjective Assessment  Subjective Patient reports dizziness comes and goes. States she forgot to ask about the dizziness at her MD appointment. Otherwise she states MD visit went well and doesn't have to go back until September    Pertinent History Tara Marquez is an 82yoF who is referred by PCP due to gradual progressive weakness since hospital admission in February 2024. Pt presents to PT eval with husband. Pt reports being in hospital for ~ a week in Feb 2024 with urinary obstruction, AKI, felt week initially but declined HHPT; pt/husband say progressively more weak over subsequent months, more difficulty with walking, husband has now  asked her to use a cane. Pt reports 5 falls, 4 of which were outside on natural surface 5th trying to clear a threshold (posterior fall buttock, back, hit head). Does get dizzy, but not frequently, not related to falls often.    How long can you sit comfortably? Not a limitation    How long can you stand comfortably? Not a limitation    How long can you walk comfortably? Mostly walks household distances, but can tolerate occasional store trips with buggy support.    Currently in Pain? No/denies               Objective measurements completed on examination: See above findings.    TODAY'S TREATMENT: 04/16/24  Therapeutic activities:  Ambulation in lower level of Hospital > 2000 feet in less than 12 min (holding SPC)   Review of seated therex: 15 reps of the following:  - Seated hip march - Seated knee ext - Seated calf raises - Seated toe raises - Seated hip abd/add     CGA/supervision with all interventions today. As well as therapeutic rest breaks between PT interventions.     PT Education -     Education Details Exercise technique- VC for safety    Safety in management of unlevel sidewalk to imrprove step height                      PT Short Term Goals -        PT SHORT TERM GOAL #1   Title compliant with balance HEP    Time 4    Period Weeks    Status Achieved    Target Date 08/12/23      PT SHORT TERM GOAL #2   Title Improved overground gait speed over 515ft to improve ability to access limited community distances safely.    Baseline eval: 376ft at 0.83m/s; 642ft s device 0.10m/s.   Time 4    Period Weeks    Status Achieved     Target Date 08/12/23               PT Long Term Goals -       PT LONG TERM GOAL #1   Title FOTO score improvement >12 points to indicate improve confidence in balance.    Baseline 07/19/2023=43; 08/22/23: 51; 09/08/2023 =will assess next visit; 10/22= 52; 12/01/2023= 57   Time 12   Period Weeks    Status MET    Target Date 12/01/2023     PT LONG TERM GOAL #2   Title Improvement in standardized balance assessment by MCID or greater.    Baseline 07/19/2023= BERG- 35/56; 08/22/23: 44   Time 8    Period Weeks    Status Achieved     Target Date 09/09/23      PT LONG TERM GOAL #3   Title Pt to  demonstrate 5xSTS hands free from standard height surface to indicated improved BLE power.    Baseline 07/19/2023= 31.07 sec without UE Support; 08/22/23: 19.15sec (hands on thighs); 09/08/2023= 16.42 sec; 12/01/2023= 14.42 sec without UE support    Time 8    Period Weeks    Status MET   Target Date 09/09/23      PT LONG TERM GOAL #4   Title Pt to demonstrate performance of 2045ft AMB >0.25m/s without increased pain and without LOB at modI level or less to restore ability to access comunity distances for IADL.    Baseline 08/22/23: 678ft in 4:02 (0.70m/s); 08/25/2023= 1200 in . 9/12= 0.62 m/s; 12/01/2023= will reassess next visit 02/27/2024: 1527ft in 11:63min with SPC; 04/16/2024= > 2000 feet in less than 12 min= 0.85 m/s Holding onto [facility]   Time 8    Period Weeks    Status MET    Target Date 04/23/2024          PT LONG TERM GOAL #5  Title Pt will improve BERG by at least 3 points in order to demonstrate clinically significant improvement in balance.   Baseline 09/08/2023 = 46/56; 12/01/2023= 47/56 02/27/2024: 48   Time 12  Period Weeks   Status PROGRESSING  Target Date 04/23/2024      PT LONG TERM GOAL #6  Title  Pt will decrease 5TSTS by at least 3 seconds in order to demonstrate clinically significant improvement in LE strength.  Baseline 09/08/2023= 16.42 sec 12/01/2023= 14.42 sec without UE support  02/27/2024: 14.74 sec without UE support   Time 12  Period Weeks   Status  PROGRESSING  Target Date 04/23/2024        Plan -     Clinical Impression Statement  Patient presents with good motivation for today's session. She has met her long term goal of increasing her distance walking goal and reported  only moderately tired. Spent rest of session reviewing some seated therex as she has reported some intermittent dizziness.  Plan is still to discharge to a home program next visit and will reassess all remaining goals and include finalizing HEP.    Personal Factors and Comorbidities Age;Fitness;Behavior Pattern;Past/Current Experience    Examination-Activity Limitations Locomotion Level;Transfers;Lift;Stairs    Examination-Participation Restrictions Meal Prep;Cleaning;Community Activity;Driving;Interpersonal Relationship;Yard Work    Stability/Clinical Decision Making Stable/Uncomplicated    Clinical Decision Making Moderate    Rehab Potential Good    PT Frequency 1-2x / week    PT Duration 12 weeks    PT Treatment/Interventions ADLs/Self Care Home Management;Gait training;Stair training;Functional mobility training;Neuromuscular re-education;Balance training;Therapeutic exercise;Therapeutic activities;Passive range of motion;Manual techniques    PT Next Visit Plan Reassess all goals and plan for discharge to HEP next visit.         PT Home Exercise Plan asked at eval to check and log BP twice daily,   Access Code: [email or web address] URL: [email or web address] Date: 03/05/2024 Prepared by: Ferrell Hu  Exercises - Sit to Stand with Arms Crossed  - 3 x weekly - 3 sets - 10 reps - Side Stepping with Resistance at Ankles and Counter Support  - 3 x weekly - 3 sets - Forward Lunge with Counter Support  - 3 x weekly - 3 sets - 10 reps - Standing Hip Extension with Counter Support  - 3 x weekly - 3 sets - 10 reps - Standing Heel Raises  - 3 x weekly - 3 sets - 10 reps - Standing Single Leg Stance with Counter Support  - 3  x weekly - 5-10 sets - as long as possible hold - Tandem Stance  - 3 x weekly - 3-5 sets - up to 30 sec hold - March in Place  - 3 x weekly - 3 sets - 10 reps  Access Code: [email or web address] URL: [email or web address] Date: 07/21/2023 Prepared by: Aurora Lees  Exercises - Sit to Stand with Armchair  - 1 x daily - 5 x weekly - 2 sets - 6 reps - Standing Hip Abduction with Counter Support  - 1 x daily - 7 x weekly - 3 sets - 10 reps - Seated Hip Abduction with Resistance  - 1 x daily - 7 x weekly - 3 sets - 10 reps - Seated March with Resistance  - 1 x daily - 7 x weekly - 3 sets - 10 reps   Consulted and Agree with Plan of Care Patient;Family member/caregiver             Patient will benefit from skilled therapeutic intervention in order to improve the following deficits and impairments:   balance impairments, endurance deficits, reduced strength,   Visit Diagnosis: Muscle weakness (generalized)  Difficulty in walking, not elsewhere classified  Unsteadiness on feet  Repeated falls     Problem List Patient Active Problem List   Diagnosis Date Noted   Obstructive nephropathy 01/13/2023   Acute pyelonephritis 01/13/2023   AKI (acute kidney injury) (HCC) 01/13/2023   Hypokalemia 01/13/2023   Diarrhea 01/13/2023   Thoracic aortic aneurysm without rupture (HCC) 11/09/2022   Hematuria 11/05/2022   Polyp of esophagus 11/02/2022   Iron deficiency anemia 07/28/2022   Positive colorectal cancer screening using Cologuard test    Abnormal ECG 03/31/2021   Acute non-recurrent sinusitis 11/30/2020   Acute otitis media 11/30/2020   Benign essential HTN 10/31/2018   Frequent PVCs 09/08/2017   A-fib (HCC) 08/27/2015   Clinical depression 08/27/2015   Blood pressure elevated 08/27/2015   Polypharmacy 08/27/2015   Esophagitis, reflux 08/27/2015   Fatigue 08/27/2015   Acid reflux 08/27/2015   HLD (hyperlipidemia) 08/27/2015   Adult hypothyroidism 08/27/2015   Adaptive colitis 08/27/2015   Malaise and fatigue 08/27/2015   Mild major depression (HCC) 08/27/2015   Polymyalgia rheumatica (HCC) 08/27/2015   Cranial arteritis (HCC) 08/27/2015   Avitaminosis D 08/27/2015    Ossie Blend, PT Physical Therapist - [facility]  10:19 AM 04/16/24

## 2024-04-17 ENCOUNTER — Ambulatory Visit (INDEPENDENT_AMBULATORY_CARE_PROVIDER_SITE_OTHER): Admitting: Vascular Surgery

## 2024-04-17 ENCOUNTER — Encounter (INDEPENDENT_AMBULATORY_CARE_PROVIDER_SITE_OTHER): Payer: Self-pay | Admitting: Vascular Surgery

## 2024-04-17 VITALS — BP 159/80 | HR 68 | Resp 18 | Ht 65.0 in | Wt 121.0 lb

## 2024-04-17 DIAGNOSIS — I10 Essential (primary) hypertension: Secondary | ICD-10-CM | POA: Diagnosis not present

## 2024-04-17 DIAGNOSIS — E785 Hyperlipidemia, unspecified: Secondary | ICD-10-CM | POA: Diagnosis not present

## 2024-04-17 DIAGNOSIS — I7123 Aneurysm of the descending thoracic aorta, without rupture: Secondary | ICD-10-CM | POA: Diagnosis not present

## 2024-04-17 NOTE — Progress Notes (Unsigned)
 MRN : [id number]  Tara Marquez is a 83 y.o. ([birth year]) female who presents with chief complaint of  Chief Complaint  Patient presents with   Follow-up    fu with CT results  .  History of Present Illness: Patient returns today in follow up of her CT angiogram of the chest which I have independently reviewed.  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  Patient reports no chest pain or back pain.  No signs of peripheral embolization.  I have independently reviewed her recent CT angiogram of the chest.  There is no significant change to the chronic appearing dissection/penetrating ulcers of the mid and distal descending thoracic aorta with mild aneurysmal degeneration which has not changed from previous.  This still measures just below 5 cm in maximal diameter by my review.  Of note, there was a pulmonary nodule commented on by the official report which appeared different than on previous studies.  Current Outpatient Medications  Medication Sig Dispense Refill   apixaban  (ELIQUIS ) 2.5 MG TABS tablet Take 1 tablet (2.5 mg total) by mouth 2 (two) times daily. 60 tablet 0   atorvastatin  (LIPITOR) 40 MG tablet Take 1 tablet (40 mg total) by mouth daily at 6 PM. (Patient taking differently: Take 40 mg by mouth every evening.) 30 tablet 0   levothyroxine  (SYNTHROID ) 75 MCG tablet Take 1 tablet (75 mcg total) by mouth daily. (Patient taking differently: Take 75 mcg by mouth daily before breakfast.) 90 tablet 1   lisinopril (ZESTRIL) 10 MG tablet Take 10 mg by mouth daily.     nitroGLYCERIN  (NITROSTAT ) 0.4 MG SL tablet Place 1 tablet (0.4 mg total) under the tongue every 5 (five) minutes as needed for chest pain. 30 tablet 2   ondansetron  (ZOFRAN ) 4 MG tablet Take 4 mg by mouth every morning.     sertraline  (ZOLOFT ) 100 MG tablet TAKE 1 & 1/2 (ONE & ONE-HALF) TABLETS BY MOUTH ONCE DAILY NEED  TO  SCHEDULE  AN  APPOINTMENT (Patient taking  differently: Take 150 mg by mouth every morning. TAKE 1 & 1/2 (ONE & ONE-HALF) TABLETS BY MOUTH ONCE DAILY NEED  TO  SCHEDULE  AN  APPOINTMENT) 135 tablet 3   amiodarone  (PACERONE ) 200 MG tablet Take 1 tablet (200 mg total) by mouth 2 (two) times daily. (Patient taking differently: Take 100 mg by mouth 2 (two) times daily.) 50 tablet 0   metoprolol  succinate (TOPROL -XL) 25 MG 24 hr tablet Take 25 mg by mouth at bedtime.     No current facility-administered medications for this visit.    Past Medical History:  Diagnosis Date   Acute pyelonephritis    Adaptive colitis    Anemia    Aortic atherosclerosis (HCC)    Atrial fibrillation and flutter (HCC)    a.) CHA2DS2VASc = 5 (age x 2, sex, HTN, vascular disease history);  b.) rate/rhythm maintained on oral amiodarone  + metoprolol  succinate; chronically anticoagulated with dose reduced apixaban    CAD (coronary artery disease)    Depression    Descending thoracic aortic aneurysm (HCC)    a.) CT chest 09/29/2022: 4.9 cm; b.) CT abd 01/13/2023: 4.9 cm   Frequent PVCs    GERD (gastroesophageal reflux disease)    Hepatic steatosis    History of 2019 novel coronavirus disease (COVID-19) 12/04/2021   Hyperlipidemia    Hypertension    Hypokalemia    Hypothyroidism    Long term current use of amiodarone   Long term current use of anticoagulant    a.) dose reduced apixaban    Nephrolithiasis    Osteopenia    Polymyalgia rheumatica (HCC)    Skin cancer    Splenomegaly    Temporal arteritis (HCC)    Vitamin D  deficiency     Past Surgical History:  Procedure Laterality Date   ABDOMINAL HYSTERECTOMY  03/1971   Status Post Hysterectomy   CATARACT EXTRACTION Left    COLONOSCOPY     2010   COLONOSCOPY WITH PROPOFOL  N/A 01/12/2022   Procedure: COLONOSCOPY WITH PROPOFOL ;  Surgeon: Marnee Sink, MD;  Location: [facility] END[facility];  Service: Endoscopy;  Laterality: N/A;   CYSTOSCOPY WITH RETROGRADE PYELOGRAM, URETEROSCOPY AND STENT PLACEMENT Left  01/13/2023   Procedure: CYSTOSCOPY WITH RETROGRADE PYELOGRAM, URETEROSCOPY AND STENT PLACEMENT;  Surgeon: Geraline Knapp, MD;  Location: [facility] OR[facility];  Service: Urology;  Laterality: Left;   CYSTOSCOPY/URETEROSCOPY/HOLMIUM LASER/STENT PLACEMENT Left 02/08/2023   Procedure: CYSTOSCOPY/URETEROSCOPY/HOLMIUM LASER/STENT EXCHANGE;  Surgeon: Geraline Knapp, MD;  Location: [facility] OR[facility];  Service: Urology;  Laterality: Left;   ESOPHAGOGASTRODUODENOSCOPY N/A 11/02/2022   Procedure: ESOPHAGOGASTRODUODENOSCOPY (EGD);  Surgeon: Marnee Sink, MD;  Location: [facility] ENDOSCOPY;  Service: Endoscopy;  Laterality: N/A;   EYE SURGERY     GIVENS CAPSULE STUDY N/A 12/22/2022   Procedure: GIVENS CAPSULE STUDY;  Surgeon: Marnee Sink, MD;  Location[facility] ENDOSCOPY;  Service: Endoscopy;  Laterality: N/A;   TONSILLECTOMY     TONSILLECTOMY AND ADENOIDECTOMY  1951   WISDOM TOOTH EXTRACTION  1970     Social History   Tobacco Use   Smoking status: Never   Smokeless tobacco: Never  Vaping Use   Vaping status: Never Used  Substance Use Topics   Alcohol  use: No   Drug use: No       Family History  Problem Relation Age of Onset   Cancer Sister        Has had Breast Cancer and is in remission   Breast cancer Sister 85   Congenital heart disease Son      Allergies  Allergen Reactions   Sulfa Antibiotics Hives   Amoxicillin  Rash   Penicillins Rash    REVIEW OF SYSTEMS (Negative unless checked)   Constitutional: [] Weight loss  [] Fever  [] Chills Cardiac: [] Chest pain   [] Chest pressure   [] Palpitations   [] Shortness of breath when laying flat   [] Shortness of breath at rest   [] Shortness of breath with exertion. Vascular:  [] Pain in legs with walking   [] Pain in legs at rest   [] Pain in legs when laying flat   [] Claudication   [] Pain in feet when walking  [] Pain in feet at rest  [] Pain in feet when laying flat   [] History of DVT   [] Phlebitis   [] Swelling in legs   [] Varicose veins   [] Non-healing  ulcers Pulmonary:   [] Uses home oxygen   [] Productive cough   [] Hemoptysis   [] Wheeze  [] COPD   [] Asthma Neurologic:  [] Dizziness  [] Blackouts   [] Seizures   [] History of stroke   [] History of TIA  [] Aphasia   [] Temporary blindness   [] Dysphagia   [] Weakness or numbness in arms   [x] Weakness or numbness in legs Musculoskeletal:  [] Arthritis   [] Joint swelling   [] Joint pain   [] Low back pain Hematologic:  [] Easy bruising  [] Easy bleeding   [] Hypercoagulable state   [x] Anemic  [] Hepatitis Gastrointestinal:  [] Blood in stool   [] Vomiting blood  [x] Gastroesophageal reflux/heartburn   [] Abdominal pain Genitourinary:  [] Chronic kidney disease   []   Difficult urination  [] Frequent urination  [] Burning with urination   [x] Hematuria Skin:  [] Rashes   [] Ulcers   [] Wounds Psychological:  [] History of anxiety   [x]  History of major depression.  Physical Examination  BP (!) 159/80   Pulse 68   Resp 18   Ht 5\' 5"  (1.651 m)   Wt 121 lb (54.9 kg)   BMI 20.14 kg/m  Gen:  WD/WN, NAD. Appears younger than stated age. Head: [place]/AT, No temporalis wasting. Ear/Nose/Throat: Hearing grossly intact, nares w/o erythema or drainage Eyes: Conjunctiva clear. Sclera non-icteric Neck: Supple.  Trachea midline Pulmonary:  Good air movement, no use of accessory muscles.  Cardiac: RRR, no JVD Vascular:  Vessel Right Left  Radial Palpable Palpable       Musculoskeletal: M/S 5/5 throughout.  No deformity or atrophy. No edema. Neurologic: Sensation grossly intact in extremities.  Symmetrical.  Speech is fluent.  Psychiatric: Judgment intact, Mood & affect appropriate for pt's clinical situation. Dermatologic: No rashes or ulcers noted.  No cellulitis or open wounds.      Labs No results found for this or any previous visit (from the past 2160 hours).  Radiology CT ANGIO CHEST AORTA W/CM & OR WO/CM Result Date: 03/28/2024 CLINICAL DATA:  Thoracic aortic disease, preop planning EXAM: CT ANGIOGRAPHY CHEST WITH  CONTRAST TECHNIQUE: Multidetector CT imaging of the chest was performed using the standard protocol during bolus administration of intravenous contrast. Multiplanar CT image reconstructions and MIPs were obtained to evaluate the vascular anatomy. RADIATION DOSE REDUCTION: This exam was performed according to the departmental dose-optimization program which includes automated exposure control, adjustment of the mA and/or kV according to patient size and/or use of iterative reconstruction technique. CONTRAST:  75mL OMNIPAQUE  IOHEXOL  350 MG/ML SOLN COMPARISON:  CT chest August 17, 2023 FINDINGS: Cardiovascular: Comparison with prior examination no change in the size or appearance of the ascending aorta without evidence of dissection or aneurysms. Subtle coronary artery calcifications. No pericardial effusions Aortic arch normal. There is aneurysmal dilatation of the proximal descending thoracic aorta measuring 3.4 by 3.3 cm with no significant change in the appearance of the descending thoracic aorta measuring 4.3 by 3.6 cm with irregularity along the posterior wall measuring about 3.3 Cm in longitudinal diameter. Again this correlates with the short-segment dissection versus large penetrating ulcer. Proximal abdominal aorta unremarkable. The thoracic aorta proximal to the area of abnormality measures 3.3 x 3 cm and distal to the area of abnormality measures 3.8 by 3.6 cm. Mediastinum/Nodes: No enlarged mediastinal, hilar, or axillary lymph nodes. Thyroid  gland, trachea, and esophagus demonstrate no significant findings. Lungs/Pleura: Mild centrilobular emphysema without infiltrates or consolidations. A 10 x 8 mm nodule of the left lower lobe is noted medial segment of the left lower lobe, not seen on the prior examination two thousand twenty-four or 2023. However, there is significant differences in the level of inspiratory volumes between this 3 examinations with the highest inspiratory volumes of the lower lobes on  the most recent study. Lung-RADS 4A, suspicious. Follow up low-dose chest CT without contrast in 3 months (please use the following order, "CT CHEST LCS NODULE FOLLOW-UP W/O CM") is recommended. Alternatively, PET may be considered when there is a solid component 8mm or larger. Upper Abdomen: No acute abnormality. Musculoskeletal: No chest wall abnormality. No acute or significant osseous findings. Review of the MIP images confirms the above findings. IMPRESSION: *No change in the size or appearance of the ascending aorta without evidence of dissection or aneurysms. *No change in  the appearance of the proximal descending thoracic aorta with irregularity along the posterior wall measuring about 3.3 cm in longitudinal diameter. Again this correlates with the short-segment dissection versus large penetrating ulcer. *A 10 x 8 mm nodule of the left lower lobe is noted medial segment of the left lower lobe, not seen on the prior examination two thousand twenty-four or 2023. However, there is significant differences in the level of inspiratory volumes between this 3 examinations with the highest inspiratory volumes of the lower lobes on the most recent study. Lung-RADS 4A, suspicious. Follow up low-dose chest CT without contrast in 3 months (please use the following order, "CT CHEST LCS NODULE FOLLOW-UP W/O CM") is recommended. Alternatively, PET may be considered when there is a solid component 8mm or larger. *Mild centrilobular emphysema without infiltrates or consolidations. Electronically Signed   By: Fredrich Jefferson M.D.   On: 03/28/2024 15:06    Assessment/Plan  Thoracic aortic aneurysm without rupture (HCC) I have independently reviewed her recent CT angiogram of the chest.  There is no significant change to the chronic appearing dissection/penetrating ulcers of the mid and distal descending thoracic aorta with mild aneurysmal degeneration which has not changed from previous.  This still measures just below 5 cm  in maximal diameter by my review.  Of note, there was a pulmonary nodule commented on by the official report which appeared different than on previous studies. No role for intervention as this has remained stable.  I think we can go to checking this annually at this point.  I have messaged her primary care physician about the nodule to see if any further workup may be needed going forward.  Benign essential HTN blood pressure control important in reducing the progression of atherosclerotic disease and aneurysmal growth. On appropriate oral medications.     HLD (hyperlipidemia) lipid control important in reducing the progression of atherosclerotic disease. Continue statin therapy  Mikki Alexander, MD  04/18/2024 10:28 AM    This note was created with Dragon medical transcription system.  Any errors from dictation are purely unintentional

## 2024-04-18 NOTE — Assessment & Plan Note (Signed)
 I have independently reviewed her recent CT angiogram of the chest.  There is no significant change to the chronic appearing dissection/penetrating ulcers of the mid and distal descending thoracic aorta with mild aneurysmal degeneration which has not changed from previous.  This still measures just below 5 cm in maximal diameter by my review.  Of note, there was a pulmonary nodule commented on by the official report which appeared different than on previous studies. No role for intervention as this has remained stable.  I think we can go to checking this annually at this point.  I have messaged her primary care physician about the nodule to see if any further workup may be needed going forward.

## 2024-04-23 ENCOUNTER — Ambulatory Visit: Payer: Medicare Other

## 2024-04-23 DIAGNOSIS — R2681 Unsteadiness on feet: Secondary | ICD-10-CM

## 2024-04-23 DIAGNOSIS — M6281 Muscle weakness (generalized): Secondary | ICD-10-CM | POA: Diagnosis not present

## 2024-04-23 DIAGNOSIS — R262 Difficulty in walking, not elsewhere classified: Secondary | ICD-10-CM

## 2024-04-23 DIAGNOSIS — R296 Repeated falls: Secondary | ICD-10-CM

## 2024-04-23 NOTE — Therapy (Signed)
 [facility] at [facility] [address] [place], [place], [address] Phone: [phone]   Fax:  [phone]  Outpatient Physical Therapy Treatment/Discharge   Patient Details  Name: Tara Marquez MRN: [id number] Date of Birth: Oct 27, 1941 Referring Provider (PT): Gustavo Leigh MD (PCP)   Encounter Date: 04/23/2024   PT End of Session - 04/23/24 0934     Visit Number 47    Number of Visits 55    Date for PT Re-Evaluation 04/23/24    Authorization Type Medicare A&B primary with BCBS supplemental secondary    Progress Note Due on Visit 50    PT Start Time 0930    PT Stop Time 1010    PT Time Calculation (min) 40 min    Equipment Utilized During Treatment Gait belt    Activity Tolerance Patient tolerated treatment well    Behavior During Therapy WFL for tasks assessed/performed              Past Medical History:  Diagnosis Date   Acute pyelonephritis    Adaptive colitis    Anemia    Aortic atherosclerosis (HCC)    Atrial fibrillation and flutter (HCC)    a.) CHA2DS2VASc = 5 (age x 2, sex, HTN, vascular disease history);  b.) rate/rhythm maintained on oral amiodarone  + metoprolol  succinate; chronically anticoagulated with dose reduced apixaban    CAD (coronary artery disease)    Depression    Descending thoracic aortic aneurysm (HCC)    a.) CT chest 09/29/2022: 4.9 cm; b.) CT abd 01/13/2023: 4.9 cm   Frequent PVCs    GERD (gastroesophageal reflux disease)    Hepatic steatosis    History of 2019 novel coronavirus disease (COVID-19) 12/04/2021   Hyperlipidemia    Hypertension    Hypokalemia    Hypothyroidism    Long term current use of amiodarone     Long term current use of anticoagulant    a.) dose reduced apixaban    Nephrolithiasis    Osteopenia    Polymyalgia rheumatica (HCC)    Skin cancer    Splenomegaly    Temporal arteritis (HCC)    Vitamin D  deficiency     Past Surgical History:  Procedure Laterality  Date   ABDOMINAL HYSTERECTOMY  03/1971   Status Post Hysterectomy   CATARACT EXTRACTION Left    COLONOSCOPY     2010   COLONOSCOPY WITH PROPOFOL  N/A 01/12/2022   Procedure: COLONOSCOPY WITH PROPOFOL ;  Surgeon: Marnee Sink, MD;  Location: [facility] ENDOSCOPY;  Service: Endoscopy;  Laterality: N/A;   CYSTOSCOPY WITH RETROGRADE PYELOGRAM, URETEROSCOPY AND STENT PLACEMENT Left 01/13/2023   Procedure: CYSTOSCOPY WITH RETROGRADE PYELOGRAM, URETEROSCOPY AND STENT PLACEMENT;  Surgeon: Geraline Knapp, MD;  Location: [facility] ORS;  Service: Urology;  Laterality: Left;   CYSTOSCOPY/URETEROSCOPY/HOLMIUM LASER/STENT PLACEMENT Left 02/08/2023   Procedure: CYSTOSCOPY/URETEROSCOPY/HOLMIUM LASER/STENT EXCHANGE;  Surgeon: Geraline Knapp, MD;  Location: [facility] ORS;  Service: Urology;  Laterality: Left;   ESOPHAGOGASTRODUODENOSCOPY N/A 11/02/2022   Procedure: ESOPHAGOGASTRODUODENOSCOPY (EGD);  Surgeon: Marnee Sink, MD;  Location: [facility] ENDOSCOPY;  Service: Endoscopy;  Laterality: N/A;   EYE SURGERY     GIVENS CAPSULE STUDY N/A 12/22/2022   Procedure: GIVENS CAPSULE STUDY;  Surgeon: Marnee Sink, MD;  Location: [facility] ENDOSCOPY;  Service: Endoscopy;  Laterality: N/A;   TONSILLECTOMY     TONSILLECTOMY AND ADENOIDECTOMY  1951   WISDOM TOOTH EXTRACTION  1970       Subjective Assessment     Subjective Pt doing generally well, is ready for  DC today as planned. Pt reports she still has paper handouts from author last session.    Pertinent History Tara Marquez is an 82yoF who is referred by PCP due to gradual progressive weakness since hospital admission in February 2024. Pt presents to PT eval with husband. Pt reports being in hospital for ~ a week in Feb 2024 with urinary obstruction, AKI, felt week initially but declined HHPT; pt/husband say progressively more weak over subsequent months, more difficulty with walking, husband has now asked her to use a cane. Pt reports 5 falls, 4 of which were outside on natural surface 5th  trying to clear a threshold (posterior fall buttock, back, hit head). Does get dizzy, but not frequently, not related to falls often.    How long can you sit comfortably? Not a limitation    How long can you stand comfortably? Not a limitation    How long can you walk comfortably? Mostly walks household distances, but can tolerate occasional store trips with buggy support.    Currently in Pain? No/denies              Objective measurements completed on examination: See above findings.    TODAY'S TREATMENT: 04/23/24  Berg balance test; 5xSTS    CGA/supervision with all interventions today. As well as therapeutic rest breaks between PT interventions.     [facility] PT Assessment - 04/23/24 0001       Berg Balance Test   Sit to Stand Able to stand without using hands and stabilize independently    Standing Unsupported Able to stand safely 2 minutes    Sitting with Back Unsupported but Feet Supported on Floor or Stool Able to sit safely and securely 2 minutes    Stand to Sit Sits safely with minimal use of hands    Transfers Able to transfer safely, minor use of hands    Standing Unsupported with Eyes Closed Able to stand 10 seconds safely    Standing Unsupported with Feet Together Able to place feet together independently and stand 1 minute safely    From Standing, Reach Forward with Outstretched Arm Can reach confidently >25 cm (10")    From Standing Position, Pick up Object from Floor Able to pick up shoe safely and easily    From Standing Position, Turn to Look Behind Over each Shoulder Looks behind from both sides and weight shifts well    Turn 360 Degrees Able to turn 360 degrees safely in 4 seconds or less    Standing Unsupported, Alternately Place Feet on Step/Stool Able to complete >2 steps/needs minimal assist    Standing Unsupported, One Foot in Front Able to take small step independently and hold 30 seconds    Standing on One Leg Tries to lift leg/unable to hold 3 seconds  but remains standing independently    Total Score 48              PT Education -     Education Details Exercise technique- VC for safety    Safety in management of unlevel sidewalk to imrprove step height                      PT Short Term Goals -        PT SHORT TERM GOAL #1   Title compliant with balance HEP    Time 4    Period Weeks    Status Achieved    Target Date 08/12/23  PT SHORT TERM GOAL #2   Title Improved overground gait speed over 564ft to improve ability to access limited community distances safely.    Baseline eval: 332ft at 0.88m/s; 651ft s device 0.32m/s.   Time 4    Period Weeks    Status Achieved     Target Date 08/12/23               PT Long Term Goals -       PT LONG TERM GOAL #1   Title FOTO score improvement >12 points to indicate improve confidence in balance.    Baseline 07/19/2023=43; 08/22/23: 51; 09/08/2023 =will assess next visit; 10/22= 52; 12/01/2023= 57   Time 12   Period Weeks    Status MET   Target Date 12/01/2023     PT LONG TERM GOAL #2   Title Improvement in standardized balance assessment by MCID or greater.    Baseline 07/19/2023= BERG- 35/56; 08/22/23: 44   Time 8    Period Weeks    Status Achieved     Target Date 09/09/23      PT LONG TERM GOAL #3   Title Pt to demonstrate 5xSTS hands free from standard height surface to indicated improved BLE power.    Baseline 07/19/2023= 31.07 sec without UE Support; 08/22/23: 19.15sec (hands on thighs); 09/08/2023= 16.42 sec; 12/01/2023= 14.42 sec without UE support; 04/23/24:14.5sec hands free, no LOB     Time 8    Period Weeks    Status MET   Target Date 09/09/23      PT LONG TERM GOAL #4   Title Pt to demonstrate performance of 2032ft AMB >0.27m/s without increased pain and without LOB at modI level or less to restore ability to access comunity distances for IADL.    Baseline 08/22/23: 614ft in 4:02 (0.7m/s); 08/25/2023= 1200 in . 9/12= 0.62 m/s; 12/01/2023= will  reassess next visit 02/27/2024: 1544ft in 11:45min with SPC; 04/16/2024= > 2000 feet in less than 12 min= 0.85 m/s Holding onto [facility]   Time 8    Period Weeks    Status MET    Target Date 04/23/2024          PT LONG TERM GOAL #5  Title Pt will improve BERG by at least 3 points in order to demonstrate clinically significant improvement in balance.   Baseline 09/08/2023 = 46/56; 12/01/2023= 47/56 02/27/2024: 48; 04/23/24: 48   Time 12  Period Weeks   Status Not met   Target Date 04/23/2024      PT LONG TERM GOAL #6  Title  Pt will decrease 5TSTS by at least 3 seconds in order to demonstrate clinically significant improvement in LE strength.  Baseline 09/08/2023= 16.42 sec 12/01/2023= 14.42 sec without UE support  02/27/2024: 14.74 sec without UE support;04/23/24:14.25sec hands free, no LOB  Time 12  Period Weeks   Status  Not Met   Target Date 04/23/2024        Plan -     Clinical Impression Statement  Finished final reassessment; Proceeded with planned DC for today. Pt has made good progress toward goals of care in general, and despite some plateau of progress toward end, has demonstrated the ability to maintain these gains. HEP updated last session. Time is taken to answer all questions. Pt DC from our services at this time.    Personal Factors and Comorbidities Age;Fitness;Behavior Pattern;Past/Current Experience    Examination-Activity Limitations Locomotion Level;Transfers;Lift;Stairs    Examination-Participation Restrictions Meal Prep;Cleaning;Community Activity;Driving;Interpersonal Relationship;[employer] Work  Stability/Clinical Decision Making Stable/Uncomplicated    Clinical Decision Making Moderate    Rehab Potential Good    PT Frequency 1-2x / week    PT Duration 12 weeks    PT Treatment/Interventions ADLs/Self Care Home Management;Gait training;Stair training;Functional mobility training;Neuromuscular re-education;Balance training;Therapeutic exercise;Therapeutic  activities;Passive range of motion;Manual techniques    PT Next Visit Plan Reassess all goals and plan for discharge to HEP next visit.       PT Home Exercise Plan asked at eval to check and log BP twice daily,   Access Code: [email or web address] URL: [email or web address] Date: 03/05/2024 Prepared by: Ferrell Hu  Exercises - Sit to Stand with Arms Crossed  - 3 x weekly - 3 sets - 10 reps - Side Stepping with Resistance at Ankles and Counter Support  - 3 x weekly - 3 sets - Forward Lunge with Counter Support  - 3 x weekly - 3 sets - 10 reps - Standing Hip Extension with Counter Support  - 3 x weekly - 3 sets - 10 reps - Standing Heel Raises  - 3 x weekly - 3 sets - 10 reps - Standing Single Leg Stance with Counter Support  - 3 x weekly - 5-10 sets - as long as possible hold - Tandem Stance  - 3 x weekly - 3-5 sets - up to 30 sec hold - March in Place  - 3 x weekly - 3 sets - 10 reps  Access Code: [email or web address] URL: [email or web address] Date: 07/21/2023 Prepared by: Aurora Lees  Exercises - Sit to Stand with Armchair  - 1 x daily - 5 x weekly - 2 sets - 6 reps - Standing Hip Abduction with Counter Support  - 1 x daily - 7 x weekly - 3 sets - 10 reps - Seated Hip Abduction with Resistance  - 1 x daily - 7 x weekly - 3 sets - 10 reps - Seated March with Resistance  - 1 x daily - 7 x weekly - 3 sets - 10 reps   Consulted and Agree with Plan of Care Patient;Family member/caregiver             Patient will benefit from skilled therapeutic intervention in order to improve the following deficits and impairments:   balance impairments, endurance deficits, reduced strength,   Visit Diagnosis: Muscle weakness (generalized)  Difficulty in walking, not elsewhere classified  Unsteadiness on feet  Repeated falls     Problem List Patient Active Problem List   Diagnosis Date Noted   Obstructive nephropathy 01/13/2023   Acute pyelonephritis 01/13/2023    AKI (acute kidney injury) (HCC) 01/13/2023   Hypokalemia 01/13/2023   Diarrhea 01/13/2023   Thoracic aortic aneurysm without rupture (HCC) 11/09/2022   Hematuria 11/05/2022   Polyp of esophagus 11/02/2022   Iron deficiency anemia 07/28/2022   Positive colorectal cancer screening using Cologuard test    Abnormal ECG 03/31/2021   Acute non-recurrent sinusitis 11/30/2020   Acute otitis media 11/30/2020   Benign essential HTN 10/31/2018   Frequent PVCs 09/08/2017   A-fib (HCC) 08/27/2015   Clinical depression 08/27/2015   Blood pressure elevated 08/27/2015   Polypharmacy 08/27/2015   Esophagitis, reflux 08/27/2015   Fatigue 08/27/2015   Acid reflux 08/27/2015   HLD (hyperlipidemia) 08/27/2015   Adult hypothyroidism 08/27/2015   Adaptive colitis 08/27/2015   Malaise and fatigue 08/27/2015   Mild major depression (HCC) 08/27/2015   Polymyalgia rheumatica (HCC) 08/27/2015   Cranial arteritis (HCC) 08/27/2015   Avitaminosis D 08/27/2015  9:37 AM, 04/23/24 Dawn Eth, PT, DPT Physical Therapist - [facility]  Outpatient Physical Therapy- Main Campus [phone]     9:37 AM 04/23/24

## 2024-04-30 ENCOUNTER — Ambulatory Visit: Payer: Medicare Other

## 2024-05-07 ENCOUNTER — Ambulatory Visit: Payer: Medicare Other

## 2024-05-14 ENCOUNTER — Ambulatory Visit: Payer: Medicare Other

## 2024-05-15 ENCOUNTER — Encounter (INDEPENDENT_AMBULATORY_CARE_PROVIDER_SITE_OTHER): Payer: Self-pay

## 2024-05-22 ENCOUNTER — Ambulatory Visit: Payer: Medicare Other

## 2024-05-28 ENCOUNTER — Ambulatory Visit: Payer: Medicare Other

## 2024-06-04 ENCOUNTER — Ambulatory Visit: Payer: Medicare Other

## 2024-06-11 ENCOUNTER — Ambulatory Visit: Payer: Medicare Other

## 2024-06-18 ENCOUNTER — Ambulatory Visit: Payer: Medicare Other

## 2024-06-25 ENCOUNTER — Ambulatory Visit: Payer: Medicare Other

## 2024-07-02 ENCOUNTER — Ambulatory Visit: Payer: Medicare Other

## 2024-07-09 ENCOUNTER — Ambulatory Visit: Payer: Medicare Other

## 2024-07-16 ENCOUNTER — Ambulatory Visit: Payer: Medicare Other

## 2024-07-23 ENCOUNTER — Ambulatory Visit: Payer: Medicare Other

## 2024-09-10 ENCOUNTER — Other Ambulatory Visit: Payer: Self-pay | Admitting: *Deleted

## 2024-09-10 DIAGNOSIS — N200 Calculus of kidney: Secondary | ICD-10-CM

## 2024-09-12 ENCOUNTER — Ambulatory Visit (INDEPENDENT_AMBULATORY_CARE_PROVIDER_SITE_OTHER): Payer: Self-pay | Admitting: Urology

## 2024-09-12 ENCOUNTER — Encounter: Payer: Self-pay | Admitting: Urology

## 2024-09-12 ENCOUNTER — Ambulatory Visit
Admission: RE | Admit: 2024-09-12 | Discharge: 2024-09-12 | Disposition: A | Discharge status: Home / Self Care | Source: Ambulatory Visit | Attending: Urology | Admitting: Urology

## 2024-09-12 VITALS — BP 119/59 | HR 66 | Ht 65.0 in | Wt 131.0 lb

## 2024-09-12 DIAGNOSIS — N200 Calculus of kidney: Secondary | ICD-10-CM | POA: Insufficient documentation

## 2024-09-12 NOTE — Progress Notes (Signed)
 09/12/2024 9:37 AM   Tara Marquez 30-Jul-1941 [id number]  Referring provider: Bertrum Charlie CROME, MD [address] [place],  [place] [address]  Chief Complaint  Patient presents with   Nephrolithiasis   Urologic history:  1. Nephrolithiasis Left ureteral stent placement for obstructing 7mm left distal ureteral calculus with sepsis 01/13/23.  Ureteroscopic stone removal 02/08/23. Stone analysis CaOxmono/CaOxdi 80/20.  2mm non-obstructing right renal calculus on CT.    HPI: Tara Marquez is a 83 y.o. female  No problems since last year's visit Denies flank, abdominal, pelvic pain No bothersome LUTS or gross hematuria   PMH: Past Medical History:  Diagnosis Date   Acute pyelonephritis    Adaptive colitis    Anemia    Aortic atherosclerosis (HCC)    Atrial fibrillation and flutter (HCC)    a.) CHA2DS2VASc = 5 (age x 2, sex, HTN, vascular disease history);  b.) rate/rhythm maintained on oral amiodarone  + metoprolol  succinate; chronically anticoagulated with dose reduced apixaban    CAD (coronary artery disease)    Depression    Descending thoracic aortic aneurysm (HCC)    a.) CT chest 09/29/2022: 4.9 cm; b.) CT abd 01/13/2023: 4.9 cm   Frequent PVCs    GERD (gastroesophageal reflux disease)    Hepatic steatosis    History of 2019 novel coronavirus disease (COVID-19) 12/04/2021   Hyperlipidemia    Hypertension    Hypokalemia    Hypothyroidism    Long term current use of amiodarone     Long term current use of anticoagulant    a.) dose reduced apixaban    Nephrolithiasis    Osteopenia    Polymyalgia rheumatica (HCC)    Skin cancer    Splenomegaly    Temporal arteritis (HCC)    Vitamin D  deficiency     Surgical History: Past Surgical History:  Procedure Laterality Date   ABDOMINAL HYSTERECTOMY  03/1971   Status Post Hysterectomy   CATARACT EXTRACTION Left    COLONOSCOPY     2010   COLONOSCOPY WITH PROPOFOL  N/A 01/12/2022   Procedure: COLONOSCOPY WITH  PROPOFOL ;  Surgeon: Jinny Carmine, MD;  Location: [facility] ENDOSCOPY;  Service: Endoscopy;  Laterality: N/A;   CYSTOSCOPY WITH RETROGRADE PYELOGRAM, URETEROSCOPY AND STENT PLACEMENT Left 01/13/2023   Procedure: CYSTOSCOPY WITH RETROGRADE PYELOGRAM, URETEROSCOPY AND STENT PLACEMENT;  Surgeon: Twylla Glendia BROCKS, MD;  Location: [facility] ORS;  Service: Urology;  Laterality: Left;   CYSTOSCOPY/URETEROSCOPY/HOLMIUM LASER/STENT PLACEMENT Left 02/08/2023   Procedure: CYSTOSCOPY/URETEROSCOPY/HOLMIUM LASER/STENT EXCHANGE;  Surgeon: Twylla Glendia BROCKS, MD;  Location: [facility] ORS;  Service: Urology;  Laterality: Left;   ESOPHAGOGASTRODUODENOSCOPY N/A 11/02/2022   Procedure: ESOPHAGOGASTRODUODENOSCOPY (EGD);  Surgeon: Jinny Carmine, MD;  Location: [facility] ENDOSCOPY;  Service: Endoscopy;  Laterality: N/A;   EYE SURGERY     GIVENS CAPSULE STUDY N/A 12/22/2022   Procedure: GIVENS CAPSULE STUDY;  Surgeon: Jinny Carmine, MD;  Location: [facility] ENDOSCOPY;  Service: Endoscopy;  Laterality: N/A;   TONSILLECTOMY     TONSILLECTOMY AND ADENOIDECTOMY  1951   WISDOM TOOTH EXTRACTION  1970    Home Medications:  Allergies as of 09/12/2024       Reactions   Sulfa Antibiotics Hives   Amoxicillin  Rash   Penicillins Rash        Medication List        Accurate as of September 12, 2024  9:37 AM. If you have any questions, ask your nurse or doctor.          STOP taking these medications    amiodarone   200 MG tablet Commonly known as: Pacerone  Stopped by: Glendia JAYSON Barba   metoprolol  succinate 25 MG 24 hr tablet Commonly known as: TOPROL -XL Stopped by: Glendia JAYSON Barba       TAKE these medications    apixaban  2.5 MG Tabs tablet Commonly known as: ELIQUIS  Take 1 tablet (2.5 mg total) by mouth 2 (two) times daily.   atorvastatin  40 MG tablet Commonly known as: LIPITOR Take 1 tablet (40 mg total) by mouth daily at 6 PM. What changed: when to take this   levothyroxine  75 MCG tablet Commonly known as: SYNTHROID  Take 1  tablet (75 mcg total) by mouth daily. What changed: when to take this   lisinopril 10 MG tablet Commonly known as: ZESTRIL Take 10 mg by mouth daily.   nitroGLYCERIN  0.4 MG SL tablet Commonly known as: NITROSTAT  Place 1 tablet (0.4 mg total) under the tongue every 5 (five) minutes as needed for chest pain.   ondansetron  4 MG tablet Commonly known as: ZOFRAN  Take 4 mg by mouth every morning.   sertraline  100 MG tablet Commonly known as: ZOLOFT  TAKE 1 & 1/2 (ONE & ONE-HALF) TABLETS BY MOUTH ONCE DAILY NEED  TO  SCHEDULE  AN  APPOINTMENT What changed:  how much to take how to take this when to take this        Allergies:  Allergies  Allergen Reactions   Sulfa Antibiotics Hives   Amoxicillin  Rash   Penicillins Rash    Family History: Family History  Problem Relation Age of Onset   Cancer Sister        Has had Breast Cancer and is in remission   Breast cancer Sister 60   Congenital heart disease Son     Social History:  reports that she has never smoked. She has never used smokeless tobacco. She reports that she does not drink alcohol  and does not use drugs.   Physical Exam: BP (!) 119/59   Pulse 66   Ht 5' 5 (1.651 m)   Wt 131 lb (59.4 kg)   BMI 21.80 kg/m   Constitutional:  Alert, No acute distress. HEENT: [place] AT Respiratory: Normal respiratory effort, no increased work of breathing. Psychiatric: Normal mood and affect.   Assessment & Plan:    1.  Right nephrolithiasis Status post left ureteroscopic stone removal 01/2023 Small nonobstructing right renal calculus seen on CT which has not been visualized on KUB She did not get her KUB prior to her appointment and will go to radiology afterwards.  She will be contacted with the results.  If no significant change she may return prn   Glendia JAYSON Barba, MD  [facility] [address] [place], [place] [address] [phone]

## 2024-09-14 ENCOUNTER — Ambulatory Visit: Payer: Self-pay | Admitting: Urology
